# Patient Record
Sex: Female | Born: 1945 | Race: Black or African American | Hispanic: No | Smoking: Never smoker
Health system: Southern US, Community
[De-identification: ages and names within clinical notes are randomized; demographics above are authoritative.]

## PROBLEM LIST (undated history)

## (undated) DIAGNOSIS — N390 Urinary tract infection, site not specified: Secondary | ICD-10-CM

## (undated) DIAGNOSIS — R0989 Other specified symptoms and signs involving the circulatory and respiratory systems: Secondary | ICD-10-CM

## (undated) DIAGNOSIS — Z85528 Personal history of other malignant neoplasm of kidney: Secondary | ICD-10-CM

## (undated) DIAGNOSIS — M5126 Other intervertebral disc displacement, lumbar region: Secondary | ICD-10-CM

## (undated) DIAGNOSIS — M797 Fibromyalgia: Secondary | ICD-10-CM

## (undated) DIAGNOSIS — D649 Anemia, unspecified: Secondary | ICD-10-CM

## (undated) DIAGNOSIS — E669 Obesity, unspecified: Secondary | ICD-10-CM

## (undated) DIAGNOSIS — F319 Bipolar disorder, unspecified: Secondary | ICD-10-CM

## (undated) DIAGNOSIS — F209 Schizophrenia, unspecified: Secondary | ICD-10-CM

## (undated) DIAGNOSIS — C801 Malignant (primary) neoplasm, unspecified: Secondary | ICD-10-CM

## (undated) DIAGNOSIS — I5181 Takotsubo syndrome: Secondary | ICD-10-CM

## (undated) DIAGNOSIS — R011 Cardiac murmur, unspecified: Secondary | ICD-10-CM

## (undated) DIAGNOSIS — N200 Calculus of kidney: Secondary | ICD-10-CM

## (undated) DIAGNOSIS — F29 Unspecified psychosis not due to a substance or known physiological condition: Secondary | ICD-10-CM

## (undated) DIAGNOSIS — I10 Essential (primary) hypertension: Secondary | ICD-10-CM

## (undated) DIAGNOSIS — Z905 Acquired absence of kidney: Secondary | ICD-10-CM

## (undated) DIAGNOSIS — M1990 Unspecified osteoarthritis, unspecified site: Secondary | ICD-10-CM

## (undated) DIAGNOSIS — E785 Hyperlipidemia, unspecified: Secondary | ICD-10-CM

## (undated) DIAGNOSIS — K219 Gastro-esophageal reflux disease without esophagitis: Secondary | ICD-10-CM

## (undated) HISTORY — DX: Anemia, unspecified: D64.9

## (undated) HISTORY — DX: Essential (primary) hypertension: I10

## (undated) HISTORY — DX: Unspecified osteoarthritis, unspecified site: M19.90

## (undated) HISTORY — DX: Personal history of other malignant neoplasm of kidney: Z85.528

## (undated) HISTORY — PX: BREAST LUMPECTOMY: SHX2

## (undated) HISTORY — DX: Fibromyalgia: M79.7

## (undated) HISTORY — DX: Hyperlipidemia, unspecified: E78.5

## (undated) HISTORY — DX: Takotsubo syndrome: I51.81

## (undated) HISTORY — PX: CYSTECTOMY: SUR359

## (undated) HISTORY — DX: Obesity, unspecified: E66.9

## (undated) HISTORY — DX: Other specified symptoms and signs involving the circulatory and respiratory systems: R09.89

---

## 1989-02-16 HISTORY — PX: NEPHRECTOMY: SHX65

## 2003-06-19 HISTORY — PX: TOTAL KNEE ARTHROPLASTY: SHX125

## 2005-03-05 ENCOUNTER — Ambulatory Visit: Payer: Self-pay | Admitting: Internal Medicine

## 2005-03-07 ENCOUNTER — Ambulatory Visit: Payer: Self-pay | Admitting: Internal Medicine

## 2005-03-20 ENCOUNTER — Ambulatory Visit: Payer: Self-pay | Admitting: Family Medicine

## 2005-03-27 ENCOUNTER — Emergency Department (HOSPITAL_COMMUNITY): Admission: EM | Admit: 2005-03-27 | Discharge: 2005-03-27 | Payer: Self-pay | Admitting: Emergency Medicine

## 2005-03-27 ENCOUNTER — Ambulatory Visit: Payer: Self-pay | Admitting: *Deleted

## 2005-03-30 ENCOUNTER — Ambulatory Visit: Payer: Self-pay | Admitting: Internal Medicine

## 2005-04-02 ENCOUNTER — Ambulatory Visit (HOSPITAL_COMMUNITY): Admission: RE | Admit: 2005-04-02 | Discharge: 2005-04-02 | Payer: Self-pay | Admitting: Internal Medicine

## 2005-04-26 ENCOUNTER — Ambulatory Visit: Payer: Self-pay | Admitting: Internal Medicine

## 2005-09-05 ENCOUNTER — Ambulatory Visit: Payer: Self-pay | Admitting: Internal Medicine

## 2005-09-14 ENCOUNTER — Ambulatory Visit: Payer: Self-pay | Admitting: Internal Medicine

## 2005-09-25 ENCOUNTER — Encounter: Admission: RE | Admit: 2005-09-25 | Discharge: 2005-09-25 | Payer: Self-pay | Admitting: Internal Medicine

## 2005-10-02 ENCOUNTER — Ambulatory Visit: Payer: Self-pay | Admitting: Family Medicine

## 2005-10-08 ENCOUNTER — Ambulatory Visit: Payer: Self-pay | Admitting: Internal Medicine

## 2005-10-10 ENCOUNTER — Ambulatory Visit: Payer: Self-pay | Admitting: Internal Medicine

## 2005-10-11 ENCOUNTER — Ambulatory Visit: Payer: Self-pay | Admitting: Internal Medicine

## 2005-10-18 ENCOUNTER — Ambulatory Visit: Payer: Self-pay | Admitting: Internal Medicine

## 2005-11-01 ENCOUNTER — Inpatient Hospital Stay (HOSPITAL_COMMUNITY): Admission: EM | Admit: 2005-11-01 | Discharge: 2005-11-03 | Payer: Self-pay | Admitting: Emergency Medicine

## 2005-11-08 ENCOUNTER — Ambulatory Visit: Payer: Self-pay | Admitting: Internal Medicine

## 2005-11-19 ENCOUNTER — Ambulatory Visit: Payer: Self-pay | Admitting: Internal Medicine

## 2005-11-23 ENCOUNTER — Ambulatory Visit: Payer: Self-pay | Admitting: Internal Medicine

## 2005-11-27 ENCOUNTER — Ambulatory Visit (HOSPITAL_COMMUNITY): Admission: RE | Admit: 2005-11-27 | Discharge: 2005-11-27 | Payer: Self-pay | Admitting: Internal Medicine

## 2005-12-07 ENCOUNTER — Ambulatory Visit: Payer: Self-pay | Admitting: Internal Medicine

## 2005-12-17 ENCOUNTER — Ambulatory Visit: Payer: Self-pay | Admitting: Internal Medicine

## 2005-12-18 ENCOUNTER — Encounter: Admission: RE | Admit: 2005-12-18 | Discharge: 2006-01-02 | Payer: Self-pay | Admitting: Internal Medicine

## 2005-12-21 ENCOUNTER — Ambulatory Visit (HOSPITAL_COMMUNITY): Admission: RE | Admit: 2005-12-21 | Discharge: 2005-12-21 | Payer: Self-pay | Admitting: Internal Medicine

## 2006-01-01 ENCOUNTER — Ambulatory Visit: Payer: Self-pay | Admitting: Internal Medicine

## 2006-01-04 ENCOUNTER — Other Ambulatory Visit: Admission: RE | Admit: 2006-01-04 | Discharge: 2006-01-04 | Payer: Self-pay | Admitting: Internal Medicine

## 2006-01-04 ENCOUNTER — Ambulatory Visit: Payer: Self-pay | Admitting: Internal Medicine

## 2006-01-04 ENCOUNTER — Ambulatory Visit (HOSPITAL_COMMUNITY): Admission: RE | Admit: 2006-01-04 | Discharge: 2006-01-04 | Payer: Self-pay | Admitting: Internal Medicine

## 2006-01-07 ENCOUNTER — Ambulatory Visit (HOSPITAL_COMMUNITY): Admission: RE | Admit: 2006-01-07 | Discharge: 2006-01-07 | Payer: Self-pay | Admitting: Internal Medicine

## 2006-01-15 ENCOUNTER — Ambulatory Visit: Payer: Self-pay | Admitting: Internal Medicine

## 2006-01-23 ENCOUNTER — Emergency Department (HOSPITAL_COMMUNITY): Admission: EM | Admit: 2006-01-23 | Discharge: 2006-01-23 | Payer: Self-pay | Admitting: Emergency Medicine

## 2006-06-04 ENCOUNTER — Encounter: Admission: RE | Admit: 2006-06-04 | Discharge: 2006-06-28 | Payer: Self-pay | Admitting: Orthopedic Surgery

## 2006-06-13 ENCOUNTER — Emergency Department (HOSPITAL_COMMUNITY): Admission: EM | Admit: 2006-06-13 | Discharge: 2006-06-13 | Payer: Self-pay | Admitting: Emergency Medicine

## 2006-07-12 ENCOUNTER — Emergency Department (HOSPITAL_COMMUNITY): Admission: EM | Admit: 2006-07-12 | Discharge: 2006-07-12 | Payer: Self-pay | Admitting: Emergency Medicine

## 2006-09-11 ENCOUNTER — Encounter
Admission: RE | Admit: 2006-09-11 | Discharge: 2006-09-27 | Payer: Self-pay | Admitting: Physical Medicine and Rehabilitation

## 2007-04-30 ENCOUNTER — Emergency Department (HOSPITAL_COMMUNITY): Admission: EM | Admit: 2007-04-30 | Discharge: 2007-04-30 | Payer: Self-pay | Admitting: *Deleted

## 2007-05-15 ENCOUNTER — Emergency Department (HOSPITAL_COMMUNITY): Admission: EM | Admit: 2007-05-15 | Discharge: 2007-05-15 | Payer: Self-pay | Admitting: Emergency Medicine

## 2008-06-18 HISTORY — PX: CARDIAC CATHETERIZATION: SHX172

## 2009-03-03 ENCOUNTER — Inpatient Hospital Stay (HOSPITAL_COMMUNITY): Admission: EM | Admit: 2009-03-03 | Discharge: 2009-03-08 | Payer: Self-pay | Admitting: Emergency Medicine

## 2009-03-03 ENCOUNTER — Ambulatory Visit: Payer: Self-pay | Admitting: Cardiology

## 2009-03-04 ENCOUNTER — Encounter: Payer: Self-pay | Admitting: Cardiology

## 2009-03-07 ENCOUNTER — Encounter: Payer: Self-pay | Admitting: Cardiology

## 2009-03-07 ENCOUNTER — Ambulatory Visit: Payer: Self-pay | Admitting: Surgery

## 2009-03-10 ENCOUNTER — Encounter (INDEPENDENT_AMBULATORY_CARE_PROVIDER_SITE_OTHER): Payer: Self-pay | Admitting: *Deleted

## 2009-03-21 ENCOUNTER — Telehealth: Payer: Self-pay | Admitting: Cardiology

## 2009-03-22 ENCOUNTER — Ambulatory Visit: Payer: Self-pay | Admitting: Cardiology

## 2009-03-22 ENCOUNTER — Ambulatory Visit: Payer: Self-pay

## 2009-03-24 LAB — CONVERTED CEMR LAB
Basophils Absolute: 0.1 10*3/uL (ref 0.0–0.1)
Basophils Relative: 0.9 % (ref 0.0–3.0)
Eosinophils Absolute: 0.4 10*3/uL (ref 0.0–0.7)
Eosinophils Relative: 6.5 % — ABNORMAL HIGH (ref 0.0–5.0)
HCT: 25.7 % — ABNORMAL LOW (ref 36.0–46.0)
Hemoglobin: 8.3 g/dL — ABNORMAL LOW (ref 12.0–15.0)
Lymphocytes Relative: 35.2 % (ref 12.0–46.0)
Lymphs Abs: 2 10*3/uL (ref 0.7–4.0)
MCHC: 32.2 g/dL (ref 30.0–36.0)
MCV: 86.6 fL (ref 78.0–100.0)
Monocytes Absolute: 0.4 10*3/uL (ref 0.1–1.0)
Monocytes Relative: 6.2 % (ref 3.0–12.0)
Neutro Abs: 2.9 10*3/uL (ref 1.4–7.7)
Neutrophils Relative %: 51.2 % (ref 43.0–77.0)
Platelets: 315 10*3/uL (ref 150.0–400.0)
RBC: 2.96 M/uL — ABNORMAL LOW (ref 3.87–5.11)
RDW: 13 % (ref 11.5–14.6)
WBC: 5.8 10*3/uL (ref 4.5–10.5)

## 2009-04-12 DIAGNOSIS — R079 Chest pain, unspecified: Secondary | ICD-10-CM | POA: Insufficient documentation

## 2009-04-12 DIAGNOSIS — M1990 Unspecified osteoarthritis, unspecified site: Secondary | ICD-10-CM | POA: Insufficient documentation

## 2009-04-12 DIAGNOSIS — IMO0001 Reserved for inherently not codable concepts without codable children: Secondary | ICD-10-CM | POA: Insufficient documentation

## 2009-04-12 DIAGNOSIS — I10 Essential (primary) hypertension: Secondary | ICD-10-CM | POA: Insufficient documentation

## 2009-04-12 DIAGNOSIS — E785 Hyperlipidemia, unspecified: Secondary | ICD-10-CM | POA: Insufficient documentation

## 2009-04-12 DIAGNOSIS — J45909 Unspecified asthma, uncomplicated: Secondary | ICD-10-CM | POA: Insufficient documentation

## 2009-04-13 ENCOUNTER — Ambulatory Visit: Payer: Self-pay | Admitting: Cardiology

## 2009-04-13 DIAGNOSIS — I428 Other cardiomyopathies: Secondary | ICD-10-CM

## 2009-04-13 LAB — CONVERTED CEMR LAB
Basophils Absolute: 0 10*3/uL (ref 0.0–0.1)
Basophils Relative: 0.8 % (ref 0.0–3.0)
Eosinophils Absolute: 0.3 10*3/uL (ref 0.0–0.7)
Eosinophils Relative: 4.5 % (ref 0.0–5.0)
HCT: 30.6 % — ABNORMAL LOW (ref 36.0–46.0)
Hemoglobin: 10.1 g/dL — ABNORMAL LOW (ref 12.0–15.0)
Lymphocytes Relative: 38.1 % (ref 12.0–46.0)
Lymphs Abs: 2.1 10*3/uL (ref 0.7–4.0)
MCHC: 33.1 g/dL (ref 30.0–36.0)
MCV: 87.6 fL (ref 78.0–100.0)
Monocytes Absolute: 0.6 10*3/uL (ref 0.1–1.0)
Monocytes Relative: 10.2 % (ref 3.0–12.0)
Neutro Abs: 2.6 10*3/uL (ref 1.4–7.7)
Neutrophils Relative %: 46.4 % (ref 43.0–77.0)
Platelets: 315 10*3/uL (ref 150.0–400.0)
RBC: 3.5 M/uL — ABNORMAL LOW (ref 3.87–5.11)
RDW: 13.3 % (ref 11.5–14.6)
WBC: 5.6 10*3/uL (ref 4.5–10.5)

## 2009-05-27 ENCOUNTER — Telehealth (INDEPENDENT_AMBULATORY_CARE_PROVIDER_SITE_OTHER): Payer: Self-pay | Admitting: *Deleted

## 2009-06-04 ENCOUNTER — Emergency Department (HOSPITAL_COMMUNITY): Admission: EM | Admit: 2009-06-04 | Discharge: 2009-06-04 | Payer: Self-pay | Admitting: Emergency Medicine

## 2009-06-04 ENCOUNTER — Emergency Department (HOSPITAL_COMMUNITY): Admission: EM | Admit: 2009-06-04 | Discharge: 2009-06-04 | Payer: Self-pay | Admitting: Family Medicine

## 2009-06-06 ENCOUNTER — Emergency Department (HOSPITAL_COMMUNITY): Admission: EM | Admit: 2009-06-06 | Discharge: 2009-06-06 | Payer: Self-pay | Admitting: Emergency Medicine

## 2009-09-06 ENCOUNTER — Telehealth: Payer: Self-pay | Admitting: Cardiology

## 2009-09-14 ENCOUNTER — Ambulatory Visit: Payer: Self-pay | Admitting: Cardiology

## 2009-09-20 LAB — CONVERTED CEMR LAB
ALT: 23 units/L (ref 0–35)
AST: 24 units/L (ref 0–37)
Albumin: 4.2 g/dL (ref 3.5–5.2)
Alkaline Phosphatase: 75 units/L (ref 39–117)
Bilirubin, Direct: 0.1 mg/dL (ref 0.0–0.3)
Cholesterol: 211 mg/dL — ABNORMAL HIGH (ref 0–200)
Direct LDL: 140.9 mg/dL
HDL: 64.8 mg/dL (ref >39.00)
Pro B Natriuretic peptide (BNP): 15 pg/mL (ref 0.0–100.0)
Total Bilirubin: 0.7 mg/dL (ref 0.3–1.2)
Total CHOL/HDL Ratio: 3
Total Protein: 7.6 g/dL (ref 6.0–8.3)
Triglycerides: 70 mg/dL (ref 0.0–149.0)
VLDL: 14 mg/dL (ref 0.0–40.0)

## 2009-10-05 ENCOUNTER — Ambulatory Visit: Payer: Self-pay

## 2009-10-05 ENCOUNTER — Encounter: Payer: Self-pay | Admitting: Cardiology

## 2009-10-05 ENCOUNTER — Ambulatory Visit: Payer: Self-pay | Admitting: Cardiology

## 2009-10-05 ENCOUNTER — Ambulatory Visit (HOSPITAL_COMMUNITY): Admission: RE | Admit: 2009-10-05 | Discharge: 2009-10-05 | Payer: Self-pay | Admitting: Cardiology

## 2009-11-22 ENCOUNTER — Emergency Department (HOSPITAL_COMMUNITY): Admission: EM | Admit: 2009-11-22 | Discharge: 2009-11-22 | Payer: Self-pay | Admitting: Family Medicine

## 2009-11-24 ENCOUNTER — Emergency Department (HOSPITAL_COMMUNITY): Admission: EM | Admit: 2009-11-24 | Discharge: 2009-11-25 | Payer: Self-pay | Admitting: Emergency Medicine

## 2009-11-26 ENCOUNTER — Emergency Department (HOSPITAL_COMMUNITY): Admission: EM | Admit: 2009-11-26 | Discharge: 2009-11-26 | Payer: Self-pay | Admitting: Emergency Medicine

## 2009-12-05 ENCOUNTER — Emergency Department (HOSPITAL_COMMUNITY): Admission: EM | Admit: 2009-12-05 | Discharge: 2009-12-05 | Payer: Self-pay | Admitting: Emergency Medicine

## 2010-04-25 ENCOUNTER — Observation Stay (HOSPITAL_COMMUNITY): Admission: EM | Admit: 2010-04-25 | Discharge: 2010-04-26 | Payer: Self-pay | Admitting: Emergency Medicine

## 2010-04-26 ENCOUNTER — Ambulatory Visit: Payer: Self-pay | Admitting: Vascular Surgery

## 2010-04-26 ENCOUNTER — Encounter (INDEPENDENT_AMBULATORY_CARE_PROVIDER_SITE_OTHER): Payer: Self-pay | Admitting: Emergency Medicine

## 2010-07-08 ENCOUNTER — Encounter: Payer: Self-pay | Admitting: Family Medicine

## 2010-07-09 ENCOUNTER — Encounter: Payer: Self-pay | Admitting: Internal Medicine

## 2010-07-18 NOTE — Letter (Signed)
Summary: Generic Letter  Architectural technologist, Main Office  [address]   [place], [place] [address]   Phone: [phone]  Fax: [phone]        September 14, 2009 MRN: [id number]    Heidi Young [address] [place], [place]  [address]    To Whom It May Concern:  [name] can participate in exercise at the [facility].     Sincerely,    Treg Diemer,MD  This letter has been electronically signed by your physician.

## 2010-07-18 NOTE — Assessment & Plan Note (Signed)
Summary: 4 MMONTH/DMP   Allergies: No Known Drug Allergies

## 2010-07-18 NOTE — Assessment & Plan Note (Signed)
Summary: 4 MMONTH/DMP   Primary Provider:  Dr. Concepcion Elk  CC:  4 month follow up.  Pt feels she may be retaining fluid. Pt states she does not smoke cigarettes but does smoke marijuana.  .  History of Present Illness: 65 yo with history of HTN and hyperlipidemia presented to [facility] in 9/10 with an episode of chest pain.  She had an episode of exertional chest pressure lasting 20-30 minutes while walking with her grandson.  She came to the ER and had one elevated set of cardiac enzymes with troponin 0.09.  Echo showed an apical wall motion abnormality with EF 45-50%.  Patient had a left heart cath, but this showed no obstructive CAD but the apical wall motion abnormality was noted.  It was thought that this might be a  Takotsubo-type stress cardiomyopathy.   Since then, patient has done fairly well.  She walks for exercise, up to 1.5 hours a day.  She does not get short of breath walking on flat ground.  She is able to climb steps without trouble.  No chest pain.  She will take Lasix occasionally if she feels her ankles are swelling or if she feels a little short of breath.  She has not taken it in 2 weeks.  She has been changed from olmesartan to amlodipine.  Weight is down 1 lb since prior appointment.   Labs (9/10): creatinine 1.29, LDL 145, HDL 58, HCT 25.7  Current Medications (verified): 1)  Lasix 20 Mg Tabs (Furosemide) .... On Hold 2)  Potassium Chloride Cr 10 Meq Cr-Tabs (Potassium Chloride) .... On Hold 3)  Flexeril 10 Mg Tabs (Cyclobenzaprine Hcl) .... As Needed 4)  Tramadol Hcl 50 Mg Tabs (Tramadol Hcl) .Marland Kitchen.. 1 Tab Two Times A Day As Needed 5)  Ventolin Hfa 108 (90 Base) Mcg/act Aers (Albuterol Sulfate) .... As Directed 6)  Advair Diskus 100-50 Mcg/dose Aepb (Fluticasone-Salmeterol) .... As Directed-On Hold 7)  Coreg 6.25 Mg Tabs (Carvedilol) .Marland Kitchen.. 1 Tablet Twice A Day 8)  Pravastatin Sodium 40 Mg Tabs (Pravastatin Sodium) .Marland Kitchen.. 1 Tab Once Daily 9)  Aspirin 81 Mg Tbec (Aspirin) ....  Take One Tablet By Mouth Daily 10)  Amlodipine Besylate 5 Mg Tabs (Amlodipine Besylate) .... Take One Tablet Once Daily  Allergies (verified): No Known Drug Allergies  Past History:  Past Medical History: 1. Chest pain: LHC (9/10) with no significant obstructive CAD, EF 55%, mild apical hypokinesis.  2. Mild cardiomyopathy: echo (9/10) showed EF 45-50% with distal septal and apical hypokinesis.  Mild LAE, PASP 34 mmHg.  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  3. HTN 4. Hyperlipidemia 5. Asthma (takes Advair) 6. Fibromyalgia 7. DJD 8. Chronic anemia 9. 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  Obesity 11.  Left carotid bruit: carotid dopplers 9/10 at [facility] showed minimal disease.     Family History: Reviewed history from 04/13/2009 and no changes required. Mother deceased at age 69, positive history of CAD that started in her 19s.  She also had congestive heart failure and history of CVA.  Father deceased at age 20 from brain tumor.  No known history of CAD.  She has several siblings, all of which have hypertension and 1 sibling with aortic stenosis, but no known history of CAD.      Social History: Reviewed history from 04/13/2009 and no changes required. The patient lives in [place] with her 75 year old grandson.  She is on disability.  She has no significant smoking, EtOH, or illicit drug use history except  for smoking marijuana approximately 1  time per week due to her chronic pain.  She has no other significant  herbal medication use.  She has a regular diet and exercises as described in HPI.   Review of Systems       All systems reviewed and negative except as per HPI.   Vital Signs:  Patient profile:   65 year old female Height:      67 inches Weight:      190 pounds BMI:     29.87 Pulse rate:   92 / minute Pulse rhythm:   regular BP sitting:   102 / 72  (left arm) Cuff size:   large  Vitals Entered By: Judithe Modest CMA (September 14, 2009 8:41  AM)  Physical Exam  General:  Well developed, well nourished, in no acute distress. Neck:  Neck supple, no JVD. No masses, thyromegaly or abnormal cervical nodes. Lungs:  Clear bilaterally to auscultation and percussion. Heart:  Non-displaced PMI, chest non-tender; regular rate and rhythm, S1, S2 without murmurs, rubs or gallops. Carotid upstroke normal, left carotid bruit.  Pedals normal pulses. No edema, no varicosities. Abdomen:  Bowel sounds positive; abdomen soft and non-tender without masses, organomegaly, or hernias noted. No hepatosplenomegaly. Extremities:  No clubbing or cyanosis. Neurologic:  Alert and oriented x 3. Psych:  Normal affect.   Impression & Recommendations:  Problem # 1:  CARDIOMYOPATHY (ICD-425.4) No obstructive CAD on cath with apical wall motion abnormality.  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  She has been taken off ARB and is on amlodipine.  She continues on Coreg.  I will get an echocardiogram.  If EF is back to normal, continue current meds.  If EF is still low, will put her back on an ARB.  She appears euvolemic on exam.  No need for standing Lasix (can use as needed).  Will check BNP today.   Problem # 2:  HYPERLIPIDEMIA (ICD-272.4) Lipids/LFTs on pravastatin today.   Problem # 3:  CAROTID BRUIT No significant disease on carotid dopplers in 9/10.   Other Orders: TLB-BNP (B-Natriuretic Peptide) (83880-BNPR) TLB-Hepatic/Liver Function Pnl (80076-HEPATIC) TLB-Lipid Panel (80061-LIPID) Echocardiogram (Echo)  Patient Instructions: 1)  Your physician recommends that you have  FASTING lipid profile/liver profile/BNP today  428.0 272.0 2)  Your physician has requested that you have an echocardiogram.  Echocardiography is a painless test that uses sound waves to create images of your heart. It provides your doctor with information about the size and shape of your heart and how well your heart's chambers and valves are working.  This procedure takes  approximately one hour. There are no restrictions for this procedure. 3)  Your physician wants you to follow-up in: 6 months with Dr Shirlee Latch.  You will receive a reminder letter in the mail two months in advance. If you don't receive a letter, please call our office to schedule the follow-up appointment.

## 2010-07-18 NOTE — Progress Notes (Signed)
Summary: pt got call regarding getting lab work done  Phone Note Call from Patient Call back at [employer] [phone]   Caller: Patient Reason for Call: Talk to Nurse, Talk to Doctor Summary of Call: per pt call she recieved a call yesterday to obtain some lab work to check her H&H but she had some done at her pcp 79month ago and it has been fine. Initial call taken by: Omer Jack,  September 06, 2009 1:19 PM  Follow-up for Phone Call        talked with patient--pt states she is returning a call about lab--I did not call patient

## 2010-08-14 ENCOUNTER — Other Ambulatory Visit: Payer: Self-pay | Admitting: Internal Medicine

## 2010-08-14 DIAGNOSIS — Z1231 Encounter for screening mammogram for malignant neoplasm of breast: Secondary | ICD-10-CM

## 2010-08-28 ENCOUNTER — Ambulatory Visit: Payer: Self-pay

## 2010-08-29 LAB — CBC
Hemoglobin: 10.5 g/dL — ABNORMAL LOW (ref 12.0–15.0)
MCH: 26.9 pg (ref 26.0–34.0)
RBC: 3.91 MIL/uL (ref 3.87–5.11)

## 2010-08-29 LAB — DIFFERENTIAL
Eosinophils Absolute: 0.3 10*3/uL (ref 0.0–0.7)
Lymphs Abs: 2 10*3/uL (ref 0.7–4.0)
Monocytes Relative: 10 % (ref 3–12)
Neutrophils Relative %: 51 % (ref 43–77)

## 2010-08-29 LAB — BASIC METABOLIC PANEL
CO2: 25 mEq/L (ref 19–32)
Calcium: 9.3 mg/dL (ref 8.4–10.5)
Chloride: 108 mEq/L (ref 96–112)
GFR calc Af Amer: 59 mL/min — ABNORMAL LOW (ref >60)
Potassium: 3.5 mEq/L (ref 3.5–5.1)
Sodium: 140 mEq/L (ref 135–145)

## 2010-09-04 LAB — URINALYSIS, ROUTINE W REFLEX MICROSCOPIC
Bilirubin Urine: NEGATIVE
Glucose, UA: NEGATIVE mg/dL
Nitrite: NEGATIVE
Protein, ur: NEGATIVE mg/dL

## 2010-09-04 LAB — CBC
HCT: 36.5 % (ref 36.0–46.0)
Hemoglobin: 11.7 g/dL — ABNORMAL LOW (ref 12.0–15.0)
MCHC: 32.1 g/dL (ref 30.0–36.0)
MCV: 85.1 fL (ref 78.0–100.0)
RBC: 4.29 MIL/uL (ref 3.87–5.11)

## 2010-09-04 LAB — BASIC METABOLIC PANEL
CO2: 23 mEq/L (ref 19–32)
Chloride: 107 mEq/L (ref 96–112)
Creatinine, Ser: 1.15 mg/dL (ref 0.4–1.2)
GFR calc Af Amer: 57 mL/min — ABNORMAL LOW (ref >60)
Sodium: 140 mEq/L (ref 135–145)

## 2010-09-04 LAB — DIFFERENTIAL
Basophils Relative: 1 % (ref 0–1)
Eosinophils Absolute: 0.2 10*3/uL (ref 0.0–0.7)
Monocytes Absolute: 0.6 10*3/uL (ref 0.1–1.0)
Monocytes Relative: 9 % (ref 3–12)

## 2010-09-04 LAB — POCT CARDIAC MARKERS
Myoglobin, poc: 160 ng/mL (ref 12–200)
Troponin i, poc: <0.05 ng/mL (ref 0.00–0.09)

## 2010-09-04 LAB — RAPID URINE DRUG SCREEN, HOSP PERFORMED
Benzodiazepines: NOT DETECTED
Tetrahydrocannabinol: POSITIVE — AB

## 2010-09-18 LAB — COMPREHENSIVE METABOLIC PANEL
AST: 40 U/L — ABNORMAL HIGH (ref 0–37)
Albumin: 3.8 g/dL (ref 3.5–5.2)
Alkaline Phosphatase: 61 U/L (ref 39–117)
BUN: 12 mg/dL (ref 6–23)
CO2: 27 mEq/L (ref 19–32)
Chloride: 109 mEq/L (ref 96–112)
Creatinine, Ser: 1.16 mg/dL (ref 0.4–1.2)
GFR calc Af Amer: 57 mL/min — ABNORMAL LOW (ref >60)
GFR calc non Af Amer: 47 mL/min — ABNORMAL LOW (ref >60)
Potassium: 3.3 mEq/L — ABNORMAL LOW (ref 3.5–5.1)
Total Bilirubin: 1 mg/dL (ref 0.3–1.2)

## 2010-09-18 LAB — CBC
HCT: 35.3 % — ABNORMAL LOW (ref 36.0–46.0)
Hemoglobin: 11.7 g/dL — ABNORMAL LOW (ref 12.0–15.0)
MCHC: 32.6 g/dL (ref 30.0–36.0)
MCV: 84.2 fL (ref 78.0–100.0)
Platelets: 321 10*3/uL (ref 150–400)
RBC: 4.19 MIL/uL (ref 3.87–5.11)
RBC: 4.28 MIL/uL (ref 3.87–5.11)
WBC: 5.2 10*3/uL (ref 4.0–10.5)
WBC: 6.1 10*3/uL (ref 4.0–10.5)

## 2010-09-18 LAB — DIFFERENTIAL
Basophils Absolute: 0.1 10*3/uL (ref 0.0–0.1)
Basophils Relative: 1 % (ref 0–1)
Basophils Relative: 2 % — ABNORMAL HIGH (ref 0–1)
Eosinophils Relative: 3 % (ref 0–5)
Lymphocytes Relative: 28 % (ref 12–46)
Lymphocytes Relative: 38 % (ref 12–46)
Lymphs Abs: 2 10*3/uL (ref 0.7–4.0)
Monocytes Absolute: 0.4 10*3/uL (ref 0.1–1.0)
Monocytes Absolute: 0.4 10*3/uL (ref 0.1–1.0)
Monocytes Relative: 8 % (ref 3–12)
Neutro Abs: 2.5 10*3/uL (ref 1.7–7.7)
Neutrophils Relative %: 49 % (ref 43–77)

## 2010-09-18 LAB — HEMOCCULT GUIAC POC 1CARD (OFFICE): Fecal Occult Bld: NEGATIVE

## 2010-09-18 LAB — LACTIC ACID, PLASMA: Lactic Acid, Venous: 1 mmol/L (ref 0.5–2.2)

## 2010-09-18 LAB — POCT I-STAT, CHEM 8
BUN: 20 mg/dL (ref 6–23)
Chloride: 110 mEq/L (ref 96–112)
Creatinine, Ser: 1.2 mg/dL (ref 0.4–1.2)
Creatinine, Ser: 1.2 mg/dL (ref 0.4–1.2)
Glucose, Bld: 86 mg/dL (ref 70–99)
Hemoglobin: 12.6 g/dL (ref 12.0–15.0)
Hemoglobin: 13.3 g/dL (ref 12.0–15.0)
Potassium: 3.5 mEq/L (ref 3.5–5.1)
Potassium: 3.8 mEq/L (ref 3.5–5.1)
Sodium: 142 mEq/L (ref 135–145)
Sodium: 143 mEq/L (ref 135–145)

## 2010-09-18 LAB — URINALYSIS, ROUTINE W REFLEX MICROSCOPIC
Bilirubin Urine: NEGATIVE
Glucose, UA: NEGATIVE mg/dL
Hgb urine dipstick: NEGATIVE
Specific Gravity, Urine: 1.018 (ref 1.005–1.030)
pH: 6.5 (ref 5.0–8.0)

## 2010-09-18 LAB — LIPASE, BLOOD: Lipase: 22 U/L (ref 11–59)

## 2010-09-22 LAB — CBC
HCT: 28.7 % — ABNORMAL LOW (ref 36.0–46.0)
HCT: 30.2 % — ABNORMAL LOW (ref 36.0–46.0)
Hemoglobin: 9.1 g/dL — ABNORMAL LOW (ref 12.0–15.0)
Hemoglobin: 9.3 g/dL — ABNORMAL LOW (ref 12.0–15.0)
Hemoglobin: 9.6 g/dL — ABNORMAL LOW (ref 12.0–15.0)
MCHC: 32.3 g/dL (ref 30.0–36.0)
MCHC: 32.6 g/dL (ref 30.0–36.0)
MCV: 85.7 fL (ref 78.0–100.0)
MCV: 86.7 fL (ref 78.0–100.0)
MCV: 87.1 fL (ref 78.0–100.0)
Platelets: 280 10*3/uL (ref 150–400)
Platelets: 306 10*3/uL (ref 150–400)
Platelets: 309 10*3/uL (ref 150–400)
RBC: 3.24 MIL/uL — ABNORMAL LOW (ref 3.87–5.11)
RDW: 13.5 % (ref 11.5–15.5)
RDW: 13.7 % (ref 11.5–15.5)
RDW: 14 % (ref 11.5–15.5)
WBC: 5.1 10*3/uL (ref 4.0–10.5)
WBC: 6 10*3/uL (ref 4.0–10.5)
WBC: 6.3 10*3/uL (ref 4.0–10.5)
WBC: 6.4 10*3/uL (ref 4.0–10.5)

## 2010-09-22 LAB — LIPID PANEL
HDL: 58 mg/dL (ref >39)
Total CHOL/HDL Ratio: 3.7 RATIO
VLDL: 11 mg/dL (ref 0–40)

## 2010-09-22 LAB — HEPATIC FUNCTION PANEL
AST: 22 U/L (ref 0–37)
Albumin: 4 g/dL (ref 3.5–5.2)
Alkaline Phosphatase: 58 U/L (ref 39–117)
Total Bilirubin: 0.7 mg/dL (ref 0.3–1.2)

## 2010-09-22 LAB — POCT CARDIAC MARKERS
Myoglobin, poc: 153 ng/mL (ref 12–200)
Troponin i, poc: <0.05 ng/mL (ref 0.00–0.09)

## 2010-09-22 LAB — BASIC METABOLIC PANEL
BUN: 23 mg/dL (ref 6–23)
BUN: 23 mg/dL (ref 6–23)
CO2: 24 mEq/L (ref 19–32)
Calcium: 9.4 mg/dL (ref 8.4–10.5)
Calcium: 9.7 mg/dL (ref 8.4–10.5)
Chloride: 108 mEq/L (ref 96–112)
Chloride: 109 mEq/L (ref 96–112)
Creatinine, Ser: 1.25 mg/dL — ABNORMAL HIGH (ref 0.4–1.2)
Creatinine, Ser: 1.29 mg/dL — ABNORMAL HIGH (ref 0.4–1.2)
GFR calc Af Amer: 49 mL/min — ABNORMAL LOW (ref >60)
GFR calc Af Amer: 59 mL/min — ABNORMAL LOW (ref >60)
GFR calc non Af Amer: 40 mL/min — ABNORMAL LOW (ref >60)
GFR calc non Af Amer: 49 mL/min — ABNORMAL LOW (ref >60)
Glucose, Bld: 132 mg/dL — ABNORMAL HIGH (ref 70–99)
Glucose, Bld: 99 mg/dL (ref 70–99)
Potassium: 4.5 mEq/L (ref 3.5–5.1)
Sodium: 140 mEq/L (ref 135–145)
Sodium: 140 mEq/L (ref 135–145)
Sodium: 142 mEq/L (ref 135–145)

## 2010-09-22 LAB — URINALYSIS, ROUTINE W REFLEX MICROSCOPIC
Nitrite: NEGATIVE
Specific Gravity, Urine: 1.018 (ref 1.005–1.030)
pH: 5.5 (ref 5.0–8.0)

## 2010-09-22 LAB — RETICULOCYTES
RBC.: 3.75 MIL/uL — ABNORMAL LOW (ref 3.87–5.11)
Retic Count, Absolute: 78.8 10*3/uL (ref 19.0–186.0)
Retic Ct Pct: 2.1 % (ref 0.4–3.1)

## 2010-09-22 LAB — IRON AND TIBC: Iron: 63 ug/dL (ref 42–135)

## 2010-09-22 LAB — CARDIAC PANEL(CRET KIN+CKTOT+MB+TROPI)
CK, MB: 2 ng/mL (ref 0.3–4.0)
Relative Index: 7.6 — ABNORMAL HIGH (ref 0.0–2.5)
Total CK: 271 U/L — ABNORMAL HIGH (ref 7–177)
Total CK: 304 U/L — ABNORMAL HIGH (ref 7–177)
Troponin I: 0.01 ng/mL (ref 0.00–0.06)
Troponin I: 0.09 ng/mL — ABNORMAL HIGH (ref 0.00–0.06)

## 2010-09-22 LAB — DIFFERENTIAL
Eosinophils Relative: 4 % (ref 0–5)
Lymphocytes Relative: 27 % (ref 12–46)
Lymphs Abs: 1.6 10*3/uL (ref 0.7–4.0)

## 2010-09-22 LAB — FOLATE: Folate: 16 ng/mL

## 2010-09-22 LAB — FERRITIN: Ferritin: 186 ng/mL (ref 10–291)

## 2010-09-22 LAB — APTT: aPTT: 31 seconds (ref 24–37)

## 2010-10-10 ENCOUNTER — Emergency Department (HOSPITAL_COMMUNITY)
Admission: EM | Admit: 2010-10-10 | Discharge: 2010-10-10 | Discharge status: Against Medical Advice | Payer: Medicare Other | Attending: Emergency Medicine | Admitting: Emergency Medicine

## 2010-10-10 ENCOUNTER — Emergency Department (HOSPITAL_COMMUNITY)
Admission: EM | Admit: 2010-10-10 | Discharge: 2010-10-10 | Disposition: A | Discharge status: Home / Self Care | Payer: Medicare Other | Attending: Emergency Medicine | Admitting: Emergency Medicine

## 2010-10-10 ENCOUNTER — Emergency Department (HOSPITAL_COMMUNITY): Payer: Medicare Other

## 2010-10-10 DIAGNOSIS — I10 Essential (primary) hypertension: Secondary | ICD-10-CM | POA: Insufficient documentation

## 2010-10-10 DIAGNOSIS — R0609 Other forms of dyspnea: Secondary | ICD-10-CM | POA: Insufficient documentation

## 2010-10-10 DIAGNOSIS — J45909 Unspecified asthma, uncomplicated: Secondary | ICD-10-CM | POA: Insufficient documentation

## 2010-10-10 DIAGNOSIS — E7800 Pure hypercholesterolemia, unspecified: Secondary | ICD-10-CM | POA: Insufficient documentation

## 2010-10-10 DIAGNOSIS — M542 Cervicalgia: Secondary | ICD-10-CM | POA: Insufficient documentation

## 2010-10-10 DIAGNOSIS — R0602 Shortness of breath: Secondary | ICD-10-CM | POA: Insufficient documentation

## 2010-10-10 DIAGNOSIS — R609 Edema, unspecified: Secondary | ICD-10-CM | POA: Insufficient documentation

## 2010-10-10 DIAGNOSIS — Z85528 Personal history of other malignant neoplasm of kidney: Secondary | ICD-10-CM | POA: Insufficient documentation

## 2010-10-10 DIAGNOSIS — R0989 Other specified symptoms and signs involving the circulatory and respiratory systems: Secondary | ICD-10-CM | POA: Insufficient documentation

## 2010-10-10 DIAGNOSIS — R000 Tachycardia, unspecified: Secondary | ICD-10-CM | POA: Insufficient documentation

## 2010-10-10 DIAGNOSIS — M79609 Pain in unspecified limb: Secondary | ICD-10-CM | POA: Insufficient documentation

## 2010-10-10 DIAGNOSIS — M549 Dorsalgia, unspecified: Secondary | ICD-10-CM | POA: Insufficient documentation

## 2010-10-10 DIAGNOSIS — L02419 Cutaneous abscess of limb, unspecified: Secondary | ICD-10-CM | POA: Insufficient documentation

## 2010-10-10 DIAGNOSIS — L03119 Cellulitis of unspecified part of limb: Secondary | ICD-10-CM | POA: Insufficient documentation

## 2010-10-10 DIAGNOSIS — R109 Unspecified abdominal pain: Secondary | ICD-10-CM | POA: Insufficient documentation

## 2010-10-10 DIAGNOSIS — R21 Rash and other nonspecific skin eruption: Secondary | ICD-10-CM | POA: Insufficient documentation

## 2010-10-10 DIAGNOSIS — Z79899 Other long term (current) drug therapy: Secondary | ICD-10-CM | POA: Insufficient documentation

## 2010-10-10 DIAGNOSIS — R079 Chest pain, unspecified: Secondary | ICD-10-CM | POA: Insufficient documentation

## 2010-10-10 DIAGNOSIS — IMO0002 Reserved for concepts with insufficient information to code with codable children: Secondary | ICD-10-CM | POA: Insufficient documentation

## 2010-10-10 LAB — CBC
HCT: 32.6 % — ABNORMAL LOW (ref 36.0–46.0)
MCH: 27.4 pg (ref 26.0–34.0)
MCHC: 32.8 g/dL (ref 30.0–36.0)
MCV: 83.6 fL (ref 78.0–100.0)
RBC: 3.9 MIL/uL (ref 3.87–5.11)
WBC: 6.1 10*3/uL (ref 4.0–10.5)

## 2010-10-10 LAB — COMPREHENSIVE METABOLIC PANEL
ALT: 25 U/L (ref 0–35)
Alkaline Phosphatase: 59 U/L (ref 39–117)
Glucose, Bld: 85 mg/dL (ref 70–99)
Potassium: 3.8 mEq/L (ref 3.5–5.1)
Sodium: 138 mEq/L (ref 135–145)
Total Protein: 7.2 g/dL (ref 6.0–8.3)

## 2010-10-10 LAB — URINALYSIS, ROUTINE W REFLEX MICROSCOPIC
Glucose, UA: NEGATIVE mg/dL
Hgb urine dipstick: NEGATIVE
Protein, ur: NEGATIVE mg/dL
Specific Gravity, Urine: 1.016 (ref 1.005–1.030)

## 2010-10-10 LAB — DIFFERENTIAL
Basophils Absolute: 0 10*3/uL (ref 0.0–0.1)
Basophils Relative: 1 % (ref 0–1)
Eosinophils Absolute: 0.1 10*3/uL (ref 0.0–0.7)
Eosinophils Relative: 2 % (ref 0–5)
Neutrophils Relative %: 65 % (ref 43–77)

## 2010-10-10 LAB — POCT I-STAT 3, ART BLOOD GAS (G3+)
Acid-base deficit: 5 mmol/L — ABNORMAL HIGH (ref 0.0–2.0)
O2 Saturation: 98 %
pO2, Arterial: 101 mmHg — ABNORMAL HIGH (ref 80.0–100.0)

## 2010-10-10 LAB — RAPID URINE DRUG SCREEN, HOSP PERFORMED
Amphetamines: NOT DETECTED
Barbiturates: NOT DETECTED
Benzodiazepines: NOT DETECTED
Cocaine: NOT DETECTED
Opiates: NOT DETECTED

## 2010-10-10 LAB — GLUCOSE, CAPILLARY: Glucose-Capillary: 87 mg/dL (ref 70–99)

## 2010-10-10 LAB — POCT I-STAT, CHEM 8
Creatinine, Ser: 1.3 mg/dL — ABNORMAL HIGH (ref 0.4–1.2)
Glucose, Bld: 84 mg/dL (ref 70–99)
Hemoglobin: 11.9 g/dL — ABNORMAL LOW (ref 12.0–15.0)
Potassium: 3.5 mEq/L (ref 3.5–5.1)
TCO2: 20 mmol/L (ref 0–100)

## 2010-10-10 LAB — ETHANOL: Alcohol, Ethyl (B): <5 mg/dL (ref 0–10)

## 2010-10-10 LAB — POCT CARDIAC MARKERS: CKMB, poc: 3.2 ng/mL (ref 1.0–8.0)

## 2010-10-11 ENCOUNTER — Emergency Department (HOSPITAL_COMMUNITY): Payer: Medicare Other

## 2010-10-11 ENCOUNTER — Emergency Department (HOSPITAL_COMMUNITY)
Admission: EM | Admit: 2010-10-11 | Discharge: 2010-10-12 | Disposition: A | Discharge status: Home / Self Care | Payer: Medicare Other | Attending: Emergency Medicine | Admitting: Emergency Medicine

## 2010-10-11 DIAGNOSIS — I10 Essential (primary) hypertension: Secondary | ICD-10-CM | POA: Insufficient documentation

## 2010-10-11 DIAGNOSIS — E7800 Pure hypercholesterolemia, unspecified: Secondary | ICD-10-CM | POA: Insufficient documentation

## 2010-10-11 DIAGNOSIS — J45909 Unspecified asthma, uncomplicated: Secondary | ICD-10-CM | POA: Insufficient documentation

## 2010-10-11 DIAGNOSIS — IMO0001 Reserved for inherently not codable concepts without codable children: Secondary | ICD-10-CM | POA: Insufficient documentation

## 2010-10-11 DIAGNOSIS — F29 Unspecified psychosis not due to a substance or known physiological condition: Secondary | ICD-10-CM | POA: Insufficient documentation

## 2010-10-11 LAB — AMMONIA: Ammonia: 37 umol/L — ABNORMAL HIGH (ref 11–35)

## 2010-10-11 LAB — DIFFERENTIAL
Basophils Absolute: 0 10*3/uL (ref 0.0–0.1)
Eosinophils Absolute: 0.1 10*3/uL (ref 0.0–0.7)
Lymphocytes Relative: 33 % (ref 12–46)
Lymphs Abs: 1.7 10*3/uL (ref 0.7–4.0)
Neutrophils Relative %: 51 % (ref 43–77)

## 2010-10-11 LAB — COMPREHENSIVE METABOLIC PANEL
Albumin: 4.3 g/dL (ref 3.5–5.2)
BUN: 7 mg/dL (ref 6–23)
Creatinine, Ser: 1.07 mg/dL (ref 0.4–1.2)
Total Bilirubin: 1 mg/dL (ref 0.3–1.2)
Total Protein: 7.5 g/dL (ref 6.0–8.3)

## 2010-10-11 LAB — CBC
MCV: 83.4 fL (ref 78.0–100.0)
Platelets: 265 10*3/uL (ref 150–400)
RBC: 4.04 MIL/uL (ref 3.87–5.11)
WBC: 5.3 10*3/uL (ref 4.0–10.5)

## 2010-10-11 LAB — URINALYSIS, ROUTINE W REFLEX MICROSCOPIC
Hgb urine dipstick: NEGATIVE
Specific Gravity, Urine: 1.01 (ref 1.005–1.030)
Urobilinogen, UA: 0.2 mg/dL (ref 0.0–1.0)

## 2010-10-11 LAB — ETHANOL: Alcohol, Ethyl (B): <5 mg/dL (ref 0–10)

## 2010-10-11 LAB — RAPID URINE DRUG SCREEN, HOSP PERFORMED
Amphetamines: NOT DETECTED
Barbiturates: NOT DETECTED

## 2010-10-11 LAB — POCT PREGNANCY, URINE: Preg Test, Ur: NEGATIVE

## 2010-10-16 LAB — CULTURE, BLOOD (ROUTINE X 2): Culture: NO GROWTH

## 2010-10-20 ENCOUNTER — Encounter: Payer: Self-pay | Admitting: Cardiology

## 2010-10-24 ENCOUNTER — Ambulatory Visit: Payer: Self-pay | Admitting: Cardiology

## 2010-11-03 NOTE — H&P (Signed)
NAME:  Heidi Young, Heidi Young NO.:  [id number]   MEDICAL RECORD NO.:  [id number]          PATIENT TYPE:  EMS   LOCATION:  ED                           FACILITY:  [facility]   PHYSICIAN:  Hettie Holstein, D.O.    DATE OF BIRTH:  [birth year]   DATE OF ADMISSION:  10/31/2005  DATE OF DISCHARGE:                                HISTORY & PHYSICAL   PRIMARY CARE PHYSICIAN:  Heidi Young, M.D. at [facility].   HISTORY OF PRESENT ILLNESS:  Ms. Heidi Young presented to [facility] emergency department by the [facility] Department after  there were concerns for her behavior and psychiatric issues.  She was  involuntarily committed at 1730 on Oct 31, 2005, as she was quite confused,  combative, and poorly compliant initially in the emergency department.  It  is apparent that she has had quite a great deal of stressors over the recent  weeks.  According to the records, there is no known prior suicidal or  homicidal ideations at this time.  However, the patient was quite confused.  Around this time last year, she had lost one of her sons.  In addition, this  past Mother's Day, she was not contacted by her other son and apparently in  the context of her living situation which is having been recently evicted  from her home, she has been under a great deal of stress.  The police  department upon investigation of her home, found it in a mess with her  utilities having been turned off about a week ago.  Apparently, she cares  for an 33-year-old grandson that was currently with DSS.  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  She  required restraints in the emergency department and referral to [facility]  is anticipated to be under way, however, initially she was noted to have  some lab abnormalities including an elevated total CPK and creatinine of 2.4  and a potassium of 2.6.   Upon interview of Ms. Heidi Young, she remained  quite withdrawn.  She required  a dose of Geodon earlier at 20 mg in the department and her history  currently is unreliable.  She denies crack cocaine, tobacco, IV drugs, and  even marijuana which was not apparently consistent due to this being  positive on her urine screen.  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  She is unable to  provide further details.   PAST MEDICAL HISTORY:  Significant for hypertension, asthma,  hypertriglyceridemia, history of right knee surgery in the past.   MEDICATIONS:  Obtained by contacting [facility] on [address] [employer] and are as  follows:  1.  Celexa 40 mg daily.  2.  Advair.  3.  Spiriva inhaler.  4.  Albuterol p.r.n.  5.  Protonix 40 mg daily.  6.  Folic acid 1 mg daily.  7.  Hydrochlorothiazide 25 mg daily.  8.  Prevacid 20 mg daily.  9.  Vicodin 500 mg p.o. q.6-8 hours  p.r.n.  10. Ultram 50 mg q.4-6 hours p.r.n.   ALLERGIES:  No known drug allergies.   SOCIAL HISTORY:  She is quite unreliable.  For full details, please wait for  the behavioral health/ACT Team Assessment.  However, as noted, she denies  all drugs upon questioning.  However, in the nursing assessment, it is noted  checks for alcohol and tobacco were noted and her urine drug screen was  significant for marijuana.  Apparently, she has been recently evicted.   FAMILY HISTORY:  Noncontributory.   REVIEW OF SYSTEMS:  This is unobtainable at this time.   PHYSICAL EXAMINATION:  VITAL SIGNS:  Ms. Heidi Young's blood pressure was  104/60, temperature 98.4, heart rate 100.  She is currently getting IV  fluids.  GENERAL:  The patient appears to be quite withdrawn, covering her head with  sheets.  She does not appear to be in apparent acute distress.  HEENT:  Head is normocephalic.  CARDIOVASCULAR:  Normal S1 and S2.  LUNGS:  Clear to auscultation bilaterally.  ABDOMEN:  Soft and nontender.  EXTREMITIES:  No edema.  NEUROLOGY:  No  focal neurologic deficit.  She does appear to be euthymic and  her affect is flat.   LABORATORY DATA:  Total CPK was 1501, troponin less than 0.05 at 1703.  THC  was positive on the screen. Sodium 141, potassium 2.6, BUN 30, creatinine  2.4, glucose 121, AST/ALT 53/35, albumin 4.7, calcium 10.0. WBC 6.8,  hemoglobin 11.6, platelet count 338, MCV 83.   EKG revealed normal sinus rhythm with left ventricular hypertrophy,  nonspecific T wave abnormalities, and mild QTC of 497.   ASSESSMENT:  1.  Altered mental status.  2.  Mild rhabdomyolysis suspected, but not absolutely clear on the etiology      of this finding.  3.  Renal insufficiency, renal failure, uncertain of creatinine baseline.      The patient reports a history of status post right nephrectomy in 1990's      in [place].  I do not have these records for comparison.  4.  Hypokalemia.  5.  History of asthma and hypertension.   PLAN:  We are going to continue hydrating Ms. Heidi Young with IV fluids and  replete her electrolyte deficits.  We will follow her clinical course along  with psychiatry.  She will need to pursue placement once her CK totals begin  trending downward and her renal function is assured to be stable.  We will  follow her clinical course.  She will likely need 1:1 observation if she  does have any other evidence of combative behavior.  She does deny suicidal  or homicidal ideation at this time.      Hettie Holstein, D.O.  Electronically Signed     ESS/MEDQ  D:  11/01/2005  T:  11/01/2005  Job:  [id number]   cc:   Heidi Young, M.D.  Fax: [phone]

## 2010-11-03 NOTE — Discharge Summary (Signed)
NAME:  Heidi Young, Heidi Young NO.:  [id number]   MEDICAL RECORD NO.:  [id number]          PATIENT TYPE:  INP   LOCATION:  1401                         FACILITY:  [facility]   PHYSICIAN:  Michaelyn Barter, M.D. DATE OF BIRTH:  1945-09-08   DATE OF ADMISSION:  10/31/2005  DATE OF DISCHARGE:  11/03/2005                                 DISCHARGE SUMMARY   PRIMARY CARE PHYSICIAN:  [facility].   FINAL DIAGNOSES:  1.  Acute altered mental status changes most likely secondary to delirium.  2.  Severe hypokalemia.  3.  Rhabdomyolysis.  4.  Hypertension.   CONSULTATIONS:  1.  Psychiatry with Dr. Jeanie Sewer.  2.  The ACT Team.   HISTORY OF PRESENT ILLNESS:  Heidi Young is a 65 year old female who was  brought to the hospital ER by the [facility] Department.  She was  involuntarily committed at 1730 on Oct 31, 2005.  At that time, she appeared  to be confused, combative and poorly compliant.  It was noted that the  patient had recently been evicted from her home and was under a great deal  of stress.  The police found the patient's home to be in a state of disarray  with her utilities having been turned off for at least a week.   HOSPITAL COURSE:  Problem 1:  ALTERED MENTAL STATUS/DELIRIUM:  While in the  emergency department, the patient required restraints and the [facility] Department initially saw the patient.  A urine drug screen  was  positive for tetrahydrocannabinol.   By the following day, the patient appeared to be more cooperative, although  she insisted that she was ready to be discharged from the hospital.  She  appeared to be clearer in her thinking processes.  Psychiatry was consulted  and Dr. Jeanie Sewer was the physician who saw the patient.  His assessment was  that the patient had an axis delirium, NOS.  The etiology was not as clear  but it could have been secondary to substance versus dehydration or other  organic process.  He recommended  that further monitoring of the patient's  stability be undertaken and that she be provided with psychosocial support  given the fact that she is currently homeless.  He also recommended that the  patient pursue psychiatric inpatient admission; however, the patient refused  and at that particular time the patient was no longer committable.   Problem 2:  HYPOKALEMIA:  The patient's potassium at the time of admission  was 2.6.  She received aggressive IV potassium supplementation and over the  course of her hospitalization her potassium corrected itself to a level of  3.9.   Problem 3:  RHABDOMYOLYSIS:  The patient's creatinine kinase was 1501.  She  received aggressive IV fluid hydration for this.  Her complaints of muscle  pain were minimum.   Problem 4:  HYPERTENSION:  The patient's blood pressure initially was  slightly low, when she arrived to the hospital.  Over the following day, it  had significantly increased and antihypertensive medications had to be  initiated.   CONDITION ON DISCHARGE:  Improved.  Today,  the patient denies having any  complaints and she appears to be much more cooperative.  Again, she refuses  to seek inpatient psychiatric assistance.  She indicates that she does not  need any medication refills and that she would like to resume her previously  prescribed medication.   DISCHARGE MEDICATIONS:  1.  Norvasc 5 mg daily.  2.  Celexa 40 mg p.o. daily.  3.  Protonix 40 mg p.o. daily.  4.  K-Dur 10 mEq p.o. daily.  5.  Senokot S 1 tablet p.o. q.h.s.  6.  Spiriva 18 mcg via inhaler daily.  7.  Tylenol 650 mg p.o. q.4h. p.r.n.   The patient will be instructed to follow up with [facility] as soon as possible.      Michaelyn Barter, M.D.  Electronically Signed     OR/MEDQ  D:  11/03/2005  T:  11/03/2005  Job:  [id number]

## 2011-05-03 ENCOUNTER — Ambulatory Visit: Payer: Medicare Other

## 2011-05-29 ENCOUNTER — Ambulatory Visit
Admission: RE | Admit: 2011-05-29 | Discharge: 2011-05-29 | Disposition: A | Discharge status: Home / Self Care | Payer: Medicare Other | Source: Ambulatory Visit | Attending: Internal Medicine | Admitting: Internal Medicine

## 2011-05-29 DIAGNOSIS — Z1231 Encounter for screening mammogram for malignant neoplasm of breast: Secondary | ICD-10-CM

## 2011-08-26 ENCOUNTER — Encounter (HOSPITAL_COMMUNITY): Payer: Self-pay | Admitting: *Deleted

## 2011-08-26 ENCOUNTER — Emergency Department (INDEPENDENT_AMBULATORY_CARE_PROVIDER_SITE_OTHER)
Admission: EM | Admit: 2011-08-26 | Discharge: 2011-08-26 | Disposition: A | Discharge status: Home / Self Care | Payer: Medicare Other | Source: Home / Self Care | Attending: Emergency Medicine | Admitting: Emergency Medicine

## 2011-08-26 DIAGNOSIS — N390 Urinary tract infection, site not specified: Secondary | ICD-10-CM

## 2011-08-26 HISTORY — DX: Acquired absence of kidney: Z90.5

## 2011-08-26 HISTORY — DX: Unspecified psychosis not due to a substance or known physiological condition: F29

## 2011-08-26 HISTORY — DX: Urinary tract infection, site not specified: N39.0

## 2011-08-26 HISTORY — DX: Other intervertebral disc displacement, lumbar region: M51.26

## 2011-08-26 HISTORY — DX: Malignant (primary) neoplasm, unspecified: C80.1

## 2011-08-26 HISTORY — DX: Schizophrenia, unspecified: F20.9

## 2011-08-26 HISTORY — DX: Calculus of kidney: N20.0

## 2011-08-26 LAB — POCT URINALYSIS DIP (DEVICE)
Bilirubin Urine: NEGATIVE
Glucose, UA: NEGATIVE mg/dL
Hgb urine dipstick: NEGATIVE
Leukocytes, UA: NEGATIVE
Nitrite: NEGATIVE

## 2011-08-26 MED ORDER — PHENAZOPYRIDINE HCL 200 MG PO TABS: 200.0000 mg | ORAL_TABLET | Freq: Three times a day (TID) | ORAL | Status: AC | PRN

## 2011-08-26 MED ORDER — IBUPROFEN 600 MG PO TABS: 600.0000 mg | ORAL_TABLET | Freq: Four times a day (QID) | ORAL | Status: DC | PRN

## 2011-08-26 MED ORDER — NITROFURANTOIN MONOHYD MACRO 100 MG PO CAPS: 100.0000 mg | ORAL_CAPSULE | Freq: Two times a day (BID) | ORAL | Status: AC

## 2011-08-26 NOTE — ED Notes (Signed)
CO LEFT SIDE FLANK PAIN X 2-3 DAYS WITH FREQUENT URINATION ALSO STATES ODOR TO URINE

## 2011-08-26 NOTE — Discharge Instructions (Signed)
Make sure you strain all urine. Follow up with Dr. Patsi Sears if you are not getting better. Otherwise, see your doctor in several days. Give [place] a working phone number so that we can reach you if needed. Finish your antibiotics even if you feel better. Return to the ED for fever >100.4, if you are unable to keep fluids down, if your pain is uncontrolled with your usual medications, or any other concerns.

## 2011-08-26 NOTE — ED Provider Notes (Signed)
History     CSN: [id number]  Arrival date & time 08/26/11  1019   First MD Initiated Contact with Patient 08/26/11 1034      Chief Complaint  Patient presents with  . Flank Pain    (Consider location/radiation/quality/duration/timing/severity/associated sxs/prior treatment) HPI Comments: Patient reports urinary urgency, frequency, cloudy, oderous urine, and dysuria for 2 or 3 days. Has lower abdominal pressure after urinating. Patient reports left flank, and left back pain as well. States this feels identical to previous UTIs. Also has a history of kidney stones, remote. No nausea, vomiting, fevers, other abdominal pain, vaginal bleeding, vaginal discharge, rash. No aggravating, alleviating factors. Has not tried anything for this. Patient is status post right nephrectomy for an unknown type of cancer years ago.   ROS as noted in HPI. All other ROS negative.   Patient is a 66 y.o. female presenting with dysuria. The history is provided by the patient. No language interpreter was used.  Dysuria  This is a new problem. The current episode started 2 days ago. The problem occurs every urination. The problem has not changed since onset.There has been no fever. She is not sexually active. There is no history of pyelonephritis. Associated symptoms include frequency, urgency and flank pain. Pertinent negatives include no chills, no sweats, no nausea, no vomiting, no discharge, no hematuria and no hesitancy. She has tried nothing for the symptoms. Her past medical history is significant for kidney stones and single kidney.    Past Medical History  Diagnosis Date  . Chest pain   . Cardiomyopathy   . HTN (hypertension)   . Takotsubo cardiomyopathy   . HLD (hyperlipidemia)   . Asthma   . Fibromyalgia   . DJD (degenerative joint disease)   . Chronic anemia   . Obesity   . Carotid bruit     left  . Psychosis   . Single kidney   . Lumbar herniated disc   . Cancer     unknown type. had  kidney removed v/c of CA.   Marland Kitchen Kidney stones   . UTI (lower urinary tract infection)   . Schizophrenia     Past Surgical History  Procedure Date  . Nephrectomy 1990's  . Breast lumpectomy 90's    left  . Cystectomy 90's    back (left)  . Total knee arthroplasty   . Cesarean section     Family History  Problem Relation Age of Onset  . Coronary artery disease    . Heart failure    . Stroke    . Brain cancer    . Hypertension      History  Substance Use Topics  . Smoking status: Never Smoker   . Smokeless tobacco: Not on file  . Alcohol Use: No    OB History    Grav Para Term Preterm Abortions TAB SAB Ect Mult Living                  Review of Systems  Constitutional: Negative for chills.  Gastrointestinal: Negative for nausea and vomiting.  Genitourinary: Positive for dysuria, urgency, frequency and flank pain. Negative for hesitancy and hematuria.    Allergies  Avelox  Home Medications   Current Outpatient Rx  Name Route Sig Dispense Refill  . BUPROPION HCL 100 MG PO TABS Oral Take 100 mg by mouth 2 (two) times daily.    Marland Kitchen LURASIDONE HCL 40 MG PO TABS Oral Take by mouth daily with breakfast.    .  ALBUTEROL SULFATE HFA 108 (90 BASE) MCG/ACT IN AERS Inhalation Inhale 2 puffs into the lungs every 6 (six) hours as needed.      Marland Kitchen AMLODIPINE BESYLATE 5 MG PO TABS Oral Take 5 mg by mouth daily.      . ASPIRIN 81 MG PO TABS Oral Take 81 mg by mouth daily.      Marland Kitchen CARVEDILOL 6.25 MG PO TABS Oral Take 6.25 mg by mouth 2 (two) times daily with a meal.      . CYCLOBENZAPRINE HCL 10 MG PO TABS Oral Take 10 mg by mouth 3 (three) times daily as needed.      Marland Kitchen FLUTICASONE-SALMETEROL 100-50 MCG/DOSE IN AEPB Inhalation Inhale 1 puff into the lungs every 12 (twelve) hours.      . FUROSEMIDE 20 MG PO TABS Oral Take 20 mg by mouth 2 (two) times daily.      Marland Kitchen NITROFURANTOIN MONOHYD MACRO 100 MG PO CAPS Oral Take 1 capsule (100 mg total) by mouth 2 (two) times daily. 10 capsule 0    . PHENAZOPYRIDINE HCL 200 MG PO TABS Oral Take 1 tablet (200 mg total) by mouth 3 (three) times daily as needed for pain. 6 tablet 0  . POTASSIUM CHLORIDE 10 MEQ PO TBCR Oral Take 10 mEq by mouth daily.      Marland Kitchen PRAVASTATIN SODIUM 40 MG PO TABS Oral Take 40 mg by mouth daily.      . TRAMADOL HCL 50 MG PO TABS Oral Take 50 mg by mouth every 8 (eight) hours as needed.        BP 146/78  Pulse 71  Temp(Src) 97.7 F (36.5 C) (Oral)  Resp 17  SpO2 100%  Physical Exam  Nursing note and vitals reviewed. Constitutional: She is oriented to person, place, and time. She appears well-developed and well-nourished.  HENT:  Head: Normocephalic and atraumatic.  Eyes: Conjunctivae and EOM are normal.  Neck: Normal range of motion.  Cardiovascular: Normal rate, regular rhythm, normal heart sounds and intact distal pulses.   Pulmonary/Chest: Effort normal and breath sounds normal.  Abdominal: Soft. Bowel sounds are normal. She exhibits no distension. There is tenderness. There is no rebound, no guarding and no CVA tenderness.       Left flank tenderness  Musculoskeletal: Normal range of motion.  Neurological: She is alert and oriented to person, place, and time.  Skin: Skin is warm and dry.  Psychiatric: She has a normal mood and affect. Her behavior is normal. Judgment and thought content normal.    ED Course  Procedures (including critical care time)   Labs Reviewed  POCT URINALYSIS DIP (DEVICE)  URINE CULTURE   No results found.   1. UTI (lower urinary tract infection)     Results for orders placed during the hospital encounter of 08/26/11  POCT URINALYSIS DIP (DEVICE)      Component Value Range   Glucose, UA NEGATIVE  NEGATIVE (mg/dL)   Bilirubin Urine NEGATIVE  NEGATIVE    Ketones, ur NEGATIVE  NEGATIVE (mg/dL)   Specific Gravity, Urine 1.020  1.005 - 1.030    Hgb urine dipstick NEGATIVE  NEGATIVE    pH 5.5  5.0 - 8.0    Protein, ur NEGATIVE  NEGATIVE (mg/dL)   Urobilinogen, UA  0.2  0.0 - 1.0 (mg/dL)   Nitrite NEGATIVE  NEGATIVE    Leukocytes, UA NEGATIVE  NEGATIVE      MDM  Previous labs, records reviewed. Additional medical history obtained. Udip noted. Will  send off urine culture. Patient to provide working phone number so that can contact her if we need to change her antibiotics. Will treat as UTI, with Macrobid as this is most effective against urine pathogens, per antibiotiogram. She may also have a kidney stone, but no symptoms/signs of obstruction. Will have her strain her urine. Have her followup with her regular doctor she is getting better with the antibiotics, and with urology if she is not, to rule out kidney stone. Patient agrees with plan.  Domenick Gong, MD 08/26/11 1114

## 2011-08-28 LAB — URINE CULTURE: Culture  Setup Time: 201303102210

## 2011-08-29 ENCOUNTER — Telehealth (HOSPITAL_COMMUNITY): Payer: Self-pay | Admitting: *Deleted

## 2011-08-29 MED ORDER — SULFAMETHOXAZOLE-TRIMETHOPRIM 800-160 MG PO TABS: 1.0000 | ORAL_TABLET | Freq: Two times a day (BID) | ORAL | Status: AC

## 2011-08-29 NOTE — ED Notes (Signed)
Urine culture:>100,000 colonies Proteus Mirabilis. Pt. treated with Macrobid- resistant. Order obtained from Dr. Chaney Malling for Cipro 500 mg. PO BID x 7 days. I called and left message to call. Vassie Moselle 08/29/2011

## 2011-08-30 ENCOUNTER — Telehealth (HOSPITAL_COMMUNITY): Payer: Self-pay | Admitting: *Deleted

## 2011-08-30 NOTE — ED Notes (Signed)
Mobile # incorrect. I called son Bea Graff. He said his Mom was with him. Pt. verified x 2 and given results. Pt. told that bacteria was resistant to Macrobid and we need to change her Rx. to Bactrim DS. Pt. wants Rx. called to [facility] on [address] Rx. Called to pharmacist. Pharmacist asked why I would not put it on the VM. I told her I have had to many Rx.'s get lost on VM. She said I was going to have to hold longer as they get busier. Vassie Moselle 08/30/2011

## 2012-01-31 ENCOUNTER — Encounter: Payer: Self-pay | Admitting: *Deleted

## 2012-02-12 ENCOUNTER — Ambulatory Visit (INDEPENDENT_AMBULATORY_CARE_PROVIDER_SITE_OTHER): Payer: Medicare Other | Admitting: Cardiology

## 2012-02-12 ENCOUNTER — Encounter: Payer: Self-pay | Admitting: Cardiology

## 2012-02-12 ENCOUNTER — Ambulatory Visit: Payer: Medicare Other | Admitting: Cardiology

## 2012-02-12 VITALS — BP 140/82 | HR 66 | Ht 67.0 in | Wt 226.0 lb

## 2012-02-12 DIAGNOSIS — R079 Chest pain, unspecified: Secondary | ICD-10-CM

## 2012-02-12 DIAGNOSIS — I10 Essential (primary) hypertension: Secondary | ICD-10-CM

## 2012-02-12 DIAGNOSIS — I428 Other cardiomyopathies: Secondary | ICD-10-CM

## 2012-02-12 MED ORDER — ASPIRIN EC 81 MG PO TBEC: 81.0000 mg | DELAYED_RELEASE_TABLET | Freq: Every day | ORAL | Status: AC

## 2012-02-12 MED ORDER — OMEPRAZOLE 20 MG PO CPDR: 20.0000 mg | DELAYED_RELEASE_CAPSULE | Freq: Every day | ORAL | Status: DC

## 2012-02-12 NOTE — Patient Instructions (Addendum)
Your physician has requested that you have a stress echocardiogram. For further information please visit [email or web address] Please follow instruction sheet as given.  Your physician recommends that you schedule a follow-up appointment in: 1 month with Norma Fredrickson, NP-C.  Start:  Aspirin 81mg  daily.  Start: Omeprazole 20mg  daily.

## 2012-02-12 NOTE — Assessment & Plan Note (Signed)
Atypical chest pain.  Pain occurs when lying down in bed or after eating certain foods.  It certainly may be GERD.  She is concerned about her heart.  I will obtain a stress echo.  I will also have her start omeprazole 20 mg daily.  She additionally should take ASA 81 mg daily.  If stress echo is normal, she can followup with the PA in a month to see if symptoms improved with PPI.

## 2012-02-12 NOTE — Progress Notes (Signed)
Patient ID: Heidi Young, female   DOB: [birth year], 66 y.o.   MRN: [id number] PCP: Dr. Concepcion Elk  66 yo with history of HTN and hyperlipidemia presented to [facility] in 9/10 with an episode of chest pain. She had an episode of exertional chest pressure lasting 20-30 minutes while walking with her grandson. She came to the ER and had one elevated set of cardiac enzymes with troponin 0.09. Echo showed an apical wall motion abnormality with EF 45-50%. Patient had a left heart cath, but this showed no obstructive CAD but the apical wall motion abnormality was noted. 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  She had a repeat echo in 4/11 that showed that her LV function had normalized with EF 55-60%.    She has not been seen in this office since that time.  In general, she has done well.  However, for the last 6 weeks, she has been feeling a "pulling" pain in her left chest.  It happens when she lies in bed at night and sometimes also after she has eaten "greasy" food.  She will feel it about once a week and it can last up to 30 minutes at a time.  She has no h/o GERD.  She has no exertional chest pain.  She chronically gets short of breath after walking for about 10 minutes and has to rest.  No syncope or lightheadedness.  No orthopnea.     Labs (9/10): creatinine 1.29, LDL 145, HDL 58, HCT 25.7   ECG: NSR, normal  Allergies (verified):  No Known Drug Allergies    Past Medical History:  1. Chest pain: LHC (9/10) with no significant obstructive CAD, EF 55%, mild apical hypokinesis.  2. Mild cardiomyopathy: echo (9/10) showed EF 45-50% with distal septal and apical hypokinesis. Mild LAE, PASP 34 mmHg. 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  Repeat echo in 4/11 showed EF 55-60% with mild MR.  3. HTN  4. Hyperlipidemia  5. Asthma (takes Advair)  6. Fibromyalgia  7. DJD  8. Chronic anemia  9. 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 Obesity  11. Left carotid  bruit: carotid dopplers 9/10 at [facility] showed minimal disease.   Family History:  Mother deceased at age 46, positive history of CAD that started in her 66s. She also had congestive heart failure and history of CVA. Father deceased at age 4 from brain tumor. No known history of CAD. She has several siblings, all of which have hypertension and 1 sibling with aortic stenosis, but no known history of CAD.   Social History:  The patient lives in [place] with her grandson. She is on disability. She has no significant smoking, EtOH, or illicit drug use history except for smoking marijuana approximately 1  time per week due to her chronic pain.    Review of Systems  All systems reviewed and negative except as per HPI.   Current Outpatient Prescriptions  Medication Sig Dispense Refill  . amLODipine (NORVASC) 5 MG tablet Take 5 mg by mouth daily.        Marland Kitchen buPROPion (WELLBUTRIN) 100 MG tablet Take 300 mg by mouth daily.       . carvedilol (COREG) 6.25 MG tablet Take 25 mg by mouth 2 (two) times daily with a meal.       . furosemide (LASIX) 20 MG tablet Take 40 mg by mouth as needed.       . potassium chloride (KLOR-CON) 10 MEQ CR tablet Take  10 mEq by mouth as needed.       . pravastatin (PRAVACHOL) 40 MG tablet Take 40 mg by mouth daily.        Marland Kitchen aspirin EC 81 MG tablet Take 1 tablet (81 mg total) by mouth daily.  90 tablet  3  . omeprazole (PRILOSEC) 20 MG capsule Take 1 capsule (20 mg total) by mouth daily.  30 capsule  11    BP 140/82  Pulse 66  Ht 5\' 7"  (1.702 m)  Wt 226 lb (102.513 kg)  BMI 35.40 kg/m2 General: NAD, obese Neck: No JVD, no thyromegaly or thyroid nodule.  Lungs: Clear to auscultation bilaterally with normal respiratory effort. CV: Nondisplaced PMI.  Heart regular S1/S2, no S3/S4, no murmur.  No peripheral edema.  No carotid bruit.  Normal pedal pulses.  Abdomen: Soft, nontender, no hepatosplenomegaly, no distention.  Neurologic: Alert and oriented x 3.  Psych:  Normal affect. Extremities: No clubbing or cyanosis.

## 2012-02-12 NOTE — Assessment & Plan Note (Signed)
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  Will reassess LV systolic function on stress echo, as described above.

## 2012-02-12 NOTE — Assessment & Plan Note (Signed)
BP seems to be reasonably controlled though it is borderline elevated today.

## 2012-02-22 ENCOUNTER — Encounter: Payer: Self-pay | Admitting: Cardiology

## 2012-02-22 ENCOUNTER — Other Ambulatory Visit: Payer: Self-pay

## 2012-02-22 ENCOUNTER — Ambulatory Visit (HOSPITAL_COMMUNITY): Payer: Medicare Other | Attending: Cardiology

## 2012-02-22 ENCOUNTER — Ambulatory Visit (HOSPITAL_BASED_OUTPATIENT_CLINIC_OR_DEPARTMENT_OTHER): Payer: Medicare Other

## 2012-02-22 DIAGNOSIS — R079 Chest pain, unspecified: Secondary | ICD-10-CM

## 2012-02-22 DIAGNOSIS — I5181 Takotsubo syndrome: Secondary | ICD-10-CM | POA: Insufficient documentation

## 2012-02-22 DIAGNOSIS — I10 Essential (primary) hypertension: Secondary | ICD-10-CM | POA: Insufficient documentation

## 2012-02-22 DIAGNOSIS — R072 Precordial pain: Secondary | ICD-10-CM | POA: Insufficient documentation

## 2012-02-22 DIAGNOSIS — R0989 Other specified symptoms and signs involving the circulatory and respiratory systems: Secondary | ICD-10-CM

## 2012-02-22 NOTE — Progress Notes (Signed)
Echocardiogram performed.  

## 2012-03-17 ENCOUNTER — Ambulatory Visit: Payer: Medicare Other | Admitting: Nurse Practitioner

## 2012-03-24 ENCOUNTER — Ambulatory Visit: Payer: Medicare Other | Admitting: Nurse Practitioner

## 2012-11-25 ENCOUNTER — Other Ambulatory Visit: Payer: Self-pay

## 2012-11-25 DIAGNOSIS — Z1231 Encounter for screening mammogram for malignant neoplasm of breast: Secondary | ICD-10-CM

## 2012-12-25 ENCOUNTER — Ambulatory Visit
Admission: RE | Admit: 2012-12-25 | Discharge: 2012-12-25 | Disposition: A | Discharge status: Home / Self Care | Payer: Medicare Other | Source: Ambulatory Visit

## 2012-12-25 ENCOUNTER — Ambulatory Visit: Payer: Medicare Other

## 2012-12-25 DIAGNOSIS — Z1231 Encounter for screening mammogram for malignant neoplasm of breast: Secondary | ICD-10-CM

## 2013-01-06 ENCOUNTER — Encounter: Payer: Self-pay | Admitting: Obstetrics

## 2013-01-08 ENCOUNTER — Ambulatory Visit: Payer: Medicare Other | Admitting: Obstetrics

## 2013-01-29 ENCOUNTER — Ambulatory Visit: Payer: Medicare Other | Admitting: Obstetrics

## 2013-02-01 ENCOUNTER — Encounter (HOSPITAL_COMMUNITY): Payer: Self-pay | Admitting: *Deleted

## 2013-02-01 ENCOUNTER — Emergency Department (INDEPENDENT_AMBULATORY_CARE_PROVIDER_SITE_OTHER): Payer: Medicare Other

## 2013-02-01 ENCOUNTER — Emergency Department (INDEPENDENT_AMBULATORY_CARE_PROVIDER_SITE_OTHER)
Admission: EM | Admit: 2013-02-01 | Discharge: 2013-02-01 | Disposition: A | Discharge status: Home / Self Care | Payer: Medicare Other | Source: Home / Self Care

## 2013-02-01 DIAGNOSIS — M940 Chondrocostal junction syndrome [Tietze]: Secondary | ICD-10-CM

## 2013-02-01 NOTE — ED Provider Notes (Signed)
Heidi Young is a 67 y.o. female who presents to Urgent Care today for left-sided chest pain present for the last 2 weeks occurring off and on. Patient describes the pain as a sharp pulling or stabbing sensation. It does not seem to be exacerbated by exertion or deep breathing. He denies any trouble breathing or palpitations. Pain is located underneath her left breast. She denies any fevers or chills. She's tried tramadol for this issue which has not helped much.  She feels well otherwise. No nausea vomiting diarrhea.    PMH reviewed. Chronic pain, schizophrenia, history of renal cell carcinoma greater than 10 years ago currently in remission, history of cardiomyopathy with a normal stress echo recently.  History  Substance Use Topics  . Smoking status: Never Smoker   . Smokeless tobacco: Not on file  . Alcohol Use: No   ROS as above Medications reviewed. No current facility-administered medications for this encounter.   Current Outpatient Prescriptions  Medication Sig Dispense Refill  . ALBUTEROL IN Inhale into the lungs.      Marland Kitchen amLODipine (NORVASC) 5 MG tablet Take 5 mg by mouth daily.        Marland Kitchen aspirin EC 81 MG tablet Take 1 tablet (81 mg total) by mouth daily.  90 tablet  3  . carvedilol (COREG) 6.25 MG tablet Take 25 mg by mouth 2 (two) times daily with a meal.       . Cholecalciferol (VITAMIN D PO) Take by mouth.      . Cyclobenzaprine HCl (FLEXERIL PO) Take by mouth.      . Fluticasone-Salmeterol (ADVAIR HFA IN) Inhale into the lungs.      . furosemide (LASIX) 20 MG tablet Take 40 mg by mouth as needed.       . Hydrocodone-Acetaminophen (VICODIN PO) Take by mouth.      . potassium chloride (KLOR-CON) 10 MEQ CR tablet Take 10 mEq by mouth as needed.       . pravastatin (PRAVACHOL) 40 MG tablet Take 40 mg by mouth daily.        . TRAMADOL HCL PO Take by mouth.      Marland Kitchen buPROPion (WELLBUTRIN) 100 MG tablet Take 300 mg by mouth daily.       Marland Kitchen omeprazole (PRILOSEC) 20 MG capsule  Take 1 capsule (20 mg total) by mouth daily.  30 capsule  11    Exam:  BP 132/70  Pulse 92  Temp(Src) 97.7 F (36.5 C) (Oral)  Resp 20  SpO2 97% Gen: Well NAD HEENT: EOMI,  MMM Lungs: CTABL Nl WOB Chest: Normal appearing chest wall. No rash. Palpation left anterior inferior ribs. No abdominal wall tenderness. Heart: RRR no MRG Abd: NABS, NT, ND, no rebound or guarding Exts: Non edematous BL  LE, warm and well perfused.   No results found for this or any previous visit (from the past 24 hour(s)). Dg Ribs Unilateral W/chest Left  02/01/2013   *RADIOLOGY REPORT*  Clinical Data: Left-sided chest pain for 2 weeks  LEFT RIBS AND CHEST - 3+ VIEW  Comparison: Chest radiograph 10/11/2010  Findings: The heart size and vascular pattern are normal.  Right diaphragm is again mildly elevated, stable appearance.  There is mild bilateral lower lobe atelectasis.  No consolidation, effusion, or pneumothorax.  No rib lesions or fractures.  IMPRESSION: No rib lesions.   Original Report Authenticated By: Esperanza Heir, M.D.   Twelve-lead EKG shows normal sinus rhythm at 79 beats per minute otherwise normal  Assessment and  Plan: 67 y.o. female with likely costochondritis. EKG and chest x-ray were normal appearing. Plan to use home tramadol or hydrocodone as needed. She will also use a heating pad and followup with primary care. Discussed warning signs or symptoms. Please see discharge instructions. Patient expresses understanding.      Rodolph Bong, MD 02/01/13 [phone]

## 2013-02-01 NOTE — ED Notes (Signed)
Denies injury.  C/O pain under lateral left breast x 2 wks.  Pain is constant but has intermittent "Squeezing"; unchanged by deep breathing.  Tender to touch per pt; seems to improve if she holds her ribcage.  Denies SOB.  Has been taking her normal Vicodin, tramadol, and flexeril; states pain just isn't getting better.  Has appt with PCP 8/21.

## 2013-02-01 NOTE — ED Notes (Signed)
Waiting for EKG to be performed unable to transport to x-ray

## 2013-02-18 ENCOUNTER — Encounter: Payer: Self-pay | Admitting: Obstetrics

## 2013-02-18 ENCOUNTER — Ambulatory Visit (INDEPENDENT_AMBULATORY_CARE_PROVIDER_SITE_OTHER): Payer: Medicare Other | Admitting: Obstetrics

## 2013-02-18 VITALS — BP 148/84 | HR 90 | Temp 98.3°F | Ht 67.0 in | Wt 253.2 lb

## 2013-02-18 DIAGNOSIS — Z0000 Encounter for general adult medical examination without abnormal findings: Secondary | ICD-10-CM

## 2013-02-18 DIAGNOSIS — Z01419 Encounter for gynecological examination (general) (routine) without abnormal findings: Secondary | ICD-10-CM

## 2013-02-18 NOTE — Progress Notes (Signed)
.   Subjective:     Heidi Young is a 67 y.o. female here for a routine exam.  No current complaints.  Personal health questionnaire reviewed: yes.   Gynecologic History No LMP recorded. Patient is postmenopausal. Contraception: none Last Pap: 2011. Results were: normal Last mammogram: 2014. Results were: normal  Obstetric History OB History  No data available     The following portions of the patient's history were reviewed and updated as appropriate: allergies, current medications, past family history, past medical history, past social history, past surgical history and problem list.  Review of Systems Pertinent items are noted in HPI.    Objective:    General appearance: alert and no distress Breasts: normal appearance, no masses or tenderness Abdomen: normal findings: soft, non-tender Pelvic: cervix normal in appearance, external genitalia normal, no adnexal masses or tenderness, no cervical motion tenderness, uterus normal size, shape, and consistency and vagina normal without discharge Extremities: extremities normal, atraumatic, no cyanosis or edema    Assessment:    Healthy female exam.    Plan:    Follow up in: 2 years.

## 2013-02-19 ENCOUNTER — Encounter: Payer: Self-pay | Admitting: Obstetrics

## 2013-02-20 LAB — PAP IG W/ RFLX HPV ASCU

## 2013-02-20 LAB — WET PREP BY MOLECULAR PROBE
Candida species: NEGATIVE
Gardnerella vaginalis: POSITIVE — AB
Trichomonas vaginosis: NEGATIVE

## 2013-03-08 ENCOUNTER — Telehealth: Payer: Self-pay | Admitting: *Deleted

## 2013-03-08 MED ORDER — METRONIDAZOLE 500 MG PO TABS: 500.0000 mg | ORAL_TABLET | Freq: Two times a day (BID) | ORAL | Status: AC

## 2013-03-08 NOTE — Telephone Encounter (Signed)
Call placed to patient at [phone], no answer. A voice message was left informing patient of Rx sent to pharmacy. She was advised to call office if any questions. rx sent to pharmacy.

## 2013-03-08 NOTE — Telephone Encounter (Signed)
Message copied by Glendell Docker on Sun Mar 08, 2013 11:38 AM ------      Message from: Coral Ceo A      Created: Fri Feb 20, 2013  5:52 AM       Flagyl 500mg  po bid x 7 days. ------

## 2013-03-17 ENCOUNTER — Other Ambulatory Visit: Payer: Self-pay | Admitting: Orthopedic Surgery

## 2013-03-19 ENCOUNTER — Encounter (HOSPITAL_COMMUNITY): Payer: Self-pay | Admitting: Respiratory Therapy

## 2013-03-23 ENCOUNTER — Encounter (HOSPITAL_COMMUNITY)
Admission: RE | Admit: 2013-03-23 | Discharge: 2013-03-23 | Disposition: A | Discharge status: Home / Self Care | Payer: Medicare Other | Source: Ambulatory Visit | Attending: Orthopedic Surgery | Admitting: Orthopedic Surgery

## 2013-03-23 ENCOUNTER — Ambulatory Visit (HOSPITAL_COMMUNITY)
Admission: RE | Admit: 2013-03-23 | Discharge: 2013-03-23 | Disposition: A | Discharge status: Home / Self Care | Payer: Medicare Other | Source: Ambulatory Visit | Attending: Orthopedic Surgery | Admitting: Orthopedic Surgery

## 2013-03-23 ENCOUNTER — Encounter (HOSPITAL_COMMUNITY): Payer: Self-pay

## 2013-03-23 DIAGNOSIS — I10 Essential (primary) hypertension: Secondary | ICD-10-CM | POA: Insufficient documentation

## 2013-03-23 DIAGNOSIS — Z01818 Encounter for other preprocedural examination: Secondary | ICD-10-CM | POA: Insufficient documentation

## 2013-03-23 HISTORY — DX: Gastro-esophageal reflux disease without esophagitis: K21.9

## 2013-03-23 HISTORY — DX: Cardiac murmur, unspecified: R01.1

## 2013-03-23 LAB — COMPREHENSIVE METABOLIC PANEL
ALT: 36 U/L — ABNORMAL HIGH (ref 0–35)
Albumin: 4.3 g/dL (ref 3.5–5.2)
Alkaline Phosphatase: 94 U/L (ref 39–117)
BUN: 24 mg/dL — ABNORMAL HIGH (ref 6–23)
CO2: 22 mEq/L (ref 19–32)
Chloride: 106 mEq/L (ref 96–112)
Creatinine, Ser: 1.29 mg/dL — ABNORMAL HIGH (ref 0.50–1.10)
GFR calc Af Amer: 49 mL/min — ABNORMAL LOW (ref >90)
GFR calc non Af Amer: 42 mL/min — ABNORMAL LOW (ref >90)
Glucose, Bld: 99 mg/dL (ref 70–99)
Potassium: 3.6 mEq/L (ref 3.5–5.1)
Total Bilirubin: 0.4 mg/dL (ref 0.3–1.2)
Total Protein: 7.6 g/dL (ref 6.0–8.3)

## 2013-03-23 LAB — CBC WITH DIFFERENTIAL/PLATELET
Basophils Relative: 1 % (ref 0–1)
Eosinophils Absolute: 0.3 10*3/uL (ref 0.0–0.7)
HCT: 33.6 % — ABNORMAL LOW (ref 36.0–46.0)
Hemoglobin: 11 g/dL — ABNORMAL LOW (ref 12.0–15.0)
Lymphs Abs: 1.9 10*3/uL (ref 0.7–4.0)
MCH: 27.2 pg (ref 26.0–34.0)
MCHC: 32.7 g/dL (ref 30.0–36.0)
Monocytes Absolute: 0.4 10*3/uL (ref 0.1–1.0)
Monocytes Relative: 9 % (ref 3–12)
Neutro Abs: 2.2 10*3/uL (ref 1.7–7.7)
RBC: 4.05 MIL/uL (ref 3.87–5.11)

## 2013-03-23 LAB — URINALYSIS, ROUTINE W REFLEX MICROSCOPIC
Bilirubin Urine: NEGATIVE
Hgb urine dipstick: NEGATIVE
Nitrite: NEGATIVE
Protein, ur: NEGATIVE mg/dL
Specific Gravity, Urine: 1.01 (ref 1.005–1.030)
Urobilinogen, UA: 0.2 mg/dL (ref 0.0–1.0)

## 2013-03-23 LAB — TYPE AND SCREEN
ABO/RH(D): O POS
Antibody Screen: NEGATIVE

## 2013-03-23 LAB — PROTIME-INR: INR: 1.06 (ref 0.00–1.49)

## 2013-03-23 LAB — APTT: aPTT: 33 seconds (ref 24–37)

## 2013-03-23 NOTE — Progress Notes (Signed)
Pt. Reports that she went to ER for chest pain in 01/2013- told that its intracostal rubbing, given NSAID, still feels the discomfort but told that it would probably take some time to resolve. ED- EKG- WNL

## 2013-03-23 NOTE — Pre-Procedure Instructions (Signed)
Heidi Young  03/23/2013   Your procedure is scheduled on:  03/25/2013  Report to [facility]  2 * 3  ENTRANCE A at 10:45 AM.  Call this number if you have problems the morning of surgery: [phone]   Remember:   Do not eat food or drink liquids after midnight. On TUESDAY   Take these medicines the morning of surgery with A SIP OF WATER: use inhaler, amlodipine, carvedilol, pain medicine if needed   Do not wear jewelry, make-up or nail polish.  Do not wear lotions, powders, or perfumes. You may wear deodorant.  Do not shave 48 hours prior to surgery.   Do not bring valuables to the hospital.  [facility] is not responsible                  for any belongings or valuables.               Contacts, dentures or bridgework may not be worn into surgery.  Leave suitcase in the car. After surgery it may be brought to your room.  For patients admitted to the hospital, discharge time is determined by your                treatment team.               Patients discharged the day of surgery will not be allowed to drive  home.  Name and phone number of your driver: with son  Special Instructions: Shower using CHG 2 nights before surgery and the night before surgery.  If you shower the day of surgery use CHG.  Use special wash - you have one bottle of CHG for all showers.  You should use approximately 1/3 of the bottle for each shower.   Please read over the following fact sheets that you were given: Pain Booklet, Coughing and Deep Breathing, Blood Transfusion Information, Total Joint Packet, MRSA Information and Surgical Site Infection Prevention

## 2013-03-24 MED ORDER — POVIDONE-IODINE 7.5 % EX SOLN
Freq: Once | CUTANEOUS | Status: DC
  Filled 2013-03-24: qty 118

## 2013-03-24 MED ORDER — CEFAZOLIN SODIUM-DEXTROSE 2-3 GM-% IV SOLR
2.0000 g | INTRAVENOUS | Status: AC
  Administered 2013-03-25: 2 g via INTRAVENOUS
  Filled 2013-03-24: qty 50

## 2013-03-24 NOTE — Progress Notes (Signed)
Anesthesia Chart Review: Patient is a 67 year old female scheduled for left TKR on 03/25/13 by Dr. Luiz Blare.  PAT visit was on 03/23/13.  History includes non-smoker, cardiomyopathy (question of Takotsubo but no triggering event versus myocarditis) 02/2009 with recovery of EF by echo 09/2009, murmur (mild MR by 09/2009 echo), HTN, HLD fibromyalgia, renal cancer s/p right nephrectomy '90's, nephrolithiasis, GERD, anemia, Schizophrenia, psychosis, asthma, obesity, left carotid bruit (no significant disease on carotid dopplers 02/2009 per Dr. Alford Highland notes), DJD, left breast lumpectomy for cyst, right TKA '05.  PCP is Dr. Concepcion Elk. Cardiologist is Dr. Shirlee Latch, last visit 02/12/12. He ordered a stress echo (which was normal) due to intermittent "pulling" pain in her left chest.    Stress echo on 02/22/12 showed: Normal stress echo with no evidence of ischemia Resting ECG with nonspecific lateral T wave changes. Normal LV global systolic function at rest (vigorous at peak stress).  No LV regional wall motion abnormalities.   Cardiac cath on 03/08/09 showed normal left main that bifurcates into the LAD and LCx.  Normal LAD before D2 then 20-30% stenosis following D2.  LCx without stenosis.  Dominant RCA with a segment of diffuse non-obstructive plaque in the proximal RCA.  Normal mid and distal RCA.  LV functions appears normal, possible subtle hypokinesis in teh apical portion, EF 55%. (EF was 45-50% by 03/04/09 echo.)  EKG on 02/01/13 showed NSR.  CXR on 03/23/13 showed no active cardiopulmonary disease.  Preoperative labs noted.  It has been a little over a year since her last cardiology visit, but she had a normal stress echo at that time. She only had minor CAD by cath in 2010, and her EF has recovered since then.  She remains on Coreg. (ARB was discontinued in 2011 when EF normalized.)  Her recent EKG was normal.  Further evaluation by her anesthesiologist on the day of surgery, but if no acute CV/CHF symptoms  then I would anticipate that she could proceed as planned.  Shonna Chock, PA-C [facility]/Anesthesiology Phone [phone] 03/24/2013 10:00 AM

## 2013-03-25 ENCOUNTER — Inpatient Hospital Stay (HOSPITAL_COMMUNITY): Payer: Medicare Other | Admitting: Anesthesiology

## 2013-03-25 ENCOUNTER — Encounter (HOSPITAL_COMMUNITY): Payer: Self-pay | Admitting: Anesthesiology

## 2013-03-25 ENCOUNTER — Encounter (HOSPITAL_COMMUNITY)
Admission: RE | Disposition: A | Discharge status: Skilled Nursing Facility | Payer: Self-pay | Source: Ambulatory Visit | Attending: Orthopedic Surgery

## 2013-03-25 ENCOUNTER — Encounter (HOSPITAL_COMMUNITY): Payer: Medicare Other | Admitting: Vascular Surgery

## 2013-03-25 ENCOUNTER — Inpatient Hospital Stay (HOSPITAL_COMMUNITY)
Admission: RE | Admit: 2013-03-25 | Discharge: 2013-03-27 | DRG: 470 | Disposition: A | Discharge status: Skilled Nursing Facility | Payer: Medicare Other | Source: Ambulatory Visit | Attending: Orthopedic Surgery | Admitting: Orthopedic Surgery

## 2013-03-25 DIAGNOSIS — I10 Essential (primary) hypertension: Secondary | ICD-10-CM | POA: Diagnosis present

## 2013-03-25 DIAGNOSIS — Z85528 Personal history of other malignant neoplasm of kidney: Secondary | ICD-10-CM

## 2013-03-25 DIAGNOSIS — K219 Gastro-esophageal reflux disease without esophagitis: Secondary | ICD-10-CM | POA: Diagnosis present

## 2013-03-25 DIAGNOSIS — Z808 Family history of malignant neoplasm of other organs or systems: Secondary | ICD-10-CM

## 2013-03-25 DIAGNOSIS — Z8249 Family history of ischemic heart disease and other diseases of the circulatory system: Secondary | ICD-10-CM

## 2013-03-25 DIAGNOSIS — Z7982 Long term (current) use of aspirin: Secondary | ICD-10-CM

## 2013-03-25 DIAGNOSIS — Z96659 Presence of unspecified artificial knee joint: Secondary | ICD-10-CM

## 2013-03-25 DIAGNOSIS — I428 Other cardiomyopathies: Secondary | ICD-10-CM | POA: Diagnosis present

## 2013-03-25 DIAGNOSIS — M1712 Unilateral primary osteoarthritis, left knee: Secondary | ICD-10-CM | POA: Diagnosis present

## 2013-03-25 DIAGNOSIS — J45909 Unspecified asthma, uncomplicated: Secondary | ICD-10-CM | POA: Diagnosis present

## 2013-03-25 DIAGNOSIS — Z6839 Body mass index (BMI) 39.0-39.9, adult: Secondary | ICD-10-CM

## 2013-03-25 DIAGNOSIS — IMO0001 Reserved for inherently not codable concepts without codable children: Secondary | ICD-10-CM | POA: Diagnosis present

## 2013-03-25 DIAGNOSIS — Z602 Problems related to living alone: Secondary | ICD-10-CM

## 2013-03-25 DIAGNOSIS — E785 Hyperlipidemia, unspecified: Secondary | ICD-10-CM | POA: Diagnosis present

## 2013-03-25 DIAGNOSIS — E669 Obesity, unspecified: Secondary | ICD-10-CM | POA: Diagnosis present

## 2013-03-25 DIAGNOSIS — Z79899 Other long term (current) drug therapy: Secondary | ICD-10-CM

## 2013-03-25 DIAGNOSIS — Z905 Acquired absence of kidney: Secondary | ICD-10-CM

## 2013-03-25 DIAGNOSIS — Z823 Family history of stroke: Secondary | ICD-10-CM

## 2013-03-25 DIAGNOSIS — F209 Schizophrenia, unspecified: Secondary | ICD-10-CM | POA: Diagnosis present

## 2013-03-25 DIAGNOSIS — Z87442 Personal history of urinary calculi: Secondary | ICD-10-CM

## 2013-03-25 DIAGNOSIS — M1710 Unilateral primary osteoarthritis, unspecified knee: Principal | ICD-10-CM | POA: Diagnosis present

## 2013-03-25 HISTORY — PX: TOTAL KNEE ARTHROPLASTY: SHX125

## 2013-03-25 SURGERY — ARTHROPLASTY, KNEE, TOTAL
Anesthesia: General | Site: Knee | Laterality: Left | Wound class: Clean

## 2013-03-25 MED ORDER — ONDANSETRON HCL 4 MG/2ML IJ SOLN: 4.0000 mg | Freq: Four times a day (QID) | INTRAMUSCULAR | Status: DC | PRN

## 2013-03-25 MED ORDER — LIDOCAINE HCL (CARDIAC) 20 MG/ML IV SOLN
INTRAVENOUS | Status: DC | PRN
  Administered 2013-03-25: 80 mg via INTRAVENOUS

## 2013-03-25 MED ORDER — METHOCARBAMOL 100 MG/ML IJ SOLN
500.0000 mg | Freq: Four times a day (QID) | INTRAVENOUS | Status: DC | PRN
  Filled 2013-03-25: qty 5

## 2013-03-25 MED ORDER — POLYETHYLENE GLYCOL 3350 17 G PO PACK
17.0000 g | PACK | Freq: Every day | ORAL | Status: DC | PRN
  Administered 2013-03-26: 17 g via ORAL
  Filled 2013-03-25: qty 1

## 2013-03-25 MED ORDER — DEXAMETHASONE SODIUM PHOSPHATE 10 MG/ML IJ SOLN
INTRAMUSCULAR | Status: DC | PRN
  Administered 2013-03-25: 10 mg via INTRAVENOUS

## 2013-03-25 MED ORDER — SODIUM CHLORIDE 0.9 % IR SOLN
Status: DC | PRN
  Administered 2013-03-25: 3000 mL
  Administered 2013-03-25: 1000 mL

## 2013-03-25 MED ORDER — PROMETHAZINE HCL 25 MG/ML IJ SOLN: 12.5000 mg | Freq: Four times a day (QID) | INTRAMUSCULAR | Status: DC | PRN

## 2013-03-25 MED ORDER — TRANEXAMIC ACID 100 MG/ML IV SOLN
1000.0000 mg | INTRAVENOUS | Status: AC
  Administered 2013-03-25: 1000 mg via INTRAVENOUS
  Filled 2013-03-25: qty 10

## 2013-03-25 MED ORDER — HYDROMORPHONE HCL PF 1 MG/ML IJ SOLN
0.2500 mg | INTRAMUSCULAR | Status: DC | PRN
  Administered 2013-03-25 (×3): 0.5 mg via INTRAVENOUS

## 2013-03-25 MED ORDER — AMLODIPINE BESYLATE 5 MG PO TABS
5.0000 mg | ORAL_TABLET | Freq: Every day | ORAL | Status: DC
  Administered 2013-03-26 – 2013-03-27 (×2): 5 mg via ORAL
  Filled 2013-03-25 (×3): qty 1

## 2013-03-25 MED ORDER — SODIUM CHLORIDE 0.9 % IV SOLN
INTRAVENOUS | Status: DC
  Administered 2013-03-26: 05:00:00 via INTRAVENOUS

## 2013-03-25 MED ORDER — OXYCODONE-ACETAMINOPHEN 5-325 MG PO TABS
ORAL_TABLET | ORAL | Status: AC
  Filled 2013-03-25: qty 2

## 2013-03-25 MED ORDER — HYDROMORPHONE HCL PF 1 MG/ML IJ SOLN
INTRAMUSCULAR | Status: AC
  Administered 2013-03-25: 0.5 mg via INTRAVENOUS
  Filled 2013-03-25: qty 1

## 2013-03-25 MED ORDER — ZOLPIDEM TARTRATE 5 MG PO TABS: 5.0000 mg | ORAL_TABLET | Freq: Every evening | ORAL | Status: DC | PRN

## 2013-03-25 MED ORDER — ALBUTEROL SULFATE HFA 108 (90 BASE) MCG/ACT IN AERS
2.0000 | INHALATION_SPRAY | Freq: Four times a day (QID) | RESPIRATORY_TRACT | Status: DC | PRN
  Filled 2013-03-25: qty 6.7

## 2013-03-25 MED ORDER — NEOSTIGMINE METHYLSULFATE 1 MG/ML IJ SOLN
INTRAMUSCULAR | Status: DC | PRN
  Administered 2013-03-25: 4 mg via INTRAVENOUS

## 2013-03-25 MED ORDER — SIMVASTATIN 20 MG PO TABS
20.0000 mg | ORAL_TABLET | Freq: Every day | ORAL | Status: DC
  Administered 2013-03-26: 20 mg via ORAL
  Filled 2013-03-25 (×2): qty 1

## 2013-03-25 MED ORDER — LACTATED RINGERS IV SOLN: INTRAVENOUS | Status: DC

## 2013-03-25 MED ORDER — METHOCARBAMOL 500 MG PO TABS
ORAL_TABLET | ORAL | Status: AC
  Filled 2013-03-25: qty 1

## 2013-03-25 MED ORDER — CARVEDILOL 25 MG PO TABS
25.0000 mg | ORAL_TABLET | Freq: Two times a day (BID) | ORAL | Status: DC
  Administered 2013-03-26 – 2013-03-27 (×3): 25 mg via ORAL
  Filled 2013-03-25 (×5): qty 1

## 2013-03-25 MED ORDER — DEXAMETHASONE 6 MG PO TABS
10.0000 mg | ORAL_TABLET | Freq: Three times a day (TID) | ORAL | Status: AC
  Administered 2013-03-26 (×2): 10 mg via ORAL
  Filled 2013-03-25 (×3): qty 1

## 2013-03-25 MED ORDER — HYDROMORPHONE HCL PF 1 MG/ML IJ SOLN
INTRAMUSCULAR | Status: AC
  Filled 2013-03-25: qty 1

## 2013-03-25 MED ORDER — OXYCODONE-ACETAMINOPHEN 5-325 MG PO TABS
1.0000 | ORAL_TABLET | ORAL | Status: DC | PRN
  Administered 2013-03-25 – 2013-03-27 (×8): 2 via ORAL
  Filled 2013-03-25 (×7): qty 2

## 2013-03-25 MED ORDER — MOMETASONE FURO-FORMOTEROL FUM 100-5 MCG/ACT IN AERO
2.0000 | INHALATION_SPRAY | Freq: Two times a day (BID) | RESPIRATORY_TRACT | Status: DC
  Administered 2013-03-26 – 2013-03-27 (×3): 2 via RESPIRATORY_TRACT
  Filled 2013-03-25: qty 8.8

## 2013-03-25 MED ORDER — LIDOCAINE HCL 4 % MT SOLN
OROMUCOSAL | Status: DC | PRN
  Administered 2013-03-25: 4 mL via TOPICAL

## 2013-03-25 MED ORDER — KETOROLAC TROMETHAMINE 30 MG/ML IJ SOLN
INTRAMUSCULAR | Status: DC | PRN
  Administered 2013-03-25: 30 mg via INTRAVENOUS

## 2013-03-25 MED ORDER — FENTANYL CITRATE 0.05 MG/ML IJ SOLN
INTRAMUSCULAR | Status: AC
  Administered 2013-03-25: 100 ug
  Filled 2013-03-25: qty 2

## 2013-03-25 MED ORDER — ROCURONIUM BROMIDE 100 MG/10ML IV SOLN
INTRAVENOUS | Status: DC | PRN
  Administered 2013-03-25: 50 mg via INTRAVENOUS

## 2013-03-25 MED ORDER — PROPOFOL 10 MG/ML IV BOLUS
INTRAVENOUS | Status: DC | PRN
  Administered 2013-03-25: 200 mg via INTRAVENOUS

## 2013-03-25 MED ORDER — LACTATED RINGERS IV SOLN
INTRAVENOUS | Status: DC | PRN
  Administered 2013-03-25 (×2): via INTRAVENOUS

## 2013-03-25 MED ORDER — ONDANSETRON HCL 4 MG/2ML IJ SOLN
INTRAMUSCULAR | Status: DC | PRN
  Administered 2013-03-25: 4 mg via INTRAMUSCULAR

## 2013-03-25 MED ORDER — ALUM & MAG HYDROXIDE-SIMETH 200-200-20 MG/5ML PO SUSP: 30.0000 mL | ORAL | Status: DC | PRN

## 2013-03-25 MED ORDER — ONDANSETRON HCL 4 MG/2ML IJ SOLN: 4.0000 mg | Freq: Once | INTRAMUSCULAR | Status: DC | PRN

## 2013-03-25 MED ORDER — METHOCARBAMOL 500 MG PO TABS
500.0000 mg | ORAL_TABLET | Freq: Four times a day (QID) | ORAL | Status: DC | PRN
  Administered 2013-03-25 – 2013-03-27 (×3): 500 mg via ORAL
  Filled 2013-03-25 (×2): qty 1

## 2013-03-25 MED ORDER — POTASSIUM CHLORIDE CRYS ER 20 MEQ PO TBCR
20.0000 meq | EXTENDED_RELEASE_TABLET | Freq: Every day | ORAL | Status: DC
  Administered 2013-03-26 – 2013-03-27 (×2): 20 meq via ORAL
  Filled 2013-03-25 (×3): qty 1

## 2013-03-25 MED ORDER — CEFAZOLIN SODIUM-DEXTROSE 2-3 GM-% IV SOLR
2.0000 g | Freq: Four times a day (QID) | INTRAVENOUS | Status: AC
  Administered 2013-03-26 (×2): 2 g via INTRAVENOUS
  Filled 2013-03-25 (×2): qty 50

## 2013-03-25 MED ORDER — DIPHENHYDRAMINE HCL 12.5 MG/5ML PO ELIX: 12.5000 mg | ORAL_SOLUTION | ORAL | Status: DC | PRN

## 2013-03-25 MED ORDER — HYDROMORPHONE HCL PF 1 MG/ML IJ SOLN: 1.0000 mg | INTRAMUSCULAR | Status: DC | PRN

## 2013-03-25 MED ORDER — DEXAMETHASONE SODIUM PHOSPHATE 10 MG/ML IJ SOLN
10.0000 mg | Freq: Three times a day (TID) | INTRAMUSCULAR | Status: AC
  Administered 2013-03-26: 10 mg via INTRAVENOUS
  Filled 2013-03-25 (×3): qty 1

## 2013-03-25 MED ORDER — DOCUSATE SODIUM 100 MG PO CAPS
100.0000 mg | ORAL_CAPSULE | Freq: Two times a day (BID) | ORAL | Status: DC
  Administered 2013-03-25 – 2013-03-27 (×4): 100 mg via ORAL
  Filled 2013-03-25 (×4): qty 1

## 2013-03-25 MED ORDER — FUROSEMIDE 40 MG PO TABS
40.0000 mg | ORAL_TABLET | Freq: Every day | ORAL | Status: DC
Start: 2013-03-26 — End: 2013-03-27
  Administered 2013-03-26 – 2013-03-27 (×2): 40 mg via ORAL
  Filled 2013-03-25 (×2): qty 1

## 2013-03-25 MED ORDER — FENTANYL CITRATE 0.05 MG/ML IJ SOLN
INTRAMUSCULAR | Status: DC | PRN
  Administered 2013-03-25: 100 ug via INTRAVENOUS
  Administered 2013-03-25: 50 ug via INTRAVENOUS

## 2013-03-25 MED ORDER — ASPIRIN EC 325 MG PO TBEC
325.0000 mg | DELAYED_RELEASE_TABLET | Freq: Two times a day (BID) | ORAL | Status: DC
  Administered 2013-03-26 – 2013-03-27 (×3): 325 mg via ORAL
  Filled 2013-03-25 (×5): qty 1

## 2013-03-25 MED ORDER — ONDANSETRON HCL 4 MG PO TABS: 4.0000 mg | ORAL_TABLET | Freq: Four times a day (QID) | ORAL | Status: DC | PRN

## 2013-03-25 MED ORDER — GLYCOPYRROLATE 0.2 MG/ML IJ SOLN
INTRAMUSCULAR | Status: DC | PRN
  Administered 2013-03-25: 0.6 mg via INTRAVENOUS

## 2013-03-25 MED ORDER — FESOTERODINE FUMARATE ER 8 MG PO TB24
8.0000 mg | ORAL_TABLET | Freq: Every day | ORAL | Status: DC
  Administered 2013-03-26 – 2013-03-27 (×2): 8 mg via ORAL
  Filled 2013-03-25 (×2): qty 1

## 2013-03-25 SURGICAL SUPPLY — 73 items
APL SKNCLS STERI-STRIP NONHPOA (GAUZE/BANDAGES/DRESSINGS) ×1
BANDAGE ESMARK 6X9 LF (GAUZE/BANDAGES/DRESSINGS) ×1 IMPLANT
BENZOIN TINCTURE PRP APPL 2/3 (GAUZE/BANDAGES/DRESSINGS) ×2 IMPLANT
BLADE SAGITTAL 25.0X1.19X90 (BLADE) ×2 IMPLANT
BLADE SAW SAG 90X13X1.27 (BLADE) ×2 IMPLANT
BNDG CMPR 9X6 STRL LF SNTH (GAUZE/BANDAGES/DRESSINGS) ×1
BNDG CMPR MED 10X6 ELC LF (GAUZE/BANDAGES/DRESSINGS) ×1
BNDG ELASTIC 6X10 VLCR STRL LF (GAUZE/BANDAGES/DRESSINGS) ×1 IMPLANT
BNDG ESMARK 6X9 LF (GAUZE/BANDAGES/DRESSINGS) ×2
BOWL SMART MIX CTS (DISPOSABLE) ×2 IMPLANT
CAPT RP KNEE ×1 IMPLANT
CEMENT HV SMART SET (Cement) ×4 IMPLANT
CLOTH BEACON ORANGE TIMEOUT ST (SAFETY) ×1 IMPLANT
COVER SURGICAL LIGHT HANDLE (MISCELLANEOUS) ×2 IMPLANT
CUFF TOURNIQUET SINGLE 34IN LL (TOURNIQUET CUFF) ×2 IMPLANT
CUFF TOURNIQUET SINGLE 44IN (TOURNIQUET CUFF) IMPLANT
DRAPE EXTREMITY T 121X128X90 (DRAPE) ×2 IMPLANT
DRAPE U-SHAPE 47X51 STRL (DRAPES) ×2 IMPLANT
DRSG PAD ABDOMINAL 8X10 ST (GAUZE/BANDAGES/DRESSINGS) ×2 IMPLANT
DURAPREP 26ML APPLICATOR (WOUND CARE) ×2 IMPLANT
ELECT REM PT RETURN 9FT ADLT (ELECTROSURGICAL) ×2
ELECTRODE REM PT RTRN 9FT ADLT (ELECTROSURGICAL) ×1 IMPLANT
EVACUATOR 1/8 PVC DRAIN (DRAIN) ×2 IMPLANT
FACESHIELD LNG OPTICON STERILE (SAFETY) ×1 IMPLANT
GAUZE XEROFORM 5X9 LF (GAUZE/BANDAGES/DRESSINGS) ×2 IMPLANT
GLOVE BIO SURGEON STRL SZ 6.5 (GLOVE) ×1 IMPLANT
GLOVE BIOGEL PI IND STRL 6 (GLOVE) IMPLANT
GLOVE BIOGEL PI IND STRL 8 (GLOVE) ×2 IMPLANT
GLOVE BIOGEL PI IND STRL 8.5 (GLOVE) IMPLANT
GLOVE BIOGEL PI INDICATOR 6 (GLOVE) ×1
GLOVE BIOGEL PI INDICATOR 8 (GLOVE) ×2
GLOVE BIOGEL PI INDICATOR 8.5 (GLOVE) ×1
GLOVE BIOGEL PI ORTHO PRO SZ7 (GLOVE) ×1
GLOVE ECLIPSE 7.5 STRL STRAW (GLOVE) ×4 IMPLANT
GLOVE PI ORTHO PRO STRL SZ7 (GLOVE) IMPLANT
GLOVE SURG SS PI 7.0 STRL IVOR (GLOVE) ×1 IMPLANT
GOWN PREVENTION PLUS LG XLONG (DISPOSABLE) IMPLANT
GOWN STRL NON-REIN LRG LVL3 (GOWN DISPOSABLE) ×2 IMPLANT
GOWN STRL REIN XL XLG (GOWN DISPOSABLE) ×5 IMPLANT
HANDPIECE INTERPULSE COAX TIP (DISPOSABLE) ×2
HOOD PEEL AWAY FACE SHEILD DIS (HOOD) ×5 IMPLANT
IMMOBILIZER KNEE 20 (SOFTGOODS)
IMMOBILIZER KNEE 20 THIGH 36 (SOFTGOODS) IMPLANT
IMMOBILIZER KNEE 22 UNIV (SOFTGOODS) ×2 IMPLANT
IMMOBILIZER KNEE 24 THIGH 36 (MISCELLANEOUS) IMPLANT
IMMOBILIZER KNEE 24 UNIV (MISCELLANEOUS)
KIT BASIN OR (CUSTOM PROCEDURE TRAY) ×2 IMPLANT
KIT ROOM TURNOVER OR (KITS) ×2 IMPLANT
MANIFOLD NEPTUNE II (INSTRUMENTS) ×2 IMPLANT
NDL HYPO 25GX1X1/2 BEV (NEEDLE) IMPLANT
NEEDLE HYPO 25GX1X1/2 BEV (NEEDLE) IMPLANT
NS IRRIG 1000ML POUR BTL (IV SOLUTION) ×2 IMPLANT
PACK TOTAL JOINT (CUSTOM PROCEDURE TRAY) ×2 IMPLANT
PAD ARMBOARD 7.5X6 YLW CONV (MISCELLANEOUS) ×3 IMPLANT
PAD CAST 4YDX4 CTTN HI CHSV (CAST SUPPLIES) ×1 IMPLANT
PADDING CAST COTTON 4X4 STRL (CAST SUPPLIES) ×2
PADDING CAST COTTON 6X4 STRL (CAST SUPPLIES) ×1 IMPLANT
SET HNDPC FAN SPRY TIP SCT (DISPOSABLE) ×1 IMPLANT
SPONGE GAUZE 4X4 12PLY (GAUZE/BANDAGES/DRESSINGS) ×2 IMPLANT
STAPLER VISISTAT 35W (STAPLE) ×1 IMPLANT
STRIP CLOSURE SKIN 1/2X4 (GAUZE/BANDAGES/DRESSINGS) ×3 IMPLANT
SUCTION FRAZIER TIP 10 FR DISP (SUCTIONS) ×2 IMPLANT
SUT MON AB 3-0 SH 27 (SUTURE)
SUT MON AB 3-0 SH27 (SUTURE) IMPLANT
SUT VIC AB 0 CTB1 27 (SUTURE) ×4 IMPLANT
SUT VIC AB 1 CT1 27 (SUTURE) ×6
SUT VIC AB 1 CT1 27XBRD ANBCTR (SUTURE) ×2 IMPLANT
SUT VIC AB 2-0 CTB1 (SUTURE) ×4 IMPLANT
SYR CONTROL 10ML LL (SYRINGE) IMPLANT
TOWEL OR 17X24 6PK STRL BLUE (TOWEL DISPOSABLE) ×2 IMPLANT
TOWEL OR 17X26 10 PK STRL BLUE (TOWEL DISPOSABLE) ×2 IMPLANT
TRAY FOLEY CATH 16FRSI W/METER (SET/KITS/TRAYS/PACK) ×2 IMPLANT
WATER STERILE IRR 1000ML POUR (IV SOLUTION) ×3 IMPLANT

## 2013-03-25 NOTE — Preoperative (Signed)
Beta Blockers   Reason not to administer Beta Blockers:Not Applicable 

## 2013-03-25 NOTE — H&P (Signed)
TOTAL KNEE ADMISSION H&P  Patient is being admitted for left total knee arthroplasty.  Subjective:  Chief Complaint:left knee pain.  HPI: Heidi Young, 67 y.o. female, has a history of pain and functional disability in the left knee due to arthritis and has failed non-surgical conservative treatments for greater than 12 weeks to includeNSAID's and/or analgesics, corticosteriod injections, viscosupplementation injections, flexibility and strengthening excercises, weight reduction as appropriate and activity modification.  Onset of symptoms was gradual, starting 5 years ago with gradually worsening course since that time. The patient noted prior procedures on the knee to include  arthroscopy and menisectomy on the left knee(s).  Patient currently rates pain in the left knee(s) at 8 out of 10 with activity. Patient has night pain, worsening of pain with activity and weight bearing, pain that interferes with activities of daily living, pain with passive range of motion and joint swelling.  Patient has evidence of subchondral cysts, periarticular osteophytes and joint space narrowing by imaging studies. This patient has had failure of conservative care. There is no active infection.  Patient Active Problem List   Diagnosis Date Noted  . CARDIOMYOPATHY 04/13/2009  . HYPERLIPIDEMIA 04/12/2009  . HYPERTENSION 04/12/2009  . ASTHMA 04/12/2009  . DEGENERATIVE JOINT DISEASE 04/12/2009  . FIBROMYALGIA 04/12/2009  . CHEST PAIN-UNSPECIFIED 04/12/2009   Past Medical History  Diagnosis Date  . Chest pain   . Cardiomyopathy   . HTN (hypertension)   . Takotsubo cardiomyopathy   . HLD (hyperlipidemia)   . Asthma   . Fibromyalgia   . DJD (degenerative joint disease)   . Chronic anemia   . Obesity   . Carotid bruit     left  . Psychosis   . Lumbar herniated disc   . UTI (lower urinary tract infection)   . Schizophrenia   . Cancer     renal cell carcinoma  . Heart murmur   . Single kidney    . History of kidney cancer     unknown type. had R kidney removed b/c of CA.   Marland Kitchen Kidney stones   . GERD (gastroesophageal reflux disease)     on rare occas. - uses peptobismol    Past Surgical History  Procedure Laterality Date  . Nephrectomy  1990's    right, secondary to cyst  . Breast lumpectomy  90's    left, cyst  . Cystectomy  90's    back (left)  . Total knee arthroplasty  2005    right  . Cesarean section  1980    x1  . Cardiac catheterization  2010    no intervention    Prescriptions prior to admission  Medication Sig Dispense Refill  . albuterol (PROVENTIL HFA;VENTOLIN HFA) 108 (90 BASE) MCG/ACT inhaler Inhale 2 puffs into the lungs every 6 (six) hours as needed for wheezing.      Marland Kitchen amLODipine (NORVASC) 5 MG tablet Take 5 mg by mouth daily with breakfast.       . carvedilol (COREG) 25 MG tablet Take 25 mg by mouth 2 (two) times daily with a meal.      . cyclobenzaprine (FLEXERIL) 10 MG tablet Take 10 mg by mouth 3 (three) times daily as needed for muscle spasms.      . diclofenac sodium (VOLTAREN) 1 % GEL Apply 2 g topically 4 (four) times daily.      Marland Kitchen docusate sodium (COLACE) 100 MG capsule Take 500 mg by mouth daily.      . fesoterodine (TOVIAZ) 8 MG  TB24 tablet Take 8 mg by mouth daily with lunch.       . fish oil-omega-3 fatty acids 1000 MG capsule Take 1 g by mouth 2 (two) times daily.      . Fluticasone-Salmeterol (ADVAIR) 250-50 MCG/DOSE AEPB Inhale 1 puff into the lungs every 12 (twelve) hours.      . furosemide (LASIX) 40 MG tablet Take 40 mg by mouth daily.      . Multiple Vitamin (MULTIVITAMIN WITH MINERALS) TABS tablet Take 1 tablet by mouth daily.      . potassium chloride SA (K-DUR,KLOR-CON) 20 MEQ tablet Take 20 mEq by mouth daily.      . pravastatin (PRAVACHOL) 40 MG tablet Take 40 mg by mouth daily after supper.       . St Johns Wort 300 MG CAPS Take 300 mg by mouth 3 (three) times daily.       . traMADol (ULTRAM) 50 MG tablet Take 50 mg by mouth  every 6 (six) hours as needed for pain.      Marland Kitchen aspirin 81 MG tablet Take 81 mg by mouth daily.       Allergies  Allergen Reactions  . Avelox [Moxifloxacin Hydrochloride]     RASH  . Bee Venom     History  Substance Use Topics  . Smoking status: Never Smoker   . Smokeless tobacco: Not on file  . Alcohol Use: No     Comment: rare    Family History  Problem Relation Age of Onset  . Coronary artery disease Mother 34  . Heart failure Mother     congestive  . Stroke Mother   . Brain cancer Father 12  . Hypertension      siblings  . Aortic stenosis      sibling     ROS ROS: I have reviewed the patient's review of systems thoroughly and there are no positive responses as relates to the HPI. Objective:  Physical Exam  Vital signs in last 24 hours: Temp:  [97.5 F (36.4 C)] 97.5 F (36.4 C) (10/08 1127) Pulse Rate:  [74] 74 (10/08 1127) Resp:  [10-18] 10 (10/08 1216) BP: (152)/(71) 152/71 mmHg (10/08 1127) SpO2:  [100 %] 100 % (10/08 1127) Well-developed well-nourished patient in no acute distress. Alert and oriented x3 HEENT:within normal limits Cardiac: Regular rate and rhythm Pulmonary: Lungs clear to auscultation Abdomen: Soft and nontender.  Normal active bowel sounds  Musculoskeletal: l knee rom o-115 no instability mod med jt line tender Labs: Recent Results (from the past 2160 hour(s))  PAP IG W/ RFLX HPV ASCU     Status: None   Collection Time    02/19/13  9:59 AM      Result Value Range   Specimen adequacy:       Comment: SATISFACTORY.  Endocervical/transformation zone component present.   FINAL DIAGNOSIS:       Comment: -     NEGATIVE FOR INTRAEPITHELIAL LESIONS OR MALIGNANCY.   Cytotechnologist:       Comment: Sandre Kitty; CT (HEW)     *     The Pap smear is a screening test used to detect cervical cancer and     its precursors.  It should not be used as the sole means to detect     cervical cancer.  Test results should be correlated with  clinical     findings.  The Pap test is unreliable for detecting endometrial     lesions and should not  be used to evaluate endometrial abnormalities.     Data indicate the Pap test is subject to false negative and false     positive results.  Therefore, periodic repeat testing and follow-up of     any unexplained clinical signs and symptoms are recommended.           Dr. Elisha Ponder, Cytology Medical Director  WET PREP BY MOLECULAR PROBE     Status: Abnormal   Collection Time    02/19/13  9:59 AM      Result Value Range   Candida species NEG  Negative   Trichomonas vaginosis NEG  Negative   Gardnerella vaginalis POS (*) Negative  URINALYSIS, ROUTINE W REFLEX MICROSCOPIC     Status: None   Collection Time    03/23/13  3:23 PM      Result Value Range   Color, Urine YELLOW  YELLOW   APPearance CLEAR  CLEAR   Specific Gravity, Urine 1.010  1.005 - 1.030   pH 5.0  5.0 - 8.0   Glucose, UA NEGATIVE  NEGATIVE mg/dL   Hgb urine dipstick NEGATIVE  NEGATIVE   Bilirubin Urine NEGATIVE  NEGATIVE   Ketones, ur NEGATIVE  NEGATIVE mg/dL   Protein, ur NEGATIVE  NEGATIVE mg/dL   Urobilinogen, UA 0.2  0.0 - 1.0 mg/dL   Nitrite NEGATIVE  NEGATIVE   Leukocytes, UA NEGATIVE  NEGATIVE   Comment: MICROSCOPIC NOT DONE ON URINES WITH NEGATIVE PROTEIN, BLOOD, LEUKOCYTES, NITRITE, OR GLUCOSE <1000 mg/dL.  SURGICAL PCR SCREEN     Status: None   Collection Time    03/23/13  3:23 PM      Result Value Range   MRSA, PCR NEGATIVE  NEGATIVE   Staphylococcus aureus NEGATIVE  NEGATIVE   Comment:            The Xpert SA Assay (FDA     approved for NASAL specimens     in patients over 62 years of age),     is one component of     a comprehensive surveillance     program.  Test performance has     been validated by [employer] for patients greater     than or equal to 2 year old.     It is not intended     to diagnose infection nor to     guide or monitor treatment.  APTT     Status: None    Collection Time    03/23/13  4:02 PM      Result Value Range   aPTT 33  24 - 37 seconds  CBC WITH DIFFERENTIAL     Status: Abnormal   Collection Time    03/23/13  4:02 PM      Result Value Range   WBC 4.9  4.0 - 10.5 K/uL   RBC 4.05  3.87 - 5.11 MIL/uL   Hemoglobin 11.0 (*) 12.0 - 15.0 g/dL   HCT 95.6 (*) 21.3 - 08.6 %   MCV 83.0  78.0 - 100.0 fL   MCH 27.2  26.0 - 34.0 pg   MCHC 32.7  30.0 - 36.0 g/dL   RDW 57.8  46.9 - 62.9 %   Platelets 285  150 - 400 K/uL   Neutrophils Relative % 45  43 - 77 %   Neutro Abs 2.2  1.7 - 7.7 K/uL   Lymphocytes Relative 40  12 - 46 %   Lymphs Abs 1.9  0.7 -  4.0 K/uL   Monocytes Relative 9  3 - 12 %   Monocytes Absolute 0.4  0.1 - 1.0 K/uL   Eosinophils Relative 6 (*) 0 - 5 %   Eosinophils Absolute 0.3  0.0 - 0.7 K/uL   Basophils Relative 1  0 - 1 %   Basophils Absolute 0.0  0.0 - 0.1 K/uL  COMPREHENSIVE METABOLIC PANEL     Status: Abnormal   Collection Time    03/23/13  4:02 PM      Result Value Range   Sodium 141  135 - 145 mEq/L   Potassium 3.6  3.5 - 5.1 mEq/L   Chloride 106  96 - 112 mEq/L   CO2 22  19 - 32 mEq/L   Glucose, Bld 99  70 - 99 mg/dL   BUN 24 (*) 6 - 23 mg/dL   Creatinine, Ser 4.69 (*) 0.50 - 1.10 mg/dL   Calcium 62.9  8.4 - 52.8 mg/dL   Total Protein 7.6  6.0 - 8.3 g/dL   Albumin 4.3  3.5 - 5.2 g/dL   AST 42 (*) 0 - 37 U/L   ALT 36 (*) 0 - 35 U/L   Alkaline Phosphatase 94  39 - 117 U/L   Total Bilirubin 0.4  0.3 - 1.2 mg/dL   GFR calc non Af Amer 42 (*) >90 mL/min   GFR calc Af Amer 49 (*) >90 mL/min   Comment: (NOTE)     The eGFR has been calculated using the CKD EPI equation.     This calculation has not been validated in all clinical situations.     eGFR's persistently <90 mL/min signify possible Chronic Kidney     Disease.  PROTIME-INR     Status: None   Collection Time    03/23/13  4:02 PM      Result Value Range   Prothrombin Time 13.6  11.6 - 15.2 seconds   INR 1.06  0.00 - 1.49  TYPE AND SCREEN      Status: None   Collection Time    03/23/13  4:05 PM      Result Value Range   ABO/RH(D) O POS     Antibody Screen NEG     Sample Expiration 04/06/2013      Estimated body mass index is 39.65 kg/(m^2) as calculated from the following:   Height as of 03/23/13: 5\' 7"  (1.702 m).   Weight as of 02/18/13: 114.851 kg (253 lb 3.2 oz).   Imaging Review Plain radiographs demonstrate severe degenerative joint disease of the left knee(s). The overall alignment ismild varus. The bone quality appears to be good for age and reported activity level.  Assessment/Plan:  End stage arthritis, left knee   The patient history, physical examination, clinical judgment of the provider and imaging studies are consistent with end stage degenerative joint disease of the left knee(s) and total knee arthroplasty is deemed medically necessary. The treatment options including medical management, injection therapy arthroscopy and arthroplasty were discussed at length. The risks and benefits of total knee arthroplasty were presented and reviewed. The risks due to aseptic loosening, infection, stiffness, patella tracking problems, thromboembolic complications and other imponderables were discussed. The patient acknowledged the explanation, agreed to proceed with the plan and consent was signed. Patient is being admitted for inpatient treatment for surgery, pain control, PT, OT, prophylactic antibiotics, VTE prophylaxis, progressive ambulation and ADL's and discharge planning. The patient is planning to be discharged to skilled nursing facility

## 2013-03-25 NOTE — Progress Notes (Signed)
Orthopedic Tech Progress Note Patient Details:  Heidi Young 07/16/1945 [id number]  CPM Left Knee CPM Left Knee: On Left Knee Flexion (Degrees): 6 Left Knee Extension (Degrees): 0 Additional Comments: will provide ohf when one becomes available   [name] 03/25/2013, 4:04 PM

## 2013-03-25 NOTE — Anesthesia Postprocedure Evaluation (Signed)
Anesthesia Post Note  Patient: Heidi Young  Procedure(s) Performed: Procedure(s) (LRB): TOTAL KNEE ARTHROPLASTY (Left)  Anesthesia type: General  Patient location: PACU  Post pain: Pain level controlled  Post assessment: Patient's Cardiovascular Status Stable  Last Vitals:  Filed Vitals:   03/25/13 1515  BP: 135/81  Pulse: 85  Temp:   Resp: 15    Post vital signs: Reviewed and stable  Level of consciousness: alert  Complications: No apparent anesthesia complications

## 2013-03-25 NOTE — Anesthesia Preprocedure Evaluation (Signed)
Anesthesia Evaluation  Patient identified by MRN, date of birth, ID band Patient awake    Reviewed: Allergy & Precautions, H&P , NPO status , Patient's Chart, lab work & pertinent test results  Airway Mallampati: I TM Distance: >3 FB Neck ROM: full    Dental   Pulmonary asthma ,          Cardiovascular hypertension, Rhythm:regular Rate:Normal     Neuro/Psych PSYCHIATRIC DISORDERS  Neuromuscular disease    GI/Hepatic GERD-  ,  Endo/Other    Renal/GU Renal disease     Musculoskeletal  (+) Fibromyalgia -  Abdominal   Peds  Hematology  (+) anemia ,   Anesthesia Other Findings   Reproductive/Obstetrics                           Anesthesia Physical Anesthesia Plan  ASA: III  Anesthesia Plan: General   Post-op Pain Management:    Induction: Intravenous  Airway Management Planned: Oral ETT  Additional Equipment:   Intra-op Plan:   Post-operative Plan: Extubation in OR  Informed Consent: I have reviewed the patients History and Physical, chart, labs and discussed the procedure including the risks, benefits and alternatives for the proposed anesthesia with the patient or authorized representative who has indicated his/her understanding and acceptance.     Plan Discussed with: CRNA, Anesthesiologist and Surgeon  Anesthesia Plan Comments:         Anesthesia Quick Evaluation

## 2013-03-25 NOTE — Brief Op Note (Signed)
03/25/2013  2:35 PM  PATIENT:  Redgie Grayer  67 y.o. female  PRE-OPERATIVE DIAGNOSIS:  degenerative joint disease, left knee  POST-OPERATIVE DIAGNOSIS:  degenerative joint disease, left knee  PROCEDURE:  Procedure(s): TOTAL KNEE ARTHROPLASTY (Left)  SURGEON:  Surgeon(s) and Role:    * Harvie Junior, MD - Primary  PHYSICIAN ASSISTANT:   ASSISTANTS: bethune   ANESTHESIA:   general  EBL:  Total I/O In: -  Out: 225 [Urine:75; Blood:150]  BLOOD ADMINISTERED:none  DRAINS: (1) Hemovact drain(s) in the l knee with  Suction Open   LOCAL MEDICATIONS USED:  MARCAINE     SPECIMEN:  No Specimen  DISPOSITION OF SPECIMEN:  N/A  COUNTS:  YES  TOURNIQUET:   Total Tourniquet Time Documented: Thigh (Left) - 67 minutes Total: Thigh (Left) - 67 minutes   DICTATION: .Other Dictation: Dictation Number [id number]  PLAN OF CARE: Admit to inpatient   PATIENT DISPOSITION:  PACU - hemodynamically stable.   Delay start of Pharmacological VTE agent (>24hrs) due to surgical blood loss or risk of bleeding: no

## 2013-03-25 NOTE — Transfer of Care (Signed)
Immediate Anesthesia Transfer of Care Note  Patient: Heidi Young  Procedure(s) Performed: Procedure(s): TOTAL KNEE ARTHROPLASTY (Left)  Patient Location: PACU  Anesthesia Type:General and Regional  Level of Consciousness: awake, alert  and sedated  Airway & Oxygen Therapy: Patient connected to face mask oxygen  Post-op Assessment: Report given to PACU RN  Post vital signs: stable  Complications: No apparent anesthesia complications

## 2013-03-26 ENCOUNTER — Encounter (HOSPITAL_COMMUNITY): Payer: Self-pay | Admitting: *Deleted

## 2013-03-26 LAB — CBC
HCT: 28 % — ABNORMAL LOW (ref 36.0–46.0)
Hemoglobin: 9.2 g/dL — ABNORMAL LOW (ref 12.0–15.0)
MCH: 27.1 pg (ref 26.0–34.0)
Platelets: 258 10*3/uL (ref 150–400)
RBC: 3.39 MIL/uL — ABNORMAL LOW (ref 3.87–5.11)

## 2013-03-26 LAB — BASIC METABOLIC PANEL
BUN: 23 mg/dL (ref 6–23)
CO2: 21 mEq/L (ref 19–32)
Calcium: 8.7 mg/dL (ref 8.4–10.5)
GFR calc non Af Amer: 43 mL/min — ABNORMAL LOW (ref >90)
Glucose, Bld: 123 mg/dL — ABNORMAL HIGH (ref 70–99)
Potassium: 4.2 mEq/L (ref 3.5–5.1)
Sodium: 138 mEq/L (ref 135–145)

## 2013-03-26 MED ORDER — PNEUMOCOCCAL VAC POLYVALENT 25 MCG/0.5ML IJ INJ
0.5000 mL | INJECTION | INTRAMUSCULAR | Status: AC
  Administered 2013-03-27: 0.5 mL via INTRAMUSCULAR
  Filled 2013-03-26: qty 0.5

## 2013-03-26 NOTE — Progress Notes (Signed)
Physical Therapy Treatment Patient Details Name: Heidi Young MRN: [id number] DOB: 1946/05/30 Today's Date: 03/26/2013 Time: 4098-1191 PT Time Calculation (min): 25 min  PT Assessment / Plan / Recommendation  History of Present Illness Pt is a 67 y/o female admitted s/p L TKA on 03/25/2013.   PT Comments   Pt able to negotiate the bathroom well to void and perform hygiene. She required decreased assist during functional mobility, and demonstrated improve technique during gait training. Overall progressing well towards physical therapy goals.  Follow Up Recommendations  Home health PT     Does the patient have the potential to tolerate intense rehabilitation     Barriers to Discharge        Equipment Recommendations  3in1 (PT)    Recommendations for Other Services    Frequency 7X/week   Progress towards PT Goals Progress towards PT goals: Progressing toward goals  Plan Current plan remains appropriate    Precautions / Restrictions Precautions Precautions: Knee;Fall Precaution Comments: Knee immobilizer d/c as pt demonstrated x10 SLR Required Braces or Orthoses: Knee Immobilizer - Left Knee Immobilizer - Left: On when out of bed or walking Restrictions Weight Bearing Restrictions: Yes LLE Weight Bearing: Weight bearing as tolerated   Pertinent Vitals/Pain Pt reports 2/10 pain    Mobility  Bed Mobility Bed Mobility: Supine to Sit;Sitting - Scoot to [place] of Bed;Sit to Supine;Scooting to [facility] Supine to Sit: 5: Supervision;With rails;HOB elevated Sitting - Scoot to Edge of Bed: 5: Supervision;With rail Sit to Supine: 5: Supervision;HOB flat;With rail Scooting to [facility]: 5: Supervision (S/L) Details for Bed Mobility Assistance: VC's for safety awareness and use of bed rails for support. Transfers Transfers: Sit to Stand;Stand to Sit Sit to Stand: 4: Min guard;From bed;With upper extremity assist;From toilet;With armrests Stand to Sit: 4: Min guard;To toilet;With  armrests Details for Transfer Assistance: VC's for sequencing and safety awareness with the walker. Cues also for hand placement on seated surface prior to initiating transfers. Ambulation/Gait Ambulation/Gait Assistance: 4: Min assist Ambulation Distance (Feet): 45 Feet Assistive device: Rolling walker Ambulation/Gait Assistance Details: VC's for sequencing with the RW, WBAT status on L, and increased heel strike. Gait Pattern: Step-to pattern;Decreased stride length Gait velocity: decreased Stairs: No    Exercises Total Joint Exercises Ankle Circles/Pumps: 10 reps Quad Sets: 10 reps Heel Slides: 10 reps Straight Leg Raises: 10 reps Long Arc Quad: 10 reps   PT Diagnosis:    PT Problem List:   PT Treatment Interventions:     PT Goals (current goals can now be found in the care plan section) Acute Rehab PT Goals Patient Stated Goal: To return home with son PT Goal Formulation: With patient Time For Goal Achievement: 04/02/13 Potential to Achieve Goals: Good  Visit Information  Last PT Received On: 03/26/13 Assistance Needed: +1 History of Present Illness: Pt is a 67 y/o female admitted s/p L TKA on 03/25/2013.    Subjective Data  Subjective: "I feel good." Patient Stated Goal: To return home with son   Cognition  Cognition Arousal/Alertness: Awake/alert Behavior During Therapy: WFL for tasks assessed/performed Overall Cognitive Status: Within Functional Limits for tasks assessed    Balance  Balance Balance Assessed: Yes Static Sitting Balance Static Sitting - Balance Support: Bilateral upper extremity supported;Feet supported Static Sitting - Level of Assistance: 5: Stand by assistance Static Sitting - Comment/# of Minutes: 3  End of Session PT - End of Session Equipment Utilized During Treatment: Gait belt Activity Tolerance: Patient tolerated treatment well Patient left: in  bed;with call bell/phone within reach Nurse Communication: Mobility status   GP      Ruthann Cancer 03/26/2013, 4:34 PM  Ruthann Cancer, PT, DPT Acute Rehabilitation Services

## 2013-03-26 NOTE — Progress Notes (Signed)
UR COMPLETED  

## 2013-03-26 NOTE — Progress Notes (Signed)
Subjective: 1 Day Post-Op Procedure(s) (LRB): TOTAL KNEE ARTHROPLASTY (Left) Patient reports pain as mild.    Objective: Vital signs in last 24 hours: Temp:  [97.5 F (36.4 C)-98.5 F (36.9 C)] 98.2 F (36.8 C) (10/09 0607) Pulse Rate:  [67-110] 90 (10/09 0607) Resp:  [10-25] 16 (10/09 0607) BP: (115-152)/(57-87) 128/61 mmHg (10/09 0607) SpO2:  [89 %-100 %] 99 % (10/09 0607)  Intake/Output from previous day: 10/08 0701 - 10/09 0700 In: 1000 [I.V.:1000] Out: 765 [Urine:475; Drains:140; Blood:150] Intake/Output this shift:     Recent Labs  03/23/13 1602  HGB 11.0*    Recent Labs  03/23/13 1602  WBC 4.9  RBC 4.05  HCT 33.6*  PLT 285    Recent Labs  03/23/13 1602  NA 141  K 3.6  CL 106  CO2 22  BUN 24*  CREATININE 1.29*  GLUCOSE 99  CALCIUM 10.0    Recent Labs  03/23/13 1602  INR 1.06    Neurologically intact ABD soft Neurovascular intact Sensation intact distally Intact pulses distally No cellulitis present Compartment soft  Assessment/Plan: 1 Day Post-Op Procedure(s) (LRB): TOTAL KNEE ARTHROPLASTY (Left) Advance diet Up with therapy Plan oob X2 today and if continues doing well may reconsider plan for SNF but will decide later today or tomorrow Will check labs later today Drain pulled today  Heidi Young L 03/26/2013, 8:04 AM

## 2013-03-26 NOTE — Op Note (Signed)
NAME:  Heidi Young, Heidi Young NO.:  [id number]  MEDICAL RECORD NO.:  [id number]  LOCATION:  [id number]                        FACILITY:  [facility]  PHYSICIAN:  Harvie Junior, M.D.   DATE OF BIRTH:  1946-03-23  DATE OF PROCEDURE:  03/25/2013 DATE OF DISCHARGE:                              OPERATIVE REPORT   PREOPERATIVE DIAGNOSIS:  End-stage degenerative joint disease, left knee.  POSTOPERATIVE DIAGNOSIS:  End-stage degenerative joint disease, left knee.  PRINCIPAL PROCEDURE:  Left total knee replacement with a Sigma system, size 3 femur, size 4 tibia, 12.5-mm bridging bearing, and a 35-mm all- polyethylene patella.  SURGEON:  Harvie Junior, M.D.  ASSISTANT:  Marshia Ly, P.A.  ANESTHESIA:  General.  BRIEF HISTORY:  Heidi Young is a 67 year old female with long history of having significant complaints of left knee pain.  The x-rays showed bone-on-bone changes.  She was having night pain, light activity pain and failed all conservative care including activity modification, injection therapy, and anti-inflammatory medication.  After failure of all conservative care, she was taken to the operating room for left total knee replacement.  DESCRIPTION OF PROCEDURE:  The patient was taken to the operating room. After the adequate anesthesia was obtained with general anesthetic, the patient was placed supine on the operating table.  Left leg was then prepped and draped in the usual sterile fashion.  Following this, the leg was exsanguinated.  Blood pressure tourniquet was inflated to 350 mmHg.  Following this, a midline incision was made, subcutaneous tissue down to the level of extensor mechanism and medial parapatellar arthrotomy was undertaken.  Following this, medial and lateral meniscus were removed.  Retropatellar fat pad, synovium in the anterior aspect of the femur, and anterior and posterior cruciates.  The tibia was then exposed and then it was cut perpendicular  to its long axis with an extramedullary guide.  Attention was then turned to the femur where an intramedullary pilot hole was drilled and 10 mm of distal femur was resected and at this point, spacer blocks were put in place, little tight at this point, and so we put the tibial cutting block back in and cut two more of the tibia.  At this point, we had a nice flexion gap. Attention was then turned to the femur, which was sized to a 3.  We did anterior and posterior cuts, chamfers and box, turned to the tibia.  It sized to a 4.  We drilled and keeled it and turned to the patella, cut it down to a level of 14 and did a 35 was the appropriate size and this lugs were drilled for this, and lugs were drilled for the femur.  Once the trials were put in place, size 4 tibia, size 3 femur, and 35 all- poly patella trials were placed, excellent stability, range of motion at this point.  Attention was turned towards removal of the trial components and knee was then copiously and thoroughly lavaged and suctioned dry.  Following this, the final components were cemented in place, size 4 tibia, size 3 femur, 10-mm bridging bearing trial was placed and a 35 all-poly patella held with a clamp.  All excess bone cement was removed, and at  this point, we checked the range of motion and stability after the cement was completely hardened.  I felt this was loose in the mid-flexion range, we tried a 12.5 poly and this actually gave a very nice feel to the overall range of motion.  So, final 12.5 was opened and placed.  Medium Hemovac drain was placed.  The medial parapatellar arthrotomy was closed with a #1 Vicryl running, tourniquet was let down after the cement hardened and all bleedings were controlled with electrocautery.  Once the final poly was placed, the medial parapatellar arthrotomy was closed with a #1 Vicryl running, skin with 0 and 2-0 Vicryl, and 3-0 Monocryl subcuticular.  Benzoin and  Steri-Strips were applied.  Sterile compressive dressing was applied, and the patient was taken to the recovery room, she was noted to be in satisfactory condition.  The medium Hemovac drain had been placed prior to deep closure.  The estimated blood loss for the procedure was none and complication none.     Harvie Junior, M.D.     Ranae Plumber  D:  03/25/2013  T:  03/26/2013  Job:  [id number]

## 2013-03-26 NOTE — Evaluation (Signed)
Physical Therapy Evaluation Patient Details Name: Heidi Young MRN: [id number] DOB: Oct 31, 1945 Today's Date: 03/26/2013 Time: 4098-1191 PT Time Calculation (min): 26 min  PT Assessment / Plan / Recommendation History of Present Illness  Pt is a 67 y/o female admitted s/p L TKA on 03/25/2013.  Clinical Impression  Pt presents with a decline in function after L TKA. Pt wishes to d/c to son's home, however may have a flight of stairs to negotiate to get to/from the bedroom and full bathroom. After previous knee replacement, pt went to short term rehab because she lives alone. At the time of PT eval, pt required min-mod assist for functional mobility with RW. This patient is appropriate for skilled PT interventions to address functional limitations, improve safety and independence with functional mobility, and return to PLOF.    PT Assessment  Patient needs continued PT services    Follow Up Recommendations  Home health PT    Does the patient have the potential to tolerate intense rehabilitation      Barriers to Discharge Inaccessible home environment      Equipment Recommendations  3in1 (PT)    Recommendations for Other Services     Frequency 7X/week    Precautions / Restrictions Precautions Precautions: Knee;Fall Required Braces or Orthoses: Knee Immobilizer - Left Knee Immobilizer - Left: On when out of bed or walking Restrictions Weight Bearing Restrictions: Yes LLE Weight Bearing: Weight bearing as tolerated   Pertinent Vitals/Pain Pt reports 2/10 pain at rest.O2 saturation on RA after ambulation to chair was 96%      Mobility  Bed Mobility Bed Mobility: Supine to Sit;Sitting - Scoot to Edge of Bed Supine to Sit: 3: Mod assist;HOB flat;With rails Sitting - Scoot to Edge of Bed: 4: Min assist;With rail Details for Bed Mobility Assistance: VC's for sequencing and safety awareness. Assist for trunk support to sit upright and for LE support to  EOB. Transfers Transfers: Sit to Stand;Stand to Sit Sit to Stand: 4: Min assist;From bed;With upper extremity assist Stand to Sit: 4: Min assist;To chair/3-in-1;With armrests Details for Transfer Assistance: VC's for sequencing and safety awareness with the walker. Cues also for hand placement on seated surface prior to initiating transfers. Ambulation/Gait Ambulation/Gait Assistance: 4: Min assist Ambulation Distance (Feet): 10 Feet Assistive device: Rolling walker Ambulation/Gait Assistance Details: VC's for sequencing with the RW and WB status on L LE. Gait Pattern: Step-to pattern;Decreased stride length;Shuffle;Narrow base of support Stairs: No    Exercises Total Joint Exercises Ankle Circles/Pumps: 10 reps Quad Sets: 10 reps Heel Slides: 10 reps;Seated Goniometric ROM: 80   PT Diagnosis: Difficulty walking;Acute pain  PT Problem List: Decreased strength;Decreased activity tolerance;Decreased range of motion;Decreased balance;Decreased mobility;Decreased knowledge of use of DME;Decreased safety awareness;Decreased knowledge of precautions;Pain PT Treatment Interventions: DME instruction;Gait training;Stair training;Functional mobility training;Therapeutic activities;Therapeutic exercise;Neuromuscular re-education;Patient/family education     PT Goals(Current goals can be found in the care plan section) Acute Rehab PT Goals Patient Stated Goal: To return home with son PT Goal Formulation: With patient Time For Goal Achievement: 04/02/13 Potential to Achieve Goals: Good  Visit Information  Last PT Received On: 03/26/13 Assistance Needed: +1 History of Present Illness: Pt is a 67 y/o female admitted s/p L TKA on 03/25/2013.       Prior Functioning  Home Living Family/patient expects to be discharged to:: Private residence Living Arrangements: Children (Planning to d/c with son) Available Help at Discharge: Family;Available 24 hours/day Type of Home: House Home Access:  Stairs to enter [employer]  of Steps: 5 Entrance Stairs-Rails: None Home Layout: Two level;1/2 bath on main level Alternate Level Stairs-Number of Steps: 13 Alternate Level Stairs-Rails: Right Home Equipment: Walker - 2 wheels;Cane - single point Prior Function Level of Independence: Independent with assistive device(s) Communication Communication: No difficulties Dominant Hand: Right    Cognition  Cognition Arousal/Alertness: Awake/alert Behavior During Therapy: WFL for tasks assessed/performed Overall Cognitive Status: Within Functional Limits for tasks assessed    Extremity/Trunk Assessment Upper Extremity Assessment Upper Extremity Assessment: Defer to OT evaluation Lower Extremity Assessment Lower Extremity Assessment: LLE deficits/detail LLE Deficits / Details: Decreased strength and AROM consistent with L TKA. Cervical / Trunk Assessment Cervical / Trunk Assessment: Normal   Balance Balance Balance Assessed: Yes Static Sitting Balance Static Sitting - Balance Support: Bilateral upper extremity supported;Feet supported Static Sitting - Level of Assistance: 5: Stand by assistance Static Sitting - Comment/# of Minutes: 2  End of Session PT - End of Session Equipment Utilized During Treatment: Gait belt Activity Tolerance: Patient tolerated treatment well Patient left: in chair;with call bell/phone within reach Nurse Communication: Mobility status CPM Left Knee CPM Left Knee: Off  GP     Ruthann Cancer 03/26/2013, 12:16 PM  Ruthann Cancer, PT, DPT Acute Rehabilitation Services

## 2013-03-26 NOTE — Progress Notes (Signed)
03/26/13 Set up with HHPT with [facility] by MD office. Spoke with patient, d/c plan has changed to SNF. Referral made to CSW. Jacquelynn Cree RN, BSN, CCM

## 2013-03-26 NOTE — Progress Notes (Signed)
Patient is requesting to go to [facility]. CSW has sent all clinicals to [facility].  Sabino Niemann, MSW, [employer] [phone]

## 2013-03-27 ENCOUNTER — Encounter (HOSPITAL_COMMUNITY): Payer: Self-pay | Admitting: Orthopedic Surgery

## 2013-03-27 LAB — CBC
Hemoglobin: 9 g/dL — ABNORMAL LOW (ref 12.0–15.0)
MCH: 27.1 pg (ref 26.0–34.0)
MCV: 82.5 fL (ref 78.0–100.0)
RBC: 3.32 MIL/uL — ABNORMAL LOW (ref 3.87–5.11)
WBC: 17.5 10*3/uL — ABNORMAL HIGH (ref 4.0–10.5)

## 2013-03-27 LAB — BASIC METABOLIC PANEL
CO2: 22 mEq/L (ref 19–32)
Calcium: 8.5 mg/dL (ref 8.4–10.5)
Creatinine, Ser: 1.05 mg/dL (ref 0.50–1.10)
Glucose, Bld: 143 mg/dL — ABNORMAL HIGH (ref 70–99)
Sodium: 140 mEq/L (ref 135–145)

## 2013-03-27 MED ORDER — METHOCARBAMOL 500 MG PO TABS: 500.0000 mg | ORAL_TABLET | Freq: Three times a day (TID) | ORAL | Status: DC

## 2013-03-27 MED ORDER — OXYCODONE-ACETAMINOPHEN 5-325 MG PO TABS: 1.0000 | ORAL_TABLET | Freq: Four times a day (QID) | ORAL | Status: DC | PRN

## 2013-03-27 MED ORDER — ASPIRIN 325 MG PO TBEC: 325.0000 mg | DELAYED_RELEASE_TABLET | Freq: Two times a day (BID) | ORAL | Status: DC

## 2013-03-27 NOTE — Progress Notes (Signed)
Subjective: 2 Days Post-Op Procedure(s) (LRB): TOTAL KNEE ARTHROPLASTY (Left) Patient reports pain as 4 on 0-10 scale.  Progressing with PT. Pt lives alone.   Objective: Vital signs in last 24 hours: Temp:  [97.6 F (36.4 C)-98.5 F (36.9 C)] 98.1 F (36.7 C) (10/10 0513) Pulse Rate:  [83-99] 83 (10/10 0513) Resp:  [16-20] 20 (10/10 0513) BP: (135-151)/(67-82) 151/82 mmHg (10/10 0513) SpO2:  [99 %-100 %] 100 % (10/10 0513)  Intake/Output from previous day: 10/09 0701 - 10/10 0700 In: 2060 [P.O.:1220; I.V.:840] Out: 2200 [Urine:2200] Intake/Output this shift:     Recent Labs  03/26/13 0500 03/27/13 0530  HGB 9.2* 9.0*    Recent Labs  03/26/13 0500 03/27/13 0530  WBC 14.7* 17.5*  RBC 3.39* 3.32*  HCT 28.0* 27.4*  PLT 258 269    Recent Labs  03/26/13 0500 03/27/13 0530  NA 138 140  K 4.2 4.1  CL 103 105  CO2 21 22  BUN 23 21  CREATININE 1.27* 1.05  GLUCOSE 123* 143*  CALCIUM 8.7 8.5   Left knee exam: Neurovascular intact Sensation intact distally Intact pulses distally Dorsiflexion/Plantar flexion intact Incision: no drainage Compartment soft  Assessment/Plan: 2 Days Post-Op Procedure(s) (LRB): TOTAL KNEE ARTHROPLASTY (Left) Plan: Pt lives alone   She has stairs at her home. Discharge to SNF  Shakema Surita G 03/27/2013, 8:07 AM

## 2013-03-27 NOTE — Discharge Summary (Signed)
Patient ID: Heidi Young MRN: [id number] DOB/AGE: [birth year] 67 y.o.  Admit date: 03/25/2013 Discharge date: 03/27/2013  Admission Diagnoses:  Principal Problem:   Osteoarthritis of left knee   Discharge Diagnoses:  Same  Past Medical History  Diagnosis Date  . Chest pain   . Cardiomyopathy   . HTN (hypertension)   . Takotsubo cardiomyopathy   . HLD (hyperlipidemia)   . Asthma   . Fibromyalgia   . DJD (degenerative joint disease)   . Chronic anemia   . Obesity   . Carotid bruit     left  . Psychosis   . Lumbar herniated disc   . UTI (lower urinary tract infection)   . Schizophrenia   . Cancer     renal cell carcinoma  . Heart murmur   . Single kidney   . History of kidney cancer     unknown type. had R kidney removed b/c of CA.   Marland Kitchen Kidney stones   . GERD (gastroesophageal reflux disease)     on rare occas. - uses peptobismol    Surgeries: Procedure(s):Left TOTAL KNEE ARTHROPLASTY on 03/25/2013    Discharged Condition: Improved  Hospital Course: Jaianna Nicoll is an 67 y.o. female who was admitted 03/25/2013 for operative treatment ofOsteoarthritis of left knee. Patient has severe unremitting pain that affects sleep, daily activities, and work/hobbies. After pre-op clearance the patient was taken to the operating room on 03/25/2013 and underwent  Procedure(s):Left TOTAL KNEE ARTHROPLASTY.    Patient was given perioperative antibiotics: Anti-infectives   Start     Dose/Rate Route Frequency Ordered Stop   03/25/13 2300  ceFAZolin (ANCEF) IVPB 2 g/50 mL premix     2 g 100 mL/hr over 30 Minutes Intravenous Every 6 hours 03/25/13 2202 03/26/13 0604   03/25/13 0600  ceFAZolin (ANCEF) IVPB 2 g/50 mL premix     2 g 100 mL/hr over 30 Minutes Intravenous On call to O.R. 03/24/13 1504 03/25/13 1255       Patient was given sequential compression devices, early ambulation, and chemoprophylaxis to prevent DVT.  Patient benefited maximally from hospital stay and  there were no complications.    Recent vital signs: Patient Vitals for the past 24 hrs:  BP Temp Temp src Pulse Resp SpO2  03/27/13 0513 151/82 mmHg 98.1 F (36.7 C) - 83 20 100 %  03/26/13 1600 138/81 mmHg 98.5 F (36.9 C) Oral 93 16 99 %  03/26/13 1400 135/67 mmHg 97.6 F (36.4 C) - 99 20 100 %  03/26/13 1250 - - - - - 99 %     Recent laboratory studies:  Recent Labs  03/26/13 0500 03/27/13 0530  WBC 14.7* 17.5*  HGB 9.2* 9.0*  HCT 28.0* 27.4*  PLT 258 269  NA 138 140  K 4.2 4.1  CL 103 105  CO2 21 22  BUN 23 21  CREATININE 1.27* 1.05  GLUCOSE 123* 143*  CALCIUM 8.7 8.5     Discharge Medications:     Medication List    STOP taking these medications       aspirin 81 MG tablet  Replaced by:  aspirin 325 MG EC tablet     traMADol 50 MG tablet  Commonly known as:  ULTRAM      TAKE these medications       albuterol 108 (90 BASE) MCG/ACT inhaler  Commonly known as:  PROVENTIL HFA;VENTOLIN HFA  Inhale 2 puffs into the lungs every 6 (six) hours as needed for wheezing.  amLODipine 5 MG tablet  Commonly known as:  NORVASC  Take 5 mg by mouth daily with breakfast.     aspirin 325 MG EC tablet  Take 1 tablet (325 mg total) by mouth 2 (two) times daily after a meal.     carvedilol 25 MG tablet  Commonly known as:  COREG  Take 25 mg by mouth 2 (two) times daily with a meal.     cyclobenzaprine 10 MG tablet  Commonly known as:  FLEXERIL  Take 10 mg by mouth 3 (three) times daily as needed for muscle spasms.     diclofenac sodium 1 % Gel  Commonly known as:  VOLTAREN  Apply 2 g topically 4 (four) times daily.     docusate sodium 100 MG capsule  Commonly known as:  COLACE  Take 500 mg by mouth daily.     fish oil-omega-3 fatty acids 1000 MG capsule  Take 1 g by mouth 2 (two) times daily.     Fluticasone-Salmeterol 250-50 MCG/DOSE Aepb  Commonly known as:  ADVAIR  Inhale 1 puff into the lungs every 12 (twelve) hours.     furosemide 40 MG tablet   Commonly known as:  LASIX  Take 40 mg by mouth daily.     methocarbamol 500 MG tablet  Commonly known as:  ROBAXIN  Take 1 tablet (500 mg total) by mouth 3 (three) times daily. Prn spasm.     multivitamin with minerals Tabs tablet  Take 1 tablet by mouth daily.     oxyCODONE-acetaminophen 5-325 MG per tablet  Commonly known as:  PERCOCET/ROXICET  Take 1-2 tablets by mouth every 6 (six) hours as needed.     potassium chloride SA 20 MEQ tablet  Commonly known as:  K-DUR,KLOR-CON  Take 20 mEq by mouth daily.     pravastatin 40 MG tablet  Commonly known as:  PRAVACHOL  Take 40 mg by mouth daily after supper.     St Johns Wort 300 MG Caps  Take 300 mg by mouth 3 (three) times daily.     TOVIAZ 8 MG Tb24 tablet  Generic drug:  fesoterodine  Take 8 mg by mouth daily with lunch.        Diagnostic Studies: Dg Chest 2 View  03/23/2013   CLINICAL DATA:  Preop for knee replacement. Hypertension.  EXAM: CHEST  2 VIEW  COMPARISON:  02/01/2013  FINDINGS: The heart size and mediastinal contours are within normal limits.  Eventration of the right anterior hemidiaphragm is stable. The lungs are clear. No pleural effusion or pneumothorax.  The bony thorax is demineralized but intact.  IMPRESSION: No active cardiopulmonary disease.   Electronically Signed   By: Amie Portland M.D.   On: 03/23/2013 16:38    Disposition: Skilled nursing facility      Discharge Orders   Future Orders Complete By Expires   Call MD / Call 911  As directed    Comments:     If you experience chest pain or shortness of breath, CALL 911 and be transported to the hospital emergency room.  If you develope a fever above 101 F, pus (white drainage) or increased drainage or redness at the wound, or calf pain, call your surgeon's office.   CPM  As directed    Comments:     Continuous passive motion machine (CPM):      Use the CPM from 0 degrees to 70 degrees for 6-8 hours per day.      You may  increase by 10 degrees  per day.  You may break it up into 2 or 3 sessions per day.      Use CPM for 1-2 weeks or until you are told to stop.   Diet general  As directed    Do not put a pillow under the knee. Place it under the heel.  As directed    Increase activity slowly as tolerated  As directed    Weight bearing as tolerated  As directed    Questions:     Laterality:     Extremity:        Follow-up Information   Follow up with GRAVES,JOHN L, MD. Schedule an appointment as soon as possible for a visit in 2 weeks.   Specialty:  Orthopedic Surgery   Contact information:   [address] [place] [address] [phone]        Signed: Matthew Folks 03/27/2013, 8:21 AM

## 2013-03-27 NOTE — Progress Notes (Signed)
Physical Therapy Treatment Patient Details Name: Heidi Young MRN: [id number] DOB: 1946-05-03 Today's Date: 03/27/2013 Time: 4098-1191 PT Time Calculation (min): 25 min  PT Assessment / Plan / Recommendation  History of Present Illness Pt is a 67 y/o female admitted s/p L TKA on 03/25/2013.   PT Comments   Pt progressing with PT goals & mobility at this time however she now feels she would best benefit from ST-SNF at d/c to maximize functional recovery & increase independence to ensure safe d/c home.  D/c plans updated.     Follow Up Recommendations  SNF     Does the patient have the potential to tolerate intense rehabilitation     Barriers to Discharge        Equipment Recommendations       Recommendations for Other Services    Frequency 7X/week   Progress towards PT Goals Progress towards PT goals: Progressing toward goals  Plan Discharge plan needs to be updated    Precautions / Restrictions Precautions Precautions: Knee;Fall Precaution Comments: Knee immobilizer d/c as pt demonstrated x10 SLR Required Braces or Orthoses: Knee Immobilizer - Left Knee Immobilizer - Left: On when out of bed or walking Restrictions LLE Weight Bearing: Weight bearing as tolerated   Pertinent Vitals/Pain 5/10  Lt knee.      Mobility  Bed Mobility Bed Mobility: Not assessed Transfers Transfers: Sit to Stand;Stand to Sit Sit to Stand: 4: Min guard;With upper extremity assist;With armrests;From chair/3-in-1 Stand to Sit: 5: Supervision;With upper extremity assist;With armrests;To chair/3-in-1 Ambulation/Gait Ambulation/Gait Assistance: 4: Min guard Ambulation Distance (Feet): 150 Feet Assistive device: Rolling walker Ambulation/Gait Assistance Details: Cues for increased heel strike, terminal knee extension during stance, & encouragement to decrease reliance of UE's on RW.     Gait Pattern: Step-to pattern;Step-through pattern;Decreased weight shift to left General Gait Details:  Trialed ambulation without KI.  No knee buckling noted.  Stairs: No Wheelchair Mobility Wheelchair Mobility: No    Exercises Total Joint Exercises Ankle Circles/Pumps: AROM;Both;10 reps Quad Sets: AROM;Strengthening;Both;5 reps Heel Slides: AROM;Left;Strengthening;10 reps Straight Leg Raises: AROM;Strengthening;Left;10 reps Long Arc Quad: AROM;Strengthening;Left;10 reps Knee Flexion: AAROM;Left;5 reps Goniometric ROM: Lt knee flexion ~90 degrees in sitting     PT Goals (current goals can now be found in the care plan section) Acute Rehab PT Goals PT Goal Formulation: With patient Time For Goal Achievement: 04/02/13 Potential to Achieve Goals: Good  Visit Information  Last PT Received On: 03/27/13 Assistance Needed: +1 History of Present Illness: Pt is a 67 y/o female admitted s/p L TKA on 03/25/2013.    Subjective Data      Cognition  Cognition Arousal/Alertness: Awake/alert Behavior During Therapy: WFL for tasks assessed/performed Overall Cognitive Status: Within Functional Limits for tasks assessed    Balance     End of Session PT - End of Session Equipment Utilized During Treatment: Gait belt Activity Tolerance: Patient tolerated treatment well Patient left: in chair;with call bell/phone within reach Nurse Communication: Mobility status   GP     Lara Mulch 03/27/2013, 1:32 PM  Verdell Face, PTA [phone] 03/27/2013

## 2013-03-30 ENCOUNTER — Other Ambulatory Visit: Payer: Self-pay | Admitting: *Deleted

## 2013-03-30 MED ORDER — OXYCODONE HCL 5 MG PO CAPS: 5.0000 mg | ORAL_CAPSULE | ORAL | Status: DC | PRN

## 2013-03-30 NOTE — Progress Notes (Signed)
Agree with change of d/c disposition to SNF on 03/27/13, documented 03/30/13.  Ruthann Cancer, PT, DPT Acute Rehabilitation Services [phone]

## 2013-03-30 NOTE — Telephone Encounter (Signed)
rx printed, signed, and faxed.

## 2013-04-03 ENCOUNTER — Non-Acute Institutional Stay (SKILLED_NURSING_FACILITY): Payer: Medicare Other | Admitting: Internal Medicine

## 2013-04-03 DIAGNOSIS — M1710 Unilateral primary osteoarthritis, unspecified knee: Secondary | ICD-10-CM

## 2013-04-03 DIAGNOSIS — I428 Other cardiomyopathies: Secondary | ICD-10-CM

## 2013-04-03 DIAGNOSIS — I10 Essential (primary) hypertension: Secondary | ICD-10-CM

## 2013-04-03 DIAGNOSIS — E785 Hyperlipidemia, unspecified: Secondary | ICD-10-CM

## 2013-04-03 DIAGNOSIS — M1712 Unilateral primary osteoarthritis, left knee: Secondary | ICD-10-CM

## 2013-04-03 NOTE — Progress Notes (Signed)
Patient ID: Heidi Young, female   DOB: 10/30/1945, 67 y.o.   MRN: [id number]  [facility]  Chief Complaint  Patient presents with  . Hospitalization Follow-up    new admit    Allergies  Allergen Reactions  . Avelox [Moxifloxacin Hydrochloride]     RASH  . Bee Venom    Code status- full code  HPI 67 y/o female patient with end stage OA underwent elective left knee arthroplasty and is here for STR. Goal is for her to return home. She has been constipated. She is passing gas but has not had a bowel movement in past 4 days. Denies any nausea or vomiting. No abdominal pain.she is passing flatus. Pain is under control  Review of Systems  Constitutional: Negative for fever, chills and diaphoresis.  HENT: Negative for sore throat.   Eyes: Negative for blurred vision and double vision.  Respiratory: Negative for cough, hemoptysis, shortness of breath and wheezing.   Cardiovascular: Negative for chest pain, palpitations, claudication, leg swelling and PND.  Gastrointestinal: Positive for constipation. Negative for heartburn, nausea, vomiting and abdominal pain.  Genitourinary: Negative for dysuria and frequency.  Musculoskeletal: Negative for falls and myalgias.  Skin: Negative for itching and rash.  Neurological: Negative for dizziness, seizures, loss of consciousness, weakness and headaches.  Psychiatric/Behavioral: Negative for depression and memory loss. The patient does not have insomnia.    Past Medical History  Diagnosis Date  . Chest pain   . Cardiomyopathy   . HTN (hypertension)   . Takotsubo cardiomyopathy   . HLD (hyperlipidemia)   . Asthma   . Fibromyalgia   . DJD (degenerative joint disease)   . Chronic anemia   . Obesity   . Carotid bruit     left  . Psychosis   . Lumbar herniated disc   . UTI (lower urinary tract infection)   . Schizophrenia   . Cancer     renal cell carcinoma  . Heart murmur   . Single kidney   . History of kidney cancer     unknown  type. had R kidney removed b/c of CA.   Marland Kitchen Kidney stones   . GERD (gastroesophageal reflux disease)     on rare occas. - uses peptobismol   Past Surgical History  Procedure Laterality Date  . Nephrectomy  1990's    right, secondary to cyst  . Breast lumpectomy  90's    left, cyst  . Cystectomy  90's    back (left)  . Total knee arthroplasty  2005    right  . Cesarean section  1980    x1  . Cardiac catheterization  2010    no intervention  . Total knee arthroplasty Left 03/25/2013    Procedure: TOTAL KNEE ARTHROPLASTY;  Surgeon: Harvie Junior, MD;  Location: [facility] OR;  Service: Orthopedics;  Laterality: Left;   Family History  Problem Relation Age of Onset  . Coronary artery disease Mother 54  . Heart failure Mother     congestive  . Stroke Mother   . Brain cancer Father 43  . Hypertension      siblings  . Aortic stenosis      sibling   History   Social History  . Marital Status: Divorced    Spouse Name: N/A    Number of Children: N/A  . Years of Education: N/A   Occupational History  . disability    Social History Main Topics  . Smoking status: Never Smoker   .  Smokeless tobacco: Not on file  . Alcohol Use: No     Comment: rare  . Drug Use: 1.00 per week    Special: Marijuana     Comment: due to her chronic pain- last use 03/22/2013  . Sexual Activity: Not on file   Other Topics Concern  . Not on file   Social History Narrative  . No narrative on file   Reviewed medication. See [facility]  Physical Exam  Nursing note and vitals reviewed. Constitutional: She is oriented to person, place, and time. She appears well-developed and well-nourished. No distress.  HENT:  Head: Normocephalic and atraumatic.  Eyes: EOM are normal. Pupils are equal, round, and reactive to light.  Neck: Normal range of motion. Neck supple. No thyromegaly present.  Cardiovascular: Normal rate, regular rhythm and normal heart sounds.   Pulmonary/Chest: Effort normal and breath sounds  normal. No respiratory distress. She has no wheezes. She exhibits no tenderness.  Abdominal: Soft. Bowel sounds are normal. She exhibits no distension and no mass. There is no tenderness. There is no guarding.  Musculoskeletal: She exhibits edema.  Left knee ROM limited with pain, dressing in place at site of incision, clean and dry. Trace edema in left leg. Normal warmth  Lymphadenopathy:    She has no cervical adenopathy.  Neurological: She is alert and oriented to person, place, and time. No cranial nerve deficit.  Skin: Skin is warm and dry. No rash noted. She is not diaphoretic. No erythema.  Psychiatric: She has a normal mood and affect. Her behavior is normal. Thought content normal.    Labs- CBC    Component Value Date/Time   WBC 17.5* 03/27/2013 0530   RBC 3.32* 03/27/2013 0530   RBC 3.75* 03/05/2009 1641   HGB 9.0* 03/27/2013 0530   HCT 27.4* 03/27/2013 0530   PLT 269 03/27/2013 0530   MCV 82.5 03/27/2013 0530   MCH 27.1 03/27/2013 0530   MCHC 32.8 03/27/2013 0530   RDW 14.9 03/27/2013 0530   LYMPHSABS 1.9 03/23/2013 1602   MONOABS 0.4 03/23/2013 1602   EOSABS 0.3 03/23/2013 1602   BASOSABS 0.0 03/23/2013 1602    CMP     Component Value Date/Time   NA 140 03/27/2013 0530   K 4.1 03/27/2013 0530   CL 105 03/27/2013 0530   CO2 22 03/27/2013 0530   GLUCOSE 143* 03/27/2013 0530   BUN 21 03/27/2013 0530   CREATININE 1.05 03/27/2013 0530   CALCIUM 8.5 03/27/2013 0530   PROT 7.6 03/23/2013 1602   ALBUMIN 4.3 03/23/2013 1602   AST 42* 03/23/2013 1602   ALT 36* 03/23/2013 1602   ALKPHOS 94 03/23/2013 1602   BILITOT 0.4 03/23/2013 1602   GFRNONAA 54* 03/27/2013 0530   GFRAA 62* 03/27/2013 0530    Assessment/plan  Left knee OA- s/p left TKA. Pain under control. Continue current prn flexeril for muscle spasm and percocet for pain. To follow with PT and OT for gait trianing and exercises. Fall precautions and skin care. Has follow up with orthopedic. Continue asa 325 mg daily  for dvt prophylaxis for now  Hypertension- bp is stable. Continue amlodipine 5 mg daily, toviaz 8 mg daily, full strength aspirin, coreg 25 mg bid. Monitor bp  Hyperlipidemia- continue pravastatin for now. Monitor clinically  Cardiomyopathy- continue b blocker, furosemide , statin and aspirin for now  consitpation- will have her on miralax daily and continue her colace bid. Will give her a dose of lactulose x 1 of 30 ml now and  miralax. If no bowel movement by evening, will provide dulcolax suppository. Periodic bowel exam. Benign exam at present

## 2013-04-06 ENCOUNTER — Other Ambulatory Visit: Payer: Self-pay | Admitting: *Deleted

## 2013-04-06 MED ORDER — OXYCODONE HCL 5 MG PO CAPS: 5.0000 mg | ORAL_CAPSULE | ORAL | Status: DC | PRN

## 2013-04-07 ENCOUNTER — Encounter (HOSPITAL_COMMUNITY): Payer: Self-pay | Admitting: Emergency Medicine

## 2013-04-07 ENCOUNTER — Emergency Department (INDEPENDENT_AMBULATORY_CARE_PROVIDER_SITE_OTHER)
Admission: EM | Admit: 2013-04-07 | Discharge: 2013-04-07 | Disposition: A | Discharge status: Home / Self Care | Payer: Medicare Other | Source: Home / Self Care | Attending: Family Medicine | Admitting: Family Medicine

## 2013-04-07 DIAGNOSIS — M25569 Pain in unspecified knee: Secondary | ICD-10-CM

## 2013-04-07 DIAGNOSIS — M25562 Pain in left knee: Secondary | ICD-10-CM

## 2013-04-07 DIAGNOSIS — G8929 Other chronic pain: Secondary | ICD-10-CM

## 2013-04-07 MED ORDER — HYDROMORPHONE HCL PF 1 MG/ML IJ SOLN: 2.0000 mg | Freq: Once | INTRAMUSCULAR | Status: DC

## 2013-04-07 MED ORDER — HYDROMORPHONE HCL PF 1 MG/ML IJ SOLN
INTRAMUSCULAR | Status: AC
  Filled 2013-04-07: qty 2

## 2013-04-07 MED ORDER — ONDANSETRON 4 MG PO TBDP
4.0000 mg | ORAL_TABLET | Freq: Once | ORAL | Status: AC
  Administered 2013-04-07: 4 mg via ORAL

## 2013-04-07 MED ORDER — ONDANSETRON 4 MG PO TBDP
ORAL_TABLET | ORAL | Status: AC
  Filled 2013-04-07: qty 1

## 2013-04-07 MED ORDER — HYDROMORPHONE HCL PF 1 MG/ML IJ SOLN
2.0000 mg | Freq: Once | INTRAMUSCULAR | Status: AC
  Administered 2013-04-07: 2 mg via INTRAMUSCULAR

## 2013-04-07 MED ORDER — HYDROCODONE-ACETAMINOPHEN 5-325 MG PO TABS: 2.0000 | ORAL_TABLET | Freq: Four times a day (QID) | ORAL | Status: DC | PRN

## 2013-04-07 NOTE — ED Notes (Signed)
C/o right knee pain   was seen here earlier today

## 2013-04-07 NOTE — ED Notes (Signed)
Pt  Reports   r  Knee   Pain      For  2  Days  Pt  Has  Had  surgey    sev  Weeks  Ago  Has  Not  Seen the orthopedist  For  followup       Suture  Line  Intact  Steri  Strips  In place          Pt  Has not seen orthopedist   Yet  For  followup

## 2013-04-07 NOTE — ED Provider Notes (Signed)
CSN: [id number]     Arrival date & time 04/07/13  1149 History   First MD Initiated Contact with Patient 04/07/13 1206     Chief Complaint  Patient presents with  . Knee Pain   (Consider location/radiation/quality/duration/timing/severity/associated sxs/prior Treatment) Patient is a 67 y.o. female presenting with knee pain. The history is provided by the patient. No language interpreter was used.  Knee Pain Location:  Knee Injury: no   Knee location:  L knee Pain details:    Quality:  Aching and sharp   Severity:  Severe   Progression:  Worsening Chronicity:  New Pt had knee surgery (total knee) on 10/10.   Pt is scheduled to see Dr. Luiz Blare at 3p today.   Pt reports she is having severe pain.    Past Medical History  Diagnosis Date  . Chest pain   . Cardiomyopathy   . HTN (hypertension)   . Takotsubo cardiomyopathy   . HLD (hyperlipidemia)   . Asthma   . Fibromyalgia   . DJD (degenerative joint disease)   . Chronic anemia   . Obesity   . Carotid bruit     left  . Psychosis   . Lumbar herniated disc   . UTI (lower urinary tract infection)   . Schizophrenia   . Cancer     renal cell carcinoma  . Heart murmur   . Single kidney   . History of kidney cancer     unknown type. had R kidney removed b/c of CA.   Marland Kitchen Kidney stones   . GERD (gastroesophageal reflux disease)     on rare occas. - uses peptobismol   Past Surgical History  Procedure Laterality Date  . Nephrectomy  1990's    right, secondary to cyst  . Breast lumpectomy  90's    left, cyst  . Cystectomy  90's    back (left)  . Total knee arthroplasty  2005    right  . Cesarean section  1980    x1  . Cardiac catheterization  2010    no intervention  . Total knee arthroplasty Left 03/25/2013    Procedure: TOTAL KNEE ARTHROPLASTY;  Surgeon: Harvie Junior, MD;  Location: [facility] OR;  Service: Orthopedics;  Laterality: Left;   Family History  Problem Relation Age of Onset  . Coronary artery disease Mother 91   . Heart failure Mother     congestive  . Stroke Mother   . Brain cancer Father 79  . Hypertension      siblings  . Aortic stenosis      sibling   History  Substance Use Topics  . Smoking status: Never Smoker   . Smokeless tobacco: Not on file  . Alcohol Use: No     Comment: rare   OB History   Grav Para Term Preterm Abortions TAB SAB Ect Mult Living                 Review of Systems  Musculoskeletal: Positive for joint swelling and myalgias.  Skin: Positive for wound.  All other systems reviewed and are negative.    Allergies  Avelox and Bee venom  Home Medications   Current Outpatient Rx  Name  Route  Sig  Dispense  Refill  . albuterol (PROVENTIL HFA;VENTOLIN HFA) 108 (90 BASE) MCG/ACT inhaler   Inhalation   Inhale 2 puffs into the lungs every 6 (six) hours as needed for wheezing.         Marland Kitchen amLODipine (NORVASC)  5 MG tablet   Oral   Take 5 mg by mouth daily with breakfast.          . aspirin EC 325 MG EC tablet   Oral   Take 1 tablet (325 mg total) by mouth 2 (two) times daily after a meal.   60 tablet   0   . carvedilol (COREG) 25 MG tablet   Oral   Take 25 mg by mouth 2 (two) times daily with a meal.         . cyclobenzaprine (FLEXERIL) 10 MG tablet   Oral   Take 10 mg by mouth 3 (three) times daily as needed for muscle spasms.         . diclofenac sodium (VOLTAREN) 1 % GEL   Topical   Apply 2 g topically 4 (four) times daily.         Marland Kitchen docusate sodium (COLACE) 100 MG capsule   Oral   Take 500 mg by mouth daily.         . fesoterodine (TOVIAZ) 8 MG TB24 tablet   Oral   Take 8 mg by mouth daily with lunch.          . fish oil-omega-3 fatty acids 1000 MG capsule   Oral   Take 1 g by mouth 2 (two) times daily.         . Fluticasone-Salmeterol (ADVAIR) 250-50 MCG/DOSE AEPB   Inhalation   Inhale 1 puff into the lungs every 12 (twelve) hours.         . furosemide (LASIX) 40 MG tablet   Oral   Take 40 mg by mouth daily.          . methocarbamol (ROBAXIN) 500 MG tablet   Oral   Take 1 tablet (500 mg total) by mouth 3 (three) times daily. Prn spasm.   40 tablet   0   . Multiple Vitamin (MULTIVITAMIN WITH MINERALS) TABS tablet   Oral   Take 1 tablet by mouth daily.         Marland Kitchen oxycodone (OXY-IR) 5 MG capsule   Oral   Take 1 capsule (5 mg total) by mouth every 4 (four) hours as needed. Take 1 tablet = 5 mg by mouth every 4 hours for moderate pain: take 2 tablets = 10 mg by mouth every 4 hours as need for severe pain.   180 capsule   0   . oxyCODONE-acetaminophen (PERCOCET/ROXICET) 5-325 MG per tablet   Oral   Take 1-2 tablets by mouth every 6 (six) hours as needed.   40 tablet   0   . potassium chloride SA (K-DUR,KLOR-CON) 20 MEQ tablet   Oral   Take 20 mEq by mouth daily.         . pravastatin (PRAVACHOL) 40 MG tablet   Oral   Take 40 mg by mouth daily after supper.          . St Johns Wort 300 MG CAPS   Oral   Take 300 mg by mouth 3 (three) times daily.           BP 196/102  Pulse 118  Temp(Src) 98.3 F (36.8 C) (Oral)  Resp 20  SpO2 98% Physical Exam  Nursing note and vitals reviewed. Constitutional: She appears well-developed and well-nourished.  HENT:  Head: Normocephalic.  Right Ear: External ear normal.  Left Ear: External ear normal.  Cardiovascular: Normal rate.   Pulmonary/Chest: Effort normal.  Musculoskeletal: She exhibits tenderness.  No sign of infection,  Knee slight swollen  Incision healing  Neurological: She is alert.  Skin: Skin is warm.  Psychiatric: She has a normal mood and affect.    ED Course  Procedures (including critical care time) Labs Review Labs Reviewed - No data to display Imaging Review No results found.  EKG Interpretation     Ventricular Rate:    PR Interval:    QRS Duration:   QT Interval:    QTC Calculation:   R Axis:     Text Interpretation:              MDM   1. Knee pain, left        Elson Areas,  [place] 04/07/13 1257

## 2013-04-07 NOTE — ED Provider Notes (Signed)
CSN: [id number]     Arrival date & time 04/07/13  1929 History   First MD Initiated Contact with Patient 04/07/13 2044     Chief Complaint  Patient presents with  . Joint Swelling   (Consider location/radiation/quality/duration/timing/severity/associated sxs/prior Treatment) Patient is a 67 y.o. female presenting with knee pain. The history is provided by the patient.  Knee Pain Location:  Knee Injury: no (s/p left tkr on 10/10, healing well, having pain issues and conflict with dr Luiz Blare.)   Knee location:  L knee Chronicity:  New Dislocation: no   Prior injury to area:  No   Past Medical History  Diagnosis Date  . Chest pain   . Cardiomyopathy   . HTN (hypertension)   . Takotsubo cardiomyopathy   . HLD (hyperlipidemia)   . Asthma   . Fibromyalgia   . DJD (degenerative joint disease)   . Chronic anemia   . Obesity   . Carotid bruit     left  . Psychosis   . Lumbar herniated disc   . UTI (lower urinary tract infection)   . Schizophrenia   . Cancer     renal cell carcinoma  . Heart murmur   . Single kidney   . History of kidney cancer     unknown type. had R kidney removed b/c of CA.   Marland Kitchen Kidney stones   . GERD (gastroesophageal reflux disease)     on rare occas. - uses peptobismol   Past Surgical History  Procedure Laterality Date  . Nephrectomy  1990's    right, secondary to cyst  . Breast lumpectomy  90's    left, cyst  . Cystectomy  90's    back (left)  . Total knee arthroplasty  2005    right  . Cesarean section  1980    x1  . Cardiac catheterization  2010    no intervention  . Total knee arthroplasty Left 03/25/2013    Procedure: TOTAL KNEE ARTHROPLASTY;  Surgeon: Harvie Junior, MD;  Location: [facility] OR;  Service: Orthopedics;  Laterality: Left;   Family History  Problem Relation Age of Onset  . Coronary artery disease Mother 25  . Heart failure Mother     congestive  . Stroke Mother   . Brain cancer Father 52  . Hypertension      siblings  .  Aortic stenosis      sibling   History  Substance Use Topics  . Smoking status: Never Smoker   . Smokeless tobacco: Not on file  . Alcohol Use: No     Comment: rare   OB History   Grav Para Term Preterm Abortions TAB SAB Ect Mult Living                 Review of Systems  Constitutional: Negative.   Gastrointestinal: Negative.   Musculoskeletal: Positive for joint swelling.  Skin: Negative.     Allergies  Avelox and Bee venom  Home Medications   Current Outpatient Rx  Name  Route  Sig  Dispense  Refill  . albuterol (PROVENTIL HFA;VENTOLIN HFA) 108 (90 BASE) MCG/ACT inhaler   Inhalation   Inhale 2 puffs into the lungs every 6 (six) hours as needed for wheezing.         Marland Kitchen amLODipine (NORVASC) 5 MG tablet   Oral   Take 5 mg by mouth daily with breakfast.          . aspirin EC 325 MG EC tablet  Oral   Take 1 tablet (325 mg total) by mouth 2 (two) times daily after a meal.   60 tablet   0   . carvedilol (COREG) 25 MG tablet   Oral   Take 25 mg by mouth 2 (two) times daily with a meal.         . cyclobenzaprine (FLEXERIL) 10 MG tablet   Oral   Take 10 mg by mouth 3 (three) times daily as needed for muscle spasms.         . diclofenac sodium (VOLTAREN) 1 % GEL   Topical   Apply 2 g topically 4 (four) times daily.         Marland Kitchen docusate sodium (COLACE) 100 MG capsule   Oral   Take 500 mg by mouth daily.         . fesoterodine (TOVIAZ) 8 MG TB24 tablet   Oral   Take 8 mg by mouth daily with lunch.          . fish oil-omega-3 fatty acids 1000 MG capsule   Oral   Take 1 g by mouth 2 (two) times daily.         . Fluticasone-Salmeterol (ADVAIR) 250-50 MCG/DOSE AEPB   Inhalation   Inhale 1 puff into the lungs every 12 (twelve) hours.         . furosemide (LASIX) 40 MG tablet   Oral   Take 40 mg by mouth daily.         Marland Kitchen HYDROcodone-acetaminophen (NORCO/VICODIN) 5-325 MG per tablet   Oral   Take 2 tablets by mouth every 6 (six) hours as  needed for pain.   10 tablet   0   . methocarbamol (ROBAXIN) 500 MG tablet   Oral   Take 1 tablet (500 mg total) by mouth 3 (three) times daily. Prn spasm.   40 tablet   0   . Multiple Vitamin (MULTIVITAMIN WITH MINERALS) TABS tablet   Oral   Take 1 tablet by mouth daily.         Marland Kitchen oxycodone (OXY-IR) 5 MG capsule   Oral   Take 1 capsule (5 mg total) by mouth every 4 (four) hours as needed. Take 1 tablet = 5 mg by mouth every 4 hours for moderate pain: take 2 tablets = 10 mg by mouth every 4 hours as need for severe pain.   180 capsule   0   . oxyCODONE-acetaminophen (PERCOCET/ROXICET) 5-325 MG per tablet   Oral   Take 1-2 tablets by mouth every 6 (six) hours as needed.   40 tablet   0   . potassium chloride SA (K-DUR,KLOR-CON) 20 MEQ tablet   Oral   Take 20 mEq by mouth daily.         . pravastatin (PRAVACHOL) 40 MG tablet   Oral   Take 40 mg by mouth daily after supper.          . St Johns Wort 300 MG CAPS   Oral   Take 300 mg by mouth 3 (three) times daily.           BP 146/73  Pulse 120  Temp(Src) 98 F (36.7 C) (Oral)  Resp 18  SpO2 100% Physical Exam  Nursing note and vitals reviewed. Constitutional: She is oriented to person, place, and time. She appears well-developed and well-nourished.  Musculoskeletal: She exhibits tenderness.       Left knee: She exhibits swelling. She exhibits normal range of motion, no effusion  and no deformity.       Legs: Neurological: She is alert and oriented to person, place, and time.  Skin: Skin is warm and dry.    ED Course  Procedures (including critical care time) Labs Review Labs Reviewed - No data to display Imaging Review No results found.  EKG Interpretation     Ventricular Rate:    PR Interval:    QRS Duration:   QT Interval:    QTC Calculation:   R Axis:     Text Interpretation:              MDM      Linna Hoff, MD 04/07/13 2100

## 2013-04-07 NOTE — ED Notes (Signed)
Pt  Feels  Much  Better  She  Will  Be  Discharged  With  Her  Son  Who  Will  Drive  Her

## 2013-04-09 NOTE — ED Provider Notes (Signed)
Medical screening examination/treatment/procedure(s) were performed by a resident physician or non-physician practitioner and as the supervising physician I was immediately available for consultation/collaboration.  Lovina Zuver, MD    Ayat Drenning S Tansy Lorek, MD 04/09/13 0810 

## 2013-04-11 ENCOUNTER — Encounter (HOSPITAL_COMMUNITY): Payer: Self-pay | Admitting: Emergency Medicine

## 2013-04-11 ENCOUNTER — Emergency Department (HOSPITAL_COMMUNITY): Payer: Medicare Other

## 2013-04-11 ENCOUNTER — Emergency Department (HOSPITAL_COMMUNITY)
Admission: EM | Admit: 2013-04-11 | Discharge: 2013-04-11 | Disposition: A | Discharge status: Home / Self Care | Payer: Medicare Other | Attending: Emergency Medicine | Admitting: Emergency Medicine

## 2013-04-11 DIAGNOSIS — IMO0002 Reserved for concepts with insufficient information to code with codable children: Secondary | ICD-10-CM | POA: Insufficient documentation

## 2013-04-11 DIAGNOSIS — R011 Cardiac murmur, unspecified: Secondary | ICD-10-CM | POA: Insufficient documentation

## 2013-04-11 DIAGNOSIS — Z7982 Long term (current) use of aspirin: Secondary | ICD-10-CM | POA: Insufficient documentation

## 2013-04-11 DIAGNOSIS — Z862 Personal history of diseases of the blood and blood-forming organs and certain disorders involving the immune mechanism: Secondary | ICD-10-CM | POA: Insufficient documentation

## 2013-04-11 DIAGNOSIS — I428 Other cardiomyopathies: Secondary | ICD-10-CM | POA: Insufficient documentation

## 2013-04-11 DIAGNOSIS — E669 Obesity, unspecified: Secondary | ICD-10-CM | POA: Insufficient documentation

## 2013-04-11 DIAGNOSIS — E785 Hyperlipidemia, unspecified: Secondary | ICD-10-CM | POA: Insufficient documentation

## 2013-04-11 DIAGNOSIS — I10 Essential (primary) hypertension: Secondary | ICD-10-CM | POA: Insufficient documentation

## 2013-04-11 DIAGNOSIS — Z85528 Personal history of other malignant neoplasm of kidney: Secondary | ICD-10-CM | POA: Insufficient documentation

## 2013-04-11 DIAGNOSIS — Z79899 Other long term (current) drug therapy: Secondary | ICD-10-CM | POA: Insufficient documentation

## 2013-04-11 DIAGNOSIS — Z5189 Encounter for other specified aftercare: Secondary | ICD-10-CM | POA: Insufficient documentation

## 2013-04-11 DIAGNOSIS — Z96659 Presence of unspecified artificial knee joint: Secondary | ICD-10-CM | POA: Insufficient documentation

## 2013-04-11 DIAGNOSIS — M25569 Pain in unspecified knee: Secondary | ICD-10-CM | POA: Insufficient documentation

## 2013-04-11 DIAGNOSIS — J45909 Unspecified asthma, uncomplicated: Secondary | ICD-10-CM | POA: Insufficient documentation

## 2013-04-11 DIAGNOSIS — Z8659 Personal history of other mental and behavioral disorders: Secondary | ICD-10-CM | POA: Insufficient documentation

## 2013-04-11 DIAGNOSIS — Z87440 Personal history of urinary (tract) infections: Secondary | ICD-10-CM | POA: Insufficient documentation

## 2013-04-11 DIAGNOSIS — Z8719 Personal history of other diseases of the digestive system: Secondary | ICD-10-CM | POA: Insufficient documentation

## 2013-04-11 DIAGNOSIS — F411 Generalized anxiety disorder: Secondary | ICD-10-CM | POA: Insufficient documentation

## 2013-04-11 DIAGNOSIS — Z87442 Personal history of urinary calculi: Secondary | ICD-10-CM | POA: Insufficient documentation

## 2013-04-11 DIAGNOSIS — R059 Cough, unspecified: Secondary | ICD-10-CM | POA: Insufficient documentation

## 2013-04-11 DIAGNOSIS — R05 Cough: Secondary | ICD-10-CM | POA: Insufficient documentation

## 2013-04-11 DIAGNOSIS — M25562 Pain in left knee: Secondary | ICD-10-CM

## 2013-04-11 DIAGNOSIS — M1990 Unspecified osteoarthritis, unspecified site: Secondary | ICD-10-CM | POA: Insufficient documentation

## 2013-04-11 LAB — CBC WITH DIFFERENTIAL/PLATELET
Basophils Absolute: 0.1 10*3/uL (ref 0.0–0.1)
Eosinophils Absolute: 0.5 10*3/uL (ref 0.0–0.7)
Eosinophils Relative: 8 % — ABNORMAL HIGH (ref 0–5)
HCT: 26.7 % — ABNORMAL LOW (ref 36.0–46.0)
Lymphocytes Relative: 32 % (ref 12–46)
Lymphs Abs: 2.3 10*3/uL (ref 0.7–4.0)
Monocytes Absolute: 0.6 10*3/uL (ref 0.1–1.0)
Neutrophils Relative %: 52 % (ref 43–77)
Platelets: 386 10*3/uL (ref 150–400)
RBC: 3.14 MIL/uL — ABNORMAL LOW (ref 3.87–5.11)
RDW: 15 % (ref 11.5–15.5)
WBC: 7.2 10*3/uL (ref 4.0–10.5)

## 2013-04-11 LAB — COMPREHENSIVE METABOLIC PANEL
ALT: 21 U/L (ref 0–35)
AST: 24 U/L (ref 0–37)
Alkaline Phosphatase: 84 U/L (ref 39–117)
CO2: 24 mEq/L (ref 19–32)
GFR calc Af Amer: 32 mL/min — ABNORMAL LOW (ref >90)
GFR calc non Af Amer: 27 mL/min — ABNORMAL LOW (ref >90)
Glucose, Bld: 121 mg/dL — ABNORMAL HIGH (ref 70–99)
Potassium: 3.9 mEq/L (ref 3.5–5.1)
Sodium: 137 mEq/L (ref 135–145)
Total Bilirubin: 0.6 mg/dL (ref 0.3–1.2)
Total Protein: 7.7 g/dL (ref 6.0–8.3)

## 2013-04-11 NOTE — ED Notes (Signed)
MD at bedside. 

## 2013-04-11 NOTE — ED Provider Notes (Signed)
CSN: [id number]     Arrival date & time 04/11/13  4166 History   First MD Initiated Contact with Patient 04/11/13 0757     Chief Complaint  Patient presents with  . Extremity Pain   HPI  Patient presents with concern of left knee pain, new cough.  Left knee pain has been improving since surgical replacement 17 days ago.  She notes that initially she had more swelling, or erythema, or warmth, and less range of motion of the knee.  Each of these characteristics has improved, though she continues to have mild diffuse pain in the knee, worse with motion. She is now no distal dysesthesia or weakness.  She has no objective fever, though she has subjective fever. Patient complains of no cough, onset yesterday.  Following episodes of significant coughing, she has posttussive emesis. No chest pain, no lightheadedness, no syncope, no diarrhea, no abdominal pain. Patient notes that following her surgery she was in a rehabilitation facility.  She left AGAINST MEDICAL ADVICE due to perceived lack of appropriate care.  Following that episode, the patient saw her orthopedist.  She subsequently got in a disagreement with her orthopedist, and now with this concern over the lack of available caregiver for her knee pain.  Past Medical History  Diagnosis Date  . Chest pain   . Cardiomyopathy   . HTN (hypertension)   . Takotsubo cardiomyopathy   . HLD (hyperlipidemia)   . Asthma   . Fibromyalgia   . DJD (degenerative joint disease)   . Chronic anemia   . Obesity   . Carotid bruit     left  . Psychosis   . Lumbar herniated disc   . UTI (lower urinary tract infection)   . Schizophrenia   . Cancer     renal cell carcinoma  . Heart murmur   . Single kidney   . History of kidney cancer     unknown type. had R kidney removed b/c of CA.   Marland Kitchen Kidney stones   . GERD (gastroesophageal reflux disease)     on rare occas. - uses peptobismol   Past Surgical History  Procedure Laterality Date  .  Nephrectomy  1990's    right, secondary to cyst  . Breast lumpectomy  90's    left, cyst  . Cystectomy  90's    back (left)  . Total knee arthroplasty  2005    right  . Cesarean section  1980    x1  . Cardiac catheterization  2010    no intervention  . Total knee arthroplasty Left 03/25/2013    Procedure: TOTAL KNEE ARTHROPLASTY;  Surgeon: Harvie Junior, MD;  Location: [facility] OR;  Service: Orthopedics;  Laterality: Left;   Family History  Problem Relation Age of Onset  . Coronary artery disease Mother 75  . Heart failure Mother     congestive  . Stroke Mother   . Brain cancer Father 49  . Hypertension      siblings  . Aortic stenosis      sibling   History  Substance Use Topics  . Smoking status: Never Smoker   . Smokeless tobacco: Not on file  . Alcohol Use: No     Comment: rare   OB History   Grav Para Term Preterm Abortions TAB SAB Ect Mult Living                 Review of Systems  Constitutional:       Per HPI,  otherwise negative  HENT:       Per HPI, otherwise negative  Respiratory:       Per HPI, otherwise negative  Cardiovascular:       Per HPI, otherwise negative  Gastrointestinal: Negative for vomiting.  Endocrine:       Negative aside from HPI  Genitourinary:       Neg aside from HPI   Musculoskeletal:       Per HPI, otherwise negative  Skin: Negative.   Neurological: Negative for syncope.    Allergies  Avelox and Bee venom  Home Medications   Current Outpatient Rx  Name  Route  Sig  Dispense  Refill  . albuterol (PROVENTIL HFA;VENTOLIN HFA) 108 (90 BASE) MCG/ACT inhaler   Inhalation   Inhale 2 puffs into the lungs every 6 (six) hours as needed for wheezing.         Marland Kitchen amLODipine (NORVASC) 5 MG tablet   Oral   Take 5 mg by mouth daily with breakfast.          . aspirin EC 325 MG EC tablet   Oral   Take 1 tablet (325 mg total) by mouth 2 (two) times daily after a meal.   60 tablet   0   . carvedilol (COREG) 25 MG tablet   Oral    Take 25 mg by mouth 2 (two) times daily with a meal.         . cyclobenzaprine (FLEXERIL) 10 MG tablet   Oral   Take 10 mg by mouth 3 (three) times daily as needed for muscle spasms.         . diclofenac sodium (VOLTAREN) 1 % GEL   Topical   Apply 2 g topically 4 (four) times daily.         Marland Kitchen docusate sodium (COLACE) 100 MG capsule   Oral   Take 500 mg by mouth daily.         . fesoterodine (TOVIAZ) 8 MG TB24 tablet   Oral   Take 8 mg by mouth daily with lunch.          . fish oil-omega-3 fatty acids 1000 MG capsule   Oral   Take 1 g by mouth 2 (two) times daily.         . Fluticasone-Salmeterol (ADVAIR) 250-50 MCG/DOSE AEPB   Inhalation   Inhale 1 puff into the lungs every 12 (twelve) hours.         . furosemide (LASIX) 40 MG tablet   Oral   Take 40 mg by mouth daily.         Marland Kitchen HYDROcodone-acetaminophen (NORCO/VICODIN) 5-325 MG per tablet   Oral   Take 2 tablets by mouth every 6 (six) hours as needed for pain.   10 tablet   0   . methocarbamol (ROBAXIN) 500 MG tablet   Oral   Take 1 tablet (500 mg total) by mouth 3 (three) times daily. Prn spasm.   40 tablet   0   . Multiple Vitamin (MULTIVITAMIN WITH MINERALS) TABS tablet   Oral   Take 1 tablet by mouth daily.         Marland Kitchen oxycodone (OXY-IR) 5 MG capsule   Oral   Take 1 capsule (5 mg total) by mouth every 4 (four) hours as needed. Take 1 tablet = 5 mg by mouth every 4 hours for moderate pain: take 2 tablets = 10 mg by mouth every 4 hours as need for severe pain.  180 capsule   0   . oxyCODONE-acetaminophen (PERCOCET/ROXICET) 5-325 MG per tablet   Oral   Take 1-2 tablets by mouth every 6 (six) hours as needed.   40 tablet   0   . potassium chloride SA (K-DUR,KLOR-CON) 20 MEQ tablet   Oral   Take 20 mEq by mouth daily.         . pravastatin (PRAVACHOL) 40 MG tablet   Oral   Take 40 mg by mouth daily after supper.          . St Johns Wort 300 MG CAPS   Oral   Take 300 mg by mouth  3 (three) times daily.           BP 176/82  Pulse 112  Temp(Src) 98.9 F (37.2 C) (Oral)  SpO2 100% Physical Exam  Nursing note and vitals reviewed. Constitutional: She is oriented to person, place, and time. She appears well-developed and well-nourished. No distress.  HENT:  Head: Normocephalic and atraumatic.  Eyes: Conjunctivae and EOM are normal.  Cardiovascular: Normal rate, regular rhythm and intact distal pulses.   Pulmonary/Chest: Effort normal and breath sounds normal. No stridor. No respiratory distress. She has no wheezes. She has no rales.  Abdominal: She exhibits no distension.  Musculoskeletal: She exhibits no edema.       Left hip: Normal.       Left ankle: Normal.       Legs: Neurological: She is alert and oriented to person, place, and time. No cranial nerve deficit.  Skin: Skin is warm and dry.  Psychiatric: Her mood appears anxious.  Patient has poor insight into her current condition    ED Course  Procedures (including critical care time) Labs Review Labs Reviewed  CBC WITH DIFFERENTIAL  COMPREHENSIVE METABOLIC PANEL   Imaging Review No results found.  EKG Interpretation   None      Prior to, and following the initial evaluation I reviewed the patient's chart, including surgical notes, urgent care followup visits.  10:41 AM I discussed patient's case with our nursing services representative.  We have arranged for the patient will receive home health assessment in 2 days. On repeat exam the patient has no complaints, is sitting upright, reading a newspaper, currently in no distress. MDM  No diagnosis found. Patient presents with knee pain, cough.  Notably, the patient does have a recent surgical procedure, but her endorsement, the pain is less, the erythema less, the edema was diminished as well.  On exam patient has some range of motion capacity, no particular tenderness to palpation, and no overt evidence of infection.  Labs are reassuring.  We  established home health assessment for the patient, and she was appropriate for discharge with outpatient followup.    Gerhard Munch, MD 04/11/13 1043

## 2013-04-11 NOTE — ED Notes (Signed)
Case Manager at bedside to speak with pt about plan of care.

## 2013-04-11 NOTE — ED Notes (Signed)
"  My left leg is swollen and I need help with my wound care." Knee Replacement October 8th.  "I signed out of [facility] October 19th because I wasn't getting the proper care."

## 2013-04-11 NOTE — Progress Notes (Signed)
Case Manager consult for discharge planning.Role of ED Case Manager explained to patient.Patient verbalizes her understanding.Patient reports her ED visit today is secondary to increased Left leg swelling.Patient had a left total knee arthoplasty on 03/25/13. Patient states she wants to return home with home health services.Patient states during her inpatient today she received  Information on  Home health services and elects to use [facility]. [facility] resource booklet provided to patient.This Clinical research associate will liaise with  MD re Home health orders.

## 2013-04-11 NOTE — ED Notes (Signed)
Pt offered a wheelchair upon discharge but refuses stating "I can walk." Pt tolerated ambulation to the waiting room well.  Pt waiting for son to pick her up.

## 2013-04-11 NOTE — Progress Notes (Signed)
Home health orders faxed at 11.25 am .Contacted [facility] and spoke with Darlina Rumpf and provided report that new patient orders were being sent.Verification  Of received fax made by Shann Medal at [facility]. Patient aware it can take 24-48 hours for Home health services to start.Patient reports she did not have any DME needs as  Her   Ortho Drs provided her with prescriptions for a Rolling walker and a Bedside commode.Patient reports she has been managing her bathroom needs well with the use of a   Raised toilet seat.Patients son is following up with her DME needs.No further Case Manager needs.Patient reports she understands her plan of care and had no new questions.

## 2013-04-29 ENCOUNTER — Other Ambulatory Visit (HOSPITAL_COMMUNITY): Payer: Self-pay | Admitting: Internal Medicine

## 2013-04-29 ENCOUNTER — Ambulatory Visit (HOSPITAL_COMMUNITY)
Admission: RE | Admit: 2013-04-29 | Discharge: 2013-04-29 | Disposition: A | Discharge status: Home / Self Care | Payer: Medicare Other | Source: Ambulatory Visit | Attending: Internal Medicine | Admitting: Internal Medicine

## 2013-04-29 DIAGNOSIS — M7989 Other specified soft tissue disorders: Secondary | ICD-10-CM

## 2013-04-29 NOTE — Progress Notes (Signed)
*  PRELIMINARY RESULTS* Vascular Ultrasound Lower extremity venous duplex has been completed.  Preliminary findings: no evidence of DVT    Farrel Demark, RDMS, RVT  04/29/2013, 4:01 PM

## 2013-05-01 ENCOUNTER — Emergency Department (HOSPITAL_COMMUNITY): Payer: Medicare Other

## 2013-05-01 ENCOUNTER — Encounter (HOSPITAL_COMMUNITY): Payer: Self-pay | Admitting: Emergency Medicine

## 2013-05-01 ENCOUNTER — Emergency Department (HOSPITAL_COMMUNITY)
Admission: EM | Admit: 2013-05-01 | Discharge: 2013-05-01 | Disposition: A | Discharge status: Home / Self Care | Payer: Medicare Other | Attending: Emergency Medicine | Admitting: Emergency Medicine

## 2013-05-01 DIAGNOSIS — I10 Essential (primary) hypertension: Secondary | ICD-10-CM | POA: Insufficient documentation

## 2013-05-01 DIAGNOSIS — J45901 Unspecified asthma with (acute) exacerbation: Secondary | ICD-10-CM | POA: Insufficient documentation

## 2013-05-01 DIAGNOSIS — E669 Obesity, unspecified: Secondary | ICD-10-CM | POA: Insufficient documentation

## 2013-05-01 DIAGNOSIS — R112 Nausea with vomiting, unspecified: Secondary | ICD-10-CM | POA: Insufficient documentation

## 2013-05-01 DIAGNOSIS — Z7982 Long term (current) use of aspirin: Secondary | ICD-10-CM | POA: Insufficient documentation

## 2013-05-01 DIAGNOSIS — Z905 Acquired absence of kidney: Secondary | ICD-10-CM | POA: Insufficient documentation

## 2013-05-01 DIAGNOSIS — Z791 Long term (current) use of non-steroidal anti-inflammatories (NSAID): Secondary | ICD-10-CM | POA: Insufficient documentation

## 2013-05-01 DIAGNOSIS — Z9889 Other specified postprocedural states: Secondary | ICD-10-CM | POA: Insufficient documentation

## 2013-05-01 DIAGNOSIS — K219 Gastro-esophageal reflux disease without esophagitis: Secondary | ICD-10-CM | POA: Insufficient documentation

## 2013-05-01 DIAGNOSIS — M1990 Unspecified osteoarthritis, unspecified site: Secondary | ICD-10-CM | POA: Insufficient documentation

## 2013-05-01 DIAGNOSIS — R5381 Other malaise: Secondary | ICD-10-CM | POA: Insufficient documentation

## 2013-05-01 DIAGNOSIS — R011 Cardiac murmur, unspecified: Secondary | ICD-10-CM | POA: Insufficient documentation

## 2013-05-01 DIAGNOSIS — Z79899 Other long term (current) drug therapy: Secondary | ICD-10-CM | POA: Insufficient documentation

## 2013-05-01 DIAGNOSIS — Z85528 Personal history of other malignant neoplasm of kidney: Secondary | ICD-10-CM | POA: Insufficient documentation

## 2013-05-01 DIAGNOSIS — R197 Diarrhea, unspecified: Secondary | ICD-10-CM | POA: Insufficient documentation

## 2013-05-01 DIAGNOSIS — Z87440 Personal history of urinary (tract) infections: Secondary | ICD-10-CM | POA: Insufficient documentation

## 2013-05-01 DIAGNOSIS — R109 Unspecified abdominal pain: Secondary | ICD-10-CM | POA: Insufficient documentation

## 2013-05-01 DIAGNOSIS — R531 Weakness: Secondary | ICD-10-CM

## 2013-05-01 DIAGNOSIS — E785 Hyperlipidemia, unspecified: Secondary | ICD-10-CM | POA: Insufficient documentation

## 2013-05-01 DIAGNOSIS — Z862 Personal history of diseases of the blood and blood-forming organs and certain disorders involving the immune mechanism: Secondary | ICD-10-CM | POA: Insufficient documentation

## 2013-05-01 DIAGNOSIS — Z8659 Personal history of other mental and behavioral disorders: Secondary | ICD-10-CM | POA: Insufficient documentation

## 2013-05-01 DIAGNOSIS — M7989 Other specified soft tissue disorders: Secondary | ICD-10-CM | POA: Insufficient documentation

## 2013-05-01 DIAGNOSIS — IMO0002 Reserved for concepts with insufficient information to code with codable children: Secondary | ICD-10-CM | POA: Insufficient documentation

## 2013-05-01 DIAGNOSIS — Z87442 Personal history of urinary calculi: Secondary | ICD-10-CM | POA: Insufficient documentation

## 2013-05-01 LAB — CBC WITH DIFFERENTIAL/PLATELET
Basophils Relative: 1 % (ref 0–1)
Eosinophils Absolute: 0.2 10*3/uL (ref 0.0–0.7)
Eosinophils Relative: 4 % (ref 0–5)
Hemoglobin: 8.9 g/dL — ABNORMAL LOW (ref 12.0–15.0)
Lymphs Abs: 2 10*3/uL (ref 0.7–4.0)
MCH: 26.4 pg (ref 26.0–34.0)
MCHC: 31.3 g/dL (ref 30.0–36.0)
MCV: 84.3 fL (ref 78.0–100.0)
Monocytes Absolute: 0.5 10*3/uL (ref 0.1–1.0)
Monocytes Relative: 8 % (ref 3–12)
Neutrophils Relative %: 52 % (ref 43–77)
RBC: 3.37 MIL/uL — ABNORMAL LOW (ref 3.87–5.11)
RDW: 15.3 % (ref 11.5–15.5)

## 2013-05-01 LAB — URINALYSIS, ROUTINE W REFLEX MICROSCOPIC
Bilirubin Urine: NEGATIVE
Ketones, ur: 15 mg/dL — AB
Leukocytes, UA: NEGATIVE
Nitrite: NEGATIVE
Protein, ur: NEGATIVE mg/dL
Urobilinogen, UA: 0.2 mg/dL (ref 0.0–1.0)
pH: 5.5 (ref 5.0–8.0)

## 2013-05-01 LAB — COMPREHENSIVE METABOLIC PANEL
AST: 24 U/L (ref 0–37)
Albumin: 4.1 g/dL (ref 3.5–5.2)
Alkaline Phosphatase: 81 U/L (ref 39–117)
BUN: 12 mg/dL (ref 6–23)
Calcium: 10 mg/dL (ref 8.4–10.5)
Creatinine, Ser: 1.11 mg/dL — ABNORMAL HIGH (ref 0.50–1.10)
GFR calc Af Amer: 58 mL/min — ABNORMAL LOW (ref >90)
GFR calc non Af Amer: 50 mL/min — ABNORMAL LOW (ref >90)
Glucose, Bld: 93 mg/dL (ref 70–99)
Potassium: 3.2 mEq/L — ABNORMAL LOW (ref 3.5–5.1)
Sodium: 144 mEq/L (ref 135–145)
Total Bilirubin: 0.6 mg/dL (ref 0.3–1.2)
Total Protein: 7.5 g/dL (ref 6.0–8.3)

## 2013-05-01 LAB — PRO B NATRIURETIC PEPTIDE: Pro B Natriuretic peptide (BNP): 231.1 pg/mL — ABNORMAL HIGH (ref 0–125)

## 2013-05-01 LAB — LIPASE, BLOOD: Lipase: 28 U/L (ref 11–59)

## 2013-05-01 LAB — POCT I-STAT TROPONIN I

## 2013-05-01 MED ORDER — ONDANSETRON HCL 4 MG/2ML IJ SOLN
4.0000 mg | Freq: Once | INTRAMUSCULAR | Status: AC
  Administered 2013-05-01: 4 mg via INTRAVENOUS
  Filled 2013-05-01: qty 2

## 2013-05-01 MED ORDER — OXYCODONE-ACETAMINOPHEN 5-325 MG PO TABS: 1.0000 | ORAL_TABLET | Freq: Four times a day (QID) | ORAL | Status: DC | PRN

## 2013-05-01 MED ORDER — POTASSIUM CHLORIDE CRYS ER 20 MEQ PO TBCR
40.0000 meq | EXTENDED_RELEASE_TABLET | Freq: Once | ORAL | Status: AC
  Administered 2013-05-01: 40 meq via ORAL
  Filled 2013-05-01: qty 2

## 2013-05-01 MED ORDER — MORPHINE SULFATE 4 MG/ML IJ SOLN
4.0000 mg | Freq: Once | INTRAMUSCULAR | Status: AC
  Administered 2013-05-01: 4 mg via INTRAVENOUS
  Filled 2013-05-01: qty 1

## 2013-05-01 MED ORDER — IOHEXOL 350 MG/ML SOLN
100.0000 mL | Freq: Once | INTRAVENOUS | Status: AC | PRN
  Administered 2013-05-01: 100 mL via INTRAVENOUS

## 2013-05-01 MED ORDER — ONDANSETRON HCL 4 MG PO TABS: 4.0000 mg | ORAL_TABLET | Freq: Three times a day (TID) | ORAL | Status: DC | PRN

## 2013-05-01 MED ORDER — SODIUM CHLORIDE 0.9 % IV BOLUS (SEPSIS)
500.0000 mL | Freq: Once | INTRAVENOUS | Status: AC
  Administered 2013-05-01: 500 mL via INTRAVENOUS

## 2013-05-01 MED ORDER — ALBUTEROL SULFATE (5 MG/ML) 0.5% IN NEBU
5.0000 mg | INHALATION_SOLUTION | Freq: Once | RESPIRATORY_TRACT | Status: AC
  Administered 2013-05-01: 5 mg via RESPIRATORY_TRACT
  Filled 2013-05-01: qty 1

## 2013-05-01 NOTE — Progress Notes (Signed)
Came to see patient per her request. Patient was pleasant and welcoming during visit. She requested devotional reading material including a Bible. Brought these to her. Presence; support.

## 2013-05-01 NOTE — ED Notes (Addendum)
Pt has redness and edema to left knee. Incision appears clean with no drainage. She states she has been running fevers since the surgery as high as 101.

## 2013-05-01 NOTE — ED Provider Notes (Signed)
CSN: [id number]     Arrival date & time 05/01/13  1037 History   First MD Initiated Contact with Patient 05/01/13 1103     Chief Complaint  Patient presents with  . Knee Pain   (Consider location/radiation/quality/duration/timing/severity/associated sxs/prior Treatment) HPI Comments: 68 year old female brought in by EMS for weakness. Patient had knee surgery on her left knee about one month ago. She states that she's recently been treated for bronchitis by her PCP with an unknown antibiotic. She's been feeling a little better but is currently short of breath. She's having trouble completing full sentences due to her dyspnea. She ran out of her inhaler feels like she needs a nebulizer currently. After getting her shorts breath better she was able to talk anymore and states that she's been vomiting and having nonbloody diarrhea for the past couple days. She's not think is any poor by mouth intake. She has some cramping abdominal pain that she thinks is from vomiting. Since last night she found herself too weak to get to the bathroom in time and is up urinating on herself. She states that the longer she feeling weaker. She did mention some chest pain last night but states that was from anxiety and she does this all the time. Does not have any current chest pain. Her left knee is swollen and red and when asked about this she states is actually improving and she's had this checked multiple times. She has a diffusely swollen left leg and stay she had a negative DVT study by her PCP within the last 2 days. She states this swelling has been constant since her surgery. It is difficult to obtain the history based on patient being a poor historian and frequently changing the subject.   Past Medical History  Diagnosis Date  . Chest pain   . Cardiomyopathy   . HTN (hypertension)   . Takotsubo cardiomyopathy   . HLD (hyperlipidemia)   . Asthma   . Fibromyalgia   . DJD (degenerative joint disease)   . Chronic  anemia   . Obesity   . Carotid bruit     left  . Psychosis   . Lumbar herniated disc   . UTI (lower urinary tract infection)   . Schizophrenia   . Cancer     renal cell carcinoma  . Heart murmur   . Single kidney   . History of kidney cancer     unknown type. had R kidney removed b/c of CA.   Marland Kitchen Kidney stones   . GERD (gastroesophageal reflux disease)     on rare occas. - uses peptobismol   Past Surgical History  Procedure Laterality Date  . Nephrectomy  1990's    right, secondary to cyst  . Breast lumpectomy  90's    left, cyst  . Cystectomy  90's    back (left)  . Total knee arthroplasty  2005    right  . Cesarean section  1980    x1  . Cardiac catheterization  2010    no intervention  . Total knee arthroplasty Left 03/25/2013    Procedure: TOTAL KNEE ARTHROPLASTY;  Surgeon: Harvie Junior, MD;  Location: [facility] OR;  Service: Orthopedics;  Laterality: Left;   Family History  Problem Relation Age of Onset  . Coronary artery disease Mother 68  . Heart failure Mother     congestive  . Stroke Mother   . Brain cancer Father 42  . Hypertension      siblings  .  Aortic stenosis      sibling   History  Substance Use Topics  . Smoking status: Never Smoker   . Smokeless tobacco: Not on file  . Alcohol Use: No     Comment: rare   OB History   Grav Para Term Preterm Abortions TAB SAB Ect Mult Living                 Review of Systems  Respiratory: Positive for cough and shortness of breath.   Cardiovascular: Positive for leg swelling.  Gastrointestinal: Positive for nausea, vomiting and diarrhea.  Neurological: Positive for weakness.  All other systems reviewed and are negative.    Allergies  Avelox and Bee venom  Home Medications   Current Outpatient Rx  Name  Route  Sig  Dispense  Refill  . albuterol (PROVENTIL HFA;VENTOLIN HFA) 108 (90 BASE) MCG/ACT inhaler   Inhalation   Inhale 2 puffs into the lungs every 6 (six) hours as needed for wheezing.           Marland Kitchen amLODipine (NORVASC) 5 MG tablet   Oral   Take 5 mg by mouth daily with breakfast.          . aspirin EC 325 MG EC tablet   Oral   Take 1 tablet (325 mg total) by mouth 2 (two) times daily after a meal.   60 tablet   0   . budesonide-formoterol (SYMBICORT) 160-4.5 MCG/ACT inhaler   Inhalation   Inhale 1 puff into the lungs 2 (two) times daily.         . carvedilol (COREG) 25 MG tablet   Oral   Take 25 mg by mouth 2 (two) times daily with a meal.         . cyclobenzaprine (FLEXERIL) 10 MG tablet   Oral   Take 10 mg by mouth 3 (three) times daily as needed for muscle spasms.         . diclofenac sodium (VOLTAREN) 1 % GEL   Topical   Apply 2 g topically 4 (four) times daily.         Marland Kitchen docusate sodium (COLACE) 100 MG capsule   Oral   Take 100 mg by mouth daily.          Marland Kitchen EPINEPHrine (EPI-PEN) 0.3 mg/0.3 mL SOAJ injection   Intramuscular   Inject 0.3 mg into the muscle once.         . fesoterodine (TOVIAZ) 8 MG TB24 tablet   Oral   Take 8 mg by mouth daily with lunch.          . fish oil-omega-3 fatty acids 1000 MG capsule   Oral   Take 1 g by mouth 2 (two) times daily.         . furosemide (LASIX) 40 MG tablet   Oral   Take 40 mg by mouth daily.         Marland Kitchen HYDROcodone-acetaminophen (NORCO/VICODIN) 5-325 MG per tablet   Oral   Take 2 tablets by mouth every 6 (six) hours as needed for pain.   10 tablet   0   . ibuprofen (ADVIL,MOTRIN) 200 MG tablet   Oral   Take 400 mg by mouth every 6 (six) hours as needed for pain.         . Multiple Vitamin (MULTIVITAMIN WITH MINERALS) TABS tablet   Oral   Take 1 tablet by mouth daily.         . potassium chloride SA (  K-DUR,KLOR-CON) 20 MEQ tablet   Oral   Take 20 mEq by mouth daily.         . pravastatin (PRAVACHOL) 40 MG tablet   Oral   Take 40 mg by mouth daily after supper.          . St Johns Wort 300 MG CAPS   Oral   Take 300 mg by mouth 3 (three) times daily.          .  traMADol (ULTRAM) 50 MG tablet   Oral   Take 50 mg by mouth every 8 (eight) hours as needed for pain.         Marland Kitchen oxyCODONE-acetaminophen (PERCOCET/ROXICET) 5-325 MG per tablet   Oral   Take 1-2 tablets by mouth every 6 (six) hours as needed.   40 tablet   0    BP 152/116  Pulse 89  Temp(Src) 98.1 F (36.7 C) (Oral)  Resp 16  SpO2 100% Physical Exam  Nursing note and vitals reviewed. Constitutional: She is oriented to person, place, and time. She appears well-developed and well-nourished.  HENT:  Head: Normocephalic and atraumatic.  Right Ear: External ear normal.  Left Ear: External ear normal.  Nose: Nose normal.  Eyes: Right eye exhibits no discharge. Left eye exhibits no discharge.  Cardiovascular: Normal rate, regular rhythm and normal heart sounds.   Pulmonary/Chest: Effort normal and breath sounds normal. She has no decreased breath sounds. She has no wheezes.  Appears tachypneic and out of breath with talking  Abdominal: Soft. There is no tenderness.  Musculoskeletal:       Left knee: She exhibits swelling and erythema. No tenderness found.       Legs: Diffusely swollen left lower extremity  Neurological: She is alert and oriented to person, place, and time.  Skin: Skin is warm and dry.    ED Course  Procedures (including critical care time) Labs Review Labs Reviewed  CBC WITH DIFFERENTIAL - Abnormal; Notable for the following:    RBC 3.37 (*)    Hemoglobin 8.9 (*)    HCT 28.4 (*)    All other components within normal limits  COMPREHENSIVE METABOLIC PANEL - Abnormal; Notable for the following:    Potassium 3.2 (*)    Creatinine, Ser 1.11 (*)    GFR calc non Af Amer 50 (*)    GFR calc Af Amer 58 (*)    All other components within normal limits  PRO B NATRIURETIC PEPTIDE - Abnormal; Notable for the following:    Pro B Natriuretic peptide (BNP) 231.1 (*)    All other components within normal limits  URINALYSIS, ROUTINE W REFLEX MICROSCOPIC - Abnormal;  Notable for the following:    APPearance CLOUDY (*)    Ketones, ur 15 (*)    All other components within normal limits  LIPASE, BLOOD  CG4 I-STAT (LACTIC ACID)  POCT I-STAT TROPONIN I   Imaging Review Dg Chest 2 View  05/01/2013   CLINICAL DATA:  Dyspnea  EXAM: CHEST  2 VIEW  COMPARISON:  04/11/2013  FINDINGS: The right hemidiaphragm is again elevated. The cardiac shadow is stable. The lungs are well aerated. Mild right basilar atelectasis is seen. No bony abnormality is noted.  IMPRESSION: Mild right basilar atelectasis.   Electronically Signed   By: Alcide Clever M.D.   On: 05/01/2013 13:11   Ct Angio Chest Pe W/cm &/or Wo Cm  05/01/2013   CLINICAL DATA:  Chest pain and shortness of breath.  EXAM: CT  ANGIOGRAPHY CHEST WITH CONTRAST  TECHNIQUE: Multidetector CT imaging of the chest was performed using the standard protocol during bolus administration of intravenous contrast. Multiplanar CT image reconstructions including MIPs were obtained to evaluate the vascular anatomy.  CONTRAST:  100 mL OMNIPAQUE IOHEXOL 350 MG/ML SOLN  COMPARISON:  PA and lateral chest earlier this same day and 04/11/2013.  FINDINGS: No pulmonary embolus is identified. Heart size is normal. No axillary, hilar or mediastinal lymphadenopathy. No pleural or pericardial effusion. The lungs demonstrate only mild dependent atelectasis. Visualized upper abdomen shows fatty infiltration of the liver. No focal bony abnormality is identified.  Review of the MIP images confirms the above findings.  IMPRESSION: Negative for pulmonary embolus or acute disease.  Fatty infiltration of the liver.   Electronically Signed   By: Drusilla Kanner M.D.   On: 05/01/2013 15:23    EKG Interpretation     Ventricular Rate:  97 PR Interval:  157 QRS Duration: 77 QT Interval:  355 QTC Calculation: 451 R Axis:   41 Text Interpretation:  Sinus rhythm Ventricular premature complex Borderline T abnormalities, diffuse leads Baseline wander in  lead(s) V2 V6 No acute ischemia No significant change since last tracing           Angiocath insertion Performed by: Pricilla Loveless T  Consent: Verbal consent obtained. Risks and benefits: risks, benefits and alternatives were discussed Time out: Immediately prior to procedure a "time out" was called to verify the correct patient, procedure, equipment, support staff and site/side marked as required.  Preparation: Patient was prepped and draped in the usual sterile fashion.  Vein Location: right brachial  Ultrasound Guided  Gauge: 20  Normal blood return and flush without difficulty Patient tolerance: Patient tolerated the procedure well with no immediate complications.    MDM   1. Nausea vomiting and diarrhea   2. Weakness    Patient with multiple vague complaints. Initially stated she was short of breath with clear lungs and no hyopxia. Resolved with 1 albuterol neb. Vomiting and weakness at home, no vomiting here. Left knee is red and swollen, but with this going on for a month and improving, septic arthritis is unlikely. Felt improved in ED. Due to left leg swelling and dyspnea there was suspicion for PE but has a normal CT. Atyipcal CP at home but patient states this only occurs with anxiety. Occurred over 12 hours ago with normal EKG and neg troponin. Low suspicion for ACS. Will treat with pain meds and anti-emetics at home. I offered more fluids and observation management of her unspecified weakness but patient wants to go home and SW will help arrange home health and PT.    Audree Camel, MD 05/01/13 [phone]

## 2013-05-01 NOTE — ED Notes (Signed)
Pt transported to XRay 

## 2013-05-01 NOTE — ED Notes (Signed)
Pt has agreed to allowed son to visit.

## 2013-05-01 NOTE — ED Notes (Signed)
After going into room to do an initial assessment, pt became aggressive and combative.  Pt begin shouting "please don't ask me any questions, everything is in my chart, no one will help me, all I need is a bed pan." Pt then stated "just get my son".  I asked the pt if her son was in the waiting room she then began to get out the bed pushed me out of the room into the hall.  I then told the pt it was unacceptable to yell and assault staff members that are trying to provide care.   Pt kicked myself and resident out of room. Pt is acting belligerent and refusing to allow [place], RN  to place her on a bedpan that she requested.

## 2013-05-01 NOTE — Progress Notes (Signed)
CSW received consult for snf placement/full time care upon discharge. CSW met with pt at bedside to assess pt needs. Pt states she had knee replacement surgery, and was discharged to [facility] when medically stable. Pt states she was there for 10 days but didn't receive the care she believed she needed so she signed herself out AMA. Pt reports she was upset because she hadn't had a bm in 10 days and wasn't able to eat. Pt states I went home ont he weekend, and on Monday I called to get the help I deserved, and I didn't get a thing like a dog, because I signed out AMA." Pt states she spoke with her PCP who ordered the hospital bed Pt states she has lost 30 lbs not trying to since her surgery. Pt states she went home and stayed with pt son. Pt shared, "This was no convenient, so I went home and was left all alone like a dog".   CSW asked patient, "What can we do to help you out after you're medically stable." Patient states, "I just want to go home and get some help, have a rn do my wound care, and get some therapy." Pt states she does not want to go to a skilled nursing facility for short term rehab. Pt states she is interested in [facility]. Pt states she does not want to privately for someone to help clean, cook, or help pt with shopping.   CSW informed EDP of plan and RN CM. Marland KitchenNo further Clinical Social Work needs, signing off.   Catha Gosselin, LCSW [phone]  ED CSW .05/01/2013 1340pm

## 2013-05-01 NOTE — ED Notes (Signed)
Bed: QI34 Expected date:  Expected time:  Means of arrival:  Comments: EMS-out of pain meds

## 2013-05-01 NOTE — ED Notes (Signed)
Patient transported to CT 

## 2013-05-01 NOTE — ED Notes (Signed)
Pt transported to the main lobby for ATM per pt request. [employer] called to pick pt up at main lobby.

## 2013-05-01 NOTE — ED Notes (Addendum)
Via [facility] pt from home c/o of left knee pain from a total Knee replacement on 03/25/13. She also c/o nausea and vomiting and shortness of breath with exertion and talking.She is out of pain meds. Pt has hx of HTN. She seen her PCP for the edema he sent her for a doppler of leg which came back negative.

## 2013-05-01 NOTE — ED Notes (Signed)
Case management at bedside.

## 2013-05-01 NOTE — Progress Notes (Signed)
   CARE MANAGEMENT ED NOTE 05/01/2013  Patient:  Heidi Young, Heidi Young   Account Number:  [id number]  Date Initiated:  05/01/2013  Documentation initiated by:  Edd Arbour  Subjective/Objective Assessment:   67 yr old [facility] aarp medicare complete pt with post op left knee surgery pain Surgery done by ortho-Dr Graves10/8/14 [place] agencies like [facility], [facility], [facility] [place]     Subjective/Objective Assessment Detail:   pcp Fleet Contras Reports her son is her support system but "he is busy with his business" Pt states she wants [facility] for services as her pcp encouraged because he has used [place] services before. Pt repeatedly stated " I have been treated like a dog" when discussing previously home health services  Pt states she does not want [facility] or Frances Furbish if they can not come to see her until 05/05/13 Pt states she will call a person she knows to come stay with her & will call [facility], [facility] and [facility] at home to see who could come visit her "quicker"     Action/Plan:   CM spoke with ED SW, ED RN CM reviewed EPIC notes Cm spoke with pt to discuss home health services & discuss differences in home health & private duty CM agreed to send referrals to [facility], [facility], [facility] Allow pt to ventilate   Action/Plan Detail:   CM able to speak with Eunice Blase of [facility] [place] for verbal referrals Faxed referrals to [facility] and [facility] per pt requests prior to her leaving ED CM gave pt a list of Private duty nurse services  Pt refused [facility] referral   Anticipated DC Date:  05/01/2013     Status Recommendation to Physician:   Result of Recommendation:    Other ED Services  Consult Working Plan   In-house referral  Clinical Social Worker   DC Planning Services  Other   [facility] Choice  HOME HEALTH   Choice offered to / List presented to:  C-1 Patient     HH arranged  HH-1 RN  HH-2 PT  HH-6  SOCIAL WORKER  HH-4 NURSE'S AIDE       Status of service:  Completed, signed off  ED Comments:  [facility] and Frances Furbish confirmed unable to see pt until Tuesday 05/05/13 Pt denies need for DME Pt initially refused Private duty nursing services list Pt observed putting on her clothes with no assist except to get her support sock on  ED Comments Detail:  Pt cut the edges of sock and toe to get it on left foot. Fax confirmation received for clinicals sent to [facility], [facility] and [facility] respectively at [phone], [phone] [phone] & [phone]

## 2013-05-01 NOTE — ED Notes (Signed)
Pt son called and will be here in about 20 min.

## 2013-05-01 NOTE — ED Notes (Signed)
IV malpositioned, MD at bedside to place another.

## 2013-05-03 ENCOUNTER — Emergency Department (HOSPITAL_COMMUNITY)
Admission: EM | Admit: 2013-05-03 | Discharge: 2013-05-03 | Disposition: A | Discharge status: Home / Self Care | Payer: Medicare Other | Attending: Emergency Medicine | Admitting: Emergency Medicine

## 2013-05-03 ENCOUNTER — Encounter (HOSPITAL_COMMUNITY): Payer: Self-pay | Admitting: Emergency Medicine

## 2013-05-03 DIAGNOSIS — Z8719 Personal history of other diseases of the digestive system: Secondary | ICD-10-CM | POA: Insufficient documentation

## 2013-05-03 DIAGNOSIS — Z7982 Long term (current) use of aspirin: Secondary | ICD-10-CM | POA: Insufficient documentation

## 2013-05-03 DIAGNOSIS — Z87442 Personal history of urinary calculi: Secondary | ICD-10-CM | POA: Insufficient documentation

## 2013-05-03 DIAGNOSIS — R011 Cardiac murmur, unspecified: Secondary | ICD-10-CM | POA: Insufficient documentation

## 2013-05-03 DIAGNOSIS — J45901 Unspecified asthma with (acute) exacerbation: Secondary | ICD-10-CM | POA: Insufficient documentation

## 2013-05-03 DIAGNOSIS — M25569 Pain in unspecified knee: Secondary | ICD-10-CM | POA: Insufficient documentation

## 2013-05-03 DIAGNOSIS — E669 Obesity, unspecified: Secondary | ICD-10-CM | POA: Insufficient documentation

## 2013-05-03 DIAGNOSIS — R0600 Dyspnea, unspecified: Secondary | ICD-10-CM

## 2013-05-03 DIAGNOSIS — Z87440 Personal history of urinary (tract) infections: Secondary | ICD-10-CM | POA: Insufficient documentation

## 2013-05-03 DIAGNOSIS — I10 Essential (primary) hypertension: Secondary | ICD-10-CM | POA: Insufficient documentation

## 2013-05-03 DIAGNOSIS — R5381 Other malaise: Secondary | ICD-10-CM | POA: Insufficient documentation

## 2013-05-03 DIAGNOSIS — Z791 Long term (current) use of non-steroidal anti-inflammatories (NSAID): Secondary | ICD-10-CM | POA: Insufficient documentation

## 2013-05-03 DIAGNOSIS — Z85528 Personal history of other malignant neoplasm of kidney: Secondary | ICD-10-CM | POA: Insufficient documentation

## 2013-05-03 DIAGNOSIS — Z8659 Personal history of other mental and behavioral disorders: Secondary | ICD-10-CM | POA: Insufficient documentation

## 2013-05-03 DIAGNOSIS — Z862 Personal history of diseases of the blood and blood-forming organs and certain disorders involving the immune mechanism: Secondary | ICD-10-CM | POA: Insufficient documentation

## 2013-05-03 DIAGNOSIS — Z79899 Other long term (current) drug therapy: Secondary | ICD-10-CM | POA: Insufficient documentation

## 2013-05-03 DIAGNOSIS — G8928 Other chronic postprocedural pain: Secondary | ICD-10-CM | POA: Insufficient documentation

## 2013-05-03 DIAGNOSIS — E785 Hyperlipidemia, unspecified: Secondary | ICD-10-CM | POA: Insufficient documentation

## 2013-05-03 NOTE — ED Notes (Signed)
Attempted to have patient ambulate to obtain pulse ox, patient refusing to walk or have ambulatory pulse ox checked per MD order--patient ambulated during ED visit without difficulty or unsteady gait Patient stated that she wants to leave "since the doctor doesn't want me here." Patient leaving [facility] Charge nurse aware

## 2013-05-03 NOTE — ED Notes (Signed)
Informed by Irving Burton, RN that pt refused vitals.  Pt stated she was ready to go after Dr. Norlene Campbell attempted to explain why she was being discharged.

## 2013-05-03 NOTE — ED Notes (Signed)
Pt c/o shortness of breath.  Seen yesterday for same.  Pt states not getting any better.  Pt states short of breath and hoarse.  Also c/o knee pain which is chronic post surgery.  Pt given nausea meds yesterday and no antibiotics.

## 2013-05-03 NOTE — ED Notes (Signed)
Dr. Norlene Campbell made aware of previously noted issues with patient MD to speak with patient Charge nurse made aware

## 2013-05-03 NOTE — ED Notes (Signed)
Patient ambulated to bathroom and back to room without difficulty.

## 2013-05-03 NOTE — ED Provider Notes (Signed)
CSN: [id number]     Arrival date & time 05/03/13  0050 History   First MD Initiated Contact with Patient 05/03/13 0055     Chief Complaint  Patient presents with  . Shortness of Breath   (Consider location/radiation/quality/duration/timing/severity/associated sxs/prior Treatment) HPI 67 year old female presents to the emergency department from home with complaint of generalized weakness, shortness of breath, chronic knee pain, and inability to care for herself at home.  Patient was seen in the emergency department yesterday with similar complaints.  She had a full workup that included a CT angiogram of her chest.  No specific cause for her dyspnea was found.  She was seen by social work, who tried to arrange home health care.  Patient reports she's been unable to find home health until Tuesday.  She reports she: Her primary care Dr. this evening trying to get home oxygen supplied to her.  Her doctor recommended she go to the emergency department for evaluation for possible admission.  Patient status post a left total knee arthroplasty 03/25/2013.  She signed herself out AMA from the rehabilitation facility.  She has also had a disagreement with her orthopedist.  She reports her pain is controlled.  The swelling in her left leg along with redness is unchanged since the surgery.  She denies any fevers or chills.  She reports that her nausea and vomiting and diarrhea, which she had yesterday is much improved.  She's been able to eat and drink today.  Her shortness of breath is is associated with laying flat, which she's had for some time, and with ambulation or exertion, which is new.  Patient reports she does not feel that she can care for herself at home. Past Medical History  Diagnosis Date  . Chest pain   . Cardiomyopathy   . HTN (hypertension)   . Takotsubo cardiomyopathy   . HLD (hyperlipidemia)   . Asthma   . Fibromyalgia   . DJD (degenerative joint disease)   . Chronic anemia   . Obesity    . Carotid bruit     left  . Psychosis   . Lumbar herniated disc   . UTI (lower urinary tract infection)   . Schizophrenia   . Cancer     renal cell carcinoma  . Heart murmur   . Single kidney   . History of kidney cancer     unknown type. had R kidney removed b/c of CA.   Marland Kitchen Kidney stones   . GERD (gastroesophageal reflux disease)     on rare occas. - uses peptobismol   Past Surgical History  Procedure Laterality Date  . Nephrectomy  1990's    right, secondary to cyst  . Breast lumpectomy  90's    left, cyst  . Cystectomy  90's    back (left)  . Total knee arthroplasty  2005    right  . Cesarean section  1980    x1  . Cardiac catheterization  2010    no intervention  . Total knee arthroplasty Left 03/25/2013    Procedure: TOTAL KNEE ARTHROPLASTY;  Surgeon: Harvie Junior, MD;  Location: [facility] OR;  Service: Orthopedics;  Laterality: Left;   Family History  Problem Relation Age of Onset  . Coronary artery disease Mother 62  . Heart failure Mother     congestive  . Stroke Mother   . Brain cancer Father 55  . Hypertension      siblings  . Aortic stenosis  sibling   History  Substance Use Topics  . Smoking status: Never Smoker   . Smokeless tobacco: Not on file  . Alcohol Use: No     Comment: rare   OB History   Grav Para Term Preterm Abortions TAB SAB Ect Mult Living                 Review of Systems  Allergies  Avelox and Bee venom  Home Medications   Current Outpatient Rx  Name  Route  Sig  Dispense  Refill  . albuterol (PROVENTIL HFA;VENTOLIN HFA) 108 (90 BASE) MCG/ACT inhaler   Inhalation   Inhale 2 puffs into the lungs every 6 (six) hours as needed for wheezing.         Marland Kitchen amLODipine (NORVASC) 5 MG tablet   Oral   Take 5 mg by mouth daily with breakfast.          . aspirin EC 325 MG EC tablet   Oral   Take 1 tablet (325 mg total) by mouth 2 (two) times daily after a meal.   60 tablet   0   . budesonide-formoterol (SYMBICORT) 160-4.5  MCG/ACT inhaler   Inhalation   Inhale 1 puff into the lungs 2 (two) times daily.         . carvedilol (COREG) 25 MG tablet   Oral   Take 25 mg by mouth 2 (two) times daily with a meal.         . cyclobenzaprine (FLEXERIL) 10 MG tablet   Oral   Take 10 mg by mouth 3 (three) times daily as needed for muscle spasms.         . diclofenac sodium (VOLTAREN) 1 % GEL   Topical   Apply 2 g topically 4 (four) times daily.         Marland Kitchen docusate sodium (COLACE) 100 MG capsule   Oral   Take 100 mg by mouth daily.          Marland Kitchen EPINEPHrine (EPI-PEN) 0.3 mg/0.3 mL SOAJ injection   Intramuscular   Inject 0.3 mg into the muscle once.         . fesoterodine (TOVIAZ) 8 MG TB24 tablet   Oral   Take 8 mg by mouth daily with lunch.          . fish oil-omega-3 fatty acids 1000 MG capsule   Oral   Take 1 g by mouth 2 (two) times daily.         . furosemide (LASIX) 40 MG tablet   Oral   Take 40 mg by mouth daily.         Marland Kitchen HYDROcodone-acetaminophen (NORCO/VICODIN) 5-325 MG per tablet   Oral   Take 2 tablets by mouth every 6 (six) hours as needed for pain.   10 tablet   0   . ibuprofen (ADVIL,MOTRIN) 200 MG tablet   Oral   Take 400 mg by mouth every 6 (six) hours as needed for pain.         . Multiple Vitamin (MULTIVITAMIN WITH MINERALS) TABS tablet   Oral   Take 1 tablet by mouth daily.         . ondansetron (ZOFRAN) 4 MG tablet   Oral   Take 1 tablet (4 mg total) by mouth every 8 (eight) hours as needed for nausea or vomiting.   12 tablet   0   . oxyCODONE-acetaminophen (PERCOCET/ROXICET) 5-325 MG per tablet   Oral   Take  1-2 tablets by mouth every 6 (six) hours as needed.   40 tablet   0   . oxyCODONE-acetaminophen (PERCOCET/ROXICET) 5-325 MG per tablet   Oral   Take 1-2 tablets by mouth every 6 (six) hours as needed for severe pain.   20 tablet   0   . potassium chloride SA (K-DUR,KLOR-CON) 20 MEQ tablet   Oral   Take 20 mEq by mouth daily.          . pravastatin (PRAVACHOL) 40 MG tablet   Oral   Take 40 mg by mouth daily after supper.          . St Johns Wort 300 MG CAPS   Oral   Take 300 mg by mouth 3 (three) times daily.          . traMADol (ULTRAM) 50 MG tablet   Oral   Take 50 mg by mouth every 8 (eight) hours as needed for pain.          BP 150/71  Pulse 92  Temp(Src) 98.4 F (36.9 C) (Oral)  Resp 18  Ht 5\' 7"  (1.702 m)  Wt 230 lb (104.327 kg)  BMI 36.01 kg/m2  SpO2 100% Physical Exam  Constitutional: She is oriented to person, place, and time. She appears well-developed and well-nourished. No distress.  HENT:  Head: Normocephalic and atraumatic.  Nose: Nose normal.  Mouth/Throat: Oropharynx is clear and moist.  Neck: Normal range of motion. Neck supple. No JVD present. No tracheal deviation present. No thyromegaly present.  Cardiovascular: Normal rate, regular rhythm, normal heart sounds and intact distal pulses.  Exam reveals no gallop and no friction rub.   No murmur heard. Pulmonary/Chest: Effort normal and breath sounds normal. No stridor. No respiratory distress. She has no wheezes. She has no rales. She exhibits no tenderness.  Abdominal: Soft. Bowel sounds are normal. She exhibits no distension and no mass. There is no tenderness. There is no rebound and no guarding.  Musculoskeletal: Normal range of motion. She exhibits edema (bilateral lower sternum, and the edema, left slightly worse than the right.  There is some mild erythremia to left knee and upper tibia area t).  Lymphadenopathy:    She has no cervical adenopathy.  Neurological: She is alert and oriented to person, place, and time. She exhibits normal muscle tone. Coordination normal.  Patient observed to walk into the department.  Using a cane with minimal difficulty  Skin: Skin is warm and dry. No rash noted. No erythema. No pallor.    ED Course  Procedures (including critical care time) Labs Review Labs Reviewed - No data to  display Imaging Review Dg Chest 2 View  05/01/2013   CLINICAL DATA:  Dyspnea  EXAM: CHEST  2 VIEW  COMPARISON:  04/11/2013  FINDINGS: The right hemidiaphragm is again elevated. The cardiac shadow is stable. The lungs are well aerated. Mild right basilar atelectasis is seen. No bony abnormality is noted.  IMPRESSION: Mild right basilar atelectasis.   Electronically Signed   By: Alcide Clever M.D.   On: 05/01/2013 13:11   Ct Angio Chest Pe W/cm &/or Wo Cm  05/01/2013   CLINICAL DATA:  Chest pain and shortness of breath.  EXAM: CT ANGIOGRAPHY CHEST WITH CONTRAST  TECHNIQUE: Multidetector CT imaging of the chest was performed using the standard protocol during bolus administration of intravenous contrast. Multiplanar CT image reconstructions including MIPs were obtained to evaluate the vascular anatomy.  CONTRAST:  100 mL OMNIPAQUE IOHEXOL 350 MG/ML SOLN  COMPARISON:  PA and lateral chest earlier this same day and 04/11/2013.  FINDINGS: No pulmonary embolus is identified. Heart size is normal. No axillary, hilar or mediastinal lymphadenopathy. No pleural or pericardial effusion. The lungs demonstrate only mild dependent atelectasis. Visualized upper abdomen shows fatty infiltration of the liver. No focal bony abnormality is identified.  Review of the MIP images confirms the above findings.  IMPRESSION: Negative for pulmonary embolus or acute disease.  Fatty infiltration of the liver.   Electronically Signed   By: Drusilla Kanner M.D.   On: 05/01/2013 15:23    EKG Interpretation   None       MDM   1. Dyspnea    67 year old female with generalized weakness, and dyspnea.  I was called away during evaluation of this patient, discuss with nursing staff to have her up and about and check oxygen saturations with ambulation.  Patient reported to tech that she was unable to walk.  A short time later, patient was able to walk to the bathroom.  Patient then requested to leave AMA as she did not feel that we are  caring for her appropriately.  When asked why she refused to walk to check her O2 saturation, she reports that could'nt walk then.  Patient then asked how she did get to the bathroom.  Patient reports "I didn't want to pee the bed".  I explained to the patient that based on her CT scan and lab work done yesterday.  She did not at this time meet criteria for admission.  Patient became very frustrated with me, and reported that her doctor felt she needed to be admitted.  She left prior to me contacting her primary care Dr. to find out more information.    Olivia Mackie, MD 05/03/13 [phone]

## 2013-05-03 NOTE — ED Notes (Signed)
While attempting to DC patient, patient stated that she doesn't understand why she isn't going to be admitted Patient stated that she spoke to her PCP last night who told her that she needed to be admitted Patient offered several times to have Dr. Norlene Campbell come in again and speak with her Patient initially refused, but now states that she wants to speak with Dr. Norlene Campbell because she does not understand why she is being discharged. Will make MD aware

## 2013-06-04 ENCOUNTER — Other Ambulatory Visit: Payer: Self-pay | Admitting: Internal Medicine

## 2013-06-04 DIAGNOSIS — M5126 Other intervertebral disc displacement, lumbar region: Secondary | ICD-10-CM

## 2013-06-07 ENCOUNTER — Emergency Department (HOSPITAL_COMMUNITY): Payer: Medicare Other

## 2013-06-07 ENCOUNTER — Encounter (HOSPITAL_COMMUNITY): Payer: Self-pay | Admitting: Emergency Medicine

## 2013-06-07 ENCOUNTER — Emergency Department (HOSPITAL_COMMUNITY)
Admission: EM | Admit: 2013-06-07 | Discharge: 2013-06-07 | Disposition: A | Discharge status: Home / Self Care | Payer: Medicare Other | Attending: Emergency Medicine | Admitting: Emergency Medicine

## 2013-06-07 DIAGNOSIS — Z96659 Presence of unspecified artificial knee joint: Secondary | ICD-10-CM | POA: Insufficient documentation

## 2013-06-07 DIAGNOSIS — E785 Hyperlipidemia, unspecified: Secondary | ICD-10-CM | POA: Insufficient documentation

## 2013-06-07 DIAGNOSIS — E669 Obesity, unspecified: Secondary | ICD-10-CM | POA: Insufficient documentation

## 2013-06-07 DIAGNOSIS — Z905 Acquired absence of kidney: Secondary | ICD-10-CM | POA: Insufficient documentation

## 2013-06-07 DIAGNOSIS — R609 Edema, unspecified: Secondary | ICD-10-CM

## 2013-06-07 DIAGNOSIS — IMO0001 Reserved for inherently not codable concepts without codable children: Secondary | ICD-10-CM | POA: Insufficient documentation

## 2013-06-07 DIAGNOSIS — Z7982 Long term (current) use of aspirin: Secondary | ICD-10-CM | POA: Insufficient documentation

## 2013-06-07 DIAGNOSIS — M79609 Pain in unspecified limb: Secondary | ICD-10-CM

## 2013-06-07 DIAGNOSIS — R600 Localized edema: Secondary | ICD-10-CM

## 2013-06-07 DIAGNOSIS — Z85528 Personal history of other malignant neoplasm of kidney: Secondary | ICD-10-CM | POA: Insufficient documentation

## 2013-06-07 DIAGNOSIS — Z87442 Personal history of urinary calculi: Secondary | ICD-10-CM | POA: Insufficient documentation

## 2013-06-07 DIAGNOSIS — R011 Cardiac murmur, unspecified: Secondary | ICD-10-CM | POA: Insufficient documentation

## 2013-06-07 DIAGNOSIS — Z862 Personal history of diseases of the blood and blood-forming organs and certain disorders involving the immune mechanism: Secondary | ICD-10-CM | POA: Insufficient documentation

## 2013-06-07 DIAGNOSIS — M1990 Unspecified osteoarthritis, unspecified site: Secondary | ICD-10-CM | POA: Insufficient documentation

## 2013-06-07 DIAGNOSIS — M7989 Other specified soft tissue disorders: Secondary | ICD-10-CM | POA: Insufficient documentation

## 2013-06-07 DIAGNOSIS — Z87440 Personal history of urinary (tract) infections: Secondary | ICD-10-CM | POA: Insufficient documentation

## 2013-06-07 DIAGNOSIS — J45901 Unspecified asthma with (acute) exacerbation: Secondary | ICD-10-CM | POA: Insufficient documentation

## 2013-06-07 DIAGNOSIS — Z79899 Other long term (current) drug therapy: Secondary | ICD-10-CM | POA: Insufficient documentation

## 2013-06-07 DIAGNOSIS — I10 Essential (primary) hypertension: Secondary | ICD-10-CM | POA: Insufficient documentation

## 2013-06-07 DIAGNOSIS — Z791 Long term (current) use of non-steroidal anti-inflammatories (NSAID): Secondary | ICD-10-CM | POA: Insufficient documentation

## 2013-06-07 DIAGNOSIS — F209 Schizophrenia, unspecified: Secondary | ICD-10-CM | POA: Insufficient documentation

## 2013-06-07 DIAGNOSIS — Z8719 Personal history of other diseases of the digestive system: Secondary | ICD-10-CM | POA: Insufficient documentation

## 2013-06-07 DIAGNOSIS — E876 Hypokalemia: Secondary | ICD-10-CM

## 2013-06-07 LAB — CBC WITH DIFFERENTIAL/PLATELET
Basophils Relative: 1 % (ref 0–1)
Eosinophils Relative: 5 % (ref 0–5)
HCT: 31.2 % — ABNORMAL LOW (ref 36.0–46.0)
Hemoglobin: 9.9 g/dL — ABNORMAL LOW (ref 12.0–15.0)
MCH: 26.3 pg (ref 26.0–34.0)
MCHC: 31.7 g/dL (ref 30.0–36.0)
MCV: 83 fL (ref 78.0–100.0)
Monocytes Absolute: 0.7 10*3/uL (ref 0.1–1.0)
Monocytes Relative: 11 % (ref 3–12)
Neutro Abs: 3.4 10*3/uL (ref 1.7–7.7)
RBC: 3.76 MIL/uL — ABNORMAL LOW (ref 3.87–5.11)

## 2013-06-07 LAB — POCT I-STAT, CHEM 8
Calcium, Ion: 1.22 mmol/L (ref 1.13–1.30)
Chloride: 112 mEq/L (ref 96–112)
Creatinine, Ser: 1.4 mg/dL — ABNORMAL HIGH (ref 0.50–1.10)
Glucose, Bld: 105 mg/dL — ABNORMAL HIGH (ref 70–99)
HCT: 33 % — ABNORMAL LOW (ref 36.0–46.0)
Hemoglobin: 11.2 g/dL — ABNORMAL LOW (ref 12.0–15.0)
Potassium: 3 mEq/L — ABNORMAL LOW (ref 3.5–5.1)

## 2013-06-07 NOTE — Progress Notes (Signed)
VASCULAR LAB PRELIMINARY  PRELIMINARY  PRELIMINARY  PRELIMINARY  Bilateral lower extremity venous Dopplers completed.    Preliminary report:  There is no DVT or SVT noted in the bilateral lower extremities.  Emmalene Kattner, RVT 06/07/2013, 8:45 AM

## 2013-06-07 NOTE — ED Notes (Signed)
Pt states a couple of days ago she notice pain and slight swelling in left leg. Pt has hx left knee replacement on Oct. 8 2014. Pt states she has some complication with knee replacement after about 2 weeks. Pt states she is SOB due to hx asthma, pt use O2 at home.

## 2013-06-07 NOTE — ED Notes (Signed)
Md at bedside

## 2013-06-07 NOTE — ED Provider Notes (Addendum)
CSN: [id number]     Arrival date & time 06/07/13  4098 History   First MD Initiated Contact with Patient 06/07/13 0631     Chief Complaint  Patient presents with  . Leg Pain   (Consider location/radiation/quality/duration/timing/severity/associated sxs/prior Treatment) Patient is a 67 y.o. female presenting with leg pain. The history is provided by the patient.  Leg Pain Associated symptoms: no back pain    patient has chronic pain and swelling in her left lower extremity since a knee replacement in October. She is multiple visits for the same. She recently had her Lasix switched from scheduled to when necessary. She went one day without it and then states her lower legs swell up quickly. She also has some mild worsening shortness of breath. She is on chronic oxygen. No chest pain. No fevers. She has some redness to her lower leg. She states this is unusual for the swelling. She's had ultrasounds in the past with no previous DVT.  Past Medical History  Diagnosis Date  . Chest pain   . Cardiomyopathy   . HTN (hypertension)   . Takotsubo cardiomyopathy   . HLD (hyperlipidemia)   . Asthma   . Fibromyalgia   . DJD (degenerative joint disease)   . Chronic anemia   . Obesity   . Carotid bruit     left  . Psychosis   . Lumbar herniated disc   . UTI (lower urinary tract infection)   . Schizophrenia   . Cancer     renal cell carcinoma  . Heart murmur   . Single kidney   . History of kidney cancer     unknown type. had R kidney removed b/c of CA.   Marland Kitchen Kidney stones   . GERD (gastroesophageal reflux disease)     on rare occas. - uses peptobismol   Past Surgical History  Procedure Laterality Date  . Nephrectomy  1990's    right, secondary to cyst  . Breast lumpectomy  90's    left, cyst  . Cystectomy  90's    back (left)  . Total knee arthroplasty  2005    right  . Cesarean section  1980    x1  . Cardiac catheterization  2010    no intervention  . Total knee arthroplasty  Left 03/25/2013    Procedure: TOTAL KNEE ARTHROPLASTY;  Surgeon: Harvie Junior, MD;  Location: [facility];  Service: Orthopedics;  Laterality: Left;   Family History  Problem Relation Age of Onset  . Coronary artery disease Mother 49  . Heart failure Mother     congestive  . Stroke Mother   . Brain cancer Father 72  . Hypertension      siblings  . Aortic stenosis      sibling   History  Substance Use Topics  . Smoking status: Never Smoker   . Smokeless tobacco: Not on file  . Alcohol Use: No     Comment: rare   OB History   Grav Para Term Preterm Abortions TAB SAB Ect Mult Living                 Review of Systems  Constitutional: Negative for activity change and appetite change.  Eyes: Negative for pain.  Respiratory: Positive for cough and shortness of breath. Negative for chest tightness.   Cardiovascular: Positive for leg swelling. Negative for chest pain.  Gastrointestinal: Negative for nausea, vomiting, abdominal pain and diarrhea.  Genitourinary: Negative for flank pain.  Musculoskeletal: Negative  for back pain and neck stiffness.  Skin: Positive for color change. Negative for rash.  Neurological: Negative for weakness, numbness and headaches.  Psychiatric/Behavioral: Negative for behavioral problems.    Allergies  Avelox and Bee venom  Home Medications   Current Outpatient Rx  Name  Route  Sig  Dispense  Refill  . albuterol (PROVENTIL HFA;VENTOLIN HFA) 108 (90 BASE) MCG/ACT inhaler   Inhalation   Inhale 2 puffs into the lungs every 6 (six) hours as needed for wheezing.         Marland Kitchen amLODipine (NORVASC) 5 MG tablet   Oral   Take 5 mg by mouth daily with breakfast.          . aspirin EC 325 MG EC tablet   Oral   Take 1 tablet (325 mg total) by mouth 2 (two) times daily after a meal.   60 tablet   0   . carvedilol (COREG) 25 MG tablet   Oral   Take 25 mg by mouth 2 (two) times daily with a meal.         . cyclobenzaprine (FLEXERIL) 10 MG tablet    Oral   Take 10 mg by mouth 3 (three) times daily as needed for muscle spasms.         . diclofenac sodium (VOLTAREN) 1 % GEL   Topical   Apply 2 g topically 4 (four) times daily.         Marland Kitchen EPINEPHrine (EPI-PEN) 0.3 mg/0.3 mL SOAJ injection   Intramuscular   Inject 0.3 mg into the muscle once.         . fesoterodine (TOVIAZ) 8 MG TB24 tablet   Oral   Take 8 mg by mouth daily with lunch.          . fish oil-omega-3 fatty acids 1000 MG capsule   Oral   Take 1 g by mouth 2 (two) times daily.         . furosemide (LASIX) 40 MG tablet   Oral   Take 40 mg by mouth daily as needed for fluid.          Marland Kitchen ibuprofen (ADVIL,MOTRIN) 200 MG tablet   Oral   Take 400 mg by mouth every 6 (six) hours as needed for pain.         . Misc Natural Products (COLON CLEANSE PO)   Oral   Take 1 capsule by mouth daily.         . mometasone-formoterol (DULERA) 100-5 MCG/ACT AERO   Inhalation   Inhale 2 puffs into the lungs 2 (two) times daily.         . Multiple Vitamin (MULTIVITAMIN WITH MINERALS) TABS tablet   Oral   Take 1 tablet by mouth daily.         . ondansetron (ZOFRAN) 4 MG tablet   Oral   Take 1 tablet (4 mg total) by mouth every 8 (eight) hours as needed for nausea or vomiting.   12 tablet   0   . oxyCODONE-acetaminophen (PERCOCET/ROXICET) 5-325 MG per tablet   Oral   Take 1-2 tablets by mouth every 6 (six) hours as needed for severe pain.   20 tablet   0   . potassium chloride SA (K-DUR,KLOR-CON) 20 MEQ tablet   Oral   Take 20 mEq by mouth daily.         . pravastatin (PRAVACHOL) 40 MG tablet   Oral   Take 40 mg by mouth daily after  supper.          Marland Kitchen QUEtiapine (SEROQUEL) 50 MG tablet   Oral   Take 150 mg by mouth at bedtime.         . traMADol (ULTRAM) 50 MG tablet   Oral   Take 50 mg by mouth every 8 (eight) hours as needed for pain.          BP 147/92  Pulse 112  Temp(Src) 97.9 F (36.6 C) (Oral)  Resp 18  SpO2 100% Physical  Exam  Nursing note and vitals reviewed. Constitutional: She is oriented to person, place, and time. She appears well-developed and well-nourished.  HENT:  Head: Normocephalic and atraumatic.  Patient is wearing nasal cannula oxygen  Eyes: EOM are normal. Pupils are equal, round, and reactive to light.  Neck: Normal range of motion. Neck supple.  Cardiovascular: Regular rhythm and normal heart sounds.   No murmur heard. Mild tachycardia  Pulmonary/Chest: No respiratory distress. She has no wheezes. She has no rales.  Mild diffuse wheezes. Rhonchi in right mid to upper lung field. No respiratory distress  Abdominal: Soft. Bowel sounds are normal. She exhibits no distension. There is no tenderness. There is no rebound and no guarding.  Musculoskeletal: Normal range of motion. She exhibits edema and tenderness.  Postsurgical scar to left knee. Moderate pitting edema to left lower leg. Less pitting edema to right lower leg. There is mild erythema without induration on the left leg.  Neurological: She is alert and oriented to person, place, and time. No cranial nerve deficit.  Skin: Skin is warm and dry.  Psychiatric: She has a normal mood and affect. Her speech is normal.    ED Course  Procedures (including critical care time) Labs Review Labs Reviewed  CBC WITH DIFFERENTIAL - Abnormal; Notable for the following:    RBC 3.76 (*)    Hemoglobin 9.9 (*)    HCT 31.2 (*)    All other components within normal limits  PRO B NATRIURETIC PEPTIDE - Abnormal; Notable for the following:    Pro B Natriuretic peptide (BNP) 138.5 (*)    All other components within normal limits  POCT I-STAT, CHEM 8 - Abnormal; Notable for the following:    Potassium 3.0 (*)    Creatinine, Ser 1.40 (*)    Glucose, Bld 105 (*)    Hemoglobin 11.2 (*)    HCT 33.0 (*)    All other components within normal limits  POCT I-STAT TROPONIN I   Imaging Review Dg Chest 2 View  06/07/2013   CLINICAL DATA:  Lower extremity  swelling, pain, shortness of Breath.  EXAM: CHEST  2 VIEW  COMPARISON:  05/01/2013  FINDINGS: Elevation of the right hemidiaphragm, stable. No confluent airspace opacities. Heart is normal size. No effusions or acute bony abnormality.  IMPRESSION: No active disease.   Electronically Signed   By: Charlett Nose M.D.   On: 06/07/2013 07:46    EKG Interpretation    Date/Time:  Sunday June 07 2013 07:56:14 EST Ventricular Rate:  106 PR Interval:  179 QRS Duration: 82 QT Interval:  349 QTC Calculation: 463 R Axis:   24 Text Interpretation:  Sinus tachycardia Borderline T abnormalities, diffuse leads Confirmed by Mckinzy Fuller  MD, Olliver Boyadjian ([id number]) on 06/07/2013 10:17:24 AM            MDM   1. Peripheral edema   2. Hypokalemia    Acute on chronic swelling and pain in her left lower sternal. Has had similar  symptoms recent knee surgery. Has been worked up for DVT and PE in the past. Since who swelling Doppler was ordered and was negative. Creatinine is mildly elevated. Potassium is mildly low. X-ray is reassuring. Patient recent change in her Lasix. Will discharge to follow with her PCP    Juliet Rude. Rubin Payor, MD 06/07/13 1018  Juliet Rude. Rubin Payor, MD 06/07/13 1018

## 2013-06-13 ENCOUNTER — Other Ambulatory Visit: Payer: Medicare Other

## 2013-08-27 ENCOUNTER — Encounter (HOSPITAL_COMMUNITY): Payer: Self-pay | Admitting: Emergency Medicine

## 2013-08-27 ENCOUNTER — Emergency Department (HOSPITAL_COMMUNITY)
Admission: EM | Admit: 2013-08-27 | Discharge: 2013-08-28 | Disposition: A | Discharge status: Other Acute Inpatient Hospital | Payer: Medicare Other | Attending: Emergency Medicine | Admitting: Emergency Medicine

## 2013-08-27 ENCOUNTER — Emergency Department (HOSPITAL_COMMUNITY): Payer: Medicare Other

## 2013-08-27 DIAGNOSIS — R0602 Shortness of breath: Secondary | ICD-10-CM

## 2013-08-27 DIAGNOSIS — L03119 Cellulitis of unspecified part of limb: Principal | ICD-10-CM

## 2013-08-27 DIAGNOSIS — E785 Hyperlipidemia, unspecified: Secondary | ICD-10-CM | POA: Insufficient documentation

## 2013-08-27 DIAGNOSIS — I10 Essential (primary) hypertension: Secondary | ICD-10-CM | POA: Insufficient documentation

## 2013-08-27 DIAGNOSIS — K219 Gastro-esophageal reflux disease without esophagitis: Secondary | ICD-10-CM | POA: Insufficient documentation

## 2013-08-27 DIAGNOSIS — Z85528 Personal history of other malignant neoplasm of kidney: Secondary | ICD-10-CM | POA: Insufficient documentation

## 2013-08-27 DIAGNOSIS — E669 Obesity, unspecified: Secondary | ICD-10-CM | POA: Insufficient documentation

## 2013-08-27 DIAGNOSIS — R4189 Other symptoms and signs involving cognitive functions and awareness: Secondary | ICD-10-CM

## 2013-08-27 DIAGNOSIS — R000 Tachycardia, unspecified: Secondary | ICD-10-CM | POA: Insufficient documentation

## 2013-08-27 DIAGNOSIS — Z8719 Personal history of other diseases of the digestive system: Secondary | ICD-10-CM | POA: Insufficient documentation

## 2013-08-27 DIAGNOSIS — L0390 Cellulitis, unspecified: Secondary | ICD-10-CM

## 2013-08-27 DIAGNOSIS — L02419 Cutaneous abscess of limb, unspecified: Secondary | ICD-10-CM | POA: Insufficient documentation

## 2013-08-27 DIAGNOSIS — Z862 Personal history of diseases of the blood and blood-forming organs and certain disorders involving the immune mechanism: Secondary | ICD-10-CM | POA: Insufficient documentation

## 2013-08-27 DIAGNOSIS — F209 Schizophrenia, unspecified: Secondary | ICD-10-CM | POA: Insufficient documentation

## 2013-08-27 DIAGNOSIS — M79609 Pain in unspecified limb: Secondary | ICD-10-CM

## 2013-08-27 DIAGNOSIS — J45901 Unspecified asthma with (acute) exacerbation: Secondary | ICD-10-CM | POA: Insufficient documentation

## 2013-08-27 DIAGNOSIS — R011 Cardiac murmur, unspecified: Secondary | ICD-10-CM | POA: Insufficient documentation

## 2013-08-27 DIAGNOSIS — Z87440 Personal history of urinary (tract) infections: Secondary | ICD-10-CM | POA: Insufficient documentation

## 2013-08-27 DIAGNOSIS — M1990 Unspecified osteoarthritis, unspecified site: Secondary | ICD-10-CM | POA: Insufficient documentation

## 2013-08-27 DIAGNOSIS — F309 Manic episode, unspecified: Secondary | ICD-10-CM | POA: Insufficient documentation

## 2013-08-27 DIAGNOSIS — IMO0001 Reserved for inherently not codable concepts without codable children: Secondary | ICD-10-CM | POA: Insufficient documentation

## 2013-08-27 DIAGNOSIS — M7989 Other specified soft tissue disorders: Secondary | ICD-10-CM | POA: Insufficient documentation

## 2013-08-27 DIAGNOSIS — I5181 Takotsubo syndrome: Secondary | ICD-10-CM | POA: Insufficient documentation

## 2013-08-27 DIAGNOSIS — Z87442 Personal history of urinary calculi: Secondary | ICD-10-CM | POA: Insufficient documentation

## 2013-08-27 DIAGNOSIS — Z79899 Other long term (current) drug therapy: Secondary | ICD-10-CM | POA: Insufficient documentation

## 2013-08-27 LAB — COMPREHENSIVE METABOLIC PANEL
ALK PHOS: 83 U/L (ref 39–117)
ALT: 20 U/L (ref 0–35)
AST: 20 U/L (ref 0–37)
Albumin: 4.2 g/dL (ref 3.5–5.2)
BUN: 20 mg/dL (ref 6–23)
CHLORIDE: 98 meq/L (ref 96–112)
CO2: 20 mEq/L (ref 19–32)
Calcium: 9.7 mg/dL (ref 8.4–10.5)
Creatinine, Ser: 1.24 mg/dL — ABNORMAL HIGH (ref 0.50–1.10)
GFR calc non Af Amer: 44 mL/min — ABNORMAL LOW (ref >90)
GFR, EST AFRICAN AMERICAN: 51 mL/min — AB (ref >90)
GLUCOSE: 95 mg/dL (ref 70–99)
POTASSIUM: 4 meq/L (ref 3.7–5.3)
SODIUM: 138 meq/L (ref 137–147)
TOTAL PROTEIN: 7.5 g/dL (ref 6.0–8.3)
Total Bilirubin: 1.2 mg/dL (ref 0.3–1.2)

## 2013-08-27 LAB — CBC WITH DIFFERENTIAL/PLATELET
Basophils Absolute: 0 10*3/uL (ref 0.0–0.1)
Basophils Relative: 0 % (ref 0–1)
Eosinophils Absolute: 0.2 10*3/uL (ref 0.0–0.7)
Eosinophils Relative: 3 % (ref 0–5)
HCT: 32.7 % — ABNORMAL LOW (ref 36.0–46.0)
Hemoglobin: 10.6 g/dL — ABNORMAL LOW (ref 12.0–15.0)
Lymphocytes Relative: 19 % (ref 12–46)
Lymphs Abs: 1.7 10*3/uL (ref 0.7–4.0)
MCH: 26.6 pg (ref 26.0–34.0)
MCHC: 32.4 g/dL (ref 30.0–36.0)
MCV: 82 fL (ref 78.0–100.0)
Monocytes Absolute: 0.7 10*3/uL (ref 0.1–1.0)
Monocytes Relative: 8 % (ref 3–12)
NEUTROS ABS: 6.2 10*3/uL (ref 1.7–7.7)
Neutrophils Relative %: 70 % (ref 43–77)
PLATELETS: 279 10*3/uL (ref 150–400)
RBC: 3.99 MIL/uL (ref 3.87–5.11)
RDW: 14.4 % (ref 11.5–15.5)
WBC: 8.9 10*3/uL (ref 4.0–10.5)

## 2013-08-27 LAB — URINALYSIS, ROUTINE W REFLEX MICROSCOPIC
BILIRUBIN URINE: NEGATIVE
Glucose, UA: NEGATIVE mg/dL
Hgb urine dipstick: NEGATIVE
KETONES UR: NEGATIVE mg/dL
Leukocytes, UA: NEGATIVE
NITRITE: NEGATIVE
Protein, ur: NEGATIVE mg/dL
Specific Gravity, Urine: 1.018 (ref 1.005–1.030)
Urobilinogen, UA: 0.2 mg/dL (ref 0.0–1.0)
pH: 5 (ref 5.0–8.0)

## 2013-08-27 LAB — I-STAT CG4 LACTIC ACID, ED: Lactic Acid, Venous: 1.17 mmol/L (ref 0.5–2.2)

## 2013-08-27 LAB — RAPID URINE DRUG SCREEN, HOSP PERFORMED
Amphetamines: NOT DETECTED
Barbiturates: NOT DETECTED
Benzodiazepines: NOT DETECTED
Cocaine: NOT DETECTED
OPIATES: POSITIVE — AB
Tetrahydrocannabinol: POSITIVE — AB

## 2013-08-27 LAB — I-STAT TROPONIN, ED: Troponin i, poc: 0 ng/mL (ref 0.00–0.08)

## 2013-08-27 LAB — PRO B NATRIURETIC PEPTIDE: Pro B Natriuretic peptide (BNP): 93.5 pg/mL (ref 0–125)

## 2013-08-27 MED ORDER — TECHNETIUM TO 99M ALBUMIN AGGREGATED
3.0000 | Freq: Once | INTRAVENOUS | Status: AC | PRN
  Administered 2013-08-27: 3 via INTRAVENOUS

## 2013-08-27 MED ORDER — LORAZEPAM 1 MG PO TABS
1.0000 mg | ORAL_TABLET | Freq: Once | ORAL | Status: AC
  Administered 2013-08-27: 1 mg via ORAL
  Filled 2013-08-27: qty 1

## 2013-08-27 MED ORDER — ACETAMINOPHEN 325 MG PO TABS: 650.0000 mg | ORAL_TABLET | ORAL | Status: DC | PRN

## 2013-08-27 MED ORDER — ALUM & MAG HYDROXIDE-SIMETH 200-200-20 MG/5ML PO SUSP: 30.0000 mL | ORAL | Status: DC | PRN

## 2013-08-27 MED ORDER — SULFAMETHOXAZOLE-TMP DS 800-160 MG PO TABS
1.0000 | ORAL_TABLET | Freq: Two times a day (BID) | ORAL | Status: DC
  Administered 2013-08-28 (×2): 1 via ORAL
  Filled 2013-08-27 (×2): qty 1

## 2013-08-27 MED ORDER — VANCOMYCIN HCL 10 G IV SOLR
2000.0000 mg | INTRAVENOUS | Status: AC
  Administered 2013-08-27: 2000 mg via INTRAVENOUS
  Filled 2013-08-27: qty 2000

## 2013-08-27 MED ORDER — OXYCODONE-ACETAMINOPHEN 5-325 MG PO TABS
1.0000 | ORAL_TABLET | Freq: Four times a day (QID) | ORAL | Status: DC | PRN
  Administered 2013-08-28 (×2): 1 via ORAL
  Filled 2013-08-27 (×2): qty 1

## 2013-08-27 MED ORDER — ONDANSETRON HCL 4 MG PO TABS
4.0000 mg | ORAL_TABLET | Freq: Three times a day (TID) | ORAL | Status: DC | PRN
  Administered 2013-08-28: 4 mg via ORAL
  Filled 2013-08-27: qty 1

## 2013-08-27 MED ORDER — AMLODIPINE BESYLATE 5 MG PO TABS
5.0000 mg | ORAL_TABLET | Freq: Every day | ORAL | Status: DC
  Administered 2013-08-28: 5 mg via ORAL
  Filled 2013-08-27 (×2): qty 1

## 2013-08-27 MED ORDER — POTASSIUM CHLORIDE CRYS ER 20 MEQ PO TBCR
20.0000 meq | EXTENDED_RELEASE_TABLET | Freq: Every day | ORAL | Status: DC
  Administered 2013-08-28 (×2): 20 meq via ORAL
  Filled 2013-08-27 (×2): qty 1

## 2013-08-27 MED ORDER — FUROSEMIDE 20 MG PO TABS
40.0000 mg | ORAL_TABLET | Freq: Every day | ORAL | Status: DC | PRN
  Administered 2013-08-28: 40 mg via ORAL
  Filled 2013-08-27: qty 2

## 2013-08-27 MED ORDER — MORPHINE SULFATE 4 MG/ML IJ SOLN
4.0000 mg | Freq: Once | INTRAMUSCULAR | Status: AC
  Administered 2013-08-27: 4 mg via INTRAVENOUS
  Filled 2013-08-27: qty 1

## 2013-08-27 MED ORDER — QUETIAPINE FUMARATE 25 MG PO TABS: 150.0000 mg | ORAL_TABLET | Freq: Every day | ORAL | Status: DC

## 2013-08-27 MED ORDER — LORAZEPAM 2 MG/ML IJ SOLN
1.0000 mg | Freq: Once | INTRAMUSCULAR | Status: DC
Start: 2013-08-27 — End: 2013-08-28

## 2013-08-27 MED ORDER — ALBUTEROL SULFATE HFA 108 (90 BASE) MCG/ACT IN AERS
2.0000 | INHALATION_SPRAY | Freq: Four times a day (QID) | RESPIRATORY_TRACT | Status: DC | PRN
  Administered 2013-08-28: 2 via RESPIRATORY_TRACT
  Filled 2013-08-27: qty 6.7

## 2013-08-27 MED ORDER — VANCOMYCIN HCL IN DEXTROSE 1-5 GM/200ML-% IV SOLN: 1000.0000 mg | Freq: Two times a day (BID) | INTRAVENOUS | Status: DC

## 2013-08-27 MED ORDER — ZOLPIDEM TARTRATE 5 MG PO TABS
5.0000 mg | ORAL_TABLET | Freq: Every evening | ORAL | Status: DC | PRN
Start: 2013-08-27 — End: 2013-08-27

## 2013-08-27 MED ORDER — IBUPROFEN 400 MG PO TABS
400.0000 mg | ORAL_TABLET | Freq: Four times a day (QID) | ORAL | Status: DC | PRN
  Administered 2013-08-28: 400 mg via ORAL
  Filled 2013-08-27: qty 1

## 2013-08-27 MED ORDER — OMEGA-3-ACID ETHYL ESTERS 1 G PO CAPS
1.0000 g | ORAL_CAPSULE | Freq: Two times a day (BID) | ORAL | Status: DC
  Administered 2013-08-28: 1 g via ORAL
  Filled 2013-08-27 (×2): qty 1

## 2013-08-27 MED ORDER — PANTOPRAZOLE SODIUM 40 MG PO TBEC
40.0000 mg | DELAYED_RELEASE_TABLET | Freq: Every day | ORAL | Status: DC
  Administered 2013-08-28: 40 mg via ORAL
  Filled 2013-08-27: qty 1

## 2013-08-27 MED ORDER — CARVEDILOL 25 MG PO TABS
25.0000 mg | ORAL_TABLET | Freq: Two times a day (BID) | ORAL | Status: DC
  Administered 2013-08-28: 25 mg via ORAL
  Filled 2013-08-27 (×4): qty 1

## 2013-08-27 MED ORDER — SIMVASTATIN 20 MG PO TABS
20.0000 mg | ORAL_TABLET | Freq: Every day | ORAL | Status: DC
  Filled 2013-08-27 (×2): qty 1

## 2013-08-27 MED ORDER — FESOTERODINE FUMARATE ER 8 MG PO TB24
8.0000 mg | ORAL_TABLET | Freq: Every day | ORAL | Status: DC
  Administered 2013-08-28: 8 mg via ORAL
  Filled 2013-08-27: qty 1

## 2013-08-27 MED ORDER — ADULT MULTIVITAMIN W/MINERALS CH
1.0000 | ORAL_TABLET | Freq: Every day | ORAL | Status: DC
  Administered 2013-08-28: 1 via ORAL
  Filled 2013-08-27: qty 1

## 2013-08-27 MED ORDER — NICOTINE 21 MG/24HR TD PT24
21.0000 mg | MEDICATED_PATCH | Freq: Every day | TRANSDERMAL | Status: DC
  Filled 2013-08-27: qty 1

## 2013-08-27 MED ORDER — MOMETASONE FURO-FORMOTEROL FUM 100-5 MCG/ACT IN AERO
2.0000 | INHALATION_SPRAY | Freq: Two times a day (BID) | RESPIRATORY_TRACT | Status: DC
  Administered 2013-08-28: 2 via RESPIRATORY_TRACT
  Filled 2013-08-27: qty 8.8

## 2013-08-27 NOTE — ED Notes (Signed)
Pt aware that she is being evaluated for mental health, pt voluntarily staying for further eval at this time

## 2013-08-27 NOTE — ED Notes (Signed)
Geiple, PA informed of inability to collect second set of blood cultures, PA to inform Ward, MD, will start antibiotics as ordered

## 2013-08-27 NOTE — ED Notes (Signed)
Pt transported from x ray to [place], EKG delayed, EKG will be completed upon pt return to room, NT aware of need for prompt EKG

## 2013-08-27 NOTE — Progress Notes (Signed)
Visited with patient to provide emotional support per nurse request. Per nurse patient was being difficult. Nurse thought that a Chaplain visit would help.  After visit patient distress deceased and became more cooperative.  Patient being IVC.  08/27/13 1500  Clinical Encounter Type  Visited With Patient;Health care provider  Visit Type Spiritual support  Referral From Nurse  Spiritual Encounters  Spiritual Needs Emotional  Jaclynn Major, Chaplain,Pager [phone]

## 2013-08-27 NOTE — ED Notes (Signed)
Pt states she is hurting all over her body. States she has cellulitis in her leg and "that's hurting." States "my respiratorys bothering me." states "my fibromyalgias acting up." a&ox4, resp e/u

## 2013-08-27 NOTE — ED Notes (Signed)
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 You don't care about me. I am not the nurse here you need to do your job. That is the problem it's just a profession for you." explained the priority of care, charge RN updated, MD updated, social work at bedside

## 2013-08-27 NOTE — ED Notes (Signed)
Pt transported to Radiology 

## 2013-08-27 NOTE — Progress Notes (Signed)
VASCULAR LAB PRELIMINARY  PRELIMINARY  PRELIMINARY  PRELIMINARY  Bilateral lower extremity venous Dopplers completed.    Preliminary report:  There is no DVT or SVT noted in the bilateral lower extremities.  Meryem Haertel, RVT 08/27/2013, 11:43 AM

## 2013-08-27 NOTE — Clinical Social Work Psychosocial (Signed)
Clinical Social Work Department BRIEF PSYCHOSOCIAL ASSESSMENT 08/27/2013  Patient:  Heidi Young, Heidi Young     Account Number:  [id number]     Admit date:  08/27/2013  Clinical Social Worker:  Jennette Dubin  Date/Time:  08/27/2013 02:04 PM  Referred by:  CSW  Date Referred:  08/27/2013 Referred for  Behavioral Health Issues   Other Referral:  N/A Interview type:  Other - See comment Other interview type:   Family    PSYCHOSOCIAL DATA Living Status:  ALONE Admitted from facility:  Pt came from home. Level of care:  N/A Primary support name:  Lu Duffel [phone] Primary support relationship to patient:  CHILD, ADULT Degree of support available:   Adequate support    CURRENT CONCERNS  Other Concerns:  Pt has not followed up with mental health provider and reported that she was not here for mental health concerns.  SOCIAL WORK ASSESSMENT / PLAN CSW met with pt to explain role and determine if she needed CSW services. Pt reported to CSW that currently she requires no assistance from CSW. However, pt appeared manic in presentation. According to pt.'s son pt has not slept in 3 days and has not been taking psychotropic medicaitons.   Assessment/plan status:   Other assessment/ plan: N/A  Information/referral to community resources:  N/A  PATIENT'S/FAMILY'S RESPONSE TO PLAN OF CARE: Pt.'s family appreciatve of CSW role.     [address], [place]

## 2013-08-27 NOTE — ED Notes (Signed)
Pt very uncooperative and not answering to questions but constantly talking .

## 2013-08-27 NOTE — ED Provider Notes (Addendum)
Pt taken in t/o at approx 1645.  She is pending at VQ scan and then psychiatry admission.    VQ scan - normal.  VSS.  Pt aware of plan.  Pt in nad.  Pleasant.  Medically cleared.    Pt to be moved to C pod for disposition by psychiatry.  Home medications ordered.    Elmer Sow, MD 08/27/13 1735  Elmer Sow, MD 08/28/13 [phone]

## 2013-08-27 NOTE — ED Notes (Signed)
Lactic acid results called to nursing secretary Charlett Nose for primary nurse Abigail Butts

## 2013-08-27 NOTE — Progress Notes (Signed)
CM met patient at bedside. Role of CM explained.Patient reports she has leg swelling.Patient reports she follows up with Her PCP.No CM needs at this time.
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
## 2013-08-27 NOTE — ED Notes (Signed)
Patient with c/os of having chest pain, 12 lead ekg done, pt also stated having shortness of breath and having a fever, patient currently having stats of 100% and is afebrile Dr Christy Gentles aware of situation.

## 2013-08-27 NOTE — ED Notes (Signed)
Pt very uncooperative and not able to do psy assessment.

## 2013-08-27 NOTE — ED Notes (Signed)
Pt wanded by security, Agricultural consultant aware

## 2013-08-27 NOTE — ED Notes (Signed)
Pt speaking in complete sentences without difficulty on phone
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
## 2013-08-27 NOTE — ED Notes (Signed)
Black mobile phone with security in safe.

## 2013-08-27 NOTE — ED Provider Notes (Signed)
TIME SEEN: 10:23 AM  CHIEF COMPLAINT: Left lower extremity cellulitis, chest pain or shortness of breath, body aches  HPI: Patient is a 68 y.o. female with history of hypertension, hyperlipidemia, cardiomyopathy, asthma, fibromyalgia, schizophrenia who presents emergency department with multiple complaints. She states that since Monday, 4 days ago, she has had redness and swelling to her left lower extremity. She has had subjective fevers, body aches and feels that her body aches may be due to her fibromyalgia. She is also complaining of several days of left-sided chest pain that she is unable to characterize that has been constant associated with constant shortness of breath. No aggravating or relieving factors. She's had nausea but no vomiting. No diarrhea. She is on antibiotics currently. She denies a history of DVT or PE. No history of CAD, MI.  ROS: See HPI Constitutional: Subjective fever  Eyes: no drainage  ENT: no runny nose   Cardiovascular:   chest pain  Resp:  SOB  GI: no vomiting GU: no dysuria Integumentary: no rash  Allergy: no hives  Musculoskeletal: no leg swelling  Neurological: no slurred speech ROS otherwise negative  PAST MEDICAL HISTORY/PAST SURGICAL HISTORY:  Past Medical History  Diagnosis Date  . Chest pain   . Cardiomyopathy   . HTN (hypertension)   . Takotsubo cardiomyopathy   . HLD (hyperlipidemia)   . Asthma   . Fibromyalgia   . DJD (degenerative joint disease)   . Chronic anemia   . Obesity   . Carotid bruit     left  . Psychosis   . Lumbar herniated disc   . UTI (lower urinary tract infection)   . Schizophrenia   . Cancer     renal cell carcinoma  . Heart murmur   . Single kidney   . History of kidney cancer     unknown type. had R kidney removed b/c of CA.   Marland Kitchen Kidney stones   . GERD (gastroesophageal reflux disease)     on rare occas. - uses peptobismol    MEDICATIONS:  Prior to Admission medications   Medication Sig Start Date End  Date Taking? Authorizing Provider  albuterol (PROVENTIL HFA;VENTOLIN HFA) 108 (90 BASE) MCG/ACT inhaler Inhale 2 puffs into the lungs every 6 (six) hours as needed for wheezing.   Yes Historical Provider, MD  amLODipine (NORVASC) 5 MG tablet Take 5 mg by mouth daily with breakfast.    Yes Historical Provider, MD  ondansetron (ZOFRAN-ODT) 4 MG disintegrating tablet Take 4 mg by mouth 2 (two) times daily as needed. 06/24/13  Yes Historical Provider, MD  carvedilol (COREG) 25 MG tablet Take 25 mg by mouth 2 (two) times daily with a meal.    Historical Provider, MD  cyclobenzaprine (FLEXERIL) 10 MG tablet Take 10 mg by mouth 3 (three) times daily as needed for muscle spasms.    Historical Provider, MD  diclofenac sodium (VOLTAREN) 1 % GEL Apply 2 g topically 4 (four) times daily.    Historical Provider, MD  EPINEPHrine (EPI-PEN) 0.3 mg/0.3 mL SOAJ injection Inject 0.3 mg into the muscle once.    Historical Provider, MD  fesoterodine (TOVIAZ) 8 MG TB24 tablet Take 8 mg by mouth daily with lunch.     Historical Provider, MD  fish oil-omega-3 fatty acids 1000 MG capsule Take 1 g by mouth 2 (two) times daily.    Historical Provider, MD  furosemide (LASIX) 40 MG tablet Take 40 mg by mouth daily as needed for fluid.     Historical  Provider, MD  ibuprofen (ADVIL,MOTRIN) 200 MG tablet Take 400 mg by mouth every 6 (six) hours as needed for pain.    Historical Provider, MD  Misc Natural Products (COLON CLEANSE PO) Take 1 capsule by mouth daily.    Historical Provider, MD  mometasone-formoterol (DULERA) 100-5 MCG/ACT AERO Inhale 2 puffs into the lungs 2 (two) times daily.    Historical Provider, MD  Multiple Vitamin (MULTIVITAMIN WITH MINERALS) TABS tablet Take 1 tablet by mouth daily.    Historical Provider, MD  omeprazole (PRILOSEC) 20 MG capsule Take 20 mg by mouth 2 (two) times daily. 06/24/13   Historical Provider, MD  oxyCODONE-acetaminophen (PERCOCET/ROXICET) 5-325 MG per tablet Take 1-2 tablets by mouth every 6  (six) hours as needed for severe pain. 05/01/13   Ephraim Hamburger, MD  potassium chloride SA (K-DUR,KLOR-CON) 20 MEQ tablet Take 20 mEq by mouth daily.    Historical Provider, MD  pravastatin (PRAVACHOL) 40 MG tablet Take 40 mg by mouth daily after supper.     Historical Provider, MD  QUEtiapine (SEROQUEL) 50 MG tablet Take 150 mg by mouth at bedtime.    Historical Provider, MD  traMADol (ULTRAM) 50 MG tablet Take 50 mg by mouth every 8 (eight) hours as needed for pain.    Historical Provider, MD    ALLERGIES:  Allergies  Allergen Reactions  . Bee Venom Anaphylaxis  . Avelox [Moxifloxacin Hydrochloride]     RASH    SOCIAL HISTORY:  History  Substance Use Topics  . Smoking status: Never Smoker   . Smokeless tobacco: Not on file  . Alcohol Use: No     Comment: rare    FAMILY HISTORY: Family History  Problem Relation Age of Onset  . Coronary artery disease Mother 75  . Heart failure Mother     congestive  . Stroke Mother   . Brain cancer Father 13  . Hypertension      siblings  . Aortic stenosis      sibling    EXAM: BP 164/93  Pulse 106  Temp(Src) 98.2 F (36.8 C) (Oral)  Resp 22  SpO2 100% CONSTITUTIONAL: Alert and oriented and responds appropriately to questions. Well-appearing; well-nourished HEAD: Normocephalic EYES: Conjunctivae clear, PERRL ENT: normal nose; no rhinorrhea; moist mucous membranes; pharynx without lesions noted NECK: Supple, no meningismus, no LAD  CARD: Regular and tachycardic; S1 and S2 appreciated; no murmurs, no clicks, no rubs, no gallops RESP: Normal chest excursion without splinting or tachypnea; breath sounds clear and equal bilaterally; no wheezes, no rhonchi, no rales,  ABD/GI: Normal bowel sounds; non-distended; soft, non-tender, no rebound, no guarding BACK:  The back appears normal and is non-tender to palpation, there is no CVA tenderness EXT: Normal ROM in all joints; non-tender to palpation; bilateral pitting edema to her feet  and ankles; normal capillary refill; no cyanosis; 2+ DP pulses bilaterally and SKIN: Normal color for age and race; warm; 15 x 8 cm area of erythema without induration or fluctuance to the lateral left lower extremity NEURO: Moves all extremities equally PSYCH: The patient's mood and manner are appropriate. Grooming and personal hygiene are appropriate.  MEDICAL DECISION MAKING: Patient here with cellulitis and chest pain, shortness of breath. She is tachycardic  but otherwise hemodynamically stable.  She has no risk factors for pulmonary embolus other than age. Her tachycardia may be related to her cellulitis. We'll obtain cardiac labs, venous Doppler of her bilateral lower extremities.  Will give IV vancomycin.  I do not feel  the patient is having MI, dissection. She is very focused on her breathing but has no tachypnea, increased work of breathing or hypoxia. Have attempted to explain to patient multiple times I do not feel she needs oxygen although she is requesting it.  ED PROGRESS: Patient's cardiac labs are unremarkable. Troponin negative. I do not feel she needs her ulcerative enzymes given her pain or shortness of breath and constant for several days. No leukocytosis. Lactate normal. Chest x-ray clear. She still tachycardic and complaining of feeling short of breath although her lungs are clear. She has no risk factors for pulmonary embolus but states that she feels this is different from her asthma and is having bilateral lower extremity swelling. We'll obtain CT imaging of her chest to evaluate for possible pulmonary embolus.   1:33 PM  Pt has become increasingly upset. Patient is becoming very accusatory but is discussing some areas that have not happened. She is accusing of the conversation that we had never had. Social work has seen the patient and is concerned for psychiatric illness. She has a diagnosis of schizophrenia and is on seroquel. She denies any SI or HI or hallucinations. She is  having disorganized thinking, rapid speech. Patient's son is not bedside reports he is worried about his mother as well and states she hasn't slept in 4 days. He reports he was recently incarcerated. He states that this is not the same woman that he saw before he was incarcerated. He is very concerned about his mother. Concern for mania, will consult TTS.   2:34 PM  Spoke with [facility] with psych.  They will evaluate patient immediately. Patient agrees to stay voluntarily talked to psychiatry. Patient's venous Dopplers are negative for DVT or SVT. CTA chest and second troponin pending.  3:09 PM  Spoke with Arby Barrette with psychiatry who agrees the patient is not acting normally and appears to be manic, psychotic. She agrees the patient needs inpatient admission. Will IVC patient. Arby Barrette states patient is saying "everybody thinks I'm a Valium addict".  I discussed with psychiatry that we were not concerned the patient was drug-seeking and we have offered her Ativan and morphine which she has refused.   4:03 PM Have attempted peripheral IV access for CT angiogram of her chest multiple times. I have attempted using ultrasound guidance the patient is unable to sit still and we'll pull her arm away when attempting to obtain access. She states "My fibromyalgia is hurting me.  Don't touch me."  Since we're unable to obtain a CTA of her chest because we cannot obtain IV access, will obtain VQ scan. The VQ scan is negative, I feel patient is medically cleared and can be treated with oral antibiotics for her left lower extremity cellulitis. Arby Barrette with psychiatry feels patient needs inpatient psychiatric treatment and I agree.   EKG Interpretation  Date/Time:  Thursday August 27 2013 11:59:32 EDT Ventricular Rate:  105 PR Interval:  166 QRS Duration: 82 QT Interval:  322 QTC Calculation: 425 R Axis:   35 Text Interpretation:  Sinus tachycardia Nonspecific T abnormalities, anterior leads Baseline wander in lead(s) I II  aVR Confirmed by Laketta Soderberg,  DO, Traveon Louro ([id number]) on 08/27/2013 12:24:40 PM        [place], DO 08/27/13 1608

## 2013-08-27 NOTE — ED Notes (Signed)
Pt served by [facility] by IVC.

## 2013-08-27 NOTE — Progress Notes (Signed)
CSW met with pt and CSW explained role. Pt.'s son at bedside. Pt reported that she has need for CSW services.   [address] [place]

## 2013-08-27 NOTE — Progress Notes (Signed)
ANTIBIOTIC CONSULT NOTE - INITIAL  Pharmacy Consult for vancomycin Indication: Cellulitis  Allergies  Allergen Reactions  . Bee Venom Anaphylaxis  . Avelox [Moxifloxacin Hydrochloride]     RASH    Patient Measurements:   Adjusted Body Weight:    Vital Signs: Temp: 98.2 F (36.8 C) (03/12 0935) Temp src: Oral (03/12 0935) BP: 154/82 mmHg (03/12 1100) Pulse Rate: 104 (03/12 1100) Intake/Output from previous day:   Intake/Output from this shift:    Labs:  Recent Labs  08/27/13 1000  WBC 8.9  HGB 10.6*  PLT 279   The CrCl is unknown because both a height and weight (above a minimum accepted value) are required for this calculation. No results found for this basename: VANCOTROUGH, VANCOPEAK, VANCORANDOM, GENTTROUGH, GENTPEAK, GENTRANDOM, TOBRATROUGH, TOBRAPEAK, TOBRARND, AMIKACINPEAK, AMIKACINTROU, AMIKACIN,  in the last 72 hours   Microbiology: No results found for this or any previous visit (from the past 720 hour(s)).  Medical History: Past Medical History  Diagnosis Date  . Chest pain   . Cardiomyopathy   . HTN (hypertension)   . Takotsubo cardiomyopathy   . HLD (hyperlipidemia)   . Asthma   . Fibromyalgia   . DJD (degenerative joint disease)   . Chronic anemia   . Obesity   . Carotid bruit     left  . Psychosis   . Lumbar herniated disc   . UTI (lower urinary tract infection)   . Schizophrenia   . Cancer     renal cell carcinoma  . Heart murmur   . Single kidney   . History of kidney cancer     unknown type. had R kidney removed b/c of CA.   Marland Kitchen Kidney stones   . GERD (gastroesophageal reflux disease)     on rare occas. - uses peptobismol    Medications:   Assessment: Multiple complaints 68 y/o F presents with c/o hurting all over including cellulits on her leg, respiratory status bothering her and fibromyalgia acting up. Plan to start Vancomycin for cellulitis.  Labs: WBC 8.9. Scr 1.24 with estimated CrCl 68.  Goal of Therapy:  Vancomycin  trough level 10-15 mcg/ml  Plan:  Vancomycin 2g IV x 1 in ED, then 1g IV q12h Trough after 3-5 doses at steady state.  Alford Highland, [employer] 08/27/2013,11:26 AM

## 2013-08-27 NOTE — ED Notes (Signed)
Pts son requests to be contacted & updated on plan of care, provided last 4 digits of CSN for future encounters

## 2013-08-27 NOTE — ED Notes (Signed)
Chaplain at bedside

## 2013-08-28 DIAGNOSIS — F329 Major depressive disorder, single episode, unspecified: Secondary | ICD-10-CM | POA: Insufficient documentation

## 2013-08-28 LAB — CBG MONITORING, ED: GLUCOSE-CAPILLARY: 106 mg/dL — AB (ref 70–99)

## 2013-08-28 NOTE — ED Notes (Signed)
[facility] returned call.  Will be "a little bit" before they get here.  They will call 30 minutes prior to arrival.

## 2013-08-28 NOTE — ED Notes (Signed)
Pt walking around the nurses station with the sitter at this time.

## 2013-08-28 NOTE — ED Provider Notes (Signed)
3:35 PM Patient accepted to [facility] for inpatient psych care. Dr. Hoyle Barr is accepting physician.  Ephraim Hamburger, MD 08/28/13 1535

## 2013-08-28 NOTE — Progress Notes (Signed)
B.Amnah Breuer, MHT in with patients son Brynda Greathouse) [phone] along with Social W[facility] [place] explained process of disposition which will be to admit to [facility] for psychiatric care. Mr. Pearline Cables acknowledged understanding of recommendation. Writer will contact Mr. Pearline Cables prior to patient being transferred to [facility].

## 2013-08-28 NOTE — Progress Notes (Signed)
B.Thereasa Iannello, MHT completed placement search for patient by contacting the following facilities located below; [facility] faxed referral for review [facility] faxed referral for review Mikel Cella spoke with [facility] faxed referral for review [facility] at [facility] reports back that patient has been accepted by Dr.Lay and has been assigned to [id number]. Nurse to give report to [phone]. [facility] will call when bed is available. Writer informed attending nurse of disposition who will facilitate transfer via [place] patient is IVC

## 2013-08-28 NOTE — ED Notes (Signed)
One set of blood cx was done, patient refused to have the other set of blood cx done and ethanol level done dr. Christy Gentles md aware

## 2013-08-28 NOTE — ED Notes (Signed)
Patient personal items were inventory and sent to holding area, pt moble cell phone lock up in security

## 2013-08-28 NOTE — Progress Notes (Signed)
Writer informed patients son Brynda Greathouse) [phone] that patient is being prepared for transfer to [facility].

## 2013-08-28 NOTE — ED Notes (Signed)
Call respirtory to come and assess patient per patient request, she stated she is wheezing. Patient oxygen 99%, pt is not any distress. VSS. Standing at sink washing herself up at this time.

## 2013-08-28 NOTE — Progress Notes (Signed)
B.Taevion Sikora, MHT received report from [facility] at [facility] that patient has bed available and can be transferred. Writer informed Hildred Alamin, RN attending that patient can be transferred. Patient has been assigned to [id number].

## 2013-08-28 NOTE — Progress Notes (Signed)
Chaplain responded to referral from night chaplain to visit patient. Patient calm and cooperative this morning. She inquired about availability of chaplains: Chaplain explained that chaplains are here between 8:30 and 5pm and on-call overnight for emergencies only, but that this would likely be our only visit with her today. She expressed concern with receiving honest care. Chaplain listened empathically to her concerns, offering emotional and spiritual support and prayer. Patient was grateful for support.   Ethelene Browns [phone]

## 2013-08-30 DIAGNOSIS — R600 Localized edema: Secondary | ICD-10-CM | POA: Insufficient documentation

## 2013-08-30 DIAGNOSIS — Z85528 Personal history of other malignant neoplasm of kidney: Secondary | ICD-10-CM | POA: Insufficient documentation

## 2013-08-30 DIAGNOSIS — K219 Gastro-esophageal reflux disease without esophagitis: Secondary | ICD-10-CM | POA: Insufficient documentation

## 2013-08-30 DIAGNOSIS — F209 Schizophrenia, unspecified: Secondary | ICD-10-CM | POA: Insufficient documentation

## 2013-09-02 LAB — CULTURE, BLOOD (ROUTINE X 2): Culture: NO GROWTH

## 2013-09-05 ENCOUNTER — Emergency Department (HOSPITAL_COMMUNITY)
Admission: EM | Admit: 2013-09-05 | Discharge: 2013-09-05 | Disposition: A | Discharge status: Home / Self Care | Payer: Medicare Other | Attending: Emergency Medicine | Admitting: Emergency Medicine

## 2013-09-05 ENCOUNTER — Emergency Department (HOSPITAL_COMMUNITY): Payer: Medicare Other

## 2013-09-05 ENCOUNTER — Encounter (HOSPITAL_COMMUNITY): Payer: Self-pay | Admitting: Emergency Medicine

## 2013-09-05 DIAGNOSIS — Z8659 Personal history of other mental and behavioral disorders: Secondary | ICD-10-CM | POA: Insufficient documentation

## 2013-09-05 DIAGNOSIS — Z791 Long term (current) use of non-steroidal anti-inflammatories (NSAID): Secondary | ICD-10-CM | POA: Insufficient documentation

## 2013-09-05 DIAGNOSIS — M1990 Unspecified osteoarthritis, unspecified site: Secondary | ICD-10-CM | POA: Insufficient documentation

## 2013-09-05 DIAGNOSIS — Z9981 Dependence on supplemental oxygen: Secondary | ICD-10-CM | POA: Insufficient documentation

## 2013-09-05 DIAGNOSIS — R011 Cardiac murmur, unspecified: Secondary | ICD-10-CM | POA: Insufficient documentation

## 2013-09-05 DIAGNOSIS — Z872 Personal history of diseases of the skin and subcutaneous tissue: Secondary | ICD-10-CM | POA: Insufficient documentation

## 2013-09-05 DIAGNOSIS — Z85528 Personal history of other malignant neoplasm of kidney: Secondary | ICD-10-CM | POA: Insufficient documentation

## 2013-09-05 DIAGNOSIS — Z792 Long term (current) use of antibiotics: Secondary | ICD-10-CM | POA: Insufficient documentation

## 2013-09-05 DIAGNOSIS — K219 Gastro-esophageal reflux disease without esophagitis: Secondary | ICD-10-CM | POA: Insufficient documentation

## 2013-09-05 DIAGNOSIS — J45909 Unspecified asthma, uncomplicated: Secondary | ICD-10-CM | POA: Insufficient documentation

## 2013-09-05 DIAGNOSIS — IMO0001 Reserved for inherently not codable concepts without codable children: Secondary | ICD-10-CM | POA: Insufficient documentation

## 2013-09-05 DIAGNOSIS — N19 Unspecified kidney failure: Secondary | ICD-10-CM

## 2013-09-05 DIAGNOSIS — Z87442 Personal history of urinary calculi: Secondary | ICD-10-CM | POA: Insufficient documentation

## 2013-09-05 DIAGNOSIS — Z79899 Other long term (current) drug therapy: Secondary | ICD-10-CM | POA: Insufficient documentation

## 2013-09-05 DIAGNOSIS — Z862 Personal history of diseases of the blood and blood-forming organs and certain disorders involving the immune mechanism: Secondary | ICD-10-CM | POA: Insufficient documentation

## 2013-09-05 DIAGNOSIS — F411 Generalized anxiety disorder: Secondary | ICD-10-CM | POA: Insufficient documentation

## 2013-09-05 DIAGNOSIS — R0789 Other chest pain: Secondary | ICD-10-CM | POA: Insufficient documentation

## 2013-09-05 DIAGNOSIS — E669 Obesity, unspecified: Secondary | ICD-10-CM | POA: Insufficient documentation

## 2013-09-05 DIAGNOSIS — IMO0002 Reserved for concepts with insufficient information to code with codable children: Secondary | ICD-10-CM | POA: Insufficient documentation

## 2013-09-05 DIAGNOSIS — Z905 Acquired absence of kidney: Secondary | ICD-10-CM | POA: Insufficient documentation

## 2013-09-05 DIAGNOSIS — I10 Essential (primary) hypertension: Secondary | ICD-10-CM | POA: Insufficient documentation

## 2013-09-05 DIAGNOSIS — Z87440 Personal history of urinary (tract) infections: Secondary | ICD-10-CM | POA: Insufficient documentation

## 2013-09-05 DIAGNOSIS — R079 Chest pain, unspecified: Secondary | ICD-10-CM

## 2013-09-05 DIAGNOSIS — E860 Dehydration: Secondary | ICD-10-CM

## 2013-09-05 DIAGNOSIS — R000 Tachycardia, unspecified: Secondary | ICD-10-CM | POA: Insufficient documentation

## 2013-09-05 DIAGNOSIS — R609 Edema, unspecified: Secondary | ICD-10-CM | POA: Insufficient documentation

## 2013-09-05 LAB — I-STAT CHEM 8, ED
BUN: 40 mg/dL — AB (ref 6–23)
CREATININE: 2.2 mg/dL — AB (ref 0.50–1.10)
Calcium, Ion: 1.24 mmol/L (ref 1.13–1.30)
Chloride: 104 mEq/L (ref 96–112)
GLUCOSE: 113 mg/dL — AB (ref 70–99)
HCT: 37 % (ref 36.0–46.0)
Hemoglobin: 12.6 g/dL (ref 12.0–15.0)
POTASSIUM: 3.7 meq/L (ref 3.7–5.3)
Sodium: 142 mEq/L (ref 137–147)
TCO2: 25 mmol/L (ref 0–100)

## 2013-09-05 LAB — I-STAT TROPONIN, ED: Troponin i, poc: 0 ng/mL (ref 0.00–0.08)

## 2013-09-05 LAB — CBC WITH DIFFERENTIAL/PLATELET
Basophils Absolute: 0.1 10*3/uL (ref 0.0–0.1)
Basophils Relative: 1 % (ref 0–1)
Eosinophils Absolute: 0.3 10*3/uL (ref 0.0–0.7)
Eosinophils Relative: 5 % (ref 0–5)
HCT: 33.2 % — ABNORMAL LOW (ref 36.0–46.0)
HEMOGLOBIN: 11 g/dL — AB (ref 12.0–15.0)
LYMPHS ABS: 2.3 10*3/uL (ref 0.7–4.0)
LYMPHS PCT: 37 % (ref 12–46)
MCH: 27.2 pg (ref 26.0–34.0)
MCHC: 33.1 g/dL (ref 30.0–36.0)
MCV: 82 fL (ref 78.0–100.0)
MONOS PCT: 10 % (ref 3–12)
Monocytes Absolute: 0.6 10*3/uL (ref 0.1–1.0)
NEUTROS ABS: 2.9 10*3/uL (ref 1.7–7.7)
NEUTROS PCT: 47 % (ref 43–77)
Platelets: 425 10*3/uL — ABNORMAL HIGH (ref 150–400)
RBC: 4.05 MIL/uL (ref 3.87–5.11)
RDW: 14.1 % (ref 11.5–15.5)
WBC: 6.1 10*3/uL (ref 4.0–10.5)

## 2013-09-05 MED ORDER — SODIUM CHLORIDE 0.9 % IV SOLN
Freq: Once | INTRAVENOUS | Status: AC
  Administered 2013-09-05: 05:00:00 via INTRAVENOUS

## 2013-09-05 MED ORDER — OXYCODONE-ACETAMINOPHEN 5-325 MG PO TABS
2.0000 | ORAL_TABLET | Freq: Once | ORAL | Status: AC
  Administered 2013-09-05: 2 via ORAL
  Filled 2013-09-05: qty 2

## 2013-09-05 NOTE — ED Notes (Signed)
Patient requested and received a Happy Meal.

## 2013-09-05 NOTE — ED Provider Notes (Signed)
CSN: [id number]     Arrival date & time 09/05/13  0222 History   First MD Initiated Contact with Patient 09/05/13 0226     Chief Complaint  Patient presents with  . Chest Pain     (Consider location/radiation/quality/duration/timing/severity/associated sxs/prior Treatment) HPI Comments: 68 year old female, history of schizophrenia, history of cardiomyopathy but history of a normal cardiac catheterization showing nonobstructive disease and a normal ejection fraction in 2010. She presents to the hospital with a complaint of chest pain described as an "elephant sitting on my chest". She states that she was recently released from the psychiatric hospital, this was confirmed by the medical record showing that on March 12 she was transferred to [facility]. She spent approximately one week at that location and was discharged. When she went home she was unable to obtain the correct oxygen machine, she states that her concentrator was not working appropriately and she called EMS because of developing a chest discomfort at that time. This has waxed and waned but she states it is coming back at this time. She states "I know this is angina even though I have never had bad". She denies any significant swelling in her legs and in fact states that the bug bite that caused the cellulitis on the dorsum of her left foot has gradually improved. She denies nausea, shortness of breath is improved on oxygen by paramedics at 2 L. She is on baseline 2 L at home.  Patient is a 68 y.o. female presenting with chest pain. The history is provided by the patient, medical records and the EMS personnel.  Chest Pain   Past Medical History  Diagnosis Date  . Chest pain   . Cardiomyopathy   . HTN (hypertension)   . Takotsubo cardiomyopathy   . HLD (hyperlipidemia)   . Asthma   . Fibromyalgia   . DJD (degenerative joint disease)   . Chronic anemia   . Obesity   . Carotid bruit     left  . Psychosis   . Lumbar  herniated disc   . UTI (lower urinary tract infection)   . Schizophrenia   . Cancer     renal cell carcinoma  . Heart murmur   . Single kidney   . History of kidney cancer     unknown type. had R kidney removed b/c of CA.   Marland Kitchen Kidney stones   . GERD (gastroesophageal reflux disease)     on rare occas. - uses peptobismol   Past Surgical History  Procedure Laterality Date  . Nephrectomy  1990's    right, secondary to cyst  . Breast lumpectomy  90's    left, cyst  . Cystectomy  90's    back (left)  . Total knee arthroplasty  2005    right  . Cesarean section  1980    x1  . Cardiac catheterization  2010    no intervention  . Total knee arthroplasty Left 03/25/2013    Procedure: TOTAL KNEE ARTHROPLASTY;  Surgeon: Alta Corning, MD;  Location: [facility];  Service: Orthopedics;  Laterality: Left;   Family History  Problem Relation Age of Onset  . Coronary artery disease Mother 64  . Heart failure Mother     congestive  . Stroke Mother   . Brain cancer Father 43  . Hypertension      siblings  . Aortic stenosis      sibling   History  Substance Use Topics  . Smoking status: Never Smoker   .  Smokeless tobacco: Not on file  . Alcohol Use: No     Comment: rare   OB History   Grav Para Term Preterm Abortions TAB SAB Ect Mult Living                 Review of Systems  Cardiovascular: Positive for chest pain.  All other systems reviewed and are negative.      Allergies  Bee venom; Avelox; and Nsaids  Home Medications   Current Outpatient Rx  Name  Route  Sig  Dispense  Refill  . albuterol (PROVENTIL HFA;VENTOLIN HFA) 108 (90 BASE) MCG/ACT inhaler   Inhalation   Inhale 2 puffs into the lungs every 6 (six) hours as needed for wheezing.         Marland Kitchen amLODipine (NORVASC) 5 MG tablet   Oral   Take 5 mg by mouth daily with breakfast.          . carvedilol (COREG) 25 MG tablet   Oral   Take 25 mg by mouth 2 (two) times daily with a meal.         . cephALEXin  (KEFLEX) 500 MG capsule   Oral   Take 1 capsule by mouth 4 (four) times daily. For cellulitis, 10 day course beginning 09/04/13         . cyclobenzaprine (FLEXERIL) 10 MG tablet   Oral   Take 10 mg by mouth 3 (three) times daily as needed for muscle spasms.         . diclofenac sodium (VOLTAREN) 1 % GEL   Topical   Apply 2 g topically 4 (four) times daily.         . fesoterodine (TOVIAZ) 8 MG TB24 tablet   Oral   Take 8 mg by mouth daily with lunch.          . fish oil-omega-3 fatty acids 1000 MG capsule   Oral   Take 1 g by mouth 2 (two) times daily.         . furosemide (LASIX) 40 MG tablet   Oral   Take 40 mg by mouth 2 (two) times daily.          Marland Kitchen levocetirizine (XYZAL) 5 MG tablet   Oral   Take 1 tablet by mouth daily.         . Misc Natural Products (COLON CLEANSE PO)   Oral   Take 1 capsule by mouth daily.         . mometasone-formoterol (DULERA) 100-5 MCG/ACT AERO   Inhalation   Inhale 2 puffs into the lungs 2 (two) times daily.         . Multiple Vitamin (MULTIVITAMIN WITH MINERALS) TABS tablet   Oral   Take 1 tablet by mouth daily.         Marland Kitchen omeprazole (PRILOSEC) 20 MG capsule   Oral   Take 40 mg by mouth daily.          . ondansetron (ZOFRAN-ODT) 4 MG disintegrating tablet   Oral   Take 4 mg by mouth 2 (two) times daily as needed.         . pantoprazole (PROTONIX) 40 MG tablet   Oral   Take 1 tablet by mouth 2 (two) times daily.         . polyethylene glycol powder (GLYCOLAX/MIRALAX) powder   Oral   Take 17 g by mouth daily. Chronic constipation         . potassium chloride SA (  K-DUR,KLOR-CON) 20 MEQ tablet   Oral   Take 20 mEq by mouth daily.         . pravastatin (PRAVACHOL) 40 MG tablet   Oral   Take 40 mg by mouth daily after supper.          . traMADol (ULTRAM) 50 MG tablet   Oral   Take 50 mg by mouth every 8 (eight) hours as needed for pain.         . traZODone (DESYREL) 50 MG tablet   Oral    Take 1 tablet by mouth at bedtime.         Marland Kitchen EPINEPHrine (EPI-PEN) 0.3 mg/0.3 mL SOAJ injection   Intramuscular   Inject 0.3 mg into the muscle once.          BP 123/59  Pulse 104  Temp(Src) 98.1 F (36.7 C) (Oral)  Resp 17  SpO2 100% Physical Exam  Nursing note and vitals reviewed. Constitutional: She appears well-developed and well-nourished. No distress.  HENT:  Head: Normocephalic and atraumatic.  Mouth/Throat: Oropharynx is clear and moist. No oropharyngeal exudate.  Eyes: Conjunctivae and EOM are normal. Pupils are equal, round, and reactive to light. Right eye exhibits no discharge. Left eye exhibits no discharge. No scleral icterus.  Neck: Normal range of motion. Neck supple. No JVD present. No thyromegaly present.  Cardiovascular: Regular rhythm, normal heart sounds and intact distal pulses.  Exam reveals no gallop and no friction rub.   No murmur heard. Tachycardic to 115 beats per minute  Pulmonary/Chest: Effort normal and breath sounds normal. No respiratory distress. She has no wheezes. She has no rales.  Abdominal: Soft. Bowel sounds are normal. She exhibits no distension and no mass. There is no tenderness.  Musculoskeletal: Normal range of motion. She exhibits no edema and no tenderness.  Swelling without pitting edema of the bilateral lower extremities, symmetrical  Lymphadenopathy:    She has no cervical adenopathy.  Neurological: She is alert. Coordination normal.  Skin: Skin is warm and dry. No rash noted. No erythema.  Psychiatric: She has a normal mood and affect. Her behavior is normal.    ED Course  Procedures (including critical care time) Labs Review Labs Reviewed  CBC WITH DIFFERENTIAL - Abnormal; Notable for the following:    Hemoglobin 11.0 (*)    HCT 33.2 (*)    Platelets 425 (*)    All other components within normal limits  I-STAT CHEM 8, ED - Abnormal; Notable for the following:    BUN 40 (*)    Creatinine, Ser 2.20 (*)    Glucose,  Bld 113 (*)    All other components within normal limits  Randolm Idol, ED   Imaging Review Dg Chest Port 1 View  09/05/2013   CLINICAL DATA:  Shortness of breath.  EXAM: PORTABLE CHEST - 1 VIEW  COMPARISON:  NM PULMONARY PERFUSION dated 08/27/2013; DG CHEST 2 VIEW dated 08/27/2013; DG CHEST 2 VIEW dated 06/07/2013; CT ANGIO CHEST W/CM &/OR WO/CM dated 05/01/2013  FINDINGS: Cardiomediastinal silhouette unremarkable and unchanged. Lungs clear. Bronchovascular markings normal. Pulmonary vascularity normal. No visible pleural effusions. No pneumothorax. Stable chronic elevation of the right hemidiaphragm.  IMPRESSION: No acute cardiopulmonary disease.   Electronically Signed   By: Evangeline Dakin M.D.   On: 09/05/2013 03:47     EKG Interpretation   Date/Time:  Saturday September 05 2013 02:36:49 EDT Ventricular Rate:  110 PR Interval:  168 QRS Duration: 83 QT Interval:  347 QTC Calculation:  469 R Axis:   49 Text Interpretation:  Sinus tachycardia Borderline T abnormalities,  anterior leads Baseline wander in lead(s) II III aVF Abnormal ekg Since  last tracing rate faster Confirmed by Kalene Cutler  MD, Merrilee Ancona ([id number]) on  09/05/2013 2:45:25 AM      MDM   Final diagnoses:  Chest pain  Renal failure  Dehydration    The patient does have a slight pressured speech and does appear mildly anxious however she is giving very clear history when redirected. Her EKG shows a sinus tachycardia but no ischemia, history is reassuring that this is nonobstructive chest pain. She was anxious when she could not get her oxygen take looked up appropriately and according to paramedics that was the reason for her call to 911.  The patient was reevaluated and had significant improvement in her symptoms with no chest pain her laboratory workup showed acute renal failure. This is likely related to dehydration as she had an elevated BUN. I talked to the patient at length regarding the need for further treatment and  likely admission to the hospital. She refuses to be admitted to the hospital and wants to go home. I have given her IV fluids and recommended that she followup in the outpatient setting within 2 days for recheck of her kidney function. She is agreeable to this plan. I have also asked her to stop taking Lasix until followup with her doctor in 2 days  Meds given in ED:  Medications  0.9 %  sodium chloride infusion ( Intravenous Stopped 09/05/13 0644)  oxyCODONE-acetaminophen (PERCOCET/ROXICET) 5-325 MG per tablet 2 tablet (2 tablets Oral Given 09/05/13 0449)    New Prescriptions   No medications on file      Johnna Acosta, MD 09/05/13 [phone]

## 2013-09-05 NOTE — ED Notes (Signed)
Pt to ED via [facility] for evaluation of chest pain that woke patient up from sleep- pt is on 2L O2 all the time, when she woke up she realized her oxygen was disconnected and became anxious and CP got worse.  Pt describes pain as a heaviness to her chest, alert and oriented X 4, pt speaking in full sentences upon arrival to ED, sinus tachycardia noted on monitor.  IV in place, EMS administered 324 ASA and 1 nitro, pain currently 6/10.

## 2013-09-05 NOTE — ED Notes (Signed)
Pt reports being "bit by a bug" a week ago, swelling noted to left lower leg.

## 2013-09-05 NOTE — Discharge Instructions (Signed)
Your testing today shows that your kidney's are not working well.  You have told me that you do not want to be admitted to the hospital - That being the case, I have recommended that you follow up with your doctor in 2 days for a recheck of your kidney function.  This is VERY important.  Drink plenty of fluids and do not use the Lasix until you follow up with your doctor.  Please call your doctor for a followup appointment within 24-48 hours. When you talk to your doctor please let them know that you were seen in the emergency department and have them acquire all of your records so that they can discuss the findings with you and formulate a treatment plan to fully care for your new and ongoing problems.

## 2013-09-29 ENCOUNTER — Emergency Department (HOSPITAL_COMMUNITY): Payer: Medicare Other

## 2013-09-29 ENCOUNTER — Encounter (HOSPITAL_COMMUNITY): Payer: Self-pay | Admitting: Emergency Medicine

## 2013-09-29 ENCOUNTER — Emergency Department (HOSPITAL_COMMUNITY)
Admission: EM | Admit: 2013-09-29 | Discharge: 2013-09-29 | Disposition: A | Discharge status: Home / Self Care | Payer: Medicare Other | Attending: Emergency Medicine | Admitting: Emergency Medicine

## 2013-09-29 DIAGNOSIS — Z79899 Other long term (current) drug therapy: Secondary | ICD-10-CM | POA: Insufficient documentation

## 2013-09-29 DIAGNOSIS — Z87442 Personal history of urinary calculi: Secondary | ICD-10-CM | POA: Insufficient documentation

## 2013-09-29 DIAGNOSIS — I10 Essential (primary) hypertension: Secondary | ICD-10-CM | POA: Insufficient documentation

## 2013-09-29 DIAGNOSIS — Z85528 Personal history of other malignant neoplasm of kidney: Secondary | ICD-10-CM | POA: Insufficient documentation

## 2013-09-29 DIAGNOSIS — IMO0002 Reserved for concepts with insufficient information to code with codable children: Secondary | ICD-10-CM | POA: Insufficient documentation

## 2013-09-29 DIAGNOSIS — F411 Generalized anxiety disorder: Secondary | ICD-10-CM | POA: Insufficient documentation

## 2013-09-29 DIAGNOSIS — R000 Tachycardia, unspecified: Secondary | ICD-10-CM | POA: Insufficient documentation

## 2013-09-29 DIAGNOSIS — Z87440 Personal history of urinary (tract) infections: Secondary | ICD-10-CM | POA: Insufficient documentation

## 2013-09-29 DIAGNOSIS — Z8739 Personal history of other diseases of the musculoskeletal system and connective tissue: Secondary | ICD-10-CM | POA: Insufficient documentation

## 2013-09-29 DIAGNOSIS — Z8659 Personal history of other mental and behavioral disorders: Secondary | ICD-10-CM | POA: Insufficient documentation

## 2013-09-29 DIAGNOSIS — D649 Anemia, unspecified: Secondary | ICD-10-CM

## 2013-09-29 DIAGNOSIS — Z9889 Other specified postprocedural states: Secondary | ICD-10-CM | POA: Insufficient documentation

## 2013-09-29 DIAGNOSIS — Z905 Acquired absence of kidney: Secondary | ICD-10-CM | POA: Insufficient documentation

## 2013-09-29 DIAGNOSIS — IMO0001 Reserved for inherently not codable concepts without codable children: Secondary | ICD-10-CM | POA: Insufficient documentation

## 2013-09-29 DIAGNOSIS — R011 Cardiac murmur, unspecified: Secondary | ICD-10-CM | POA: Insufficient documentation

## 2013-09-29 DIAGNOSIS — E669 Obesity, unspecified: Secondary | ICD-10-CM | POA: Insufficient documentation

## 2013-09-29 DIAGNOSIS — K219 Gastro-esophageal reflux disease without esophagitis: Secondary | ICD-10-CM | POA: Insufficient documentation

## 2013-09-29 DIAGNOSIS — E785 Hyperlipidemia, unspecified: Secondary | ICD-10-CM | POA: Insufficient documentation

## 2013-09-29 DIAGNOSIS — J45901 Unspecified asthma with (acute) exacerbation: Secondary | ICD-10-CM | POA: Insufficient documentation

## 2013-09-29 DIAGNOSIS — M7989 Other specified soft tissue disorders: Secondary | ICD-10-CM | POA: Insufficient documentation

## 2013-09-29 LAB — BASIC METABOLIC PANEL
BUN: 18 mg/dL (ref 6–23)
CHLORIDE: 103 meq/L (ref 96–112)
CO2: 24 meq/L (ref 19–32)
Calcium: 9.9 mg/dL (ref 8.4–10.5)
Creatinine, Ser: 1.26 mg/dL — ABNORMAL HIGH (ref 0.50–1.10)
GFR calc Af Amer: 50 mL/min — ABNORMAL LOW (ref >90)
GFR calc non Af Amer: 43 mL/min — ABNORMAL LOW (ref >90)
Glucose, Bld: 87 mg/dL (ref 70–99)
Potassium: 3.4 mEq/L — ABNORMAL LOW (ref 3.7–5.3)
SODIUM: 144 meq/L (ref 137–147)

## 2013-09-29 LAB — I-STAT TROPONIN, ED: TROPONIN I, POC: 0.01 ng/mL (ref 0.00–0.08)

## 2013-09-29 LAB — CBC WITH DIFFERENTIAL/PLATELET
Basophils Absolute: 0 10*3/uL (ref 0.0–0.1)
Basophils Relative: 0 % (ref 0–1)
Eosinophils Absolute: 0.3 10*3/uL (ref 0.0–0.7)
Eosinophils Relative: 4 % (ref 0–5)
HCT: 29.4 % — ABNORMAL LOW (ref 36.0–46.0)
Hemoglobin: 9.2 g/dL — ABNORMAL LOW (ref 12.0–15.0)
LYMPHS PCT: 30 % (ref 12–46)
Lymphs Abs: 1.7 10*3/uL (ref 0.7–4.0)
MCH: 26.3 pg (ref 26.0–34.0)
MCHC: 31.3 g/dL (ref 30.0–36.0)
MCV: 84 fL (ref 78.0–100.0)
MONO ABS: 0.4 10*3/uL (ref 0.1–1.0)
Monocytes Relative: 7 % (ref 3–12)
NEUTROS ABS: 3.3 10*3/uL (ref 1.7–7.7)
Neutrophils Relative %: 59 % (ref 43–77)
PLATELETS: 347 10*3/uL (ref 150–400)
RBC: 3.5 MIL/uL — ABNORMAL LOW (ref 3.87–5.11)
RDW: 14.9 % (ref 11.5–15.5)
WBC: 5.6 10*3/uL (ref 4.0–10.5)

## 2013-09-29 MED ORDER — CYCLOBENZAPRINE HCL 10 MG PO TABS
10.0000 mg | ORAL_TABLET | Freq: Once | ORAL | Status: AC
  Administered 2013-09-29: 10 mg via ORAL
  Filled 2013-09-29: qty 1

## 2013-09-29 MED ORDER — TRAMADOL HCL 50 MG PO TABS
50.0000 mg | ORAL_TABLET | Freq: Once | ORAL | Status: AC
  Administered 2013-09-29: 50 mg via ORAL
  Filled 2013-09-29: qty 1

## 2013-09-29 NOTE — ED Notes (Signed)
Patient transported to X-ray 

## 2013-09-29 NOTE — ED Notes (Signed)
Pt refused to take her medication while this RN was in the room, pt began yelling at this RN again stating this RN had "deemed her as being uncooperative patient as we always do." This RN asked pt if she would please take the medication so she could go to XR and when she returned we would get her blood work then. Pt refused phlebotomy to draw her blood work because she was in the middle of washing her hands. Security standing outside of pt's room during this event. Marisella RN agreed to give pt her medication upon return from XR.

## 2013-09-29 NOTE — ED Notes (Signed)
Pt resting allowed [place] to do ekg. Pt wanted o2 which was placed on her at 2 l [place]

## 2013-09-29 NOTE — ED Notes (Signed)
Pt yelling at nurse states is on home o2 but did not bring it w/ her  Has a h/a states  Does not want to be touched and does not to talk till she gets some care states hates this F------ place and it is all BS

## 2013-09-29 NOTE — ED Notes (Signed)
Josh EDP at bedside.

## 2013-09-29 NOTE — Discharge Instructions (Signed)
Please read and follow all provided instructions.  Your diagnoses today include:  1. Leg swelling     Tests performed today include:  An EKG of your heart  A chest x-ray - does not show fluid in lungs or pneumonia  Cardiac enzymes - a blood test for heart muscle damage  Blood counts and electrolytes  Vital signs. See below for your results today.   Medications prescribed:   None  Take any prescribed medications only as directed.  Instructions for home: Double your Lasix (furosemide) dose for the next 5 days. Take 80mg  twice a day to help with the swelling in your legs.   Follow-up instructions: Please follow-up with your primary care provider in the next 3 days for further evaluation of your symptoms.   Return instructions:  SEEK IMMEDIATE MEDICAL ATTENTION IF:  You have severe chest pain, especially if the pain is crushing or pressure-like and spreads to the arms, back, neck, or jaw, or if you have sweating, nausea (feeling sick to your stomach), or shortness of breath. THIS IS AN EMERGENCY. Don't wait to see if the pain will go away. Get medical help at once. Call 911 or 0 (operator). DO NOT drive yourself to the hospital.   Your chest pain gets worse and does not go away with rest.   You have an attack of chest pain lasting longer than usual, despite rest and treatment with the medications your caregiver has prescribed.   You wake from sleep with chest pain or shortness of breath.  You feel dizzy or faint.  You have chest pain not typical of your usual pain for which you originally saw your caregiver.   You have any other emergent concerns regarding your health.  Additional Information:  Your vital signs today were: BP 119/56   Pulse 88   Temp(Src) 97.3 F (36.3 C) (Oral)   Resp 17   SpO2 100% If your blood pressure (BP) was elevated above 135/85 this visit, please have this repeated by your doctor within one month. --------------

## 2013-09-29 NOTE — ED Notes (Addendum)
Pt yelling at this RN, stating "I'm SOB, yall are treating me like a piece of crap again, I want you to look at the chart I cant talk, I need respiratory, yall do this to me all the time, it's all in the chart." This RN was trying to assess pt why pt came to the ED for evaluation. Pt began screaming and yelling at this RN, pt also called her family requesting them to come up here she stated to the family member over the phone "they are treating me like crap again, I need you to come up here again, they are being nasty to me again, saying I'm being uncooperative again like I always do, they can just look in my chart but they want." Pt refused to allow this RN to place her on the monitor, BP cuff, or continuous pulse ox. Pt yelled at this RN and stated "get the [facility] out of my room you Fucking bitch." At this point [facility] came in to also to assess pt and to take over pt's care per pt's request, pt then proceeded to curse and yell at [facility] also calling her inappropriate names while using profanity. Jerry Caras charge RN contacted for assistance with pt.

## 2013-09-29 NOTE — ED Notes (Signed)
Case manager and son into see pt

## 2013-09-29 NOTE — ED Provider Notes (Signed)
  Face-to-face evaluation   History: She's had leg swelling for several days.  Physical exam: Elderly, alert female, who is moderately agitated, but calms when being interviewed. She has bilateral pitting edema 3+. There is mild tenderness of the lower legs, but there is no difference in examination between the 2 lower legs  Medical screening examination/treatment/procedure(s) were conducted as a shared visit with non-physician practitioner(s) and myself.  I personally evaluated the patient during the encounter  Richarda Blade, MD 09/29/13 2002

## 2013-09-29 NOTE — ED Notes (Signed)
Call No answer  

## 2013-09-29 NOTE — ED Notes (Signed)
Edd Arbour AD RN at bedside to speak with pt's son.

## 2013-09-29 NOTE — ED Notes (Signed)
Kristi RN at bedside talking with patient

## 2013-09-29 NOTE — ED Provider Notes (Signed)
CSN: [id number]     Arrival date & time 09/29/13  1107 History   First MD Initiated Contact with Patient 09/29/13 1222     Chief Complaint  Patient presents with  . Cellulitis  . Shortness of Breath     (Consider location/radiation/quality/duration/timing/severity/associated sxs/prior Treatment) HPI Comments: Patient with history of bipolar/schizophrenia, history of cardiomyopathy but history of a normal cardiac catheterization showing nonobstructive disease and a normal ejection fraction in 2010 -- presents with complaint of whole body aching, lower extremity swelling, shortness of breath. Patient states that she had a bee on her yesterday and thinks she was stung on the arm, however did not feel a sting. Patient states that she used her EpiPen but did not come to the hospital. Patient states that she feels her lower extremities are more swollen. No fever, CP, abd pain. No h/o DVT or blood clots despite work-up with V/Q and CT in past. Recently refused admission for AKI (08/2013). The onset of this condition was acute. The course is constant. Aggravating factors: none. Alleviating factors: none.    Patient is a 68 y.o. female presenting with shortness of breath. The history is provided by the patient and medical records.  Shortness of Breath Associated symptoms: no abdominal pain, no chest pain, no cough, no fever, no headaches, no rash, no sore throat, no vomiting and no wheezing     Past Medical History  Diagnosis Date  . Chest pain   . Cardiomyopathy   . HTN (hypertension)   . Takotsubo cardiomyopathy   . HLD (hyperlipidemia)   . Asthma   . Fibromyalgia   . DJD (degenerative joint disease)   . Chronic anemia   . Obesity   . Carotid bruit     left  . Psychosis   . Lumbar herniated disc   . UTI (lower urinary tract infection)   . Schizophrenia   . Cancer     renal cell carcinoma  . Heart murmur   . Single kidney   . History of kidney cancer     unknown type. had R kidney  removed b/c of CA.   Marland Kitchen Kidney stones   . GERD (gastroesophageal reflux disease)     on rare occas. - uses peptobismol   Past Surgical History  Procedure Laterality Date  . Nephrectomy  1990's    right, secondary to cyst  . Breast lumpectomy  90's    left, cyst  . Cystectomy  90's    back (left)  . Total knee arthroplasty  2005    right  . Cesarean section  1980    x1  . Cardiac catheterization  2010    no intervention  . Total knee arthroplasty Left 03/25/2013    Procedure: TOTAL KNEE ARTHROPLASTY;  Surgeon: Alta Corning, MD;  Location: [facility];  Service: Orthopedics;  Laterality: Left;   Family History  Problem Relation Age of Onset  . Coronary artery disease Mother 22  . Heart failure Mother     congestive  . Stroke Mother   . Brain cancer Father 25  . Hypertension      siblings  . Aortic stenosis      sibling   History  Substance Use Topics  . Smoking status: Never Smoker   . Smokeless tobacco: Not on file  . Alcohol Use: No     Comment: rare   OB History   Grav Para Term Preterm Abortions TAB SAB Ect Mult Living  Review of Systems  Constitutional: Negative for fever.  HENT: Negative for rhinorrhea and sore throat.   Eyes: Negative for redness.  Respiratory: Positive for shortness of breath. Negative for cough and wheezing.   Cardiovascular: Positive for leg swelling. Negative for chest pain.  Gastrointestinal: Negative for nausea, vomiting, abdominal pain and diarrhea.  Genitourinary: Negative for dysuria.  Musculoskeletal: Positive for myalgias.  Skin: Negative for rash.  Neurological: Negative for headaches.  Psychiatric/Behavioral: The patient is nervous/anxious.     Allergies  Bee venom; Avelox; and Nsaids  Home Medications   Prior to Admission medications   Medication Sig Start Date End Date Taking? Authorizing Provider  albuterol (PROVENTIL HFA;VENTOLIN HFA) 108 (90 BASE) MCG/ACT inhaler Inhale 2 puffs into the lungs every 6  (six) hours as needed for wheezing.   Yes Historical Provider, MD  amLODipine (NORVASC) 5 MG tablet Take 5 mg by mouth daily with breakfast.    Yes Historical Provider, MD  carvedilol (COREG) 25 MG tablet Take 25 mg by mouth 2 (two) times daily with a meal.   Yes Historical Provider, MD  cyclobenzaprine (FLEXERIL) 10 MG tablet Take 10 mg by mouth 3 (three) times daily as needed for muscle spasms.   Yes Historical Provider, MD  EPINEPHrine (EPI-PEN) 0.3 mg/0.3 mL SOAJ injection Inject 0.3 mg into the muscle once.   Yes Historical Provider, MD  fesoterodine (TOVIAZ) 8 MG TB24 tablet Take 8 mg by mouth daily with lunch.    Yes Historical Provider, MD  fish oil-omega-3 fatty acids 1000 MG capsule Take 1 g by mouth 2 (two) times daily.   Yes Historical Provider, MD  furosemide (LASIX) 40 MG tablet Take 40 mg by mouth 2 (two) times daily.    Yes Historical Provider, MD  levocetirizine (XYZAL) 5 MG tablet Take 1 tablet by mouth daily. 09/04/13  Yes Historical Provider, MD  mometasone-formoterol (DULERA) 100-5 MCG/ACT AERO Inhale 2 puffs into the lungs 2 (two) times daily.   Yes Historical Provider, MD  omeprazole (PRILOSEC) 20 MG capsule Take 40 mg by mouth daily.  06/24/13  Yes Historical Provider, MD  ondansetron (ZOFRAN-ODT) 4 MG disintegrating tablet Take 4 mg by mouth 2 (two) times daily as needed. 06/24/13  Yes Historical Provider, MD  potassium chloride SA (K-DUR,KLOR-CON) 20 MEQ tablet Take 20 mEq by mouth daily.   Yes Historical Provider, MD  pravastatin (PRAVACHOL) 40 MG tablet Take 40 mg by mouth daily after supper.    Yes Historical Provider, MD  traMADol (ULTRAM) 50 MG tablet Take 50 mg by mouth every 8 (eight) hours as needed for pain.   Yes Historical Provider, MD   BP 154/86  Pulse 102  Temp(Src) 97.3 F (36.3 C) (Oral)  Resp 16  SpO2 100%  Physical Exam  Nursing note and vitals reviewed. Constitutional: She appears well-developed and well-nourished.  HENT:  Head: Normocephalic and  atraumatic.  Mouth/Throat: Oropharynx is clear and moist and mucous membranes are normal. Mucous membranes are not dry.  Eyes: Conjunctivae are normal. Right eye exhibits no discharge. Left eye exhibits no discharge.  Neck: Trachea normal and normal range of motion. Neck supple. Normal carotid pulses and no JVD present. No muscular tenderness present. Carotid bruit is not present. No tracheal deviation present.  Cardiovascular: Normal rate, regular rhythm, S1 normal, S2 normal, normal heart sounds and intact distal pulses.  Exam reveals no decreased pulses.   No murmur heard. Pulmonary/Chest: Effort normal and breath sounds normal. No respiratory distress. She has no wheezes. She  has no rales. She exhibits no tenderness.  Abdominal: Soft. Normal aorta and bowel sounds are normal. There is no tenderness. There is no rebound and no guarding.  Musculoskeletal: Normal range of motion. She exhibits edema and tenderness.  1+ edema, symmetric, bilateral LE. Patient c/o pain anywhere she is touched on extremities. No signs of cellulitis.   Neurological: She is alert.  Skin: Skin is warm and dry. No rash noted. She is not diaphoretic. No cyanosis or erythema. No pallor.  Psychiatric: Her affect is labile.    ED Course  Procedures (including critical care time) Labs Review Labs Reviewed  CBC WITH DIFFERENTIAL - Abnormal; Notable for the following:    RBC 3.50 (*)    Hemoglobin 9.2 (*)    HCT 29.4 (*)    All other components within normal limits  BASIC METABOLIC PANEL - Abnormal; Notable for the following:    Potassium 3.4 (*)    Creatinine, Ser 1.26 (*)    GFR calc non Af Amer 43 (*)    GFR calc Af Amer 50 (*)    All other components within normal limits  Randolm Idol, ED    Imaging Review Dg Chest 2 View  09/29/2013   CLINICAL DATA:  Short of breath.  History of asthma.  EXAM: CHEST  2 VIEW  COMPARISON:  Prior chest x-ray 09/05/2013  FINDINGS: Stable cardiac and mediastinal contours.  Stable thickening of the right paratracheal stripe likely secondary 2 prominent underlying vascular structures. Atherosclerotic calcification noted in the transverse aorta. Chronic elevation of the right hemidiaphragm is similar compared to prior. Slightly increased bibasilar linear opacities favored to reflect atelectasis. Linear atelectasis noted in the lingula. No focal airspace consolidation, pleural effusion or pneumothorax. No suspicious pulmonary nodule. No acute osseous abnormality.  IMPRESSION: 1. Interval development of mild lingular and right middle lobe subsegmental atelectasis. 2. Otherwise, no acute cardiopulmonary process. 3. Aortic atherosclerosis.   Electronically Signed   By: Jacqulynn Cadet M.D.   On: 09/29/2013 13:16     EKG Interpretation   Date/Time:  Tuesday September 29 2013 14:58:57 EDT Ventricular Rate:  84 PR Interval:  161 QRS Duration: 88 QT Interval:  442 QTC Calculation: 522 R Axis:   58 Text Interpretation:  Sinus rhythm Borderline T abnormalities, anterior  leads Prolonged QT interval Since last tracing rate slower and QT is  prolonged Confirmed by Eulis Foster  MD, ELLIOTT [phone]) on 09/29/2013 3:06:37 PM      Patient seen and examined. Work-up initiated. Medications ordered. Previous records reviewed. No signs of anaphylaxis on exam.   Vital signs reviewed and are as follows: Filed Vitals:   09/29/13 1205  BP: 154/86  Pulse: 102  Temp: 97.3 F (36.3 C)  Resp: 16   Labs and results discussed with Dr. Eulis Foster who has seen patient.   Pt informed of al results. She is to double Lasix for next 5 days. She is to also f/u with Dr. Jeanie Cooks for recheck of hemoglobin and edema.   Patient urged to return with worsening symptoms, including CP/SOB, fever or other concerns. Patient verbalized understanding and agrees with plan.    MDM   Final diagnoses:  Leg swelling  Anemia   Leg swelling: bilateral, symmetric, no JVD, no signs of CHF. Patient with multiple  previous evaluations for DVT/PE which were negative. Given no new findings that are different, suggestive of PE/DVT, do not feel repeat evaluation needs to be done today. Patient has been tachycardic every visit over past 6 months  and mild tachycardia present today. Will treat by increasing lasix dose temporarily. Patient has PCP f/u and agrees to do so. No signs of cellulitis on exam. No signs of anaphylaxis. Do not suspect ACS: EKG unchanged, neg trop, no CP.   SOB: Mild, patient on O2 at home. She walked from ED without assistance.   Anemia: Slightly low hgb than baseline, no gross GI bleed per history. Do not suspect symptoms related to anemia. VSS. PCP f/u is indicated.  Of note, creatinine improved today from previous ED visit. No AKI.   No dangerous or life-threatening conditions suspected or identified by history, physical exam, and by work-up. No indications for hospitalization identified.      Carlisle Cater, PA-C 09/29/13 [phone]

## 2013-10-04 ENCOUNTER — Emergency Department (HOSPITAL_COMMUNITY)
Admission: EM | Admit: 2013-10-04 | Discharge: 2013-10-05 | Disposition: A | Discharge status: Home / Self Care | Payer: Medicare Other | Attending: Emergency Medicine | Admitting: Emergency Medicine

## 2013-10-04 ENCOUNTER — Emergency Department (HOSPITAL_COMMUNITY): Payer: Medicare Other

## 2013-10-04 ENCOUNTER — Encounter (HOSPITAL_COMMUNITY): Payer: Self-pay | Admitting: Emergency Medicine

## 2013-10-04 DIAGNOSIS — I10 Essential (primary) hypertension: Secondary | ICD-10-CM | POA: Insufficient documentation

## 2013-10-04 DIAGNOSIS — F319 Bipolar disorder, unspecified: Secondary | ICD-10-CM | POA: Diagnosis present

## 2013-10-04 DIAGNOSIS — Z905 Acquired absence of kidney: Secondary | ICD-10-CM | POA: Insufficient documentation

## 2013-10-04 DIAGNOSIS — Z87440 Personal history of urinary (tract) infections: Secondary | ICD-10-CM | POA: Insufficient documentation

## 2013-10-04 DIAGNOSIS — Z862 Personal history of diseases of the blood and blood-forming organs and certain disorders involving the immune mechanism: Secondary | ICD-10-CM | POA: Insufficient documentation

## 2013-10-04 DIAGNOSIS — F3110 Bipolar disorder, current episode manic without psychotic features, unspecified: Secondary | ICD-10-CM

## 2013-10-04 DIAGNOSIS — IMO0001 Reserved for inherently not codable concepts without codable children: Secondary | ICD-10-CM | POA: Insufficient documentation

## 2013-10-04 DIAGNOSIS — J45901 Unspecified asthma with (acute) exacerbation: Secondary | ICD-10-CM | POA: Insufficient documentation

## 2013-10-04 DIAGNOSIS — Z87442 Personal history of urinary calculi: Secondary | ICD-10-CM | POA: Insufficient documentation

## 2013-10-04 DIAGNOSIS — IMO0002 Reserved for concepts with insufficient information to code with codable children: Secondary | ICD-10-CM | POA: Insufficient documentation

## 2013-10-04 DIAGNOSIS — Z85528 Personal history of other malignant neoplasm of kidney: Secondary | ICD-10-CM | POA: Insufficient documentation

## 2013-10-04 DIAGNOSIS — Z79899 Other long term (current) drug therapy: Secondary | ICD-10-CM | POA: Insufficient documentation

## 2013-10-04 DIAGNOSIS — F411 Generalized anxiety disorder: Secondary | ICD-10-CM | POA: Insufficient documentation

## 2013-10-04 DIAGNOSIS — Z8659 Personal history of other mental and behavioral disorders: Secondary | ICD-10-CM | POA: Insufficient documentation

## 2013-10-04 DIAGNOSIS — F29 Unspecified psychosis not due to a substance or known physiological condition: Secondary | ICD-10-CM | POA: Insufficient documentation

## 2013-10-04 DIAGNOSIS — E785 Hyperlipidemia, unspecified: Secondary | ICD-10-CM | POA: Insufficient documentation

## 2013-10-04 DIAGNOSIS — R1110 Vomiting, unspecified: Secondary | ICD-10-CM | POA: Insufficient documentation

## 2013-10-04 DIAGNOSIS — R011 Cardiac murmur, unspecified: Secondary | ICD-10-CM | POA: Insufficient documentation

## 2013-10-04 DIAGNOSIS — R51 Headache: Secondary | ICD-10-CM | POA: Insufficient documentation

## 2013-10-04 DIAGNOSIS — K219 Gastro-esophageal reflux disease without esophagitis: Secondary | ICD-10-CM | POA: Insufficient documentation

## 2013-10-04 DIAGNOSIS — E669 Obesity, unspecified: Secondary | ICD-10-CM | POA: Insufficient documentation

## 2013-10-04 DIAGNOSIS — F23 Brief psychotic disorder: Secondary | ICD-10-CM

## 2013-10-04 LAB — CBC WITH DIFFERENTIAL/PLATELET
BASOS PCT: 1 % (ref 0–1)
Basophils Absolute: 0 10*3/uL (ref 0.0–0.1)
EOS PCT: 3 % (ref 0–5)
Eosinophils Absolute: 0.2 10*3/uL (ref 0.0–0.7)
HCT: 30.5 % — ABNORMAL LOW (ref 36.0–46.0)
HEMOGLOBIN: 9.9 g/dL — AB (ref 12.0–15.0)
LYMPHS ABS: 1.7 10*3/uL (ref 0.7–4.0)
Lymphocytes Relative: 31 % (ref 12–46)
MCH: 26.8 pg (ref 26.0–34.0)
MCHC: 32.5 g/dL (ref 30.0–36.0)
MCV: 82.7 fL (ref 78.0–100.0)
MONO ABS: 0.5 10*3/uL (ref 0.1–1.0)
MONOS PCT: 9 % (ref 3–12)
NEUTROS PCT: 56 % (ref 43–77)
Neutro Abs: 3.1 10*3/uL (ref 1.7–7.7)
Platelets: 423 10*3/uL — ABNORMAL HIGH (ref 150–400)
RBC: 3.69 MIL/uL — AB (ref 3.87–5.11)
RDW: 14.7 % (ref 11.5–15.5)
WBC: 5.6 10*3/uL (ref 4.0–10.5)

## 2013-10-04 LAB — COMPREHENSIVE METABOLIC PANEL
ALBUMIN: 4.5 g/dL (ref 3.5–5.2)
ALT: 22 U/L (ref 0–35)
AST: 30 U/L (ref 0–37)
Alkaline Phosphatase: 85 U/L (ref 39–117)
BUN: 18 mg/dL (ref 6–23)
CALCIUM: 10.2 mg/dL (ref 8.4–10.5)
CHLORIDE: 104 meq/L (ref 96–112)
CO2: 21 mEq/L (ref 19–32)
Creatinine, Ser: 1.2 mg/dL — ABNORMAL HIGH (ref 0.50–1.10)
GFR calc Af Amer: 53 mL/min — ABNORMAL LOW (ref >90)
GFR calc non Af Amer: 45 mL/min — ABNORMAL LOW (ref >90)
Glucose, Bld: 94 mg/dL (ref 70–99)
Potassium: 3.8 mEq/L (ref 3.7–5.3)
Sodium: 144 mEq/L (ref 137–147)
Total Bilirubin: 0.8 mg/dL (ref 0.3–1.2)
Total Protein: 8.3 g/dL (ref 6.0–8.3)

## 2013-10-04 LAB — ETHANOL

## 2013-10-04 MED ORDER — ONDANSETRON 4 MG PO TBDP: 4.0000 mg | ORAL_TABLET | Freq: Two times a day (BID) | ORAL | Status: DC | PRN

## 2013-10-04 MED ORDER — PANTOPRAZOLE SODIUM 40 MG PO TBEC
40.0000 mg | DELAYED_RELEASE_TABLET | Freq: Every day | ORAL | Status: DC
  Administered 2013-10-04 – 2013-10-05 (×2): 40 mg via ORAL
  Filled 2013-10-04 (×2): qty 1

## 2013-10-04 MED ORDER — ONDANSETRON 4 MG PO TBDP
4.0000 mg | ORAL_TABLET | Freq: Once | ORAL | Status: AC
  Administered 2013-10-04: 4 mg via ORAL
  Filled 2013-10-04: qty 1

## 2013-10-04 MED ORDER — MOMETASONE FURO-FORMOTEROL FUM 200-5 MCG/ACT IN AERO
2.0000 | INHALATION_SPRAY | Freq: Two times a day (BID) | RESPIRATORY_TRACT | Status: DC
  Administered 2013-10-04 – 2013-10-05 (×2): 2 via RESPIRATORY_TRACT
  Filled 2013-10-04: qty 8.8

## 2013-10-04 MED ORDER — QUETIAPINE FUMARATE 50 MG PO TABS
50.0000 mg | ORAL_TABLET | Freq: Every day | ORAL | Status: DC
  Administered 2013-10-04: 50 mg via ORAL
  Filled 2013-10-04: qty 1

## 2013-10-04 MED ORDER — AMLODIPINE BESYLATE 5 MG PO TABS
5.0000 mg | ORAL_TABLET | Freq: Every day | ORAL | Status: DC
  Administered 2013-10-05: 5 mg via ORAL
  Filled 2013-10-04 (×2): qty 1

## 2013-10-04 MED ORDER — POTASSIUM CHLORIDE CRYS ER 20 MEQ PO TBCR
20.0000 meq | EXTENDED_RELEASE_TABLET | Freq: Every day | ORAL | Status: DC
  Administered 2013-10-04 – 2013-10-05 (×2): 20 meq via ORAL
  Filled 2013-10-04 (×2): qty 1

## 2013-10-04 MED ORDER — ALBUTEROL SULFATE HFA 108 (90 BASE) MCG/ACT IN AERS
2.0000 | INHALATION_SPRAY | Freq: Four times a day (QID) | RESPIRATORY_TRACT | Status: DC | PRN
  Filled 2013-10-04: qty 6.7

## 2013-10-04 MED ORDER — AMLODIPINE BESYLATE 5 MG PO TABS
5.0000 mg | ORAL_TABLET | Freq: Once | ORAL | Status: AC
  Administered 2013-10-04: 5 mg via ORAL
  Filled 2013-10-04: qty 1

## 2013-10-04 MED ORDER — SIMVASTATIN 5 MG PO TABS
5.0000 mg | ORAL_TABLET | Freq: Every day | ORAL | Status: DC
  Filled 2013-10-04: qty 1

## 2013-10-04 MED ORDER — LORATADINE 10 MG PO TABS
10.0000 mg | ORAL_TABLET | Freq: Every day | ORAL | Status: DC
  Administered 2013-10-05: 10 mg via ORAL
  Filled 2013-10-04 (×2): qty 1

## 2013-10-04 MED ORDER — CARVEDILOL 25 MG PO TABS
25.0000 mg | ORAL_TABLET | Freq: Two times a day (BID) | ORAL | Status: DC
  Administered 2013-10-05: 25 mg via ORAL
  Filled 2013-10-04 (×3): qty 1

## 2013-10-04 MED ORDER — CYCLOBENZAPRINE HCL 10 MG PO TABS
10.0000 mg | ORAL_TABLET | Freq: Three times a day (TID) | ORAL | Status: DC | PRN
  Administered 2013-10-04 – 2013-10-05 (×2): 10 mg via ORAL
  Filled 2013-10-04 (×2): qty 1

## 2013-10-04 MED ORDER — TRAMADOL HCL 50 MG PO TABS
50.0000 mg | ORAL_TABLET | Freq: Three times a day (TID) | ORAL | Status: DC | PRN
  Administered 2013-10-04 – 2013-10-05 (×2): 50 mg via ORAL
  Filled 2013-10-04 (×2): qty 1

## 2013-10-04 NOTE — ED Notes (Signed)
Pt noted to be manic, wearing oxygen tubing w/ 98%  o2 sats.  RN continuing to monitor.
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
## 2013-10-04 NOTE — ED Notes (Signed)
Pt uses 2 L of O2 and uses a walker.
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
## 2013-10-04 NOTE — ED Provider Notes (Signed)
Medical screening examination/treatment/procedure(s) were performed by non-physician practitioner and as supervising physician I was immediately available for consultation/collaboration.   EKG Interpretation   Date/Time:  Sunday October 04 2013 17:10:34 EDT Ventricular Rate:  103 PR Interval:  171 QRS Duration: 84 QT Interval:  345 QTC Calculation: 452 R Axis:   24 Text Interpretation:  Sinus tachycardia Borderline T abnormalities,  diffuse leads Baseline wander in lead(s) V2 new t wave changes Confirmed  by Rogene Houston  MD, SCOTT [phone]) on 10/04/2013 5:44:06 PM        Tanna Furry, MD 10/04/13 8325

## 2013-10-04 NOTE — ED Notes (Signed)
Pt has been manically obsessed with her medications, have reviewed med list with her multiple times.  Have obtained orders for meds that  MD wants her to receive and given them.  Pt threatening to call a lawyer, calling nurse names, very agitated.  Meds given, decreased stimuli in room, will continue to observe.

## 2013-10-04 NOTE — ED Notes (Signed)
Pt. Became agitated and pushed bedside table up against the wall.

## 2013-10-04 NOTE — ED Notes (Signed)
Urine sample not obtained on patient due to her aggressive behavior.

## 2013-10-04 NOTE — ED Notes (Signed)
Unsuccessfully attempted to obtain blood for labs x2.RN made aware 
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
## 2013-10-05 ENCOUNTER — Inpatient Hospital Stay (HOSPITAL_COMMUNITY)
Admission: AD | Admit: 2013-10-05 | Discharge: 2013-10-08 | DRG: 885 | Disposition: A | Discharge status: Home / Self Care | Payer: Medicare Other | Source: Intra-hospital | Attending: Psychiatry | Admitting: Psychiatry

## 2013-10-05 ENCOUNTER — Encounter (HOSPITAL_COMMUNITY): Payer: Self-pay | Admitting: Psychiatry

## 2013-10-05 ENCOUNTER — Encounter (HOSPITAL_COMMUNITY): Payer: Self-pay

## 2013-10-05 DIAGNOSIS — G4700 Insomnia, unspecified: Secondary | ICD-10-CM | POA: Diagnosis present

## 2013-10-05 DIAGNOSIS — F3110 Bipolar disorder, current episode manic without psychotic features, unspecified: Secondary | ICD-10-CM

## 2013-10-05 DIAGNOSIS — F4310 Post-traumatic stress disorder, unspecified: Secondary | ICD-10-CM | POA: Diagnosis present

## 2013-10-05 DIAGNOSIS — M1990 Unspecified osteoarthritis, unspecified site: Secondary | ICD-10-CM | POA: Diagnosis present

## 2013-10-05 DIAGNOSIS — F312 Bipolar disorder, current episode manic severe with psychotic features: Secondary | ICD-10-CM | POA: Diagnosis present

## 2013-10-05 DIAGNOSIS — F1210 Cannabis abuse, uncomplicated: Secondary | ICD-10-CM | POA: Diagnosis present

## 2013-10-05 DIAGNOSIS — Z823 Family history of stroke: Secondary | ICD-10-CM

## 2013-10-05 DIAGNOSIS — Z8249 Family history of ischemic heart disease and other diseases of the circulatory system: Secondary | ICD-10-CM | POA: Diagnosis not present

## 2013-10-05 DIAGNOSIS — K219 Gastro-esophageal reflux disease without esophagitis: Secondary | ICD-10-CM | POA: Diagnosis present

## 2013-10-05 DIAGNOSIS — Z87442 Personal history of urinary calculi: Secondary | ICD-10-CM

## 2013-10-05 DIAGNOSIS — Z85528 Personal history of other malignant neoplasm of kidney: Secondary | ICD-10-CM | POA: Diagnosis not present

## 2013-10-05 DIAGNOSIS — Z808 Family history of malignant neoplasm of other organs or systems: Secondary | ICD-10-CM

## 2013-10-05 DIAGNOSIS — F411 Generalized anxiety disorder: Secondary | ICD-10-CM | POA: Diagnosis present

## 2013-10-05 DIAGNOSIS — IMO0001 Reserved for inherently not codable concepts without codable children: Secondary | ICD-10-CM | POA: Diagnosis present

## 2013-10-05 DIAGNOSIS — F319 Bipolar disorder, unspecified: Secondary | ICD-10-CM | POA: Diagnosis present

## 2013-10-05 DIAGNOSIS — I428 Other cardiomyopathies: Secondary | ICD-10-CM | POA: Diagnosis present

## 2013-10-05 DIAGNOSIS — E785 Hyperlipidemia, unspecified: Secondary | ICD-10-CM | POA: Diagnosis present

## 2013-10-05 DIAGNOSIS — J45909 Unspecified asthma, uncomplicated: Secondary | ICD-10-CM | POA: Diagnosis present

## 2013-10-05 DIAGNOSIS — I10 Essential (primary) hypertension: Secondary | ICD-10-CM | POA: Diagnosis present

## 2013-10-05 DIAGNOSIS — F22 Delusional disorders: Secondary | ICD-10-CM | POA: Diagnosis present

## 2013-10-05 LAB — RAPID URINE DRUG SCREEN, HOSP PERFORMED
Amphetamines: NOT DETECTED
Barbiturates: NOT DETECTED
Benzodiazepines: NOT DETECTED
COCAINE: NOT DETECTED
Opiates: NOT DETECTED
TETRAHYDROCANNABINOL: POSITIVE — AB

## 2013-10-05 MED ORDER — LORATADINE 10 MG PO TABS
10.0000 mg | ORAL_TABLET | Freq: Every day | ORAL | Status: DC
  Administered 2013-10-06: 10 mg via ORAL
  Filled 2013-10-05 (×4): qty 1

## 2013-10-05 MED ORDER — MOMETASONE FURO-FORMOTEROL FUM 200-5 MCG/ACT IN AERO
2.0000 | INHALATION_SPRAY | Freq: Two times a day (BID) | RESPIRATORY_TRACT | Status: DC
  Administered 2013-10-05 – 2013-10-08 (×6): 2 via RESPIRATORY_TRACT
  Filled 2013-10-05 (×2): qty 8.8

## 2013-10-05 MED ORDER — ALBUTEROL SULFATE HFA 108 (90 BASE) MCG/ACT IN AERS
2.0000 | INHALATION_SPRAY | Freq: Four times a day (QID) | RESPIRATORY_TRACT | Status: DC | PRN
  Administered 2013-10-06: 2 via RESPIRATORY_TRACT
  Filled 2013-10-05: qty 6.7

## 2013-10-05 MED ORDER — QUETIAPINE FUMARATE 50 MG PO TABS
50.0000 mg | ORAL_TABLET | Freq: Every day | ORAL | Status: DC
  Administered 2013-10-05 – 2013-10-07 (×3): 50 mg via ORAL
  Filled 2013-10-05 (×2): qty 1
  Filled 2013-10-05: qty 14
  Filled 2013-10-05 (×3): qty 1

## 2013-10-05 MED ORDER — AMLODIPINE BESYLATE 5 MG PO TABS
5.0000 mg | ORAL_TABLET | Freq: Every day | ORAL | Status: DC
  Administered 2013-10-06 – 2013-10-08 (×3): 5 mg via ORAL
  Filled 2013-10-05 (×6): qty 1

## 2013-10-05 MED ORDER — ALUM & MAG HYDROXIDE-SIMETH 200-200-20 MG/5ML PO SUSP: 30.0000 mL | ORAL | Status: DC | PRN

## 2013-10-05 MED ORDER — CYCLOBENZAPRINE HCL 10 MG PO TABS
10.0000 mg | ORAL_TABLET | Freq: Three times a day (TID) | ORAL | Status: DC | PRN
  Administered 2013-10-06 – 2013-10-07 (×2): 10 mg via ORAL
  Filled 2013-10-05 (×2): qty 2

## 2013-10-05 MED ORDER — ONDANSETRON 4 MG PO TBDP: 4.0000 mg | ORAL_TABLET | Freq: Two times a day (BID) | ORAL | Status: DC | PRN

## 2013-10-05 MED ORDER — TRAMADOL HCL 50 MG PO TABS
50.0000 mg | ORAL_TABLET | Freq: Three times a day (TID) | ORAL | Status: DC | PRN
  Administered 2013-10-05 – 2013-10-07 (×3): 50 mg via ORAL
  Filled 2013-10-05 (×3): qty 1

## 2013-10-05 MED ORDER — CARVEDILOL 25 MG PO TABS
25.0000 mg | ORAL_TABLET | Freq: Two times a day (BID) | ORAL | Status: DC
  Administered 2013-10-05 – 2013-10-08 (×6): 25 mg via ORAL
  Filled 2013-10-05 (×5): qty 1
  Filled 2013-10-05 (×2): qty 2
  Filled 2013-10-05 (×5): qty 1

## 2013-10-05 MED ORDER — MAGNESIUM HYDROXIDE 400 MG/5ML PO SUSP
30.0000 mL | Freq: Every day | ORAL | Status: DC | PRN
  Administered 2013-10-06 – 2013-10-07 (×2): 30 mL via ORAL

## 2013-10-05 MED ORDER — SIMVASTATIN 5 MG PO TABS
5.0000 mg | ORAL_TABLET | Freq: Every day | ORAL | Status: DC
  Administered 2013-10-05 – 2013-10-07 (×3): 5 mg via ORAL
  Filled 2013-10-05 (×6): qty 1

## 2013-10-05 MED ORDER — ALBUTEROL SULFATE (2.5 MG/3ML) 0.083% IN NEBU
2.5000 mg | INHALATION_SOLUTION | RESPIRATORY_TRACT | Status: DC | PRN
  Administered 2013-10-05: 2.5 mg via RESPIRATORY_TRACT
  Filled 2013-10-05: qty 3

## 2013-10-05 MED ORDER — ACETAMINOPHEN 325 MG PO TABS: 650.0000 mg | ORAL_TABLET | Freq: Four times a day (QID) | ORAL | Status: DC | PRN

## 2013-10-05 NOTE — ED Notes (Signed)
Patient walked in hallway.

## 2013-10-05 NOTE — Progress Notes (Signed)
Patient ID: Heidi Young, female   DOB: 03-18-1946, 68 y.o.   MRN: [id number]  Admission Note:  D:68 yr female who presents IVC in no acute distress for the treatment of SI and Depression. Pt appears flat and depressed. Pt was calm and cooperative with admission process. Pt came in stating she could not breath, but continued to talk. Pt was irritated and agitated stating she needed O2. Pt was given her inhaler and she responded positively and was in a better attitude after that. Pt said someone came to her house and did an assessment and they said she needed to go to the hospital and she was IVC'D.  A:Skin was assessed and found to be clear of any abnormal marks . POC and unit policies explained and understanding verbalized. Consents obtained. Food and fluids offered, pt went to cafeteria..  R: Pt had no additional questions or concerns.
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
## 2013-10-05 NOTE — Progress Notes (Signed)
Patient ID: Heidi Young, female   DOB: [birth year], 68 y.o.   MRN: [id number] D: pt. Lying down most of the shift, up ad lib to BR, reports pain at "8" of 10. Pt. Talks about living situation, says she was stung by an bee and that neighbor found snakes under garbage can. Pt. Show me legs edematous reports from bee stings, says are unkempt due to poor lawn service. A: Writer introduced self to client provided emotional support, encouraged her speak to landlord about conditions or consider other affordable housing. Writer administered pain med, Ultram (see MAR). Staff will monitor q72min for safety. R: Pt. Is safe on the unit, did not attend group.

## 2013-10-05 NOTE — Discharge Instructions (Signed)
Patient discharged to behavorial health

## 2013-10-05 NOTE — ED Notes (Addendum)
Pt asked to have a neb treatment for asthma. Informed there was not an order in place for nebulizer. Pt very upset and shows strong concern that medications are not being followed  According to her regimen. MD aware. Awaiting order for neb treatment.

## 2013-10-05 NOTE — Progress Notes (Signed)
Pt accepted to 505-2, patient to be transported under IVC. RN to call report to [facility]. NP aware. CSW received consult upon discharge that patient wanted to speak with CSW regarding home situation and grandson. CSW spoke with lead CSW at [place] regarding csw at [facility] evaluating patient home situation and grandson.   Noreene Larsson [phone]  ED CSW 10/05/2013 1456pm

## 2013-10-05 NOTE — Progress Notes (Signed)
Per ac patient anticipated to have 500 hall bed available later today. Patient accepted by [facility] to Dr. Lamar Benes.   Heidi Young [phone]  ED CSW 10/05/2013 1359pm

## 2013-10-05 NOTE — Progress Notes (Signed)
Pt referred to:  [facility] pending review, left message to f/u on referral from weekend.  [facility] pending reivew.  [facility], pending reivew, sent referral and left message.  [facility], pending reivew sent referral and left message.  Pt referred to [facility] pending review, per [facility] beds may be available tomorrow.   No beds available: [facility].    Noreene Larsson [phone]  ED CSW 10/05/2013 1048am

## 2013-10-05 NOTE — Progress Notes (Signed)
Psychoeducational Group Note  Date:  10/05/2013 Time:  8:00 pm  Group Topic/Focus:  Wrap-Up Group:   The focus of this group is to help patients review their daily goal of treatment and discuss progress on daily workbooks.  Participation Level: Did Not Attend  Participation Quality:  Not Applicable  Affect:  Not Applicable  Cognitive:  Not Applicable  Insight:  Not Applicable  Engagement in Group: Not Applicable  Additional Comments:  The patient did not attend group.   Gennette Pac 10/05/2013, 9:09 PM

## 2013-10-05 NOTE — BH Assessment (Signed)
10/05/13 Per Lavella Hammock at [facility] is full but requested referral to be faxed and will be review this AM after D/C's. Writer contacted [facility] @00 :32, 01:00, 01:35, and 02:30 with no answer writer left a message once. Sande Rives Disposition [facility]

## 2013-10-05 NOTE — ED Notes (Signed)
Patient is resting comfortably. Watching TV

## 2013-10-05 NOTE — Tx Team (Signed)
Initial Interdisciplinary Treatment Plan  PATIENT STRENGTHS: (choose at least two) General fund of knowledge  PATIENT STRESSORS: Health problems Medication change or noncompliance   PROBLEM LIST: Problem List/Patient Goals Date to be addressed Date deferred Reason deferred Estimated date of resolution  Risk for suicide 10/05/13     Medication management 10/05/13                                                DISCHARGE CRITERIA:  Improved stabilization in mood, thinking, and/or behavior  PRELIMINARY DISCHARGE PLAN: Attend PHP/IOP Outpatient therapy  PATIENT/FAMIILY INVOLVEMENT: This treatment plan has been presented to and reviewed with the patient, Heidi Young.  The patient and family have been given the opportunity to ask questions and make suggestions.  Providence Crosby 10/05/2013, 7:15 PM

## 2013-10-06 MED ORDER — LORATADINE 10 MG PO TABS
10.0000 mg | ORAL_TABLET | Freq: Every day | ORAL | Status: DC
  Administered 2013-10-07 – 2013-10-08 (×2): 10 mg via ORAL
  Filled 2013-10-06 (×5): qty 1

## 2013-10-06 MED ORDER — PANTOPRAZOLE SODIUM 40 MG PO TBEC
40.0000 mg | DELAYED_RELEASE_TABLET | Freq: Every day | ORAL | Status: DC
  Administered 2013-10-06 – 2013-10-08 (×3): 40 mg via ORAL
  Filled 2013-10-06 (×6): qty 1

## 2013-10-06 MED ORDER — FIBER PLUS CALCIUM PO CAPS: 8.0000 | ORAL_CAPSULE | Freq: Two times a day (BID) | ORAL | Status: DC

## 2013-10-06 MED ORDER — FUROSEMIDE 80 MG PO TABS
80.0000 mg | ORAL_TABLET | Freq: Every day | ORAL | Status: DC
  Administered 2013-10-06 – 2013-10-08 (×3): 80 mg via ORAL
  Filled 2013-10-06 (×3): qty 1
  Filled 2013-10-06: qty 4
  Filled 2013-10-06 (×2): qty 1

## 2013-10-06 MED ORDER — OMEGA-3-ACID ETHYL ESTERS 1 G PO CAPS
1.0000 g | ORAL_CAPSULE | Freq: Two times a day (BID) | ORAL | Status: DC
  Administered 2013-10-06 – 2013-10-08 (×4): 1 g via ORAL
  Filled 2013-10-06 (×8): qty 1

## 2013-10-06 MED ORDER — TRAMADOL HCL 50 MG PO TABS: 50.0000 mg | ORAL_TABLET | Freq: Three times a day (TID) | ORAL | Status: DC | PRN

## 2013-10-06 MED ORDER — SIMVASTATIN 5 MG PO TABS: 5.0000 mg | ORAL_TABLET | Freq: Every day | ORAL | Status: DC

## 2013-10-06 MED ORDER — OMEGA-3 FATTY ACIDS 1000 MG PO CAPS
1.0000 g | ORAL_CAPSULE | Freq: Two times a day (BID) | ORAL | Status: DC
  Filled 2013-10-06 (×2): qty 1

## 2013-10-06 MED ORDER — CALCIUM POLYCARBOPHIL 625 MG PO TABS
625.0000 mg | ORAL_TABLET | Freq: Two times a day (BID) | ORAL | Status: DC
  Administered 2013-10-06 – 2013-10-08 (×4): 625 mg via ORAL
  Filled 2013-10-06 (×8): qty 1

## 2013-10-06 MED ORDER — FESOTERODINE FUMARATE ER 8 MG PO TB24
8.0000 mg | ORAL_TABLET | Freq: Every day | ORAL | Status: DC
  Administered 2013-10-07 – 2013-10-08 (×2): 8 mg via ORAL
  Filled 2013-10-06 (×4): qty 1

## 2013-10-06 MED ORDER — POTASSIUM CHLORIDE CRYS ER 20 MEQ PO TBCR
40.0000 meq | EXTENDED_RELEASE_TABLET | Freq: Every day | ORAL | Status: DC
  Administered 2013-10-06 – 2013-10-08 (×3): 40 meq via ORAL
  Filled 2013-10-06: qty 2
  Filled 2013-10-06: qty 4
  Filled 2013-10-06 (×4): qty 2

## 2013-10-06 MED ORDER — FOLIC ACID 1 MG PO TABS
1.0000 mg | ORAL_TABLET | Freq: Every day | ORAL | Status: DC
  Administered 2013-10-06 – 2013-10-08 (×3): 1 mg via ORAL
  Filled 2013-10-06 (×6): qty 1

## 2013-10-06 NOTE — BHH Suicide Risk Assessment (Signed)
Suicide Risk Assessment  Admission Assessment     Nursing information obtained from:    Demographic factors:    Current Mental Status:    Loss Factors:    Historical Factors:    Risk Reduction Factors:    Total Time spent with patient: 45 minutes  CLINICAL FACTORS:   Bipolar Disorder:   Mixed State  Psychiatric Specialty Exam:     Blood pressure 135/85, pulse 76, temperature 97.7 F (36.5 C), temperature source Oral, resp. rate 20, height 5\' 6"  (1.676 m), weight 96.616 kg (213 lb).Body mass index is 34.4 kg/(m^2).  General Appearance: Fairly Groomed  Engineer, water::  Fair  Speech:  Clear and Coherent  Volume:  fluctuates  Mood:  Anxious and worried, irritated  Affect:  anxious, worried  Thought Process:  Circumstantial  Orientation:  Full (Time, Place, and Person)  Thought Content:  underlying paranoid ideations  Suicidal Thoughts:  No  Homicidal Thoughts:  No  Memory:  Immediate;   Fair Recent;   Fair Remote;   Fair  Judgement:  Impaired  Insight:  Lacking  Psychomotor Activity:  Restlessness  Concentration:  Fair  Recall:  [employer] of Knowledge:NA  Language: Fair  Akathisia:  No  Handed:    AIMS (if indicated):     Assets:  Social Support  Sleep:  Number of Hours: 6.75   Musculoskeletal: Strength & Muscle Tone: within normal limits Gait & Station: normal Patient leans: N/A  COGNITIVE FEATURES THAT CONTRIBUTE TO RISK:  Closed-mindedness Polarized thinking Thought constriction (tunnel vision)    SUICIDE RISK:   Moderate:  Frequent suicidal ideation with limited intensity, and duration, some specificity in terms of plans, no associated intent, good self-control, limited dysphoria/symptomatology, some risk factors present, and identifiable protective factors, including available and accessible social support.  PLAN OF CARE: Supportive approach/coping skills                              Get collateral information                              Improve reality  testing                              Optimize treatment/response to psychotropics  I certify that inpatient services furnished can reasonably be expected to improve the patient's condition.  Nicholaus Bloom 10/06/2013, 11:48 AM

## 2013-10-06 NOTE — Progress Notes (Signed)
D: Patient denies SI/HI and A/V hallucinations; patient reports sleep is well; reports appetite is good; reports energy level is low ; reports ability to pay attention is good; rates depression as 5/10; rates hopelessness 3/10; patient reports that she has to be out of her house as soon as possible and she maybe considering an assisted living facility; patient was reporting some back pain  A: Monitored q 15 minutes; patient encouraged to attend groups; patient educated about medications; patient given medications per physician orders; patient encouraged to express feelings and/or concerns  R: Patient is animated and pleasant; patient has to be redirected some times because she can get off of the subject but patient mainly talks about her living situation and reports how much it helps her to be surrounded by nature; patient does have some relief of pain but she is using the walker; patient's interaction with staff and peers is appropriate;  patient is taking medications as prescribed and tolerating medications; patient is attending most groups

## 2013-10-06 NOTE — BHH Counselor (Signed)
Adult Comprehensive Assessment  Patient ID: Heidi Young, female   DOB: 1945-08-16, 68 y.o.   MRN: [id number]  Information Source: Information source: Patient  Current Stressors:  Employment / Job issues: On disability/retirement Financial / Lack of resources (include bankruptcy): on fixed income, finances are tight Housing / Lack of housing: states that her house is unsanitary and she is moving out Physical health (include injuries & life threatening diseases): knee replacement, ulcer, asthma, herniated disc, fibromyalgia Substance abuse: marijuana use  Living/Environment/Situation:  Living Arrangements: Alone Living conditions (as described by patient or guardian): Pt lives in [place] alone.  Pt reports the [address] 8 housing she is in now is unsanitary with bugs and plans to move out after d/c.   How long has patient lived in current situation?: 6.5 years What is atmosphere in current home: Other (Comment) (pt states that this is unsanitary)  Family History:  Marital status: Divorced Divorced, when?: 1972 What types of issues is patient dealing with in the relationship?: pt states that ex husband had mental health issues Additional relationship information: N/A Does patient have children?: Yes How many children?: 2 How is patient's relationship with their children?: 2 adult sons, one killed in 30.  Pt reports being close to other son.    Childhood History:  By whom was/is the patient raised?: Both parents Additional childhood history information: Pt reports having a good childhood.  Description of patient's relationship with caregiver when they were a child: Pt reports getting along well with parents growing up.  Patient's description of current relationship with people who raised him/her: Both parents deceased.   Does patient have siblings?: Yes Number of Siblings: 3 Description of patient's current relationship with siblings: Pt reports not being close to 2 sisters and  states that she can't stand them.  Pt reports being close to brother but he is in [place].   Did patient suffer any verbal/emotional/physical/sexual abuse as a child?: No Did patient suffer from severe childhood neglect?: No Has patient ever been sexually abused/assaulted/raped as an adolescent or adult?: No Was the patient ever a victim of a crime or a disaster?: No Witnessed domestic violence?: No Has patient been effected by domestic violence as an adult?: No  Education:  Highest grade of school patient has completed: Associate's Degree in Nursing Currently a student?: No Name of school: N/A Learning disability?: No  Employment/Work Situation:   Employment situation: On disability Why is patient on disability: pt states that she's on retirement but was on disability previously.  Due to medical issues How long has patient been on disability: since 2008 Patient's job has been impacted by current illness: No What is the longest time patient has a held a job?: 10 years Where was the patient employed at that time?: [employer] patient ever been in the [employer]?: No Has patient ever served in Recruitment consultant?: No  Financial Resources:   Museum/gallery curator resources: Insurance claims handler Does patient have a Programmer, applications or guardian?: No  Alcohol/Substance Abuse:   What has been your use of drugs/alcohol within the last 12 months?: Marijuana - 1 joint every 3-4 days for the last If attempted suicide, did drugs/alcohol play a role in this?: No Alcohol/Substance Abuse Treatment Hx: Denies past history If yes, describe treatment: N/A Has alcohol/substance abuse ever caused legal problems?: No  Social Support System:   Patient's Community Support System: Good Describe Community Support System: Pt reports neighbor and friends are supportive. Type of faith/religion: Darrick Meigs How does patient's faith help to cope  with current illness?: prayer, occasional church  attendance  Leisure/Recreation:   Leisure and Hobbies: photography, Web designer  Strengths/Needs:   What things does the patient do well?: art In what areas does patient struggle / problems for patient: Admitted for mania, possible psychosis.  Pt reports   Discharge Plan:   Does patient have access to transportation?: Yes Will patient be returning to same living situation after discharge?: Yes Currently receiving community mental health services: No If no, would patient like referral for services when discharged?: Yes (What county?) [facility]) Does patient have financial barriers related to discharge medications?: No  Summary/Recommendations:     Patient is a 68 year old African American female with a diagnosis of Bipolar Disorder, mixed, current episode manic and Cannabis Use Disorder - Moderate.  Patient lives in [place] alone.  Pt was brought here IVC due to concerns from mobile crisis.  Mobile crisis states that pt's home was disheveled and having possible delusions.  Today, pt is more clear and her thought process is more logical.  Pt states that she doesn't feel she needs to be here still but since she is does feel depressed and is open to getting back on medications. Patient will benefit from crisis stabilization, medication evaluation, group therapy and psycho education in addition to case management for discharge planning.    Remus Hagedorn N Horton. 10/06/2013

## 2013-10-06 NOTE — BHH Group Notes (Signed)
[place] LCSW Group Therapy  10/06/2013 2:06 PM  Type of Therapy:  Group Therapy  Participation Level:  Active  Participation Quality:  Attentive  Affect:  Appropriate  Cognitive:  Alert and Oriented  Insight:  Developing/Improving  Engagement in Therapy:  Engaged  Modes of Intervention:  Discussion, Education and Exploration  Summary of Progress/Problems:  Group today consisted of Mental Health Association speaker discussing his personal story related to recovery with substance abuse and mental health.  Heidi Young attended the session, listened attentively and engaged with speaker.    Heidi Young 10/06/2013, 2:06 PM

## 2013-10-06 NOTE — H&P (Signed)
Psychiatric Admission Assessment Adult  Patient Identification:  Heidi Young Date of Evaluation:  10/06/2013 Chief Complaint:  Selmer Dominion MARIJUANA USE DISORDER UNSPECIFIED History of Present Illness:: Heidi Young is a 68 year old AAF who was brought in to the [facility] by police. She had called mobile crisis who came to her house, seeing the condition of her house as well as her behavior, they called police and IVC'd her.       Upon evaluation in the ED the patient was found to be paranoid and manic. She was accepted to [facility] for further evaluation and treatment.         Upon arrival at the adult unit the patient is pleasant, hyper verbal, circumstantial, and disorganized. She is cooperative and informative when redirected gently.  She states that she has felt unwell for "awhile" and reports feeling sad, has poor sleep, feeling overwhelmed, worried about moving as her house is unlivable.  She notes a recent hospitalization at [facility] late last month in the psychiatric unit.         She notes that her depression is a 5/10 and her anxiety is an 8/10.  She reports that she is not currently on any psychiatric medication because she does not have schizophrenia and is not going to take any schizophrenia medicine. Elements:  Location:  Adult unit. Quality:  Acute. Severity:  moderate to severe. Timing:  unknown. Duration:  unknown. Context:  patient is currently manic and disorganized lives alone, has multiple medical problems.. Associated Signs/Synptoms: Depression Symptoms:  depressed mood, anhedonia, insomnia, psychomotor agitation, difficulty concentrating, (Hypo) Manic Symptoms:  Flight of Ideas, Hallucinations, Impulsivity, Labiality of Mood, Anxiety Symptoms:  Agoraphobia, Excessive Worry, Panic Symptoms, Psychotic Symptoms:  Hallucinations: patient denies this but is clearly responding to internal stimulation PTSD Symptoms: Had a traumatic exposure:  son died in  08/01/1991 Total Time spent with patient: 30 minutes  Psychiatric Specialty Exam: Physical Exam  Psychiatric: Her mood appears anxious. Her affect is labile. Her speech is rapid and/or pressured. She is actively hallucinating. Thought content is paranoid. Thought content is not delusional. Cognition and memory are impaired. She expresses impulsivity and inappropriate judgment. She expresses no homicidal and no suicidal ideation. She expresses no suicidal plans and no homicidal plans. She exhibits abnormal recent memory and abnormal remote memory.  Patient is seen and the chart is reviewed. I agree with the exam completed in the ED with no exceptions at this time.    Review of Systems  Unable to perform ROS: acuity of condition    Blood pressure 135/85, pulse 76, temperature 97.7 F (36.5 C), temperature source Oral, resp. rate 20, height _0  (1.676 m), weight 96.616 kg (213 lb).Body mass index is 34.4 kg/(m^2).  General Appearance: Disheveled  Eye Contact::  Good  Speech:  Clear and Coherent and Pressured  Volume:  Normal  Mood:  expansive  Affect:  Congruent  Thought Process:  Disorganized  Orientation:  NA  Thought Content:  Hallucinations: patient denies, + paranoia  Suicidal Thoughts:  No  Homicidal Thoughts:  No  Memory:  NA  Judgement:  Impaired  Insight:  Shallow  Psychomotor Activity:  Increased  Concentration:  Poor  Recall:  [place] of Knowledge:Good  Language: Good  Akathisia:  No  Handed:  Right  AIMS (if indicated):     Assets:  Communication Skills Desire for Improvement  Sleep:  Number of Hours: 6.75    Musculoskeletal: Strength & Muscle Tone: within normal limits Gait &  Station: uses a walker Patient leans: N/A  Past Psychiatric History: Diagnosis:  MDD   Hospitalizations:  2007 ?[place], 2012 [place], [place]:   denies  Substance Abuse Care:  Self-Mutilation:   Denies   Suicidal Attempts:  denies  Violent Behaviors:   denies   Past Medical History:   Past Medical History  Diagnosis Date  . Chest pain   . Cardiomyopathy   . HTN (hypertension)   . Takotsubo cardiomyopathy   . HLD (hyperlipidemia)   . Asthma   . Fibromyalgia   . DJD (degenerative joint disease)   . Chronic anemia   . Obesity   . Carotid bruit     left  . Psychosis   . Lumbar herniated disc   . UTI (lower urinary tract infection)   . Schizophrenia   . Cancer     renal cell carcinoma  . Heart murmur   . Single kidney   . History of kidney cancer     unknown type. had R kidney removed b/c of CA.   Marland Kitchen Kidney stones   . GERD (gastroesophageal reflux disease)     on rare occas. - uses peptobismol   Allergies:   Allergies  Allergen Reactions  . Bee Venom Anaphylaxis  . Avelox [Moxifloxacin Hydrochloride] Other (See Comments)    Rash / Fever / Upset Stomach  . Nsaids Other (See Comments)    Due to stomach ulcers  . Other     Red ant's    PTA Medications: Prescriptions prior to admission  Medication Sig Dispense Refill  . albuterol (PROVENTIL HFA;VENTOLIN HFA) 108 (90 BASE) MCG/ACT inhaler Inhale 2 puffs into the lungs every 6 (six) hours as needed for wheezing.      Marland Kitchen amLODipine (NORVASC) 5 MG tablet Take 5 mg by mouth daily with breakfast.       . carvedilol (COREG) 25 MG tablet Take 25 mg by mouth 2 (two) times daily with a meal.      . cetirizine (ZYRTEC) 10 MG tablet Take 10 mg by mouth daily.      . cyclobenzaprine (FLEXERIL) 10 MG tablet Take 10 mg by mouth 3 (three) times daily as needed for muscle spasms.      . diclofenac sodium (VOLTAREN) 1 % GEL Apply 2 g topically 4 (four) times daily.      . Digestive Enzymes (PAPAYA AND ENZYMES) CHEW Chew 4 tablets by mouth daily.      Marland Kitchen EPINEPHrine (EPI-PEN) 0.3 mg/0.3 mL SOAJ injection Inject 0.3 mg into the muscle once.      . fesoterodine (TOVIAZ) 8 MG TB24 tablet Take 8 mg by mouth daily with lunch.       . fish oil-omega-3 fatty acids 1000 MG capsule Take 1 g by mouth  2 (two) times daily.      . folic acid (FOLVITE) 1 MG tablet Take 1 mg by mouth daily.      . furosemide (LASIX) 40 MG tablet Take 80 mg by mouth daily.       . mometasone-formoterol (DULERA) 100-5 MCG/ACT AERO Inhale 2 puffs into the lungs 2 (two) times daily.      . Multiple Vitamin (MULTIVITAMIN WITH MINERALS) TABS tablet Take 1 tablet by mouth daily.      Marland Kitchen omeprazole (PRILOSEC) 20 MG capsule Take 20 mg by mouth daily.       . ondansetron (ZOFRAN-ODT) 4 MG disintegrating tablet Take 4 mg by mouth 2 (two) times daily as  needed.      . potassium chloride SA (K-DUR,KLOR-CON) 20 MEQ tablet Take 40 mEq by mouth daily.       . Psyllium-Calcium (FIBER PLUS CALCIUM) CAPS Take 8 capsules by mouth 2 (two) times daily.      . traMADol (ULTRAM) 50 MG tablet Take 50 mg by mouth every 8 (eight) hours as needed for pain.      Marland Kitchen Echinacea-Goldenseal (ECHINACEA COMB/GOLDEN SEAL) CAPS Take 1 capsule by mouth 2 (two) times daily.      . pravastatin (PRAVACHOL) 40 MG tablet Take 40 mg by mouth daily after supper.         Previous Psychotropic Medications:  Medication/Dose   Celexa  Risperdal  Seroquel "worked the best"  Welbutrin  Remeron       Substance Abuse History in the last 12 months:  no Denies but UA is + for THC Consequences of Substance Abuse: NA  Social History:  reports that she has never smoked. She does not have any smokeless tobacco history on file. She reports that she uses illicit drugs (Marijuana) about once per week. She reports that she does not drink alcohol. Additional Social History: Current Place of Residence:   Place of Birth:   Family Members: Marital Status:  Single Children:  Sons: 2 ( one is deceased)  Daughters: Relationships: Education:  Fish farm manager Problems/Performance:  RN Religious Beliefs/Practices: History of Abuse (Emotional/Phsycial/Sexual) Occupational Experiences;  retired Nature conservation officer History:  None. Legal History:     None Hobbies/Interests:  Family History:   Family History  Problem Relation Age of Onset  . Coronary artery disease Mother 40  . Heart failure Mother     congestive  . Stroke Mother   . Brain cancer Father 65  . Hypertension      siblings  . Aortic stenosis      sibling    Results for orders placed during the hospital encounter of 10/04/13 (from the past 72 hour(s))  ETHANOL     Status: None   Collection Time    10/04/13  5:45 PM      Result Value Ref Range   Alcohol, Ethyl (B) <11  0 - 11 mg/dL   Comment:            LOWEST DETECTABLE LIMIT FOR     SERUM ALCOHOL IS 11 mg/dL     FOR MEDICAL PURPOSES ONLY  COMPREHENSIVE METABOLIC PANEL     Status: Abnormal   Collection Time    10/04/13  5:45 PM      Result Value Ref Range   Sodium 144  137 - 147 mEq/L   Potassium 3.8  3.7 - 5.3 mEq/L   Chloride 104  96 - 112 mEq/L   CO2 21  19 - 32 mEq/L   Glucose, Bld 94  70 - 99 mg/dL   BUN 18  6 - 23 mg/dL   Creatinine, Ser 1.20 (*) 0.50 - 1.10 mg/dL   Calcium 10.2  8.4 - 10.5 mg/dL   Total Protein 8.3  6.0 - 8.3 g/dL   Albumin 4.5  3.5 - 5.2 g/dL   AST 30  0 - 37 U/L   ALT 22  0 - 35 U/L   Alkaline Phosphatase 85  39 - 117 U/L   Total Bilirubin 0.8  0.3 - 1.2 mg/dL   GFR calc non Af Amer 45 (*) >90 mL/min   GFR calc Af Amer 53 (*) >90 mL/min   Comment: (NOTE)  The eGFR has been calculated using the CKD EPI equation.     This calculation has not been validated in all clinical situations.     eGFR's persistently <90 mL/min signify possible Chronic Kidney     Disease.  CBC WITH DIFFERENTIAL     Status: Abnormal   Collection Time    10/04/13  5:45 PM      Result Value Ref Range   WBC 5.6  4.0 - 10.5 K/uL   RBC 3.69 (*) 3.87 - 5.11 MIL/uL   Hemoglobin 9.9 (*) 12.0 - 15.0 g/dL   HCT 30.5 (*) 36.0 - 46.0 %   MCV 82.7  78.0 - 100.0 fL   MCH 26.8  26.0 - 34.0 pg   MCHC 32.5  30.0 - 36.0 g/dL   RDW 14.7  11.5 - 15.5 %   Platelets 423 (*) 150 - 400 K/uL   Neutrophils  Relative % 56  43 - 77 %   Neutro Abs 3.1  1.7 - 7.7 K/uL   Lymphocytes Relative 31  12 - 46 %   Lymphs Abs 1.7  0.7 - 4.0 K/uL   Monocytes Relative 9  3 - 12 %   Monocytes Absolute 0.5  0.1 - 1.0 K/uL   Eosinophils Relative 3  0 - 5 %   Eosinophils Absolute 0.2  0.0 - 0.7 K/uL   Basophils Relative 1  0 - 1 %   Basophils Absolute 0.0  0.0 - 0.1 K/uL  URINE RAPID DRUG SCREEN (HOSP PERFORMED)     Status: Abnormal   Collection Time    10/04/13 11:59 PM      Result Value Ref Range   Opiates NONE DETECTED  NONE DETECTED   Cocaine NONE DETECTED  NONE DETECTED   Benzodiazepines NONE DETECTED  NONE DETECTED   Amphetamines NONE DETECTED  NONE DETECTED   Tetrahydrocannabinol POSITIVE (*) NONE DETECTED   Barbiturates NONE DETECTED  NONE DETECTED   Comment:            DRUG SCREEN FOR MEDICAL PURPOSES     ONLY.  IF CONFIRMATION IS NEEDED     FOR ANY PURPOSE, NOTIFY LAB     WITHIN 5 DAYS.                LOWEST DETECTABLE LIMITS     FOR URINE DRUG SCREEN     Drug Class       Cutoff (ng/mL)     Amphetamine      1000     Barbiturate      200     Benzodiazepine   785     Tricyclics       885     Opiates          300     Cocaine          300     THC              50   Psychological Evaluations:  Assessment:   DSM5:  Schizophrenia Disorders:   Obsessive-Compulsive Disorders:   Trauma-Stressor Disorders:  Loss of son 66 Substance/Addictive Disorders:  Cannibus abuse Depressive Disorders:  Schizoaffective disorder vs. Schizophrenia, vs Bipolar disorder most recent episode manic with psychotic features  AXIS I:   Bipolar disorder most recent episode manic with psychotic features AXIS II:  Deferred AXIS III:   Past Medical History  Diagnosis Date  . Chest pain   . Cardiomyopathy   . HTN (hypertension)   .  Takotsubo cardiomyopathy   . HLD (hyperlipidemia)   . Asthma   . Fibromyalgia   . DJD (degenerative joint disease)   . Chronic anemia   . Obesity   . Carotid bruit     left   . Psychosis   . Lumbar herniated disc   . UTI (lower urinary tract infection)   . Schizophrenia   . Cancer     renal cell carcinoma  . Heart murmur   . Single kidney   . History of kidney cancer     unknown type. had R kidney removed b/c of CA.   Marland Kitchen Kidney stones   . GERD (gastroesophageal reflux disease)     on rare occas. - uses peptobismol   AXIS IV:  housing problems AXIS V:  31-40 impairment in reality testing  Treatment Plan/Recommendations:   1. Admit for crisis management and stabilization. 2. Medication management to reduce current symptoms to base line and improve the patient's overall level of functioning. 3. Treat health problems as indicated. 4. Develop treatment plan to decrease risk of relapse upon discharge and to reduce the need for readmission. 5. Psycho-social education regarding relapse prevention and self care. 6. Health care follow up as needed for medical problems. 7. Restart home medications where appropriate.  Treatment Plan Summary: Daily contact with patient to assess and evaluate symptoms and progress in treatment Medication management Current Medications:  Current Facility-Administered Medications  Medication Dose Route Frequency Provider Last Rate Last Dose  . acetaminophen (TYLENOL) tablet 650 mg  650 mg Oral Q6H PRN Waylan Boga, NP      . albuterol (PROVENTIL HFA;VENTOLIN HFA) 108 (90 BASE) MCG/ACT inhaler 2 puff  2 puff Inhalation Q6H PRN Waylan Boga, NP      . alum & mag hydroxide-simeth (MAALOX/MYLANTA) 200-200-20 MG/5ML suspension 30 mL  30 mL Oral Q4H PRN Waylan Boga, NP      . amLODipine (NORVASC) tablet 5 mg  5 mg Oral Q breakfast Waylan Boga, NP   5 mg at 10/06/13 0844  . carvedilol (COREG) tablet 25 mg  25 mg Oral BID WC Waylan Boga, NP   25 mg at 10/06/13 0844  . cyclobenzaprine (FLEXERIL) tablet 10 mg  10 mg Oral TID PRN Waylan Boga, NP   10 mg at 10/06/13 0847  . loratadine (CLARITIN) tablet 10 mg  10 mg Oral Daily Waylan Boga,  NP   10 mg at 10/06/13 0844  . magnesium hydroxide (MILK OF MAGNESIA) suspension 30 mL  30 mL Oral Daily PRN Waylan Boga, NP      . mometasone-formoterol (DULERA) 200-5 MCG/ACT inhaler 2 puff  2 puff Inhalation BID Waylan Boga, NP   2 puff at 10/05/13 2145  . ondansetron (ZOFRAN-ODT) disintegrating tablet 4 mg  4 mg Oral BID PRN Waylan Boga, NP      . QUEtiapine (SEROQUEL) tablet 50 mg  50 mg Oral QHS Waylan Boga, NP   50 mg at 10/05/13 2144  . simvastatin (ZOCOR) tablet 5 mg  5 mg Oral q1800 Waylan Boga, NP   5 mg at 10/05/13 1819  . traMADol (ULTRAM) tablet 50 mg  50 mg Oral Q8H PRN Waylan Boga, NP   50 mg at 10/06/13 7035    Observation Level/Precautions:  Fall risk, routine  Laboratory:  reviewed  Psychotherapy:  groups  Medications:    Will restart Seroquel as she notes this worked the best.  Consultations:  IM due to poor renal function, cardiac issues  Discharge Concerns:  Housing,  access to care, safety, follow up, she is at increased risk for readmission.  Estimated LOS:   5-7 days.  Other:     I certify that inpatient services furnished can reasonably be expected to improve the patient's condition.   Marlane Hatcher. [place] RPAC 12:56 PM 10/06/2013 Personally evaluated the patient, reviewed the physical exam and labs and agree with assessment and plan [name] A. [place], [place]. D.

## 2013-10-06 NOTE — Progress Notes (Signed)
Recreation Therapy Notes  Animal-Assisted Activity/Therapy (AAA/T) Program Checklist/Progress Notes Patient Eligibility Criteria Checklist & Daily Group note for Rec Tx Intervention  Date: 04.21.2015 Time: 2:45pm Location: 9 Film/video editor    AAA/T Program Assumption of Risk Form signed by Patient/ or Parent Legal Guardian yes  Patient is free of allergies or sever asthma yes  Patient reports no fear of animals yes  Patient reports no history of cruelty to animals yes   Patient understands his/her participation is voluntary yes  Patient washes hands before animal contact yes  Patient washes hands after animal contact yes  Behavioral Response: Appropriate  Education: Hand Washing, Appropriate Animal Interaction   Education Outcome: Acknowledges understanding  Clinical Observations/Feedback: Patient interacted appropriately with therapeutic dog team, peers and LRT.   Laureen Ochs Edward Trevino, LRT/CTRS  Lane Hacker 10/06/2013 4:26 PM

## 2013-10-06 NOTE — Progress Notes (Signed)
The focus of this group is to educate the patient on the purpose and policies of crisis stabilization and provide a format to answer questions about their admission.  The group details unit policies and expectations of patients while admitted. Patient did attend group and was engaging.

## 2013-10-06 NOTE — BHH Suicide Risk Assessment (Signed)
[place] INPATIENT: Family/Significant Other Suicide Prevention Education   Suicide Prevention Education:  Education Completed; No one has been identified by the patient as the family member/significant other with whom the patient will be residing, and identified as the person(s) who will aid the patient in the event of a mental health crisis (suicidal ideations/suicide attempt).   Pt did not c/o SI at admission, nor have they endorsed SI during their stay here. SPE not required. SPI pamphlet provided to pt and he was encouraged to share information with his support network, ask questions, and talk about any concerns.   The suicide prevention education provided includes the following:  Suicide risk factors  Suicide prevention and interventions  National Suicide Hotline telephone number  [facility] assessment telephone number  [facility] Emergency Assistance [place] and/or Residential Mobile Crisis Unit telephone number  Regan Lemming, [place] 10/06/2013  10:45 AM

## 2013-10-06 NOTE — Progress Notes (Signed)
Adult Psychoeducational Group Note  Date:  10/06/2013 Time:  11:38 PM  Group Topic/Focus:  Wrap-Up Group:   The focus of this group is to help patients review their daily goal of treatment and discuss progress on daily workbooks.  Participation Level:  Active  Participation Quality:  Appropriate  Affect:  Appropriate  Cognitive:  Appropriate  Insight: Appropriate  Engagement in Group:  Engaged  Modes of Intervention:  Discussion  Additional Comments:  Pt was present for wrap up group. She stated that she learned a lot about psych medications today and that she has been resistant to them for a long time, but she feels like she doesn't need to resist them. She has had a good day because she is now able to walk without her walker. She has not been able to do that since October.    Lisbeth Puller A Geni Skorupski 10/06/2013, 11:38 PM

## 2013-10-07 DIAGNOSIS — F312 Bipolar disorder, current episode manic severe with psychotic features: Principal | ICD-10-CM

## 2013-10-07 DIAGNOSIS — F259 Schizoaffective disorder, unspecified: Secondary | ICD-10-CM

## 2013-10-07 MED ORDER — MAGNESIUM CITRATE PO SOLN
1.0000 | Freq: Once | ORAL | Status: AC
  Administered 2013-10-07: 1 via ORAL

## 2013-10-07 NOTE — BHH Group Notes (Signed)
[facility] LCSW Aftercare Discharge Planning Group Note   10/07/2013 8:45 AM  Participation Quality:  Alert, Appropriate and Oriented  Mood/Affect:  Calm  Depression Rating:  0  Anxiety Rating:  0  Thoughts of Suicide:  Pt denies SI/HI  Will you contract for safety?   Yes  Current AVH:  Pt denies  Plan for Discharge/Comments:  Pt attended discharge planning group and actively participated in group.  CSW provided pt with today's workbook.  Pt reports feeling well today.  Pt discussed understanding the importance of taking her meds now and a plan to do so when she leaves the hospital.  Pt will return home in [place] but plans to move to a better home. Pt can follow up at [place] for medication management once she pays the $50 missed appointment fee.  Pt doesn't want a therapy referral at this time.  No further needs voiced by pt at this time.    Transportation Means: Pt reports access to transportation - a friend will pick pt up  Supports: No supports mentioned at this time  Regan Lemming, LCSW 10/07/2013 10:01 AM

## 2013-10-07 NOTE — Progress Notes (Signed)
Patient ID: Heidi Young, female   DOB: 1945-06-24, 68 y.o.   MRN: [id number] D: Pt. Affect brighter today denies depression, SHI, "only problem I have is constipation, hadn't been in about three days" "I don't need no walker, I'm doing good, when I first came in here I though I needed some oxygen, but I don't "I leaning and depending on the Lord" "I got to get home and go to housing authority, see about getting me somewhere else to live" A: Probation officer provided emotional support, staffed with Frederico Hamman S. PA and received order for Magnesium Citrate for clients c/o constipation. Staff will monitor q35min for safety. R: Pt. Is safe on the unit, writer administered Magnesium Citrate, client reports "pass a little gas".

## 2013-10-07 NOTE — BHH Group Notes (Signed)
[place] LCSW Group Therapy  10/07/2013  1:15 PM   Type of Therapy:  Group Therapy  Participation Level:  Active  Participation Quality:  Attentive, Sharing and Supportive  Affect:  Calm  Cognitive:  Alert and Oriented  Insight:  Developing/Improving and Engaged  Engagement in Therapy:  Developing/Improving and Engaged  Modes of Intervention:  Clarification, Confrontation, Discussion, Education, Exploration, Limit-setting, Orientation, Problem-solving, Rapport Building, Art therapist, Socialization and Support  Summary of Progress/Problems: The topic for group today was emotional regulation.  This group focused on both positive and negative emotion identification and allowed group members to process ways to identify feelings, regulate negative emotions, and find healthy ways to manage internal/external emotions. Group members were asked to reflect on a time when their reaction to an emotion led to a negative outcome and explored how alternative responses using emotion regulation would have benefited them. Group members were also asked to discuss a time when emotion regulation was utilized when a negative emotion was experienced.  Pt immediately began group sharing her frustration with her care, discussing an issue with not being given her oxygen here.  Pt spoke in length about this and was redirected to the group topic.  Pt actively participated and was engaged in group discussion.  At the end of group, pt was very insightful sharing that she realized she didn't need the oxygen as she learned how to manage her anxiety and emotions with out it, through deep breathing and other alternatives.   Regan Lemming, LCSW 10/07/2013  2:25 PM

## 2013-10-07 NOTE — Progress Notes (Signed)
[facility] MD Progress Note  10/07/2013 3:47 PM Heidi Young  MRN:  [id number] Subjective:  Patient states that she feels better after a good night's sleep, states she is eating well, feels more more steady on her feet.  Less upset stomach. States her depression is minimal, her anxiety is decreasing significantly. Is aware that she she doesn't need oxygen. She is very pleased that she is improving. 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 rate 18, height 5\' 6"  (1.676 m), weight 96.616 kg (213 lb).Body mass index is 34.4 kg/(m^2).  General Appearance: Casual  Eye Contact::  Good  Speech:  Clear and Coherent  Volume:  Normal  Mood:  Anxious  Affect:  Congruent  Thought Process:  Goal Directed  Orientation:  Full (Time, Place, and Person)  Thought Content:  WDL  Suicidal Thoughts:  No  Homicidal Thoughts:  No  Memory:  Immediate;   Fair  Judgement:  Impaired  Insight:  Present  Psychomotor Activity:  Normal  Concentration:  Fair  Recall:  [employer] of Knowledge:Fair  Language: Good  Akathisia:  No  Handed:  Right  AIMS (if indicated):     Assets:  Communication Skills Desire for Improvement Housing Resilience  Sleep:  Number of Hours: 6.75   Musculoskeletal: Strength & Muscle Tone: within normal limits Gait & Station: unsteady Patient leans: N/A  Current Medications: Current  Facility-Administered Medications  Medication Dose Route Frequency Provider Last Rate Last Dose  . acetaminophen (TYLENOL) tablet 650 mg  650 mg Oral Q6H PRN Waylan Boga, NP      . albuterol (PROVENTIL HFA;VENTOLIN HFA) 108 (90 BASE) MCG/ACT inhaler 2 puff  2 puff Inhalation Q6H PRN Waylan Boga, NP   2 puff at 10/06/13 1600  . alum & mag hydroxide-simeth (MAALOX/MYLANTA) 200-200-20 MG/5ML suspension 30 mL  30 mL Oral Q4H PRN Waylan Boga, NP      . amLODipine (NORVASC) tablet 5 mg  5 mg Oral Q breakfast Waylan Boga, NP   5 mg at 10/07/13 0854  . carvedilol (COREG) tablet 25 mg  25 mg Oral BID WC Waylan Boga, NP   25 mg at 10/07/13 0855  . cyclobenzaprine (FLEXERIL) tablet 10 mg  10 mg Oral TID PRN Waylan Boga, NP   10 mg at 10/07/13 1526  . fesoterodine (TOVIAZ) tablet 8 mg  8 mg Oral Q lunch Nena Polio, PA-C   8 mg at 10/07/13 1157  . folic acid (FOLVITE) tablet 1 mg  1 mg Oral Daily Nena Polio, PA-C   1 mg at 10/07/13 0855  . furosemide (LASIX) tablet 80 mg  80 mg Oral Daily Nena Polio, PA-C   80 mg at 10/07/13 4315  . loratadine (CLARITIN) tablet 10 mg  10 mg Oral Daily Nena Polio, PA-C   10 mg at 10/07/13 0853  . magnesium hydroxide (MILK OF MAGNESIA) suspension 30 mL  30 mL Oral Daily PRN Waylan Boga, NP   30 mL at 10/07/13 1526  . mometasone-formoterol (  DULERA) 200-5 MCG/ACT inhaler 2 puff  2 puff Inhalation BID Waylan Boga, NP   2 puff at 10/07/13 0856  . omega-3 acid ethyl esters (LOVAZA) capsule 1 g  1 g Oral BID Nena Polio, PA-C   1 g at 10/07/13 0851  . ondansetron (ZOFRAN-ODT) disintegrating tablet 4 mg  4 mg Oral BID PRN Waylan Boga, NP      . pantoprazole (PROTONIX) EC tablet 40 mg  40 mg Oral Daily Nena Polio, PA-C   40 mg at 10/07/13 0855  . polycarbophil (FIBERCON) tablet 625 mg  625 mg Oral BID Durward Parcel, MD   625 mg at 10/07/13 0852  . potassium chloride SA (K-DUR,KLOR-CON) CR tablet 40 mEq  40 mEq Oral Daily Nena Polio, PA-C   40 mEq  at 10/07/13 0856  . QUEtiapine (SEROQUEL) tablet 50 mg  50 mg Oral QHS Waylan Boga, NP   50 mg at 10/06/13 2235  . simvastatin (ZOCOR) tablet 5 mg  5 mg Oral q1800 Waylan Boga, NP   5 mg at 10/06/13 1725  . traMADol (ULTRAM) tablet 50 mg  50 mg Oral Q8H PRN Waylan Boga, NP   50 mg at 10/07/13 1528  . traMADol (ULTRAM) tablet 50 mg  50 mg Oral Q8H PRN Nena Polio, PA-C        Lab Results: No results found for this or any previous visit (from the past 48 hour(s)).  Physical Findings: AIMS: Facial and Oral Movements Muscles of Facial Expression: None, normal Lips and Perioral Area: None, normal Jaw: None, normal Tongue: None, normal,Extremity Movements Upper (arms, wrists, hands, fingers): None, normal Lower (legs, knees, ankles, toes): None, normal, Trunk Movements Neck, shoulders, hips: None, normal, Overall Severity Severity of abnormal movements (highest score from questions above): None, normal Incapacitation due to abnormal movements: None, normal Patient's awareness of abnormal movements (rate only patient's report): No Awareness, Dental Status Current problems with teeth and/or dentures?: Yes Does patient usually wear dentures?: Yes  CIWA:  CIWA-Ar Total: 0 COWS:  COWS Total Score: 7  Treatment Plan Summary: Daily contact with patient to assess and evaluate symptoms and progress in treatment Medication management  Plan: 1. Continue crisis management and stabilization. 2. Medication management to reduce current symptoms to base line and improve the patient's overall level of functioning. 3. Treat health problems as indicated. 4. Develop treatment plan to decrease risk of relapse upon discharge and to reduce the need for readmission. 5. Psycho-social education regarding relapse prevention and self care. 6. Health care follow up as needed for medical problems. 7. Disposition in process.  Medical Decision Making Problem Points:  Established problem, stable/improving  (1) Data Points:  Review or order medicine tests (1)  I certify that inpatient services furnished can reasonably be expected to improve the patient's condition.   Nena Polio 10/07/2013, 3:47 PM Agree with assessment and plan [name] A. Sabra Heck, M.D.

## 2013-10-07 NOTE — Progress Notes (Signed)
D: Pt denies SI/HI/AVH . Pt is pleasant and cooperative. Pt would like to have applesauce/food with her medications due to them being hard to swallow.  A: Pt was offered support and encouragement. Pt was given scheduled medications. Pt was encourage to attend groups. Q 15 minute checks were done for safety.   R:Pt attends groups and interacts well with peers and staff. Pt is taking medication. Pt has no complaints at this time.Pt receptive to treatment and safety maintained on unit.

## 2013-10-07 NOTE — Tx Team (Signed)
Interdisciplinary Treatment Plan Update (Adult)  Date: 10/07/2013  Time Reviewed:  9:45 AM  Progress in Treatment: Attending groups: Yes Participating in groups:  Yes Taking medication as prescribed:  Yes Tolerating medication:  Yes Family/Significant othe contact made: No, N/A Patient understands diagnosis:  Yes Discussing patient identified problems/goals with staff:  Yes Medical problems stabilized or resolved:  Yes Denies suicidal/homicidal ideation: Yes Issues/concerns per patient self-inventory:  Yes Other:  New problem(s) identified: N/A  Discharge Plan or Barriers: Pt can follow up at [facility] for medication management and [facility] if necessary as a back up medication management and therapy.    Reason for Continuation of Hospitalization: Anxiety Depression Medication Stabilization  Comments: N/A  Estimated length of stay: 1 day, d/c tomorrow  For review of initial/current patient goals, please see plan of care.  Attendees: Patient:     Family:     Physician:     Nursing:   Marilynne Halsted, RN 10/07/2013 10:07 AM   Clinical Social Worker:  Regan Lemming, LCSW 10/07/2013 10:07 AM   Other: Nena Polio, [place] 10/07/2013 10:07 AM   Other:  Gala Romney, care coordination 10/07/2013 10:07 AM   Other:  Joette Catching, LCSW 10/07/2013 10:07 AM   Other:  Rosana Fret, RN 10/07/2013 10:07 AM   Other:  Lars Pinks, RN 10/07/2013 10:07 AM   Other: Jules Husbands, RN 10/07/2013 10:07 AM   Other:    Other:    Other:    Other:     Scribe for Treatment Team:   Ane Payment, 10/07/2013 10:07 AM

## 2013-10-07 NOTE — Progress Notes (Signed)
D: Pt denies SI, denies HI, denies AVH.  Pt states will comply with medication regimes upon discharge.  Pt states she is feeling better.      A: Patient given emotional support from RN. Patient encouraged to come to staff with concerns and/or questions. Patient's medication routine continued. Patient's orders and plan of care reviewed.   R: Patient remains appropriate and cooperative. Will continue to monitor patient q15 minutes for safety.  Affect expressive.  Eye contact good.  Mobile without assistance. Marland Kitchen

## 2013-10-07 NOTE — Progress Notes (Signed)
Adult Psychoeducational Group Note  Date:  10/07/2013 Time:  9:22 PM  Group Topic/Focus:  Wrap-Up Group:   The focus of this group is to help patients review their daily goal of treatment and discuss progress on daily workbooks.  Participation Level:  Active  Participation Quality:  Appropriate  Affect:  Appropriate  Cognitive:  Appropriate  Insight: Appropriate  Engagement in Group:  Engaged and Supportive  Modes of Intervention:  Support  Additional Comments:  Pt stated that one positive thing that happened today was that she was able to go outside. And that she is glad that she is physically stronger and that she got a good nights rest last night. Pt stated that overall she has had a positive experience and that she has benefited tremendously from tx.   Chapin Arduini 10/07/2013, 9:22 PM

## 2013-10-08 MED ORDER — ALBUTEROL SULFATE HFA 108 (90 BASE) MCG/ACT IN AERS: 2.0000 | INHALATION_SPRAY | Freq: Four times a day (QID) | RESPIRATORY_TRACT | Status: DC | PRN

## 2013-10-08 MED ORDER — PRAVASTATIN SODIUM 40 MG PO TABS: 40.0000 mg | ORAL_TABLET | Freq: Every day | ORAL | Status: DC

## 2013-10-08 MED ORDER — AMLODIPINE BESYLATE 5 MG PO TABS: 5.0000 mg | ORAL_TABLET | Freq: Every day | ORAL | Status: DC

## 2013-10-08 MED ORDER — FUROSEMIDE 40 MG PO TABS: 80.0000 mg | ORAL_TABLET | Freq: Every day | ORAL | Status: DC

## 2013-10-08 MED ORDER — TRAMADOL HCL 50 MG PO TABS: 50.0000 mg | ORAL_TABLET | Freq: Three times a day (TID) | ORAL | Status: DC | PRN

## 2013-10-08 MED ORDER — MOMETASONE FURO-FORMOTEROL FUM 100-5 MCG/ACT IN AERO: 2.0000 | INHALATION_SPRAY | Freq: Two times a day (BID) | RESPIRATORY_TRACT | Status: DC

## 2013-10-08 MED ORDER — FESOTERODINE FUMARATE ER 8 MG PO TB24: 8.0000 mg | ORAL_TABLET | Freq: Every day | ORAL | Status: DC

## 2013-10-08 MED ORDER — POTASSIUM CHLORIDE CRYS ER 20 MEQ PO TBCR: 40.0000 meq | EXTENDED_RELEASE_TABLET | Freq: Every day | ORAL | Status: DC

## 2013-10-08 MED ORDER — CARVEDILOL 25 MG PO TABS: 25.0000 mg | ORAL_TABLET | Freq: Two times a day (BID) | ORAL | Status: DC

## 2013-10-08 MED ORDER — OMEPRAZOLE 20 MG PO CPDR: 20.0000 mg | DELAYED_RELEASE_CAPSULE | Freq: Every day | ORAL | Status: DC

## 2013-10-08 MED ORDER — QUETIAPINE FUMARATE 50 MG PO TABS: 50.0000 mg | ORAL_TABLET | Freq: Every day | ORAL | Status: DC

## 2013-10-08 NOTE — Progress Notes (Signed)
[facility] Adult Case Management Discharge Plan :  Will you be returning to the same living situation after discharge: Yes,  returning to own home At discharge, do you have transportation home?:Yes,  son will pick pt up  Do you have the ability to pay for your medications:Yes,  provided pt with prescriptions and pt verbalizes ability to afford meds  Release of information consent forms completed and in the chart;  Patient's signature needed at discharge.  Patient to Follow up at: Follow-up Information   Follow up with [facility] [place] for medication management.  Must pay $50 no show fee before they will schedule your next appointment.)    Contact information:   [address], [place] [address] Phone: [phone] Fax: [phone]      Follow up with [facility] ((back up plan) Walk in on this date for hospital discharge appointment. Walk in clinic is Monday - Friday 8 am - 3 pm. They will than schedule you for medication management and therapy appts. )    Contact information:   [address], [place] [address] Phone: [phone] Fax: [phone]      Patient denies SI/HI:   Yes,  denies SI/Hi    Safety Planning and Suicide Prevention discussed:  Yes,  discussed with pt.  N/A to contact family/friend.  See suicide prevention education note.   Heidi Young 10/08/2013, 10:12 AM

## 2013-10-08 NOTE — Progress Notes (Signed)
Adult Psychoeducational Group Note  Date:  10/08/2013 Time:  10:00am  Group Topic/Focus:  Personal Choices and Values:   The focus of this group is to help patients assess and explore the importance of values in their lives, how their values affect their decisions, how they express their values and what opposes their expression.  Participation Level:  Active  Participation Quality:  Appropriate and Attentive  Affect:  Appropriate  Cognitive:  Alert and Appropriate  Insight: Appropriate  Engagement in Group:  Engaged  Modes of Intervention:  Discussion and Education  Additional Comments:  Pt attended and participated in group. Question for discussion was what is your biggest stressor?  Pt stated her stressor was her family.  Gailen Shelter 10/08/2013, 11:15 AM

## 2013-10-08 NOTE — Progress Notes (Signed)
Discharge Note:  Patient discharged home with family member.  Denied SI and HI.  Denied A/V hallucinations.  Denied pain.  Patient stated she received all her belongings, clothing, miscellaneous items, toiletries, prescriptions, medications, pin, sweatshirt, coat, two necklaces and one necklace with rosary.  Suicide prevention information given and discussed with patient who stated she understood and had no questions.  Patient stated she appreciated all assistance received from [facility] staff.

## 2013-10-08 NOTE — Progress Notes (Signed)
D:  Patient's self inventory sheet, patient sleeps well, good appetite, normal energy level, good attention span.  Rated depression and anxiety 2, denied hopeless.  Denied withdrawals.  Denied SI.  Has experienced dizziness and blurred vision in past 24 hours.  Worst pain in past 24 hours #2, zero pain goal.  Plans to take her medications as prescribed, will go for counseling as ordered, will remember to rest enough, will talk to someone as needed, will remember safety precautions, will get up slowly, use walker and/or cane when necessary, will keep all appointments.  Need to get home today if possible to arrange for suitable housing, housing authority not open on weekends.  No problems with medications after discharge. A:  Medications administered per MD orders.  Emotional support and encouragement given patient. R:  Denied SI and HI.  Denied A/V hallucinations.  Contracts for safety.  Will continue to monitor patient for safety with 15 minute checks.  Safety maintained. House infected with bugs.  Needs to bring MD note for emergency housing when discharged.  Will go to all her MD appointments.

## 2013-10-08 NOTE — [facility] Suicide Risk Assessment (Signed)
Suicide Risk Assessment  Discharge Assessment     Demographic Factors:  Age 68 or older  Total Time spent with patient: 45 minutes  Psychiatric Specialty Exam:     Blood pressure 116/79, pulse 98, temperature 97.9 F (36.6 C), temperature source Oral, resp. rate 20, height 5\' 6"  (1.676 m), weight 96.616 kg (213 lb).Body mass index is 34.4 kg/(m^2).  General Appearance: Fairly Groomed  Engineer, water::  Fair  Speech:  Clear and Coherent  Volume:  Normal  Mood:  Euthymic  Affect:  Appropriate  Thought Process:  Coherent and Goal Directed  Orientation:  Full (Time, Place, and Person)  Thought Content:  anticipating what she needs to do once she gets home  Suicidal Thoughts:  No  Homicidal Thoughts:  No  Memory:  Immediate;   Fair Recent;   Fair Remote;   Fair  Judgement:  Fair  Insight:  Present and Shallow  Psychomotor Activity:  Normal  Concentration:  Fair  Recall:  [employer] of Knowledge:NA  Language: Fair  Akathisia:  No  Handed:    AIMS (if indicated):     Assets:  Desire for Improvement  Sleep:  Number of Hours: 5.5    Musculoskeletal: Strength & Muscle Tone: within normal limits Gait & Station: normal Patient leans: N/A   Mental Status Per Nursing Assessment::   On Admission:     Current Mental Status by Physician: In full contact with reality. There are no active suicidal homicidal ideas plans or intent. No evidence of active delusional ideas. Feels that the Seroquel at a smaller dosage is helping better   Loss Factors: Financial problems/change in socioeconomic status  Historical Factors: NA  Risk Reduction Factors:   Positive social support  Continued Clinical Symptoms: Bipolar Disorder with psychotic features   Cognitive Features That Contribute To Risk:  Closed-mindedness Polarized thinking Thought constriction (tunnel vision)    Suicide Risk:  Minimal: No identifiable suicidal ideation.  Patients presenting with no risk factors but with  morbid ruminations; may be classified as minimal risk based on the severity of the depressive symptoms  Discharge Diagnoses:   AXIS I:  Bipolar Disorder with psychotic features AXIS II:  No diagnosis AXIS III:   Past Medical History  Diagnosis Date  . Chest pain   . Cardiomyopathy   . HTN (hypertension)   . Takotsubo cardiomyopathy   . HLD (hyperlipidemia)   . Asthma   . Fibromyalgia   . DJD (degenerative joint disease)   . Chronic anemia   . Obesity   . Carotid bruit     left  . Psychosis   . Lumbar herniated disc   . UTI (lower urinary tract infection)   . Schizophrenia   . Cancer     renal cell carcinoma  . Heart murmur   . Single kidney   . History of kidney cancer     unknown type. had R kidney removed b/c of CA.   Marland Kitchen Kidney stones   . GERD (gastroesophageal reflux disease)     on rare occas. - uses peptobismol   AXIS IV:  other psychosocial or environmental problems AXIS V:  61-70 mild symptoms  Plan Of Care/Follow-up recommendations:  Activity:  as tolerated Diet:  regular Follow up outpatient basis Is patient on multiple antipsychotic therapies at discharge:  No   Has Patient had three or more failed trials of antipsychotic monotherapy by history:  No  Recommended Plan for Multiple Antipsychotic Therapies: NA    Woodroe Chen  [place] 10/08/2013, 1:18 PM

## 2013-10-08 NOTE — Progress Notes (Signed)
The focus of this group is to educate the patient on the purpose and policies of crisis stabilization and provide a format to answer questions about their admission.  The group details unit policies and expectations of patients while admitted.  Patient attended 0900 nurse education orientation group this morning.  Patient listened attentively, appropriate affect, alert, appropiaite insight and engagement.  Today patient will work on 3 goals for discharge.

## 2013-10-08 NOTE — [facility] Group Notes (Signed)
[place] LCSW Group Therapy  10/08/2013   1:15 PM   Type of Therapy:  Group Therapy  Participation Level:  Active  Participation Quality:  Attentive, Sharing and Supportive  Affect: Calm  Cognitive:  Alert and Oriented  Insight:  Developing/Improving and Engaged  Engagement in Therapy:  Developing/Improving and Engaged  Modes of Intervention:  Activity, Clarification, Confrontation, Discussion, Education, Exploration, Limit-setting, Orientation, Problem-solving, Rapport Building, Art therapist, Socialization and Support  Summary of Progress/Problems: Patient was attentive and engaged with speaker from [place].  Patient was attentive to speaker while they shared their story of dealing with mental health and overcoming it.  Patient expressed interest in their programs and services and received information on their agency.  Patient processed ways they can relate to the speaker.     Regan Lemming, LCSW 10/08/2013  2:30 PM

## 2013-10-13 NOTE — Progress Notes (Signed)
Patient Discharge Instructions:  After Visit Summary (AVS):   Faxed to:  10/13/13 Psychiatric Admission Assessment Note:   Faxed to:  10/13/13 Suicide Risk Assessment - Discharge Assessment:   Faxed to:  10/13/13 Faxed/Sent to the Next Level Care provider:  10/13/13 Faxed to [facility] @ [phone] Faxed to [phone] @ [place], 10/13/2013, 2:32 PM

## 2013-10-25 NOTE — Discharge Summary (Signed)
Physician Discharge Summary Note  Patient:  Heidi Young is an 68 y.o., female MRN:  [id number] DOB:  1945/09/16 Patient phone:  [phone] (home)  Patient address:   [address] [place] [address],  Total Time spent with patient: 30 minutes  Date of Admission:  10/05/2013 Date of Discharge: 10/08/2013  Reason for Admission:  Manic episode  Discharge Diagnoses: Active Problems:   Bipolar 1 disorder   Psychiatric Specialty Exam: Physical Exam  Review of Systems  Constitutional: Negative.   HENT: Negative.   Eyes: Negative.   Respiratory: Negative.   Cardiovascular: Negative.   Gastrointestinal: Negative.   Genitourinary: Negative.   Musculoskeletal: Negative.   Skin: Negative.   Neurological: Negative.   Endo/Heme/Allergies: Negative.   Psychiatric/Behavioral: Positive for depression. The patient is nervous/anxious.     Blood pressure 116/79, pulse 98, temperature 97.9 F (36.6 C), temperature source Oral, resp. rate 20, height 5\' 6"  (1.676 m), weight 96.616 kg (213 lb).Body mass index is 34.4 kg/(m^2).   General Appearance: Fairly Groomed  Engineer, water::  Fair  Speech:  Clear and Coherent  Volume:  Normal  Mood:  Euthymic  Affect:  Appropriate  Thought Process:  Coherent and Goal Directed  Orientation:  Full (Time, Place, and Person)  Thought Content:  anticipating what she needs to do once she gets home  Suicidal Thoughts:  No  Homicidal Thoughts:  No  Memory:  Immediate;   Fair Recent;   Fair Remote;   Fair  Judgement:  Fair  Insight:  Present and Shallow  Psychomotor Activity:  Normal  Concentration:  Fair  Recall:  [employer] of Knowledge:NA  Language: Fair  Akathisia:  No  Handed:    AIMS (if indicated):     Assets:  Desire for Improvement  Sleep:  Number of Hours: 5.5    Musculoskeletal: Strength & Muscle Tone: within normal limits Gait & Station: normal Patient leans: N/A  DSM5: DSM5:  Schizophrenia Disorders:   Obsessive-Compulsive Disorders:  Trauma-Stressor Disorders: Loss of son 9  Substance/Addictive Disorders: Cannibus abuse  Depressive Disorders: Schizoaffective disorder vs. Schizophrenia, vs Bipolar disorder most recent episode manic with psychotic features   AXIS I: Bipolar disorder most recent episode manic with psychotic features  AXIS II: Deferred AXIS III:   Past Medical History  Diagnosis Date  . Chest pain   . Cardiomyopathy   . HTN (hypertension)   . Takotsubo cardiomyopathy   . HLD (hyperlipidemia)   . Asthma   . Fibromyalgia   . DJD (degenerative joint disease)   . Chronic anemia   . Obesity   . Carotid bruit     left  . Psychosis   . Lumbar herniated disc   . UTI (lower urinary tract infection)   . Schizophrenia   . Cancer     renal cell carcinoma  . Heart murmur   . Single kidney   . History of kidney cancer     unknown type. had R kidney removed b/c of CA.   Marland Kitchen Kidney stones   . GERD (gastroesophageal reflux disease)     on rare occas. - uses peptobismol   AXIS IV:  other psychosocial or environmental problems and problems related to social environment AXIS V:  61-70 mild symptoms  Level of Care:  OP  Hospital Course:  Heidi Young is a 68 year old AAF who was brought in to the [facility] [employer] by police. She had called mobile crisis who came to her house, seeing the condition  of her house as well as her behavior, they called police and IVC'd her. Upon evaluation in the ED the patient was found to be paranoid and manic. She was accepted to [facility] for further evaluation and treatment.  Upon arrival at the adult unit the patient is pleasant, hyper verbal, circumstantial, and disorganized. She is cooperative and informative when redirected gently. She states that she has felt unwell for "awhile" and reports feeling sad, has poor sleep, feeling overwhelmed, worried about moving as her house is unlivable. She notes a recent hospitalization at [facility] late last  month in the psychiatric unit. She notes that her depression is a 5/10 and her anxiety is an 8/10. She reports that she is not currently on any psychiatric medication because she does not have schizophrenia and is not going to take any schizophrenia medicine.  During Hospitalization: Medications managed, psychoeducation, group and individual therapy. Pt currently denies SI, HI, and Psychosis. At discharge, pt rates anxiety and depression as mild. Pt states that she does have a good supportive home environment and will followup with outpatient treatment at [facility]. Affirms agreement with medication regimen and discharge plan. Denies other physical and psychological concerns at time of discharge.   Consults:  None  Significant Diagnostic Studies:  None  Discharge Vitals:   Blood pressure 116/79, pulse 98, temperature 97.9 F (36.6 C), temperature source Oral, resp. rate 20, height 5\' 6"  (1.676 m), weight 96.616 kg (213 lb). Body mass index is 34.4 kg/(m^2). Lab Results:   No results found for this or any previous visit (from the past 72 hour(s)).  Physical Findings: AIMS: Facial and Oral Movements Muscles of Facial Expression: None, normal Lips and Perioral Area: None, normal Jaw: None, normal Tongue: None, normal,Extremity Movements Upper (arms, wrists, hands, fingers): None, normal Lower (legs, knees, ankles, toes): None, normal, Trunk Movements Neck, shoulders, hips: None, normal, Overall Severity Severity of abnormal movements (highest score from questions above): None, normal Incapacitation due to abnormal movements: None, normal Patient's awareness of abnormal movements (rate only patient's report): No Awareness, Dental Status Current problems with teeth and/or dentures?: No Does patient usually wear dentures?: No  CIWA:  CIWA-Ar Total: 1 COWS:  COWS Total Score: 2  Psychiatric Specialty Exam: See Psychiatric Specialty Exam and Suicide Risk Assessment completed by Attending  Physician prior to discharge.  Discharge destination:  Home  Is patient on multiple antipsychotic therapies at discharge:  No   Has Patient had three or more failed trials of antipsychotic monotherapy by history:  No  Recommended Plan for Multiple Antipsychotic Therapies: NA     Medication List    STOP taking these medications       cetirizine 10 MG tablet  Commonly known as:  ZYRTEC     cyclobenzaprine 10 MG tablet  Commonly known as:  FLEXERIL     diclofenac sodium 1 % Gel  Commonly known as:  VOLTAREN     ECHINACEA COMB/GOLDEN SEAL Caps     EPINEPHrine 0.3 mg/0.3 mL Soaj injection  Commonly known as:  EPI-PEN     FIBER PLUS CALCIUM Caps     fish oil-omega-3 fatty acids 1000 MG capsule     folic acid 1 MG tablet  Commonly known as:  FOLVITE     multivitamin with minerals Tabs tablet     ondansetron 4 MG disintegrating tablet  Commonly known as:  ZOFRAN-ODT     PAPAYA AND ENZYMES Chew      TAKE these medications  Indication   albuterol 108 (90 BASE) MCG/ACT inhaler  Commonly known as:  PROVENTIL HFA;VENTOLIN HFA  Inhale 2 puffs into the lungs every 6 (six) hours as needed for wheezing.   Indication:  Asthma     amLODipine 5 MG tablet  Commonly known as:  NORVASC  Take 1 tablet (5 mg total) by mouth daily with breakfast.   Indication:  High Blood Pressure     carvedilol 25 MG tablet  Commonly known as:  COREG  Take 1 tablet (25 mg total) by mouth 2 (two) times daily with a meal.   Indication:  High Blood Pressure of Unknown Cause     fesoterodine 8 MG Tb24 tablet  Commonly known as:  TOVIAZ  Take 1 tablet (8 mg total) by mouth daily with lunch.   Indication:  Frequent Urination, Urinary Urgency     furosemide 40 MG tablet  Commonly known as:  LASIX  Take 2 tablets (80 mg total) by mouth daily.   Indication:  Edema     mometasone-formoterol 100-5 MCG/ACT Aero  Commonly known as:  DULERA  Inhale 2 puffs into the lungs 2 (two) times daily.    Indication:  Asthma     omeprazole 20 MG capsule  Commonly known as:  PRILOSEC  Take 1 capsule (20 mg total) by mouth daily.   Indication:  GERD     potassium chloride SA 20 MEQ tablet  Commonly known as:  K-DUR,KLOR-CON  Take 2 tablets (40 mEq total) by mouth daily.   Indication:  Low Amount of Potassium in the Blood     pravastatin 40 MG tablet  Commonly known as:  PRAVACHOL  Take 1 tablet (40 mg total) by mouth daily after supper.   Indication:  hyperlipidemia     QUEtiapine 50 MG tablet  Commonly known as:  SEROQUEL  Take 1 tablet (50 mg total) by mouth at bedtime.   Indication:  Trouble Sleeping     traMADol 50 MG tablet  Commonly known as:  ULTRAM  Take 1 tablet (50 mg total) by mouth every 8 (eight) hours as needed.   Indication:  pain           Follow-up Information   Follow up with [facility] [place] for medication management.  Must pay $50 no show fee before they will schedule your next appointment.)    Contact information:   [address], [place] [address] Phone: [phone] Fax: [phone]      Follow up with [facility]. ((back up plan) Walk in on this date for hospital discharge appointment. Walk in clinic is Monday - Friday 8 am - 3 pm. They will than schedule you for medication management and therapy appts. )    Contact information:   [address], [place] [address] Phone: [phone] Fax: [phone]      Follow-up recommendations:  Activity:  As tolerated. Diet:  Heart healthy with low sodium.  Comments:   Take all medications as prescribed. Keep all follow-up appointments as scheduled.  Do not consume alcohol or use illegal drugs while on prescription medications. Report any adverse effects from your medications to your primary care provider promptly.  In the event of recurrent symptoms or worsening symptoms, call 911, a crisis hotline, or go to the nearest emergency department for evaluation.   Total  Discharge Time:  Greater than 30 minutes.  Signed: Benjamine Mola, FNP-BC 10/08/2013, 11:26 AM Personally evaluated the patient and agree with assessment and  plan [name] A. Sabra Heck, M.D.

## 2013-11-13 ENCOUNTER — Encounter (HOSPITAL_COMMUNITY): Payer: Self-pay | Admitting: Emergency Medicine

## 2013-11-13 ENCOUNTER — Emergency Department (HOSPITAL_COMMUNITY)
Admission: EM | Admit: 2013-11-13 | Discharge: 2013-11-13 | Disposition: A | Discharge status: Home / Self Care | Payer: Medicare Other | Attending: Emergency Medicine | Admitting: Emergency Medicine

## 2013-11-13 ENCOUNTER — Emergency Department (HOSPITAL_COMMUNITY): Payer: Medicare Other

## 2013-11-13 DIAGNOSIS — Z8739 Personal history of other diseases of the musculoskeletal system and connective tissue: Secondary | ICD-10-CM | POA: Insufficient documentation

## 2013-11-13 DIAGNOSIS — Z87440 Personal history of urinary (tract) infections: Secondary | ICD-10-CM | POA: Insufficient documentation

## 2013-11-13 DIAGNOSIS — F22 Delusional disorders: Secondary | ICD-10-CM | POA: Insufficient documentation

## 2013-11-13 DIAGNOSIS — Z862 Personal history of diseases of the blood and blood-forming organs and certain disorders involving the immune mechanism: Secondary | ICD-10-CM | POA: Insufficient documentation

## 2013-11-13 DIAGNOSIS — K219 Gastro-esophageal reflux disease without esophagitis: Secondary | ICD-10-CM | POA: Insufficient documentation

## 2013-11-13 DIAGNOSIS — E785 Hyperlipidemia, unspecified: Secondary | ICD-10-CM | POA: Insufficient documentation

## 2013-11-13 DIAGNOSIS — J45909 Unspecified asthma, uncomplicated: Secondary | ICD-10-CM | POA: Insufficient documentation

## 2013-11-13 DIAGNOSIS — F209 Schizophrenia, unspecified: Secondary | ICD-10-CM | POA: Insufficient documentation

## 2013-11-13 DIAGNOSIS — R531 Weakness: Secondary | ICD-10-CM

## 2013-11-13 DIAGNOSIS — Z9889 Other specified postprocedural states: Secondary | ICD-10-CM | POA: Insufficient documentation

## 2013-11-13 DIAGNOSIS — E669 Obesity, unspecified: Secondary | ICD-10-CM | POA: Insufficient documentation

## 2013-11-13 DIAGNOSIS — Z79899 Other long term (current) drug therapy: Secondary | ICD-10-CM | POA: Insufficient documentation

## 2013-11-13 DIAGNOSIS — Z85528 Personal history of other malignant neoplasm of kidney: Secondary | ICD-10-CM | POA: Insufficient documentation

## 2013-11-13 DIAGNOSIS — R1110 Vomiting, unspecified: Secondary | ICD-10-CM | POA: Insufficient documentation

## 2013-11-13 DIAGNOSIS — Z9089 Acquired absence of other organs: Secondary | ICD-10-CM | POA: Insufficient documentation

## 2013-11-13 DIAGNOSIS — M6281 Muscle weakness (generalized): Secondary | ICD-10-CM | POA: Insufficient documentation

## 2013-11-13 DIAGNOSIS — I10 Essential (primary) hypertension: Secondary | ICD-10-CM | POA: Insufficient documentation

## 2013-11-13 DIAGNOSIS — Z87442 Personal history of urinary calculi: Secondary | ICD-10-CM | POA: Insufficient documentation

## 2013-11-13 DIAGNOSIS — R011 Cardiac murmur, unspecified: Secondary | ICD-10-CM | POA: Insufficient documentation

## 2013-11-13 LAB — CBC WITH DIFFERENTIAL/PLATELET
BASOS ABS: 0 10*3/uL (ref 0.0–0.1)
Basophils Relative: 1 % (ref 0–1)
EOS ABS: 0.2 10*3/uL (ref 0.0–0.7)
Eosinophils Relative: 5 % (ref 0–5)
HEMATOCRIT: 36 % (ref 36.0–46.0)
Hemoglobin: 11.4 g/dL — ABNORMAL LOW (ref 12.0–15.0)
LYMPHS ABS: 1.7 10*3/uL (ref 0.7–4.0)
LYMPHS PCT: 35 % (ref 12–46)
MCH: 26.4 pg (ref 26.0–34.0)
MCHC: 31.7 g/dL (ref 30.0–36.0)
MCV: 83.3 fL (ref 78.0–100.0)
MONO ABS: 0.3 10*3/uL (ref 0.1–1.0)
Monocytes Relative: 6 % (ref 3–12)
Neutro Abs: 2.5 10*3/uL (ref 1.7–7.7)
Neutrophils Relative %: 53 % (ref 43–77)
PLATELETS: 326 10*3/uL (ref 150–400)
RBC: 4.32 MIL/uL (ref 3.87–5.11)
RDW: 14.7 % (ref 11.5–15.5)
WBC: 4.7 10*3/uL (ref 4.0–10.5)

## 2013-11-13 LAB — URINALYSIS, ROUTINE W REFLEX MICROSCOPIC
Glucose, UA: NEGATIVE mg/dL
Hgb urine dipstick: NEGATIVE
KETONES UR: 15 mg/dL — AB
LEUKOCYTES UA: NEGATIVE
NITRITE: NEGATIVE
PROTEIN: NEGATIVE mg/dL
Specific Gravity, Urine: 1.027 (ref 1.005–1.030)
UROBILINOGEN UA: 1 mg/dL (ref 0.0–1.0)
pH: 5 (ref 5.0–8.0)

## 2013-11-13 LAB — COMPREHENSIVE METABOLIC PANEL
ALT: 19 U/L (ref 0–35)
AST: 23 U/L (ref 0–37)
Albumin: 4 g/dL (ref 3.5–5.2)
Alkaline Phosphatase: 82 U/L (ref 39–117)
BUN: 19 mg/dL (ref 6–23)
CALCIUM: 10.3 mg/dL (ref 8.4–10.5)
CO2: 21 mEq/L (ref 19–32)
Chloride: 103 mEq/L (ref 96–112)
Creatinine, Ser: 1.2 mg/dL — ABNORMAL HIGH (ref 0.50–1.10)
GFR calc Af Amer: 53 mL/min — ABNORMAL LOW (ref >90)
GFR calc non Af Amer: 45 mL/min — ABNORMAL LOW (ref >90)
GLUCOSE: 136 mg/dL — AB (ref 70–99)
Potassium: 4 mEq/L (ref 3.7–5.3)
SODIUM: 140 meq/L (ref 137–147)
TOTAL PROTEIN: 7.9 g/dL (ref 6.0–8.3)
Total Bilirubin: 0.5 mg/dL (ref 0.3–1.2)

## 2013-11-13 LAB — LIPASE, BLOOD: LIPASE: 31 U/L (ref 11–59)

## 2013-11-13 LAB — I-STAT TROPONIN, ED: Troponin i, poc: 0 ng/mL (ref 0.00–0.08)

## 2013-11-13 MED ORDER — OXYCODONE-ACETAMINOPHEN 5-325 MG PO TABS
1.0000 | ORAL_TABLET | Freq: Once | ORAL | Status: AC
  Administered 2013-11-13: 1 via ORAL
  Filled 2013-11-13: qty 1

## 2013-11-13 MED ORDER — ONDANSETRON HCL 4 MG PO TABS
8.0000 mg | ORAL_TABLET | Freq: Once | ORAL | Status: AC
  Administered 2013-11-13: 8 mg via ORAL
  Filled 2013-11-13: qty 2

## 2013-11-13 MED ORDER — SODIUM CHLORIDE 0.9 % IV BOLUS (SEPSIS)
1000.0000 mL | Freq: Once | INTRAVENOUS | Status: AC
  Administered 2013-11-13: 1000 mL via INTRAVENOUS

## 2013-11-13 NOTE — ED Provider Notes (Signed)
CSN: [id number]     Arrival date & time 11/13/13  1351 History   First MD Initiated Contact with Patient 11/13/13 1827     Chief Complaint  Patient presents with  . Loss of Consciousness  . Emesis     (Consider location/radiation/quality/duration/timing/severity/associated sxs/prior Treatment) HPI Patient presents to the emergency department with multiple complaints.  The patient, states, that she's had episodes of loss of consciousness, over the last several months.  She's also had some intermittent vomiting, and several other various complaints.  The patient, very unclear about a lot of these complaints and states, that she's been involuntarily committed.  The patient continues to talk about things that don't follow along with her complaints.  The patient, states, that she's had no shortness of breath, abdominal pain, dysuria, back pain, neck pain, weakness, dizziness, fever, cough, or rash.  The patient is showing some alteration of normal thought and paranoia, but does not appear floridly psychotic Past Medical History  Diagnosis Date  . Chest pain   . Cardiomyopathy   . HTN (hypertension)   . Takotsubo cardiomyopathy   . HLD (hyperlipidemia)   . Asthma   . Fibromyalgia   . DJD (degenerative joint disease)   . Chronic anemia   . Obesity   . Carotid bruit     left  . Psychosis   . Lumbar herniated disc   . UTI (lower urinary tract infection)   . Schizophrenia   . Cancer     renal cell carcinoma  . Heart murmur   . Single kidney   . History of kidney cancer     unknown type. had R kidney removed b/c of CA.   Marland Kitchen Kidney stones   . GERD (gastroesophageal reflux disease)     on rare occas. - uses peptobismol   Past Surgical History  Procedure Laterality Date  . Nephrectomy  1990's    right, secondary to cyst  . Breast lumpectomy  90's    left, cyst  . Cystectomy  90's    back (left)  . Total knee arthroplasty  2005    right  . Cesarean section  1980    x1  . Cardiac  catheterization  2010    no intervention  . Total knee arthroplasty Left 03/25/2013    Procedure: TOTAL KNEE ARTHROPLASTY;  Surgeon: Alta Corning, MD;  Location: [facility];  Service: Orthopedics;  Laterality: Left;   Family History  Problem Relation Age of Onset  . Coronary artery disease Mother 81  . Heart failure Mother     congestive  . Stroke Mother   . Brain cancer Father 37  . Hypertension      siblings  . Aortic stenosis      sibling   History  Substance Use Topics  . Smoking status: Never Smoker   . Smokeless tobacco: Not on file  . Alcohol Use: No     Comment: rare   OB History   Grav Para Term Preterm Abortions TAB SAB Ect Mult Living                 Review of Systems  All other systems negative except as documented in the HPI. All pertinent positives and negatives as reviewed in the HPI.  Allergies  Bee venom; Other; Avelox; and Nsaids  Home Medications   Prior to Admission medications   Medication Sig Start Date End Date Taking? Authorizing Provider  albuterol (PROVENTIL HFA;VENTOLIN HFA) 108 (90 BASE) MCG/ACT inhaler Inhale  2 puffs into the lungs every 6 (six) hours as needed for wheezing. 10/08/13   Benjamine Mola, FNP  amLODipine (NORVASC) 5 MG tablet Take 1 tablet (5 mg total) by mouth daily with breakfast. 10/08/13   Benjamine Mola, FNP  carvedilol (COREG) 25 MG tablet Take 1 tablet (25 mg total) by mouth 2 (two) times daily with a meal. 10/08/13   Benjamine Mola, FNP  fesoterodine (TOVIAZ) 8 MG TB24 tablet Take 1 tablet (8 mg total) by mouth daily with lunch. 10/08/13   Benjamine Mola, FNP  furosemide (LASIX) 40 MG tablet Take 2 tablets (80 mg total) by mouth daily. 10/08/13   Elyse Jarvis Withrow, FNP  mometasone-formoterol (DULERA) 100-5 MCG/ACT AERO Inhale 2 puffs into the lungs 2 (two) times daily. 10/08/13   Benjamine Mola, FNP  omeprazole (PRILOSEC) 20 MG capsule Take 1 capsule (20 mg total) by mouth daily. 10/08/13   Benjamine Mola, FNP  potassium chloride SA  (K-DUR,KLOR-CON) 20 MEQ tablet Take 2 tablets (40 mEq total) by mouth daily. 10/08/13   Benjamine Mola, FNP  pravastatin (PRAVACHOL) 40 MG tablet Take 1 tablet (40 mg total) by mouth daily after supper. 10/08/13   Benjamine Mola, FNP  QUEtiapine (SEROQUEL) 50 MG tablet Take 1 tablet (50 mg total) by mouth at bedtime. 10/08/13   Benjamine Mola, FNP  traMADol (ULTRAM) 50 MG tablet Take 1 tablet (50 mg total) by mouth every 8 (eight) hours as needed. 10/08/13   Benjamine Mola, FNP  VOLTAREN 1 % GEL  09/21/13   Historical Provider, MD   BP 151/61  Pulse 81  Temp(Src) 98.6 F (37 C) (Oral)  Resp 22  Ht 5\' 7"  (1.702 m)  Wt 205 lb (92.987 kg)  BMI 32.10 kg/m2  SpO2 98% Physical Exam  Nursing note and vitals reviewed. Constitutional: She is oriented to person, place, and time. She appears well-developed and well-nourished. No distress.  HENT:  Head: Normocephalic and atraumatic.  Mouth/Throat: Oropharynx is clear and moist.  Eyes: Pupils are equal, round, and reactive to light.  Neck: Normal range of motion. Neck supple.  Cardiovascular: Normal rate, regular rhythm and normal heart sounds.  Exam reveals no gallop and no friction rub.   No murmur heard. Pulmonary/Chest: Effort normal and breath sounds normal. No respiratory distress.  Abdominal: Soft. Bowel sounds are normal. She exhibits no distension. There is no tenderness.  Neurological: She is alert and oriented to person, place, and time. She exhibits normal muscle tone. Coordination normal.  Skin: Skin is warm and dry.  Psychiatric: She is not agitated and not aggressive. Thought content is paranoid. Thought content is not delusional. She expresses no homicidal and no suicidal ideation. She expresses no suicidal plans and no homicidal plans.    ED Course  Procedures (including critical care time) Labs Review Labs Reviewed  CBC WITH DIFFERENTIAL - Abnormal; Notable for the following:    Hemoglobin 11.4 (*)    All other components within  normal limits  COMPREHENSIVE METABOLIC PANEL - Abnormal; Notable for the following:    Glucose, Bld 136 (*)    Creatinine, Ser 1.20 (*)    GFR calc non Af Amer 45 (*)    GFR calc Af Amer 53 (*)    All other components within normal limits  URINALYSIS, ROUTINE W REFLEX MICROSCOPIC - Abnormal; Notable for the following:    Bilirubin Urine SMALL (*)    Ketones, ur 15 (*)    All  other components within normal limits  LIPASE, BLOOD  I-STAT TROPOININ, ED    Imaging Review Dg Chest 2 View  11/13/2013   CLINICAL DATA:  Left-sided chest pain and shortness of breath.  EXAM: CHEST  2 VIEW  COMPARISON:  Chest radiograph October 04, 2013.  FINDINGS: Similarly elevated right hemidiaphragm, strandy densities left lung base. No pleural effusions or focal consolidations. Cardiomediastinal silhouette is nonsuspicious, mildly calcified aortic knob. Trachea projects midline and there is no pneumothorax. Soft tissue planes and included osseous structures are nonsuspicious.  IMPRESSION: Similarly elevated right hemidiaphragm with left lung base atelectasis.   Electronically Signed   By: Elon Alas   On: 11/13/2013 17:51   The patient will be referred back to her primary care Dr. she has a multitude of complaints which do not seem to be prevailing problems at this time.  Patient's been stable here in the emergency department.  She is able to tolerate oral intake.  Patient is advised to increase her fluid intake at home    Brent [facility], [place] 11/13/13 2134

## 2013-11-13 NOTE — Discharge Instructions (Signed)
Return here as needed.  Followup with your primary care Dr. for recheck.  Slowly increase her fluid intake

## 2013-11-13 NOTE — ED Notes (Signed)
EDP approved pt able to have something to eat.

## 2013-11-13 NOTE — ED Notes (Addendum)
Pt reports multiple complaints including syncope on 5/26, increased SOB, N/V, and progressively worsening weakness x "months" and worsening fibromyalgia pain. Pt is  AO x4, but tearful. Grips equal, neuro intact. Lungs diminished. Reports smoking marijuana with some relief to nausea and weakness.

## 2013-11-13 NOTE — ED Notes (Signed)
Pt apologized for wait. Pt is AO x4. Pt tearful in triage.

## 2013-11-14 NOTE — ED Provider Notes (Signed)
Medical screening examination/treatment/procedure(s) were performed by non-physician practitioner and as supervising physician I was immediately available for consultation/collaboration.   EKG Interpretation None       Ezequiel Essex, MD 11/14/13 [phone]

## 2013-11-25 ENCOUNTER — Ambulatory Visit: Payer: Self-pay | Admitting: Physician Assistant

## 2013-12-15 ENCOUNTER — Encounter (HOSPITAL_COMMUNITY): Payer: Self-pay | Admitting: Emergency Medicine

## 2013-12-15 ENCOUNTER — Emergency Department (INDEPENDENT_AMBULATORY_CARE_PROVIDER_SITE_OTHER)
Admission: EM | Admit: 2013-12-15 | Discharge: 2013-12-15 | Disposition: A | Discharge status: Home / Self Care | Payer: Medicare Other | Source: Home / Self Care | Attending: Family Medicine | Admitting: Family Medicine

## 2013-12-15 DIAGNOSIS — Z658 Other specified problems related to psychosocial circumstances: Secondary | ICD-10-CM

## 2013-12-15 DIAGNOSIS — J45901 Unspecified asthma with (acute) exacerbation: Secondary | ICD-10-CM

## 2013-12-15 DIAGNOSIS — Z659 Problem related to unspecified psychosocial circumstances: Secondary | ICD-10-CM

## 2013-12-15 MED ORDER — PREDNISONE 20 MG PO TABS
ORAL_TABLET | ORAL | Status: AC
  Filled 2013-12-15: qty 2

## 2013-12-15 MED ORDER — PREDNISONE 20 MG PO TABS
40.0000 mg | ORAL_TABLET | Freq: Once | ORAL | Status: AC
  Administered 2013-12-15: 40 mg via ORAL

## 2013-12-15 MED ORDER — PREDNISONE 10 MG PO TABS: 30.0000 mg | ORAL_TABLET | Freq: Every day | ORAL | Status: DC

## 2013-12-15 MED ORDER — IPRATROPIUM-ALBUTEROL 0.5-2.5 (3) MG/3ML IN SOLN
3.0000 mL | Freq: Once | RESPIRATORY_TRACT | Status: AC
  Administered 2013-12-15: 3 mL via RESPIRATORY_TRACT

## 2013-12-15 MED ORDER — IPRATROPIUM-ALBUTEROL 0.5-2.5 (3) MG/3ML IN SOLN
RESPIRATORY_TRACT | Status: AC
  Filled 2013-12-15: qty 3

## 2013-12-15 NOTE — Discharge Instructions (Signed)
Thank you for coming in today. Continue the albuterol inhaler and nebulizer.  Please stay with your son or in a hotel tonight until the power gets turned on again.  Go to the emergency room if worsening.  Continue with the [facility] if possible.  Call or go to the emergency room if you get worse, have trouble breathing, have chest pains, or palpitations.    Asthma, Adult Asthma is a condition of the lungs in which the airways tighten and narrow. Asthma can make it hard to breathe. Asthma cannot be cured, but medicine and lifestyle changes can help control it. Asthma may be started (triggered) by:  Animal skin flakes (dander).  Dust.  Cockroaches.  Pollen.  Mold.  Smoke.  Cleaning products.  Hair sprays or aerosol sprays.  Paint fumes or strong smells.  Cold air, weather changes, and winds.  Crying or laughing hard.  Stress.  Certain medicines or drugs.  Foods, such as dried fruit, potato chips, and sparkling grape juice.  Infections or conditions (colds, flu).  Exercise.  Certain medical conditions or diseases.  Exercise or tiring activities. HOME CARE   Take medicine as told by your doctor.  Use a peak flow meter as told by your doctor. A peak flow meter is a tool that measures how well the lungs are working.  Record and keep track of the peak flow meter's readings.  Understand and use the asthma action plan. An asthma action plan is a written plan for taking care of your asthma and treating your attacks.  To help prevent asthma attacks:  Do not smoke. Stay away from secondhand smoke.  Change your heating and air conditioning filter often.  Limit your use of fireplaces and wood stoves.  Get rid of pests (such as roaches and mice) and their droppings.  Throw away plants if you see mold on them.  Clean your floors. Dust regularly. Use cleaning products that do not smell.  Have someone vacuum when you are not home. Use a vacuum cleaner with a HEPA  filter if possible.  Replace carpet with wood, tile, or vinyl flooring. Carpet can trap animal skin flakes and dust.  Use allergy-proof pillows, mattress covers, and box spring covers.  Wash bed sheets and blankets every week in hot water and dry them in a dryer.  Use blankets that are made of polyester or cotton.  Clean bathrooms and kitchens with bleach. If possible, have someone repaint the walls in these rooms with mold-resistant paint. Keep out of the rooms that are being cleaned and painted.  Wash hands often. GET HELP IF:  You have make a whistling sound when breaking (wheeze), have shortness of breath, or have a cough even if taking medicine to prevent attacks.  The colored mucus you cough up (sputum) is thicker than usual.  The colored mucus you cough up changes from clear or white to yellow, green, gray, or bloody.  You have problems from the medicine you are taking such as:  A rash.  Itching.  Swelling.  Trouble breathing.  You need reliever medicines more than 2-3 times a week.  Your peak flow measurement is still at 50-79% of your personal best after following the action plan for 1 hour. GET HELP RIGHT AWAY IF:   You seem to be worse and are not responding to medicine during an asthma attack.  You are short of breath even at rest.  You get short of breath when doing very little activity.  You have  trouble eating, drinking, or talking.  You have chest pain.  You have a fast heartbeat.  Your lips or fingernails start to turn blue.  You are lightheaded, dizzy, or faint.  Your peak flow is less than 50% of your personal best.  You have a fever or lasting symptoms for more than 2-3 days.  You have a fever and your symptoms suddenly get worse. MAKE SURE YOU:   Understand these instructions.  Will watch your condition.  Will get help right away if you are not doing well or get worse. Document Released: 11/21/2007 Document Revised: 03/25/2013  Document Reviewed: 01/01/2013 [facility] Patient Information 2015 [place], [place]. This information is not intended to replace advice given to you by your health care provider. Make sure you discuss any questions you have with your health care provider.

## 2013-12-15 NOTE — ED Provider Notes (Signed)
Heidi Young is a 68 y.o. female who presents to Urgent Care today for shortness of breath. Patient has a medical history significant for asthma. She was in her normal good state of health today. The power was cut off from her house and she developed wheezing with increased heat. She says that she frequently gets symptoms when she is exposed to hot air. She used her albuterol which did not help much. She notes wheezing and coughing. She denies any fevers or chills nausea vomiting or diarrhea.   Past Medical History  Diagnosis Date  . Chest pain   . Cardiomyopathy   . HTN (hypertension)   . Takotsubo cardiomyopathy   . HLD (hyperlipidemia)   . Asthma   . Fibromyalgia   . DJD (degenerative joint disease)   . Chronic anemia   . Obesity   . Carotid bruit     left  . Psychosis   . Lumbar herniated disc   . UTI (lower urinary tract infection)   . Schizophrenia   . Cancer     renal cell carcinoma  . Heart murmur   . Single kidney   . History of kidney cancer     unknown type. had R kidney removed b/c of CA.   Marland Kitchen Kidney stones   . GERD (gastroesophageal reflux disease)     on rare occas. - uses peptobismol   History  Substance Use Topics  . Smoking status: Never Smoker   . Smokeless tobacco: Not on file  . Alcohol Use: No     Comment: rare   ROS as above Medications: No current facility-administered medications for this encounter.   Current Outpatient Prescriptions  Medication Sig Dispense Refill  . albuterol (PROVENTIL HFA;VENTOLIN HFA) 108 (90 BASE) MCG/ACT inhaler Inhale 2 puffs into the lungs 5 (five) times daily as needed for wheezing or shortness of breath.      Marland Kitchen albuterol (PROVENTIL) (2.5 MG/3ML) 0.083% nebulizer solution Take 2.5 mg by nebulization every 4 (four) hours as needed for wheezing or shortness of breath.      Marland Kitchen amLODipine (NORVASC) 5 MG tablet Take 1 tablet (5 mg total) by mouth daily with breakfast.  30 tablet  0  . carvedilol (COREG) 25 MG tablet Take  1 tablet (25 mg total) by mouth 2 (two) times daily with a meal.  60 tablet  0  . Cholecalciferol (VITAMIN D3) 5000 UNITS CAPS Take 10,000 Units by mouth daily with lunch.      Marland Kitchen Echinacea-Goldenseal (ECHINACEA COMB/GOLDEN SEAL) CAPS Take 1 capsule by mouth daily.      . fesoterodine (TOVIAZ) 8 MG TB24 tablet Take 1 tablet (8 mg total) by mouth daily with lunch.  30 tablet  0  . folic acid (FOLVITE) 1 MG tablet Take 1 mg by mouth daily.      . furosemide (LASIX) 40 MG tablet Take 40 mg by mouth daily.      Marland Kitchen levocetirizine (XYZAL) 5 MG tablet Take 5 mg by mouth every evening.      . mometasone-formoterol (DULERA) 100-5 MCG/ACT AERO Inhale 2 puffs into the lungs 2 (two) times daily.  1 Inhaler  0  . Omega-3 Fatty Acids (FISH OIL) 300 MG CAPS Take 300 mg by mouth 2 (two) times daily.      Marland Kitchen omeprazole (PRILOSEC) 20 MG capsule Take 40 mg by mouth daily.      . Papaya TABS Take 4 tablets by mouth daily with breakfast.      . potassium  chloride SA (K-DUR,KLOR-CON) 20 MEQ tablet Take 2 tablets (40 mEq total) by mouth daily.  30 tablet  0  . pravastatin (PRAVACHOL) 40 MG tablet Take 1 tablet (40 mg total) by mouth daily after supper.  30 tablet  0  . Psyllium (FIBER) 0.52 G CAPS Take 6 capsules by mouth 3 (three) times daily.      . QUEtiapine (SEROQUEL) 50 MG tablet Take 1 tablet (50 mg total) by mouth at bedtime.  30 tablet  0  . diclofenac sodium (VOLTAREN) 1 % GEL Apply 2 g topically every 4 (four) hours as needed (pain).      Marland Kitchen EPINEPHrine (EPIPEN) 0.3 mg/0.3 mL IJ SOAJ injection Inject 0.3 mg into the muscle as needed (allergic reaction).      . Multiple Vitamin (MULTIVITAMIN WITH MINERALS) TABS tablet Take 1 tablet by mouth daily with lunch.      . Ondansetron HCl (ZOFRAN PO) Take 1 tablet by mouth 3 (three) times daily as needed (nausea and vomiting).      . polyethylene glycol (MIRALAX / GLYCOLAX) packet Take 17 g by mouth daily as needed (constipation). Mix in 8 oz liquid and drink      .  predniSONE (DELTASONE) 10 MG tablet Take 3 tablets (30 mg total) by mouth daily.  15 tablet  0  . traMADol (ULTRAM) 50 MG tablet Take 50 mg by mouth 3 (three) times daily as needed (pain).        Exam:  BP 165/83  Pulse 122  Temp(Src) 99.2 F (37.3 C) (Oral)  Resp 18  SpO2 100% Gen: Well NAD HEENT: EOMI,  MMM Lungs:  Unable to complete sentences, CTABL Heart: RRR no MRG Abd: NABS, Soft. NT, ND Exts: Brisk capillary refill, warm and well perfused. Psych: Alert and oriented slightly tearful affect. Thought process is slightly tangential in speech is mildly rapid.   Patient was given a DuoNeb nebulizer treatment and felt much better. Her breathing returned to normal as did her heart rate.  No results found for this or any previous visit (from the past 24 hour(s)). No results found.  Assessment and Plan: 68 y.o. female with asthma exacerbation compounded by anxiety. We spent a great deal of time discussing her medical as well as her social issues. Specifically discussed where she will stay tonight. She will stay in a hotel and follow up with her PCP soon.  Carefully reviewed precautions.   Discussed warning signs or symptoms. Please see discharge instructions. Patient expresses understanding.    Gregor Hams, MD 12/15/13 [phone]

## 2013-12-15 NOTE — ED Notes (Signed)
C/o  SOB.  Hx of Asthma.  Gradually getting worse.  Symptoms present x 1 wk.  No relief with inhaler.

## 2013-12-16 ENCOUNTER — Observation Stay (HOSPITAL_COMMUNITY)
Admission: EM | Admit: 2013-12-16 | Discharge: 2013-12-18 | Disposition: A | Discharge status: Home / Self Care | Payer: Medicare Other | Attending: Internal Medicine | Admitting: Internal Medicine

## 2013-12-16 ENCOUNTER — Encounter (HOSPITAL_COMMUNITY): Payer: Self-pay | Admitting: Emergency Medicine

## 2013-12-16 ENCOUNTER — Emergency Department (HOSPITAL_COMMUNITY): Payer: Medicare Other

## 2013-12-16 DIAGNOSIS — J45909 Unspecified asthma, uncomplicated: Secondary | ICD-10-CM

## 2013-12-16 DIAGNOSIS — R0600 Dyspnea, unspecified: Secondary | ICD-10-CM | POA: Diagnosis present

## 2013-12-16 DIAGNOSIS — E785 Hyperlipidemia, unspecified: Secondary | ICD-10-CM | POA: Diagnosis present

## 2013-12-16 DIAGNOSIS — D649 Anemia, unspecified: Secondary | ICD-10-CM | POA: Insufficient documentation

## 2013-12-16 DIAGNOSIS — Z9981 Dependence on supplemental oxygen: Secondary | ICD-10-CM | POA: Insufficient documentation

## 2013-12-16 DIAGNOSIS — Z905 Acquired absence of kidney: Secondary | ICD-10-CM | POA: Insufficient documentation

## 2013-12-16 DIAGNOSIS — IMO0001 Reserved for inherently not codable concepts without codable children: Secondary | ICD-10-CM | POA: Diagnosis present

## 2013-12-16 DIAGNOSIS — I129 Hypertensive chronic kidney disease with stage 1 through stage 4 chronic kidney disease, or unspecified chronic kidney disease: Secondary | ICD-10-CM | POA: Insufficient documentation

## 2013-12-16 DIAGNOSIS — N183 Chronic kidney disease, stage 3 unspecified: Secondary | ICD-10-CM | POA: Insufficient documentation

## 2013-12-16 DIAGNOSIS — F319 Bipolar disorder, unspecified: Secondary | ICD-10-CM | POA: Diagnosis present

## 2013-12-16 DIAGNOSIS — I5181 Takotsubo syndrome: Secondary | ICD-10-CM | POA: Insufficient documentation

## 2013-12-16 DIAGNOSIS — K219 Gastro-esophageal reflux disease without esophagitis: Secondary | ICD-10-CM | POA: Insufficient documentation

## 2013-12-16 DIAGNOSIS — Z96659 Presence of unspecified artificial knee joint: Secondary | ICD-10-CM | POA: Insufficient documentation

## 2013-12-16 DIAGNOSIS — E669 Obesity, unspecified: Secondary | ICD-10-CM | POA: Insufficient documentation

## 2013-12-16 DIAGNOSIS — N189 Chronic kidney disease, unspecified: Secondary | ICD-10-CM

## 2013-12-16 DIAGNOSIS — Z79899 Other long term (current) drug therapy: Secondary | ICD-10-CM | POA: Insufficient documentation

## 2013-12-16 DIAGNOSIS — R609 Edema, unspecified: Secondary | ICD-10-CM | POA: Insufficient documentation

## 2013-12-16 DIAGNOSIS — J45901 Unspecified asthma with (acute) exacerbation: Secondary | ICD-10-CM | POA: Insufficient documentation

## 2013-12-16 DIAGNOSIS — M79609 Pain in unspecified limb: Secondary | ICD-10-CM | POA: Insufficient documentation

## 2013-12-16 DIAGNOSIS — Z85528 Personal history of other malignant neoplasm of kidney: Secondary | ICD-10-CM | POA: Insufficient documentation

## 2013-12-16 DIAGNOSIS — R0602 Shortness of breath: Principal | ICD-10-CM | POA: Insufficient documentation

## 2013-12-16 DIAGNOSIS — N179 Acute kidney failure, unspecified: Secondary | ICD-10-CM | POA: Diagnosis present

## 2013-12-16 DIAGNOSIS — R079 Chest pain, unspecified: Secondary | ICD-10-CM | POA: Diagnosis present

## 2013-12-16 DIAGNOSIS — R0609 Other forms of dyspnea: Secondary | ICD-10-CM | POA: Insufficient documentation

## 2013-12-16 DIAGNOSIS — R55 Syncope and collapse: Secondary | ICD-10-CM

## 2013-12-16 DIAGNOSIS — R0989 Other specified symptoms and signs involving the circulatory and respiratory systems: Secondary | ICD-10-CM | POA: Insufficient documentation

## 2013-12-16 DIAGNOSIS — J4530 Mild persistent asthma, uncomplicated: Secondary | ICD-10-CM

## 2013-12-16 DIAGNOSIS — I10 Essential (primary) hypertension: Secondary | ICD-10-CM | POA: Diagnosis present

## 2013-12-16 DIAGNOSIS — R0789 Other chest pain: Secondary | ICD-10-CM | POA: Insufficient documentation

## 2013-12-16 DIAGNOSIS — I428 Other cardiomyopathies: Secondary | ICD-10-CM | POA: Diagnosis present

## 2013-12-16 LAB — CBC WITH DIFFERENTIAL/PLATELET
Basophils Absolute: 0.1 10*3/uL (ref 0.0–0.1)
Basophils Relative: 1 % (ref 0–1)
EOS ABS: 0.2 10*3/uL (ref 0.0–0.7)
EOS PCT: 1 % (ref 0–5)
HCT: 34.3 % — ABNORMAL LOW (ref 36.0–46.0)
Hemoglobin: 10.9 g/dL — ABNORMAL LOW (ref 12.0–15.0)
Lymphocytes Relative: 32 % (ref 12–46)
Lymphs Abs: 3.5 10*3/uL (ref 0.7–4.0)
MCH: 26.3 pg (ref 26.0–34.0)
MCHC: 31.8 g/dL (ref 30.0–36.0)
MCV: 82.9 fL (ref 78.0–100.0)
Monocytes Absolute: 0.6 10*3/uL (ref 0.1–1.0)
Monocytes Relative: 5 % (ref 3–12)
Neutro Abs: 6.5 10*3/uL (ref 1.7–7.7)
Neutrophils Relative %: 61 % (ref 43–77)
PLATELETS: 306 10*3/uL (ref 150–400)
RBC: 4.14 MIL/uL (ref 3.87–5.11)
RDW: 14.3 % (ref 11.5–15.5)
WBC: 10.7 10*3/uL — ABNORMAL HIGH (ref 4.0–10.5)

## 2013-12-16 LAB — COMPREHENSIVE METABOLIC PANEL
ALT: 33 U/L (ref 0–35)
AST: 62 U/L — ABNORMAL HIGH (ref 0–37)
Albumin: 4.3 g/dL (ref 3.5–5.2)
Alkaline Phosphatase: 95 U/L (ref 39–117)
Anion gap: 18 — ABNORMAL HIGH (ref 5–15)
BUN: 28 mg/dL — ABNORMAL HIGH (ref 6–23)
CALCIUM: 10.2 mg/dL (ref 8.4–10.5)
CO2: 22 mEq/L (ref 19–32)
Chloride: 103 mEq/L (ref 96–112)
Creatinine, Ser: 1.62 mg/dL — ABNORMAL HIGH (ref 0.50–1.10)
GFR calc Af Amer: 37 mL/min — ABNORMAL LOW (ref >90)
GFR calc non Af Amer: 32 mL/min — ABNORMAL LOW (ref >90)
GLUCOSE: 129 mg/dL — AB (ref 70–99)
Potassium: 3.6 mEq/L — ABNORMAL LOW (ref 3.7–5.3)
Sodium: 143 mEq/L (ref 137–147)
TOTAL PROTEIN: 8.3 g/dL (ref 6.0–8.3)
Total Bilirubin: 0.9 mg/dL (ref 0.3–1.2)

## 2013-12-16 MED ORDER — LORAZEPAM 2 MG/ML IJ SOLN
1.0000 mg | Freq: Once | INTRAMUSCULAR | Status: DC
  Filled 2013-12-16: qty 1

## 2013-12-16 MED ORDER — CARVEDILOL 25 MG PO TABS
25.0000 mg | ORAL_TABLET | Freq: Two times a day (BID) | ORAL | Status: DC
  Administered 2013-12-17 – 2013-12-18 (×3): 25 mg via ORAL
  Filled 2013-12-16 (×5): qty 1

## 2013-12-16 MED ORDER — AMLODIPINE BESYLATE 5 MG PO TABS
5.0000 mg | ORAL_TABLET | Freq: Every day | ORAL | Status: DC
  Administered 2013-12-17 – 2013-12-18 (×2): 5 mg via ORAL
  Filled 2013-12-16 (×3): qty 1

## 2013-12-16 MED ORDER — ASPIRIN EC 81 MG PO TBEC
81.0000 mg | DELAYED_RELEASE_TABLET | Freq: Every day | ORAL | Status: DC
  Administered 2013-12-17 – 2013-12-18 (×2): 81 mg via ORAL
  Filled 2013-12-16 (×2): qty 1

## 2013-12-16 MED ORDER — LORAZEPAM 2 MG/ML IJ SOLN
1.0000 mg | Freq: Once | INTRAMUSCULAR | Status: AC
  Administered 2013-12-16: 1 mg via INTRAVENOUS
  Filled 2013-12-16: qty 1

## 2013-12-16 MED ORDER — HEPARIN SODIUM (PORCINE) 5000 UNIT/ML IJ SOLN
5000.0000 [IU] | Freq: Three times a day (TID) | INTRAMUSCULAR | Status: DC
  Administered 2013-12-17 – 2013-12-18 (×6): 5000 [IU] via SUBCUTANEOUS
  Filled 2013-12-16 (×8): qty 1

## 2013-12-16 MED ORDER — TRAMADOL HCL 50 MG PO TABS
50.0000 mg | ORAL_TABLET | Freq: Three times a day (TID) | ORAL | Status: DC | PRN
  Administered 2013-12-17 (×3): 50 mg via ORAL
  Filled 2013-12-16 (×3): qty 1

## 2013-12-16 MED ORDER — ADULT MULTIVITAMIN W/MINERALS CH
1.0000 | ORAL_TABLET | Freq: Every day | ORAL | Status: DC
  Administered 2013-12-17 – 2013-12-18 (×2): 1 via ORAL
  Filled 2013-12-16 (×2): qty 1

## 2013-12-16 MED ORDER — SODIUM CHLORIDE 0.9 % IJ SOLN
3.0000 mL | Freq: Two times a day (BID) | INTRAMUSCULAR | Status: DC
  Administered 2013-12-17 (×2): 3 mL via INTRAVENOUS

## 2013-12-16 MED ORDER — FOLIC ACID 1 MG PO TABS
1.0000 mg | ORAL_TABLET | Freq: Every day | ORAL | Status: DC
  Administered 2013-12-17 – 2013-12-18 (×2): 1 mg via ORAL
  Filled 2013-12-16 (×2): qty 1

## 2013-12-16 MED ORDER — MOMETASONE FURO-FORMOTEROL FUM 100-5 MCG/ACT IN AERO
2.0000 | INHALATION_SPRAY | Freq: Two times a day (BID) | RESPIRATORY_TRACT | Status: DC
  Administered 2013-12-17 – 2013-12-18 (×3): 2 via RESPIRATORY_TRACT
  Filled 2013-12-16: qty 8.8

## 2013-12-16 MED ORDER — SODIUM CHLORIDE 0.9 % IV SOLN
INTRAVENOUS | Status: DC
  Administered 2013-12-16: 20 mL/h via INTRAVENOUS

## 2013-12-16 MED ORDER — SODIUM CHLORIDE 0.9 % IV SOLN
INTRAVENOUS | Status: DC
  Administered 2013-12-17 – 2013-12-18 (×3): via INTRAVENOUS

## 2013-12-16 MED ORDER — PANTOPRAZOLE SODIUM 40 MG PO TBEC
80.0000 mg | DELAYED_RELEASE_TABLET | Freq: Every day | ORAL | Status: DC
  Administered 2013-12-17 – 2013-12-18 (×2): 80 mg via ORAL
  Filled 2013-12-16 (×2): qty 2

## 2013-12-16 MED ORDER — ALBUTEROL SULFATE (2.5 MG/3ML) 0.083% IN NEBU: 2.5000 mg | INHALATION_SOLUTION | Freq: Four times a day (QID) | RESPIRATORY_TRACT | Status: DC

## 2013-12-16 MED ORDER — ONDANSETRON HCL 4 MG PO TABS
4.0000 mg | ORAL_TABLET | Freq: Four times a day (QID) | ORAL | Status: DC | PRN
  Administered 2013-12-17: 4 mg via ORAL
  Filled 2013-12-16: qty 1

## 2013-12-16 MED ORDER — ACETAMINOPHEN 325 MG PO TABS: 650.0000 mg | ORAL_TABLET | Freq: Four times a day (QID) | ORAL | Status: DC | PRN

## 2013-12-16 MED ORDER — METHYLPREDNISOLONE SODIUM SUCC 125 MG IJ SOLR
125.0000 mg | Freq: Once | INTRAMUSCULAR | Status: AC
  Administered 2013-12-16: 125 mg via INTRAVENOUS
  Filled 2013-12-16: qty 2

## 2013-12-16 MED ORDER — ACETAMINOPHEN 650 MG RE SUPP: 650.0000 mg | Freq: Four times a day (QID) | RECTAL | Status: DC | PRN

## 2013-12-16 MED ORDER — FESOTERODINE FUMARATE ER 8 MG PO TB24
8.0000 mg | ORAL_TABLET | Freq: Every day | ORAL | Status: DC
  Administered 2013-12-17 – 2013-12-18 (×2): 8 mg via ORAL
  Filled 2013-12-16 (×2): qty 1

## 2013-12-16 MED ORDER — POTASSIUM CHLORIDE CRYS ER 20 MEQ PO TBCR
40.0000 meq | EXTENDED_RELEASE_TABLET | Freq: Every day | ORAL | Status: DC
  Administered 2013-12-17 – 2013-12-18 (×2): 40 meq via ORAL
  Filled 2013-12-16 (×2): qty 2

## 2013-12-16 MED ORDER — ATORVASTATIN CALCIUM 10 MG PO TABS
10.0000 mg | ORAL_TABLET | Freq: Every day | ORAL | Status: DC
  Administered 2013-12-17: 10 mg via ORAL
  Filled 2013-12-16 (×2): qty 1

## 2013-12-16 MED ORDER — ONDANSETRON HCL 4 MG/2ML IJ SOLN: 4.0000 mg | Freq: Four times a day (QID) | INTRAMUSCULAR | Status: DC | PRN

## 2013-12-16 MED ORDER — QUETIAPINE FUMARATE 50 MG PO TABS
50.0000 mg | ORAL_TABLET | Freq: Every day | ORAL | Status: DC
  Administered 2013-12-17 (×2): 50 mg via ORAL
  Filled 2013-12-16 (×3): qty 1

## 2013-12-16 MED ORDER — IPRATROPIUM BROMIDE 0.02 % IN SOLN
0.5000 mg | Freq: Once | RESPIRATORY_TRACT | Status: AC
  Administered 2013-12-16: 0.5 mg via RESPIRATORY_TRACT
  Filled 2013-12-16: qty 2.5

## 2013-12-16 MED ORDER — OMEGA-3-ACID ETHYL ESTERS 1 G PO CAPS
1.0000 g | ORAL_CAPSULE | Freq: Every day | ORAL | Status: DC
  Administered 2013-12-17 – 2013-12-18 (×2): 1 g via ORAL
  Filled 2013-12-16 (×2): qty 1

## 2013-12-16 MED ORDER — ALBUTEROL (5 MG/ML) CONTINUOUS INHALATION SOLN
10.0000 mg/h | INHALATION_SOLUTION | Freq: Once | RESPIRATORY_TRACT | Status: AC
  Administered 2013-12-16: 10 mg/h via RESPIRATORY_TRACT
  Filled 2013-12-16: qty 20

## 2013-12-16 NOTE — ED Provider Notes (Addendum)
CSN: [id number]     Arrival date & time 12/16/13  1730 History   First MD Initiated Contact with Patient 12/16/13 1928     Chief Complaint  Patient presents with  . Shortness of Breath     (Consider location/radiation/quality/duration/timing/severity/associated sxs/prior Treatment) Patient is a 68 y.o. female presenting with shortness of breath. The history is provided by the patient.  Shortness of Breath  patient here complaining of worsening asthma which began yesterday. States that she has no power in her home and had decreased he is making her more anxious and had wheezing. Was seen at urgent care for similar symptoms 2 days ago and diagnosed with anxiety as well as possible asthma exacerbation. She denies any fever chills. Cough is nonproductive. No CHF or anginal type symptoms. Has used her home inhaler without relief. Last use was just prior to arrival. Denies any leg pain or swelling. No prior history of DVT or PE. Symptoms are worse when she feels anxious. They are better when she relaxes.  Past Medical History  Diagnosis Date  . Chest pain   . Cardiomyopathy   . HTN (hypertension)   . Takotsubo cardiomyopathy   . HLD (hyperlipidemia)   . Asthma   . Fibromyalgia   . DJD (degenerative joint disease)   . Chronic anemia   . Obesity   . Carotid bruit     left  . Psychosis   . Lumbar herniated disc   . UTI (lower urinary tract infection)   . Schizophrenia   . Cancer     renal cell carcinoma  . Heart murmur   . Single kidney   . History of kidney cancer     unknown type. had R kidney removed b/c of CA.   Marland Kitchen Kidney stones   . GERD (gastroesophageal reflux disease)     on rare occas. - uses peptobismol   Past Surgical History  Procedure Laterality Date  . Nephrectomy  1990's    right, secondary to cyst  . Breast lumpectomy  90's    left, cyst  . Cystectomy  90's    back (left)  . Total knee arthroplasty  2005    right  . Cesarean section  1980    x1  . Cardiac  catheterization  2010    no intervention  . Total knee arthroplasty Left 03/25/2013    Procedure: TOTAL KNEE ARTHROPLASTY;  Surgeon: Alta Corning, MD;  Location: [place];  Service: Orthopedics;  Laterality: Left;   Family History  Problem Relation Age of Onset  . Coronary artery disease Mother 43  . Heart failure Mother     congestive  . Stroke Mother   . Brain cancer Father 32  . Hypertension      siblings  . Aortic stenosis      sibling   History  Substance Use Topics  . Smoking status: Never Smoker   . Smokeless tobacco: Not on file  . Alcohol Use: No     Comment: rare   OB History   Grav Para Term Preterm Abortions TAB SAB Ect Mult Living                 Review of Systems  Respiratory: Positive for shortness of breath.   All other systems reviewed and are negative.     Allergies  Bee venom; Other; Avelox; and Nsaids  Home Medications   Prior to Admission medications   Medication Sig Start Date End Date Taking? Authorizing Provider  albuterol (PROVENTIL HFA;VENTOLIN HFA) 108 (90 BASE) MCG/ACT inhaler Inhale 2 puffs into the lungs 5 (five) times daily as needed for wheezing or shortness of breath.    Historical Provider, MD  albuterol (PROVENTIL) (2.5 MG/3ML) 0.083% nebulizer solution Take 2.5 mg by nebulization every 4 (four) hours as needed for wheezing or shortness of breath.    Historical Provider, MD  amLODipine (NORVASC) 5 MG tablet Take 1 tablet (5 mg total) by mouth daily with breakfast. 10/08/13   Benjamine Mola, FNP  carvedilol (COREG) 25 MG tablet Take 1 tablet (25 mg total) by mouth 2 (two) times daily with a meal. 10/08/13   Benjamine Mola, FNP  Cholecalciferol (VITAMIN D3) 5000 UNITS CAPS Take 10,000 Units by mouth daily with lunch.    Historical Provider, MD  diclofenac sodium (VOLTAREN) 1 % GEL Apply 2 g topically every 4 (four) hours as needed (pain).    Historical Provider, MD  Echinacea-Goldenseal [facility] COMB/GOLDEN SEAL) CAPS Take 1 capsule by  mouth daily.    Historical Provider, MD  EPINEPHrine (EPIPEN) 0.3 mg/0.3 mL IJ SOAJ injection Inject 0.3 mg into the muscle as needed (allergic reaction).    Historical Provider, MD  fesoterodine (TOVIAZ) 8 MG TB24 tablet Take 1 tablet (8 mg total) by mouth daily with lunch. 10/08/13   Benjamine Mola, FNP  folic acid (FOLVITE) 1 MG tablet Take 1 mg by mouth daily.    Historical Provider, MD  furosemide (LASIX) 40 MG tablet Take 40 mg by mouth daily.    Historical Provider, MD  levocetirizine (XYZAL) 5 MG tablet Take 5 mg by mouth every evening.    Historical Provider, MD  mometasone-formoterol (DULERA) 100-5 MCG/ACT AERO Inhale 2 puffs into the lungs 2 (two) times daily. 10/08/13   Benjamine Mola, FNP  Multiple Vitamin (MULTIVITAMIN WITH MINERALS) TABS tablet Take 1 tablet by mouth daily with lunch.    Historical Provider, MD  Omega-3 Fatty Acids (FISH OIL) 300 MG CAPS Take 300 mg by mouth 2 (two) times daily.    Historical Provider, MD  omeprazole (PRILOSEC) 20 MG capsule Take 40 mg by mouth daily.    Historical Provider, MD  Ondansetron HCl (ZOFRAN PO) Take 1 tablet by mouth 3 (three) times daily as needed (nausea and vomiting).    Historical Provider, MD  Papaya TABS Take 4 tablets by mouth daily with breakfast.    Historical Provider, MD  polyethylene glycol (MIRALAX / GLYCOLAX) packet Take 17 g by mouth daily as needed (constipation). Mix in 8 oz liquid and drink    Historical Provider, MD  potassium chloride SA (K-DUR,KLOR-CON) 20 MEQ tablet Take 2 tablets (40 mEq total) by mouth daily. 10/08/13   Benjamine Mola, FNP  pravastatin (PRAVACHOL) 40 MG tablet Take 1 tablet (40 mg total) by mouth daily after supper. 10/08/13   Benjamine Mola, FNP  predniSONE (DELTASONE) 10 MG tablet Take 3 tablets (30 mg total) by mouth daily. 12/15/13   Gregor Hams, MD  Psyllium (FIBER) 0.52 G CAPS Take 6 capsules by mouth 3 (three) times daily.    Historical Provider, MD  QUEtiapine (SEROQUEL) 50 MG tablet Take 1  tablet (50 mg total) by mouth at bedtime. 10/08/13   Benjamine Mola, FNP  traMADol (ULTRAM) 50 MG tablet Take 50 mg by mouth 3 (three) times daily as needed (pain).    Historical Provider, MD   BP 122/81  Pulse 111  Temp(Src) 98 F (36.7 C) (Oral)  Resp 18  SpO2 99% Physical Exam  Nursing note and vitals reviewed. Constitutional: She is oriented to person, place, and time. She appears well-developed and well-nourished.  Non-toxic appearance. No distress.  HENT:  Head: Normocephalic and atraumatic.  Eyes: Conjunctivae, EOM and lids are normal. Pupils are equal, round, and reactive to light.  Neck: Normal range of motion. Neck supple. No tracheal deviation present. No mass present.  Cardiovascular: Normal rate, regular rhythm and normal heart sounds.  Exam reveals no gallop.   No murmur heard. Pulmonary/Chest: Effort normal. No stridor. No respiratory distress. She has decreased breath sounds. She has no wheezes. She has no rhonchi. She has no rales.  Abdominal: Soft. Normal appearance and bowel sounds are normal. She exhibits no distension. There is no tenderness. There is no rebound and no CVA tenderness.  Musculoskeletal: Normal range of motion. She exhibits no edema and no tenderness.  Neurological: She is alert and oriented to person, place, and time. She has normal strength. No cranial nerve deficit or sensory deficit. GCS eye subscore is 4. GCS verbal subscore is 5. GCS motor subscore is 6.  Skin: Skin is warm and dry. No abrasion and no rash noted.  Psychiatric: Her speech is normal and behavior is normal. Her mood appears anxious.    ED Course  Procedures (including critical care time) Labs Review Labs Reviewed  CBC WITH DIFFERENTIAL  COMPREHENSIVE METABOLIC PANEL    Imaging Review Dg Chest 2 View (if Patient Has Fever And/or Copd)  12/16/2013   CLINICAL DATA:  Shortness of breath, asthma  EXAM: CHEST  2 VIEW  COMPARISON:  11/13/2013  FINDINGS: Cardiomediastinal silhouette is  stable. Elevation of the right hemidiaphragm again noted. No acute infiltrate or pulmonary edema. Mild degenerative changes mid thoracic spine.  IMPRESSION: No active cardiopulmonary disease.   Electronically Signed   By: Lahoma Crocker M.D.   On: 12/16/2013 18:13     EKG Interpretation None      MDM   Final diagnoses:  None   Patient given albuterol and feels better. She also notes increased anxiety and was given Ativan. No concern for ACS or PE. Will prescribe patient course of prednisone  10:28 PM Patient waited here in the hallway and pulse oximetry decreased to the high 80s. Will be admitted for observation.     Leota Jacobsen, MD 12/16/13 2207  Leota Jacobsen, MD 12/16/13 2229

## 2013-12-16 NOTE — ED Notes (Addendum)
Per EMS, Pt, from home, c/o chronic generalized pain and SOB starting yesterday.  EMS reports that the Pt's power was shut off yesterday and she was unable to administer breathing treatment.  NAD noted.  Pt speaking in full sentences and ambulating w/o difficulty.  Pt was seen at Urgent Care yesterday for same.  Hx of asthma.

## 2013-12-16 NOTE — H&P (Addendum)
History and Physical  Shalva Rozycki RXV:[id number] DOB: 11-Mar-1946 DOA: 12/16/2013   PCP: Philis Fendt, MD   Chief Complaint: sob  HPI:  68 year old female with a history of asthma, Takatsubos cardiomyopathy, fibromyalgia, hypertension, bipolar disorder with psychotic features presents with two-day history of worsening shortness of breath. The patient states that the power to her home has been turned off for the past 2 days, and she has not had any air conditioning. She feels this has made her breathing worse than usual. She went to urgent care on 12/15/2013. After nebulizer treatments, she felt much better and was discharged home. However, today the patient was unable to use her nebulizer at home because of lack of electricity. As a result her shortness of breath but worse again. EMS was activated and she was brought to the emergency department. In the emergency department, the patient's breathing was improved, but according to EDP, Dr. Lacretia Leigh, the patient's oxygen saturation dropped into the high 80s with ambulation. As a result, he wanted the patient to be admitted for observation. She denies any fevers, chills, nausea, vomiting, diarrhea, hemoptysis patient complains of a cough with white sputum production.  Additional history revealed that the patient has had 2 syncopal episodes over the last 10 days. She states that this occurs usually with positional change as she gets up from a seated to standing position. She denies any biting of her tongue, bowel or bladder incontinence. She states that the syncope was just a short period of time lasting 15-20 seconds. She does complain of intermittent chest discomfort without any exacerbating or alleviating factors. She has had occasional dizziness without any visual disturbance, or focal extremity weakness.  In the emergency department, the patient was given Solu-Medrol 125 mg, Ativan 1 mg IV, continuous albuterol nebulizer as well as  Atrovent nebulizer. Although the patient's breathing is somewhat better, she remained short of breath and desaturated into the high 80s with ambulation. As a result, admission was requested. The patient   received 1 L normal saline in the emergency department. CBC were unremarkable. AST was 62, ALT 33, phosphatase 95 total bilirubin 0.9. Serum creatinine was 1.62. Assessment/Plan: Syncope -Appears to be orthostatic/volume depletion in nature according to patient's clinical history -Check orthostatics -Due to the patient's history Takatsubo's cardiomyopathy-->echo -IV fluids -Place on telemetry Atypical chest pain -Cycle troponins -Obtain EKG Lower extremity edema and pain -Venous duplex rule out DVT Acute on chronic renal failure (CKD stage 3) -Baseline creatinine 1.0-1.2 -Serum creatinine 1.62 on the day of admission -IVF Hypertension -Continue amlodipine and carvedilol Asthma exacerbation -At the time of my examination, the patient had no wheezing and oxygen saturation was 100% on room air -Discontinue steroids and monitor -Ambulatory pulse oximetry prior to discharge Bipolar disorder with psychotic features -Continue Seroquel Cannibis use -UDS     Past Medical History  Diagnosis Date  . Chest pain   . Cardiomyopathy   . HTN (hypertension)   . Takotsubo cardiomyopathy   . HLD (hyperlipidemia)   . Asthma   . Fibromyalgia   . DJD (degenerative joint disease)   . Chronic anemia   . Obesity   . Carotid bruit     left  . Psychosis   . Lumbar herniated disc   . UTI (lower urinary tract infection)   . Schizophrenia   . Cancer     renal cell carcinoma  . Heart murmur   . Single kidney   . History of kidney cancer  unknown type. had R kidney removed b/c of CA.   Marland Kitchen Kidney stones   . GERD (gastroesophageal reflux disease)     on rare occas. - uses peptobismol   Past Surgical History  Procedure Laterality Date  . Nephrectomy  1990's    right, secondary to cyst    . Breast lumpectomy  90's    left, cyst  . Cystectomy  90's    back (left)  . Total knee arthroplasty  2005    right  . Cesarean section  1980    x1  . Cardiac catheterization  2010    no intervention  . Total knee arthroplasty Left 03/25/2013    Procedure: TOTAL KNEE ARTHROPLASTY;  Surgeon: Alta Corning, MD;  Location: [facility];  Service: Orthopedics;  Laterality: Left;   Social History:  reports that she has never smoked. She does not have any smokeless tobacco history on file. She reports that she uses illicit drugs (Marijuana) about once per week. She reports that she does not drink alcohol.   Family History  Problem Relation Age of Onset  . Coronary artery disease Mother 62  . Heart failure Mother     congestive  . Stroke Mother   . Brain cancer Father 7  . Hypertension      siblings  . Aortic stenosis      sibling     Allergies  Allergen Reactions  . Bee Venom Anaphylaxis  . Other Anaphylaxis    Red ant's   . Avelox [Moxifloxacin Hydrochloride] Rash and Other (See Comments)    Fever / Upset Stomach  . Nsaids Other (See Comments)    Due to stomach ulcers      Prior to Admission medications   Medication Sig Start Date End Date Taking? Authorizing Provider  albuterol (PROVENTIL HFA;VENTOLIN HFA) 108 (90 BASE) MCG/ACT inhaler Inhale 2 puffs into the lungs 5 (five) times daily as needed for wheezing or shortness of breath.    Historical Provider, MD  albuterol (PROVENTIL) (2.5 MG/3ML) 0.083% nebulizer solution Take 2.5 mg by nebulization every 4 (four) hours as needed for wheezing or shortness of breath.    Historical Provider, MD  amLODipine (NORVASC) 5 MG tablet Take 1 tablet (5 mg total) by mouth daily with breakfast. 10/08/13   Benjamine Mola, FNP  carvedilol (COREG) 25 MG tablet Take 1 tablet (25 mg total) by mouth 2 (two) times daily with a meal. 10/08/13   Benjamine Mola, FNP  Cholecalciferol (VITAMIN D3) 5000 UNITS CAPS Take 10,000 Units by mouth daily with  lunch.    Historical Provider, MD  diclofenac sodium (VOLTAREN) 1 % GEL Apply 2 g topically every 4 (four) hours as needed (pain).    Historical Provider, MD  Echinacea-Goldenseal [facility] COMB/GOLDEN SEAL) CAPS Take 1 capsule by mouth daily.    Historical Provider, MD  EPINEPHrine (EPIPEN) 0.3 mg/0.3 mL IJ SOAJ injection Inject 0.3 mg into the muscle as needed (allergic reaction).    Historical Provider, MD  fesoterodine (TOVIAZ) 8 MG TB24 tablet Take 1 tablet (8 mg total) by mouth daily with lunch. 10/08/13   Benjamine Mola, FNP  folic acid (FOLVITE) 1 MG tablet Take 1 mg by mouth daily.    Historical Provider, MD  furosemide (LASIX) 40 MG tablet Take 40 mg by mouth daily.    Historical Provider, MD  levocetirizine (XYZAL) 5 MG tablet Take 5 mg by mouth every evening.    Historical Provider, MD  mometasone-formoterol (DULERA) 100-5 MCG/ACT  AERO Inhale 2 puffs into the lungs 2 (two) times daily. 10/08/13   Benjamine Mola, FNP  Multiple Vitamin (MULTIVITAMIN WITH MINERALS) TABS tablet Take 1 tablet by mouth daily with lunch.    Historical Provider, MD  Omega-3 Fatty Acids (FISH OIL) 300 MG CAPS Take 300 mg by mouth 2 (two) times daily.    Historical Provider, MD  omeprazole (PRILOSEC) 20 MG capsule Take 40 mg by mouth daily.    Historical Provider, MD  Ondansetron HCl (ZOFRAN PO) Take 1 tablet by mouth 3 (three) times daily as needed (nausea and vomiting).    Historical Provider, MD  Papaya TABS Take 4 tablets by mouth daily with breakfast.    Historical Provider, MD  polyethylene glycol (MIRALAX / GLYCOLAX) packet Take 17 g by mouth daily as needed (constipation). Mix in 8 oz liquid and drink    Historical Provider, MD  potassium chloride SA (K-DUR,KLOR-CON) 20 MEQ tablet Take 2 tablets (40 mEq total) by mouth daily. 10/08/13   Benjamine Mola, FNP  pravastatin (PRAVACHOL) 40 MG tablet Take 1 tablet (40 mg total) by mouth daily after supper. 10/08/13   Benjamine Mola, FNP  predniSONE (DELTASONE) 10 MG  tablet Take 3 tablets (30 mg total) by mouth daily. 12/15/13   Gregor Hams, MD  Psyllium (FIBER) 0.52 G CAPS Take 6 capsules by mouth 3 (three) times daily.    Historical Provider, MD  QUEtiapine (SEROQUEL) 50 MG tablet Take 1 tablet (50 mg total) by mouth at bedtime. 10/08/13   Benjamine Mola, FNP  traMADol (ULTRAM) 50 MG tablet Take 50 mg by mouth 3 (three) times daily as needed (pain).    Historical Provider, MD    Review of Systems:  Constitutional:  No weight loss, night sweats, Fevers, chills, fatigue.  Head&Eyes: No headache.  No vision loss.  No eye pain or scotoma ENT:  No Difficulty swallowing,Tooth/dental problems,Sore throat,  No ear ache, post nasal drip,  Cardio-vascular:  No  Orthopnea, PND, swelling in lower extremities,  dizziness, palpitations  GI:  No  abdominal pain, nausea, vomiting, diarrhea, loss of appetite, hematochezia, melena, heartburn, indigestion, Resp:  No coughing up of blood. + cough with white sputum, +DOE Skin:  no rash or lesions.  GU:  no dysuria,  No flank pain.  Musculoskeletal:  No joint pain or swelling. No decreased range of motion Psych:  No change in mood or affect. Neurologic: No headache, no dysesthesia, no focal weakness, no vision loss.  Physical Exam: Filed Vitals:   12/16/13 1730 12/16/13 1735 12/16/13 2034  BP:  122/81   Pulse:  111   Temp:  98 F (36.7 C)   TempSrc:  Oral   Resp:  18   SpO2: 100% 99% 98%   General:  A&O x 3, NAD, nontoxic, pleasant/cooperative Head/Eye: No conjunctival hemorrhage, no icterus, /AT, No nystagmus ENT:  No icterus,  No thrush,no pharyngeal exudate Neck:  No masses, no lymphadenpathy, no bruits CV:  RRR, no rub, no gallop, no S3 Lung:  CTAB, good air movement, no wheeze, no rhonchi Abdomen: soft/NT, +BS, nondistended, no peritoneal signs Ext: No cyanosis, No rashes, No petechiae, No lymphangitis, 2+LE edema Neuro: CNII-XII intact, strength 4/5 in bilateral upper and lower extremities,  no dysmetria  Labs on Admission:  Basic Metabolic Panel:  Recent Labs Lab 12/16/13 1937  NA 143  K 3.6*  CL 103  CO2 22  GLUCOSE 129*  BUN 28*  CREATININE 1.62*  CALCIUM 10.2  Liver Function Tests:  Recent Labs Lab 12/16/13 1937  AST 62*  ALT 33  ALKPHOS 95  BILITOT 0.9  PROT 8.3  ALBUMIN 4.3   No results found for this basename: LIPASE, AMYLASE,  in the last 168 hours No results found for this basename: AMMONIA,  in the last 168 hours CBC:  Recent Labs Lab 12/16/13 1937  WBC 10.7*  NEUTROABS 6.5  HGB 10.9*  HCT 34.3*  MCV 82.9  PLT 306   Cardiac Enzymes: No results found for this basename: CKTOTAL, CKMB, CKMBINDEX, TROPONINI,  in the last 168 hours BNP: No components found with this basename: POCBNP,  CBG: No results found for this basename: GLUCAP,  in the last 168 hours  Radiological Exams on Admission: Dg Chest 2 View (if Patient Has Fever And/or Copd)  12/16/2013   CLINICAL DATA:  Shortness of breath, asthma  EXAM: CHEST  2 VIEW  COMPARISON:  11/13/2013  FINDINGS: Cardiomediastinal silhouette is stable. Elevation of the right hemidiaphragm again noted. No acute infiltrate or pulmonary edema. Mild degenerative changes mid thoracic spine.  IMPRESSION: No active cardiopulmonary disease.   Electronically Signed   By: Lahoma Crocker M.D.   On: 12/16/2013 18:13    EKG: Independently reviewed. pending    Time spent:60 minutes Code Status:   FULL Family Communication:   No Family at bedside   Maheen Cwikla, DO  [facility] Pager [phone]  If 7PM-7AM, please contact night-coverage [email or web address] Password [email or web address] 12/16/2013, 10:35 PM

## 2013-12-16 NOTE — ED Notes (Signed)
Respiratory called to start breathing treatment.

## 2013-12-17 ENCOUNTER — Ambulatory Visit (HOSPITAL_COMMUNITY): Payer: Medicare Other

## 2013-12-17 DIAGNOSIS — I10 Essential (primary) hypertension: Secondary | ICD-10-CM

## 2013-12-17 DIAGNOSIS — M7989 Other specified soft tissue disorders: Secondary | ICD-10-CM

## 2013-12-17 DIAGNOSIS — R55 Syncope and collapse: Secondary | ICD-10-CM

## 2013-12-17 DIAGNOSIS — R0600 Dyspnea, unspecified: Secondary | ICD-10-CM | POA: Diagnosis present

## 2013-12-17 DIAGNOSIS — I059 Rheumatic mitral valve disease, unspecified: Secondary | ICD-10-CM

## 2013-12-17 DIAGNOSIS — F319 Bipolar disorder, unspecified: Secondary | ICD-10-CM

## 2013-12-17 DIAGNOSIS — M79609 Pain in unspecified limb: Secondary | ICD-10-CM

## 2013-12-17 LAB — RAPID URINE DRUG SCREEN, HOSP PERFORMED
Amphetamines: NOT DETECTED
BARBITURATES: NOT DETECTED
BENZODIAZEPINES: NOT DETECTED
Cocaine: NOT DETECTED
Opiates: NOT DETECTED
TETRAHYDROCANNABINOL: POSITIVE — AB

## 2013-12-17 LAB — BASIC METABOLIC PANEL
Anion gap: 14 (ref 5–15)
BUN: 28 mg/dL — ABNORMAL HIGH (ref 6–23)
CALCIUM: 9 mg/dL (ref 8.4–10.5)
CO2: 22 meq/L (ref 19–32)
CREATININE: 1.44 mg/dL — AB (ref 0.50–1.10)
Chloride: 106 mEq/L (ref 96–112)
GFR calc non Af Amer: 36 mL/min — ABNORMAL LOW (ref >90)
GFR, EST AFRICAN AMERICAN: 42 mL/min — AB (ref >90)
Glucose, Bld: 168 mg/dL — ABNORMAL HIGH (ref 70–99)
Potassium: 4.2 mEq/L (ref 3.7–5.3)
Sodium: 142 mEq/L (ref 137–147)

## 2013-12-17 LAB — CBC
HEMATOCRIT: 28.2 % — AB (ref 36.0–46.0)
Hemoglobin: 8.9 g/dL — ABNORMAL LOW (ref 12.0–15.0)
MCH: 26.3 pg (ref 26.0–34.0)
MCHC: 31.6 g/dL (ref 30.0–36.0)
MCV: 83.4 fL (ref 78.0–100.0)
Platelets: 262 10*3/uL (ref 150–400)
RBC: 3.38 MIL/uL — ABNORMAL LOW (ref 3.87–5.11)
RDW: 14.6 % (ref 11.5–15.5)
WBC: 6 10*3/uL (ref 4.0–10.5)

## 2013-12-17 LAB — ETHANOL: Alcohol, Ethyl (B): <11 mg/dL (ref 0–11)

## 2013-12-17 LAB — FOLATE

## 2013-12-17 LAB — VITAMIN B12: Vitamin B-12: 389 pg/mL (ref 211–911)

## 2013-12-17 LAB — IRON AND TIBC
Iron: 57 ug/dL (ref 42–135)
SATURATION RATIOS: 16 % — AB (ref 20–55)
TIBC: 356 ug/dL (ref 250–470)
UIBC: 299 ug/dL (ref 125–400)

## 2013-12-17 LAB — TROPONIN I
Troponin I: <0.3 ng/mL (ref <0.30)
Troponin I: <0.3 ng/mL (ref <0.30)
Troponin I: <0.3 ng/mL (ref <0.30)

## 2013-12-17 LAB — FERRITIN: Ferritin: 90 ng/mL (ref 10–291)

## 2013-12-17 MED ORDER — POLYETHYLENE GLYCOL 3350 17 G PO PACK
17.0000 g | PACK | Freq: Two times a day (BID) | ORAL | Status: DC
Start: 2013-12-17 — End: 2013-12-17
  Filled 2013-12-17: qty 1

## 2013-12-17 MED ORDER — ALBUTEROL SULFATE (2.5 MG/3ML) 0.083% IN NEBU
2.5000 mg | INHALATION_SOLUTION | RESPIRATORY_TRACT | Status: DC | PRN
  Administered 2013-12-17: 2.5 mg via RESPIRATORY_TRACT
  Filled 2013-12-17: qty 3

## 2013-12-17 MED ORDER — TRAMADOL HCL 50 MG PO TABS: 50.0000 mg | ORAL_TABLET | Freq: Four times a day (QID) | ORAL | Status: DC | PRN

## 2013-12-17 MED ORDER — POLYETHYLENE GLYCOL 3350 17 G PO PACK
17.0000 g | PACK | Freq: Every day | ORAL | Status: DC | PRN
  Administered 2013-12-17 – 2013-12-18 (×2): 17 g via ORAL
  Filled 2013-12-17: qty 1

## 2013-12-17 MED ORDER — LEVALBUTEROL HCL 0.63 MG/3ML IN NEBU
0.6300 mg | INHALATION_SOLUTION | Freq: Four times a day (QID) | RESPIRATORY_TRACT | Status: DC
  Administered 2013-12-17 – 2013-12-18 (×4): 0.63 mg via RESPIRATORY_TRACT
  Filled 2013-12-17 (×8): qty 3

## 2013-12-17 MED ORDER — TRAMADOL HCL 50 MG PO TABS
50.0000 mg | ORAL_TABLET | Freq: Four times a day (QID) | ORAL | Status: DC | PRN
  Administered 2013-12-18: 100 mg via ORAL
  Filled 2013-12-17: qty 2

## 2013-12-17 MED ORDER — ALBUTEROL SULFATE (2.5 MG/3ML) 0.083% IN NEBU
2.5000 mg | INHALATION_SOLUTION | Freq: Two times a day (BID) | RESPIRATORY_TRACT | Status: DC
  Filled 2013-12-17: qty 3

## 2013-12-17 MED ORDER — PANTOPRAZOLE SODIUM 40 MG PO TBEC: 80.0000 mg | DELAYED_RELEASE_TABLET | Freq: Every day | ORAL | Status: DC

## 2013-12-17 MED ORDER — LEVALBUTEROL HCL 0.63 MG/3ML IN NEBU: 0.6300 mg | INHALATION_SOLUTION | RESPIRATORY_TRACT | Status: DC | PRN

## 2013-12-17 NOTE — Progress Notes (Signed)
Nutrition Brief Note  Patient identified on the Malnutrition Screening Tool (MST) Report  Wt Readings from Last 15 Encounters:  12/17/13 208 lb (94.348 kg)  11/13/13 205 lb (92.987 kg)  10/05/13 213 lb (96.616 kg)  05/03/13 230 lb (104.327 kg)  03/26/13 244 lb 7.8 oz (110.9 kg)  03/26/13 244 lb 7.8 oz (110.9 kg)  03/23/13 244 lb 11.2 oz (110.995 kg)  02/18/13 253 lb 3.2 oz (114.851 kg)  02/12/12 226 lb (102.513 kg)  09/14/09 190 lb (86.183 kg)  04/13/09 191 lb (86.637 kg)    Body mass index is 32.57 kg/(m^2). Patient meets criteria for Obesity I based on current BMI.   Current diet order is Heart Healthy, patient is consuming approximately 75% of meals at this time. Labs and medications reviewed.   Patient reported recent dietary changes d/t GI ulcer. Has decreased intake of salty and high fat foods to assist with tolerance, which has resulted in a gradual weight loss of 90 lbs over a 10 year period. Provided pt with "Heart Healthy Nutrition Therapy handout" per pt request and reviewed heart healthy diet recommendations. Pt verbalized understanding, and in agreement to continue with decreasing intake of processed/canned foods  No nutrition interventions warranted at this time. If nutrition issues arise, please consult RD.   Atlee Abide MS RD LDN Clinical Dietitian [phone]

## 2013-12-17 NOTE — Progress Notes (Signed)
VASCULAR LAB PRELIMINARY  PRELIMINARY  PRELIMINARY  PRELIMINARY  VASCULAR LAB PRELIMINARY  PRELIMINARY  PRELIMINARY  PRELIMINARY  Bilateral lower extremity venous duplex completed.    Preliminary report:  Bilateral:  No evidence of DVT, superficial thrombosis, or Baker's Cyst.   Lachele Lievanos, RVS 12/17/2013, 8:31 PM

## 2013-12-17 NOTE — Progress Notes (Signed)
Echo Lab  2D Echocardiogram completed.  Camielle Sizer L Zaydenn Balaguer, RDCS 12/17/2013 9:29 AM   

## 2013-12-17 NOTE — Progress Notes (Signed)
12/17/13 1400  PT Visit Information  Last PT Received On 12/17/13  Reason Eval/Treat Not Completed Patient not medically ready (Bil LE dopplers pending)

## 2013-12-17 NOTE — Progress Notes (Signed)
UR completed 

## 2013-12-17 NOTE — Progress Notes (Signed)
VASCULAR LAB PRELIMINARY  PRELIMINARY  PRELIMINARY  PRELIMINARY  Carotid duplex completed.    Preliminary report:  Bilateral:  1-39% ICA stenosis.  Vertebral artery flow is antegrade.     Sina Sumpter, RVS 12/17/2013, 8:29 PM

## 2013-12-17 NOTE — Progress Notes (Signed)
[facility] PROGRESS NOTE  Midori Dado PRF:[id number] DOB: [birth year] DOA: 12/16/2013 PCP: Philis Fendt, MD  Assessment/Plan: #1 syncope Questionable etiology. CT of the head is negative. 2-D echo unremarkable. Carotid Dopplers are pending. Patient with no further syncopal episodes. Patient is not orthostatic. Will follow for now.  #2 dyspnea  Patient with complaints of shortness of breath on exertion and has been ongoing for several weeks now. Patient stated was very assessed by her PCP. Patient comes in with complaints of chest pain and some shortness of breath. Patient currently with sats of 100%. Will check a oxygen on room and on ambulation. Will need outpatient followup with pulmonary.  #3 atypical chest pain Cardiac enzymes negative x3. 2-D echo was unremarkable. Monitor for now.  #4 lower extremity edema and pain Venous duplex pending.  #5 acute on chronic kidney disease stage III.  Baseline creatinine from 1.0-1.2. Follow.  #6 anemia Patient with no overt bleeding. May be an anemia of chronic disease. May be dilutional in nature. Will check an anemia panel. Follow H&H.  #7 asthma Patient with no wheezing noted on exam. Patient was in fair air movement. Will continue nebulizer treatments.  #8 hypertension Stable. Continue Norvasc and Coreg.  #9 bipolar disorder with psychotic features Stable. Continue home regimen Seroquel.  Code Status: Full Family Communication: Updated patient no family at bedside. Disposition Plan: Home when medically stable.   Consultants:  None  Procedures:  2-D echo 12/17/2013  CT head 12/17/2013  Chest x-ray 12/16/2013  Antibiotics:  None  HPI/Subjective: Patient with multiple complaints. Patient stating that has had several months of worsening shortness of breath with no significant improvement. Patient states chronically on home O2 and was to be reassessed by her PCPs office today.  Objective: Filed Vitals:   12/17/13 0433  BP: 148/72  Pulse: 109  Temp: 98 F (36.7 C)  Resp: 20    Intake/Output Summary (Last 24 hours) at 12/17/13 1046 Last data filed at 12/17/13 0900  Gross per 24 hour  Intake 721.25 ml  Output    550 ml  Net 171.25 ml   Filed Weights   12/17/13 0004  Weight: 94.348 kg (208 lb)    Exam:   General:  nad  Cardiovascular: rrr  Respiratory: Clear to auscultation bilaterally. No crackles, no wheezing, no rhonchi.  Abdomen: Soft, nontender, nondistended, positive bowel sounds  Musculoskeletal: No clubbing cyanosis or edema  Data Reviewed: Basic Metabolic Panel:  Recent Labs Lab 12/16/13 1937 12/17/13 0620  NA 143 142  K 3.6* 4.2  CL 103 106  CO2 22 22  GLUCOSE 129* 168*  BUN 28* 28*  CREATININE 1.62* 1.44*  CALCIUM 10.2 9.0   Liver Function Tests:  Recent Labs Lab 12/16/13 1937  AST 62*  ALT 33  ALKPHOS 95  BILITOT 0.9  PROT 8.3  ALBUMIN 4.3   No results found for this basename: LIPASE, AMYLASE,  in the last 168 hours No results found for this basename: AMMONIA,  in the last 168 hours CBC:  Recent Labs Lab 12/16/13 1937 12/17/13 0620  WBC 10.7* 6.0  NEUTROABS 6.5  --   HGB 10.9* 8.9*  HCT 34.3* 28.2*  MCV 82.9 83.4  PLT 306 262   Cardiac Enzymes:  Recent Labs Lab 12/17/13 0025 12/17/13 0620  TROPONINI <0.30 <0.30   BNP (last 3 results)  Recent Labs  05/01/13 1135 06/07/13 0726 08/27/13 1000  PROBNP 231.1* 138.5* 93.5   CBG: No results found for this basename: GLUCAP,  in  the last 168 hours  No results found for this or any previous visit (from the past 240 hour(s)).   Studies: Dg Chest 2 View (if Patient Has Fever And/or Copd)  12/16/2013   CLINICAL DATA:  Shortness of breath, asthma  EXAM: CHEST  2 VIEW  COMPARISON:  11/13/2013  FINDINGS: Cardiomediastinal silhouette is stable. Elevation of the right hemidiaphragm again noted. No acute infiltrate or pulmonary edema. Mild degenerative changes mid thoracic spine.   IMPRESSION: No active cardiopulmonary disease.   Electronically Signed   By: Lahoma Crocker M.D.   On: 12/16/2013 18:13    Scheduled Meds: . amLODipine  5 mg Oral Q breakfast  . aspirin EC  81 mg Oral Daily  . atorvastatin  10 mg Oral q1800  . carvedilol  25 mg Oral BID WC  . fesoterodine  8 mg Oral Q lunch  . folic acid  1 mg Oral Daily  . heparin  5,000 Units Subcutaneous 3 times per day  . levalbuterol  0.63 mg Nebulization Q6H  . LORazepam  1 mg Intravenous Once  . mometasone-formoterol  2 puff Inhalation BID  . multivitamin with minerals  1 tablet Oral Q lunch  . omega-3 acid ethyl esters  1 g Oral Daily  . pantoprazole  80 mg Oral Daily  . potassium chloride SA  40 mEq Oral Daily  . QUEtiapine  50 mg Oral QHS  . sodium chloride  3 mL Intravenous Q12H   Continuous Infusions: . sodium chloride Stopped (12/16/13 2348)  . sodium chloride 75 mL/hr at 12/17/13 5003    Principal Problem:   Syncope Active Problems:   HYPERLIPIDEMIA   HYPERTENSION   CARDIOMYOPATHY   ASTHMA   FIBROMYALGIA   CHEST PAIN-UNSPECIFIED   Bipolar 1 disorder   Acute on chronic renal failure   Dyspnea    Time spent: 35 minutes    [facility] MD [facility] Pager [phone]. If 7PM-7AM, please contact night-coverage at [email or web address], password [facility] 12/17/2013, 10:46 AM  LOS: 1 day

## 2013-12-17 NOTE — Progress Notes (Signed)
Upon admission to the floor, pt requested information regarding advance directives. Gave pt informational brochure and packet regarding this process. Pt verbalizes she wants to get this completed during this hospital admission. Blanchard Kelch, RN

## 2013-12-18 LAB — BASIC METABOLIC PANEL
Anion gap: 14 (ref 5–15)
BUN: 26 mg/dL — AB (ref 6–23)
CALCIUM: 9.5 mg/dL (ref 8.4–10.5)
CO2: 21 mEq/L (ref 19–32)
Chloride: 108 mEq/L (ref 96–112)
Creatinine, Ser: 1.21 mg/dL — ABNORMAL HIGH (ref 0.50–1.10)
GFR calc Af Amer: 52 mL/min — ABNORMAL LOW (ref >90)
GFR, EST NON AFRICAN AMERICAN: 45 mL/min — AB (ref >90)
GLUCOSE: 128 mg/dL — AB (ref 70–99)
POTASSIUM: 4.3 meq/L (ref 3.7–5.3)
Sodium: 143 mEq/L (ref 137–147)

## 2013-12-18 LAB — CBC
HCT: 30.9 % — ABNORMAL LOW (ref 36.0–46.0)
Hemoglobin: 9.7 g/dL — ABNORMAL LOW (ref 12.0–15.0)
MCH: 26.1 pg (ref 26.0–34.0)
MCHC: 31.4 g/dL (ref 30.0–36.0)
MCV: 83.1 fL (ref 78.0–100.0)
PLATELETS: 273 10*3/uL (ref 150–400)
RBC: 3.72 MIL/uL — AB (ref 3.87–5.11)
RDW: 14.7 % (ref 11.5–15.5)
WBC: 11.3 10*3/uL — ABNORMAL HIGH (ref 4.0–10.5)

## 2013-12-18 NOTE — Evaluation (Signed)
Occupational Therapy Evaluation Patient Details Name: Heidi Young MRN: [id number] DOB: 28-Oct-1945 Today's Date: 12/18/2013    History of Present Illness 68 yo female admitted with syncope. Hx of L TKA 2014, fibromyalgia, HTN, bipolar d/o with psychotic features. Pt lives alone.    Clinical Impression   Pt was admitted for the above.  Completed education in acute but recommend HHOT follow her to further assess bathroom and DME and also continue with strategies for meals and work on [place].      Follow Up Recommendations  Home health OT    Equipment Recommendations   (defer to [facility])    Recommendations for Other Services       Precautions / Restrictions Precautions Precautions: None Restrictions Weight Bearing Restrictions: No      Mobility Bed Mobility Overal bed mobility: Modified Independent                Transfers Overall transfer level: Modified independent                    Balance                                            ADL Overall ADL's : Needs assistance/impaired     Grooming: Supervision/safety;Standing   Upper Body Bathing: Set up;Sitting   Lower Body Bathing: Sit to/from stand;Supervison/ safety   Upper Body Dressing : Set up;Sitting   Lower Body Dressing: Minimal assistance;Sit to/from stand   Toilet Transfer: Stand-pivot;Modified Independent (chair)             General ADL Comments: pt has several bathroom DME problems:  tub bench too short and never received 3:1 but has safety frame around toilet:  will ask HHOT to further assess and check to see if extensions can be purchased for bench.  Pt has fibromyalgia and sometimes cannot get herself something to eat.  Discussed strategies to have things on hand.  Pt has AE somewhere.  Reviewed sock aide and reacher:  sock aide is needed the most or else she can go without socks.       Vision                     Perception     Praxis       Pertinent Vitals/Pain No c/o pain     Hand Dominance     Extremity/Trunk Assessment Upper Extremity Assessment Upper Extremity Assessment: Overall WFL for tasks assessed   Lower Extremity Assessment Lower Extremity Assessment: Overall WFL for tasks assessed   Cervical / Trunk Assessment Cervical / Trunk Assessment: Normal   Communication Communication Communication: No difficulties   Cognition Arousal/Alertness: Awake/alert Behavior During Therapy: WFL for tasks assessed/performed Overall Cognitive Status: Within Functional Limits for tasks assessed                     General Comments       Exercises       Shoulder Instructions      Home Living Family/patient expects to be discharged to:: Private residence Living Arrangements: Alone   Type of Home: House Home Access: Stairs to enter [employer] of Steps: 4 Entrance Stairs-Rails: Right;Left Home Layout: One level     Bathroom Shower/Tub: Tub/shower unit Shower/tub characteristics: Architectural technologist: Standard     Home Equipment:  (tub bench, which is too  short for tub; safety bars/toilet)          Prior Functioning/Environment Level of Independence: Independent with assistive device(s)             OT Diagnosis:     OT Problem List:     OT Treatment/Interventions:      OT Goals(Current goals can be found in the care plan section) Acute Rehab OT Goals Patient Stated Goal: regain independence  OT Frequency:     Barriers to D/C:            Co-evaluation              End of Session    Activity Tolerance: Patient tolerated treatment well Patient left: in chair;with call bell/phone within reach   Time: 7622-6333 OT Time Calculation (min): 19 min Charges:  OT General Charges $OT Visit: 1 Procedure OT Evaluation $Initial OT Evaluation Tier I: 1 Procedure OT Treatments $Self Care/Home Management : 8-22 mins G-Codes: OT G-codes **NOT FOR INPATIENT  CLASS** Functional Assessment Tool Used: clinical judgment Functional Limitation: Self care Self Care Current Status (L4562): At least 1 percent but less than 20 percent impaired, limited or restricted Self Care Goal Status (B6389): At least 1 percent but less than 20 percent impaired, limited or restricted Self Care Discharge Status [phone]): At least 1 percent but less than 20 percent impaired, limited or restricted  [facility] 12/18/2013, 11:58 AM  Lesle Chris, OTR/L [phone] 12/18/2013

## 2013-12-18 NOTE — Evaluation (Addendum)
Physical Therapy Evaluation Patient Details Name: Heidi Young MRN: [id number] DOB: 1945-07-30 Today's Date: 12/18/2013   History of Present Illness  68 yo female admitted with syncope. Hx of L TKA 2014, fibromyalgia, HTN, bipolar d/o with psychotic features. Pt lives alone.   Clinical Impression  On eval, pt was supervision level assist for mobility-able to ambulate ~175 feet with 4 wheeled walker. Demonstrates some general weakness and decreased activity tolerance. Recommend HHA if at all possible to assist with ADLs/IADLS.     Follow Up Recommendations Home Health PT-Home safety evaluation.  (home health aide, if possible)    Equipment Recommendations   (4 wheeled walker, if possible) to allow for rest breaks with longer distances/community ambulation    Recommendations for Other Services OT consult     Precautions / Restrictions Precautions Precautions: None Restrictions Weight Bearing Restrictions: No      Mobility  Bed Mobility Overal bed mobility: Modified Independent                Transfers Overall transfer level: Modified independent                  Ambulation/Gait Ambulation/Gait assistance: Supervision Ambulation Distance (Feet): 175 Feet Assistive device: 4-wheeled walker Gait Pattern/deviations: Step-through pattern     General Gait Details: slow gait speed. 2 standing rest breaks. O2 sats 98-100% RA during ambulation.   Stairs            Wheelchair Mobility    Modified Rankin (Stroke Patients Only)       Balance                                             Pertinent Vitals/Pain No c/o acute pain during session. Pt endorses chronic pain.     Home Living Family/patient expects to be discharged to:: Private residence Living Arrangements: Alone   Type of Home: House Home Access: Stairs to enter Entrance Stairs-Rails: Psychiatric nurse of Steps: 4 Home Layout: One level Home  Equipment: Environmental consultant - 2 wheels;Cane - single point      Prior Function Level of Independence: Independent with assistive device(s)               Hand Dominance        Extremity/Trunk Assessment   Upper Extremity Assessment: Overall WFL for tasks assessed           Lower Extremity Assessment: Overall WFL for tasks assessed      Cervical / Trunk Assessment: Normal  Communication   Communication: No difficulties  Cognition Arousal/Alertness: Awake/alert Behavior During Therapy: WFL for tasks assessed/performed Overall Cognitive Status: Within Functional Limits for tasks assessed                      General Comments      Exercises        Assessment/Plan    PT Assessment Patient needs continued PT services  PT Diagnosis Difficulty walking   PT Problem List Decreased activity tolerance;Decreased mobility;Decreased balance;Pain  PT Treatment Interventions Gait training;DME instruction;Stair training;Functional mobility training;Therapeutic activities;Patient/family education;Balance training   PT Goals (Current goals can be found in the Care Plan section) Acute Rehab PT Goals Patient Stated Goal: regain independence PT Goal Formulation: With patient Time For Goal Achievement: 01/01/14 Potential to Achieve Goals: Good    Frequency Min 3X/week   Barriers to discharge  Co-evaluation               End of Session Equipment Utilized During Treatment: Gait belt Activity Tolerance: Patient tolerated treatment well Patient left: in chair;with call bell/phone within reach      Functional Assessment Tool Used: clinical judgement Functional Limitation: Mobility: Walking and moving around Mobility: Walking and Moving Around Current Status ([id number]): At least 1 percent but less than 20 percent impaired, limited or restricted Mobility: Walking and Moving Around Goal Status [phone]): 0 percent impaired, limited or restricted    Time:  1002-1033 PT Time Calculation (min): 31 min   Charges:   PT Evaluation $Initial PT Evaluation Tier I: 1 Procedure PT Treatments $Gait Training: 8-22 mins   PT G Codes:   Functional Assessment Tool Used: clinical judgement Functional Limitation: Mobility: Walking and moving around    [facility], MPT Pager: [phone]

## 2013-12-18 NOTE — Progress Notes (Signed)
CARE MANAGEMENT NOTE 12/18/2013  Patient:  NIOMIE, ENGLERT   Account Number:  [id number]  Date Initiated:  12/18/2013  Documentation initiated by:  Gabriel Earing  Subjective/Objective Assessment:   pt admitted with cco Syncope     Action/Plan:   from home   Anticipated DC Date:  12/18/2013   Anticipated DC Plan:  [place]  CM consult      Choice offered to / List presented to:          [facility] [place] arranged  [place]      [place] agency  [place]   Status of service:  Completed, signed off Medicare Important Message given?   (If response is "NO", the following Medicare IM given date fields will be blank) Date Medicare IM given:   Medicare IM given by:   Date Additional Medicare IM given:   Additional Medicare IM given by:    Discharge Disposition:  [place]  Per UR Regulation:  Reviewed for med. necessity/level of care/duration of stay  If discussed at [place] of Stay Meetings, dates discussed:    Comments:  12/18/13 MMcGibboney, RN, BSN Spoke with pt concerning [place], she selected [place] a call was made and information faxed to [place]. Pt states, "I forgot I have a walker at home."

## 2013-12-18 NOTE — Progress Notes (Signed)
Patient states "she has a rolling waker" at home.

## 2013-12-18 NOTE — Discharge Summary (Signed)
Physician Discharge Summary  Heidi Young XHB:[id number] DOB: 11-Apr-1946 DOA: 12/16/2013  PCP: Philis Fendt, MD  Admit date: 12/16/2013 Discharge date: 12/18/2013  Time spent: 65 minutes  Recommendations for Outpatient Follow-up:  1. Followup with Dr. Elsworth Soho  Of pulmonary  on 12/22/2013 for further evaluation of her shortness of breath/dyspnea and management of her asthma.  2.  followup with AVBUERE,EDWIN A, MD in 1 week. On followup a basic metabolic profile need to be done to followup on electrolytes and renal function.  Discharge Diagnoses:  Principal Problem:   Syncope Active Problems:   HYPERLIPIDEMIA   HYPERTENSION   CARDIOMYOPATHY   ASTHMA   FIBROMYALGIA   CHEST PAIN-UNSPECIFIED   Bipolar 1 disorder   Acute on chronic renal failure   Dyspnea   Discharge Condition: Stable and improved  Diet recommendation: Heart healthy  Filed Weights   12/17/13 0004  Weight: 94.348 kg (208 lb)    History of present illness:  68 year old female with a history of asthma, Takatsubos cardiomyopathy, fibromyalgia, hypertension, bipolar disorder with psychotic features presents with two-day history of worsening shortness of breath. The patient states that the power to her home has been turned off for the past 2 days, and she has not had any air conditioning. She feels this has made her breathing worse than usual. She went to urgent care on 12/15/2013. After nebulizer treatments, she felt much better and was discharged home. However, today the patient was unable to use her nebulizer at home because of lack of electricity. As a result her shortness of breath but worse again. EMS was activated and she was brought to the emergency department. In the emergency department, the patient's breathing was improved, but according to EDP, Dr. Lacretia Leigh, the patient's oxygen saturation dropped into the high 80s with ambulation. As a result, he wanted the patient to be admitted for observation. She denies  any fevers, chills, nausea, vomiting, diarrhea, hemoptysis patient complains of a cough with white sputum production.  Additional history revealed that the patient has had 2 syncopal episodes over the last 10 days. She states that this occurs usually with positional change as she gets up from a seated to standing position. She denies any biting of her tongue, bowel or bladder incontinence. She states that the syncope was just a short period of time lasting 15-20 seconds. She does complain of intermittent chest discomfort without any exacerbating or alleviating factors. She has had occasional dizziness without any visual disturbance, or focal extremity weakness.  In the emergency department, the patient was given Solu-Medrol 125 mg, Ativan 1 mg IV, continuous albuterol nebulizer as well as Atrovent nebulizer. Although the patient's breathing is somewhat better, she remained short of breath and desaturated into the high 80s with ambulation. As a result, admission was requested. The patient  received 1 L normal saline in the emergency department. CBC were unremarkable. AST was 62, ALT 33, phosphatase 95 total bilirubin 0.9. Serum creatinine was 1.62.   Hospital Course:  #1 syncope  Questionable etiology. CT of the head is negative. 2-D echo unremarkable. Carotid Dopplers had no significant ICA stenosis. 2-D echo which was done had a EF of 60-65% with no wall motion abnormalities. Patient was not orthostatic. Patient did not have any further syncopal episodes during the hospitalization. Patient be discharged in stable and improved condition and is to followup with PCP as outpatient.  #2 dyspnea/chronic respiratory distress on home O2 Patient with complaints of shortness of breath on exertion and has been ongoing for  several weeks now. Patient stated was assessed by her PCP. Patient comes in with complaints of chest pain and some shortness of breath. Patient currently with sats of 100%. Patient was placed on  O2. 2-D echo which was done did have a EF of 60-65% with no wall motion abnormalities. Carotid Dopplers which were done had no significant ICA stenosis. Due to patient's elevated renal function a CT angiogram was not done. Lower extremity Dopplers were negative for DVT. An appointment has been set up for patient to be seen by pulmonary as outpatient on 12/22/2013 for further evaluation and management.  #3 atypical chest pain  Cardiac enzymes negative x3. 2-D echo was unremarkable. Patient did not have any further chest pain  During the hospitalization. Outpatient followup. #4 lower extremity edema and pain  Venous duplex was negative for DVT. Patient's edema and pain improved and had resolved by day of discharge.  #5 acute on chronic kidney disease stage III.  Baseline creatinine from 1.0-1.2. Patient noted to have Cr 1.62. Patient was hydrated with IV fluids renal function trended down and had improved by day of discharge. Patient's creatinine was down to 1.21. #6 anemia  Patient with no overt bleeding. May be an anemia of chronic disease. May be dilutional in nature. Patient did not have any overt GI bleed. Anemia panel was consistent with anemia of chronic disease. Patient's hemoglobin remained stable and was 9.7 on day of discharge. #7 asthma  Patient with no wheezing noted on exam. Patient with fair movement. Patient was maintained on nebulizer treatments. Outpatient followup. #8 hypertension  Stable. Continued on Norvasc and Coreg.  #9 bipolar disorder with psychotic features  Stable. Continued on home regimen Seroquel.   Procedures:  2-D echo 12/17/2013  Carotid Dopplers 12/17/2013  CT head 12/17/2013  Chest x-ray 12/16/2013  Lower extremity Dopplers 12/17/2013  Consultations:  None  Discharge Exam: Filed Vitals:   12/18/13 0534  BP: 116/59  Pulse: 69  Temp: 97.9 F (36.6 C)  Resp: 16    General: NAD Cardiovascular: RRR Respiratory: CTAB  Discharge  Instructions You were cared for by a hospitalist during your hospital stay. If you have any questions about your discharge medications or the care you received while you were in the hospital after you are discharged, you can call the unit and asked to speak with the hospitalist on call if the hospitalist that took care of you is not available. Once you are discharged, your primary care physician will handle any further medical issues. Please note that NO REFILLS for any discharge medications will be authorized once you are discharged, as it is imperative that you return to your primary care physician (or establish a relationship with a primary care physician if you do not have one) for your aftercare needs so that they can reassess your need for medications and monitor your lab values.      Discharge Instructions   Diet - low sodium heart healthy    Complete by:  As directed      Discharge instructions    Complete by:  As directed   Follow up with Dr Elsworth Soho, pulmonogist on 12/22/13. Follow up with AVBUERE,EDWIN A, MD in 1 week.     Increase activity slowly    Complete by:  As directed             Medication List         albuterol 108 (90 BASE) MCG/ACT inhaler  Commonly known as:  PROVENTIL HFA;VENTOLIN HFA  Inhale 2 puffs into the lungs 5 (five) times daily as needed for wheezing or shortness of breath.     albuterol (2.5 MG/3ML) 0.083% nebulizer solution  Commonly known as:  PROVENTIL  Take 2.5 mg by nebulization every 4 (four) hours as needed for wheezing or shortness of breath.     amLODipine 5 MG tablet  Commonly known as:  NORVASC  Take 1 tablet (5 mg total) by mouth daily with breakfast.     carvedilol 25 MG tablet  Commonly known as:  COREG  Take 1 tablet (25 mg total) by mouth 2 (two) times daily with a meal.     diclofenac sodium 1 % Gel  Commonly known as:  VOLTAREN  Apply 2 g topically every 4 (four) hours as needed (pain).     EPIPEN 0.3 mg/0.3 mL Soaj injection   Generic drug:  EPINEPHrine  Inject 0.3 mg into the muscle as needed (allergic reaction).     fesoterodine 8 MG Tb24 tablet  Commonly known as:  TOVIAZ  Take 1 tablet (8 mg total) by mouth daily with lunch.     Fish Oil 300 MG Caps  Take 300 mg by mouth 2 (two) times daily.     folic acid 1 MG tablet  Commonly known as:  FOLVITE  Take 1 mg by mouth daily.     furosemide 40 MG tablet  Commonly known as:  LASIX  Take 40 mg by mouth daily.     levocetirizine 5 MG tablet  Commonly known as:  XYZAL  Take 5 mg by mouth every evening.     mometasone-formoterol 100-5 MCG/ACT Aero  Commonly known as:  DULERA  Inhale 2 puffs into the lungs 2 (two) times daily.     multivitamin with minerals Tabs tablet  Take 1 tablet by mouth daily with lunch.     neomycin-bacitracin-polymyxin [phone] ointment  Apply 1 application topically as needed (rash under breast).     pantoprazole 40 MG tablet  Commonly known as:  PROTONIX  Take 80 mg by mouth daily.     Papaya Tabs  Take 4 tablets by mouth daily with breakfast.     polyethylene glycol packet  Commonly known as:  MIRALAX / GLYCOLAX  Take 17 g by mouth daily as needed (constipation). Mix in 8 oz liquid and drink     potassium chloride SA 20 MEQ tablet  Commonly known as:  K-DUR,KLOR-CON  Take 2 tablets (40 mEq total) by mouth daily.     pravastatin 40 MG tablet  Commonly known as:  PRAVACHOL  Take 1 tablet (40 mg total) by mouth daily after supper.     predniSONE 10 MG tablet  Commonly known as:  DELTASONE  Take 3 tablets (30 mg total) by mouth daily.     QUEtiapine 50 MG tablet  Commonly known as:  SEROQUEL  Take 1 tablet (50 mg total) by mouth at bedtime.     tiZANidine 4 MG tablet  Commonly known as:  ZANAFLEX  Take 4 mg by mouth every 8 (eight) hours as needed for muscle spasms.     traMADol 50 MG tablet  Commonly known as:  ULTRAM  Take 50 mg by mouth 3 (three) times daily as needed (pain).     Vitamin D3 5000  UNITS Caps  Take 10,000 Units by mouth daily with lunch.     ZOFRAN PO  Take 1 tablet by mouth 3 (three) times daily as needed (nausea and vomiting).  Allergies  Allergen Reactions  . Bee Venom Anaphylaxis  . Other Anaphylaxis    Red ant's, roaches   . Avelox [Moxifloxacin Hydrochloride] Rash and Other (See Comments)    Fever / Upset Stomach  . Nsaids Other (See Comments)    Due to stomach ulcers   Follow-up Information   Follow up with Rigoberto Noel., MD On 12/22/2013. (f/u at 130pm)    Specialty:  Pulmonary Disease   Contact information:   [address] [address] [phone]       Follow up with Nolene Ebbs A, MD In 1 week.   Specialty:  Internal Medicine   Contact information:   [address] [place] [address] [phone]        The results of significant diagnostics from this hospitalization (including imaging, microbiology, ancillary and laboratory) are listed below for reference.    Significant Diagnostic Studies: Dg Chest 2 View (if Patient Has Fever And/or Copd)  12/16/2013   CLINICAL DATA:  Shortness of breath, asthma  EXAM: CHEST  2 VIEW  COMPARISON:  11/13/2013  FINDINGS: Cardiomediastinal silhouette is stable. Elevation of the right hemidiaphragm again noted. No acute infiltrate or pulmonary edema. Mild degenerative changes mid thoracic spine.  IMPRESSION: No active cardiopulmonary disease.   Electronically Signed   By: Lahoma Crocker M.D.   On: 12/16/2013 18:13   Ct Head Wo Contrast  12/17/2013   CLINICAL DATA:  Dizziness.  History renal cell carcinoma  EXAM: CT HEAD WITHOUT CONTRAST  TECHNIQUE: Contiguous axial images were obtained from the base of the skull through the vertex without intravenous contrast.  COMPARISON:  CT 11/25/2009  FINDINGS: Ventricle size is normal. Negative for acute or chronic infarction. Negative for hemorrhage or fluid collection. Negative for mass or edema. No shift of the midline structures.  Calvarium is  intact.  IMPRESSION: Normal   Electronically Signed   By: Franchot Gallo M.D.   On: 12/17/2013 12:19    Microbiology: No results found for this or any previous visit (from the past 240 hour(s)).   Labs: Basic Metabolic Panel:  Recent Labs Lab 12/16/13 1937 12/17/13 0620 12/18/13 0804  NA 143 142 143  K 3.6* 4.2 4.3  CL 103 106 108  CO2 22 22 21   GLUCOSE 129* 168* 128*  BUN 28* 28* 26*  CREATININE 1.62* 1.44* 1.21*  CALCIUM 10.2 9.0 9.5   Liver Function Tests:  Recent Labs Lab 12/16/13 1937  AST 62*  ALT 33  ALKPHOS 95  BILITOT 0.9  PROT 8.3  ALBUMIN 4.3   No results found for this basename: LIPASE, AMYLASE,  in the last 168 hours No results found for this basename: AMMONIA,  in the last 168 hours CBC:  Recent Labs Lab 12/16/13 1937 12/17/13 0620 12/18/13 0745  WBC 10.7* 6.0 11.3*  NEUTROABS 6.5  --   --   HGB 10.9* 8.9* 9.7*  HCT 34.3* 28.2* 30.9*  MCV 82.9 83.4 83.1  PLT 306 262 273   Cardiac Enzymes:  Recent Labs Lab 12/17/13 0025 12/17/13 0620 12/17/13 1210  TROPONINI <0.30 <0.30 <0.30   BNP: BNP (last 3 results)  Recent Labs  05/01/13 1135 06/07/13 0726 08/27/13 1000  PROBNP 231.1* 138.5* 93.5   CBG: No results found for this basename: GLUCAP,  in the last 168 hours     Signed:  [facility] MD [facility] 12/18/2013, 2:02 PM

## 2013-12-22 ENCOUNTER — Institutional Professional Consult (permissible substitution): Payer: Self-pay | Admitting: Pulmonary Disease

## 2013-12-28 IMAGING — CR DG CHEST 2V
2 series · 2 of 2 positions shown · non-contrast
Comparison: 04/11/2013

CLINICAL DATA: Dyspnea

EXAM:
CHEST  2 VIEW

[w chest lat]
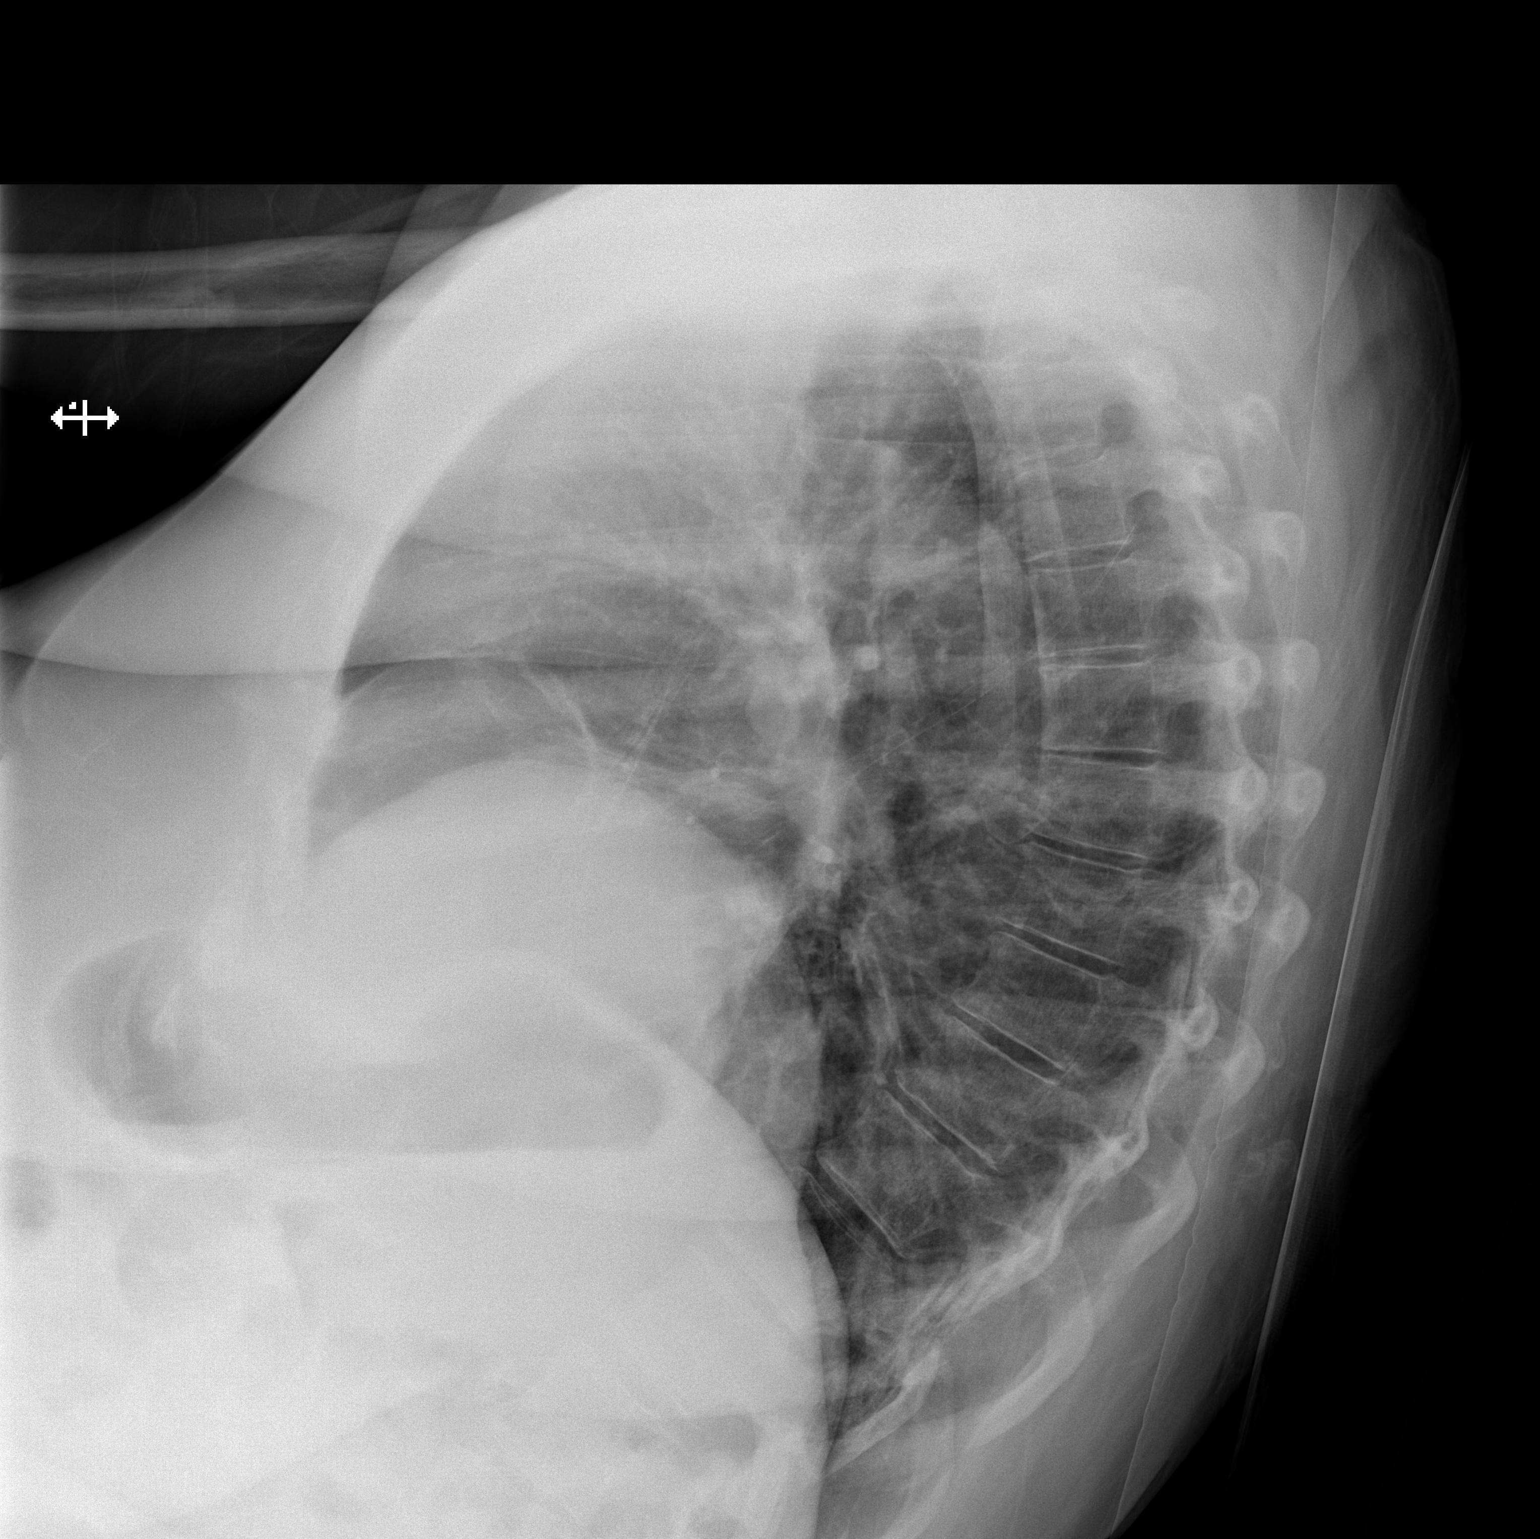

[x chest ap]
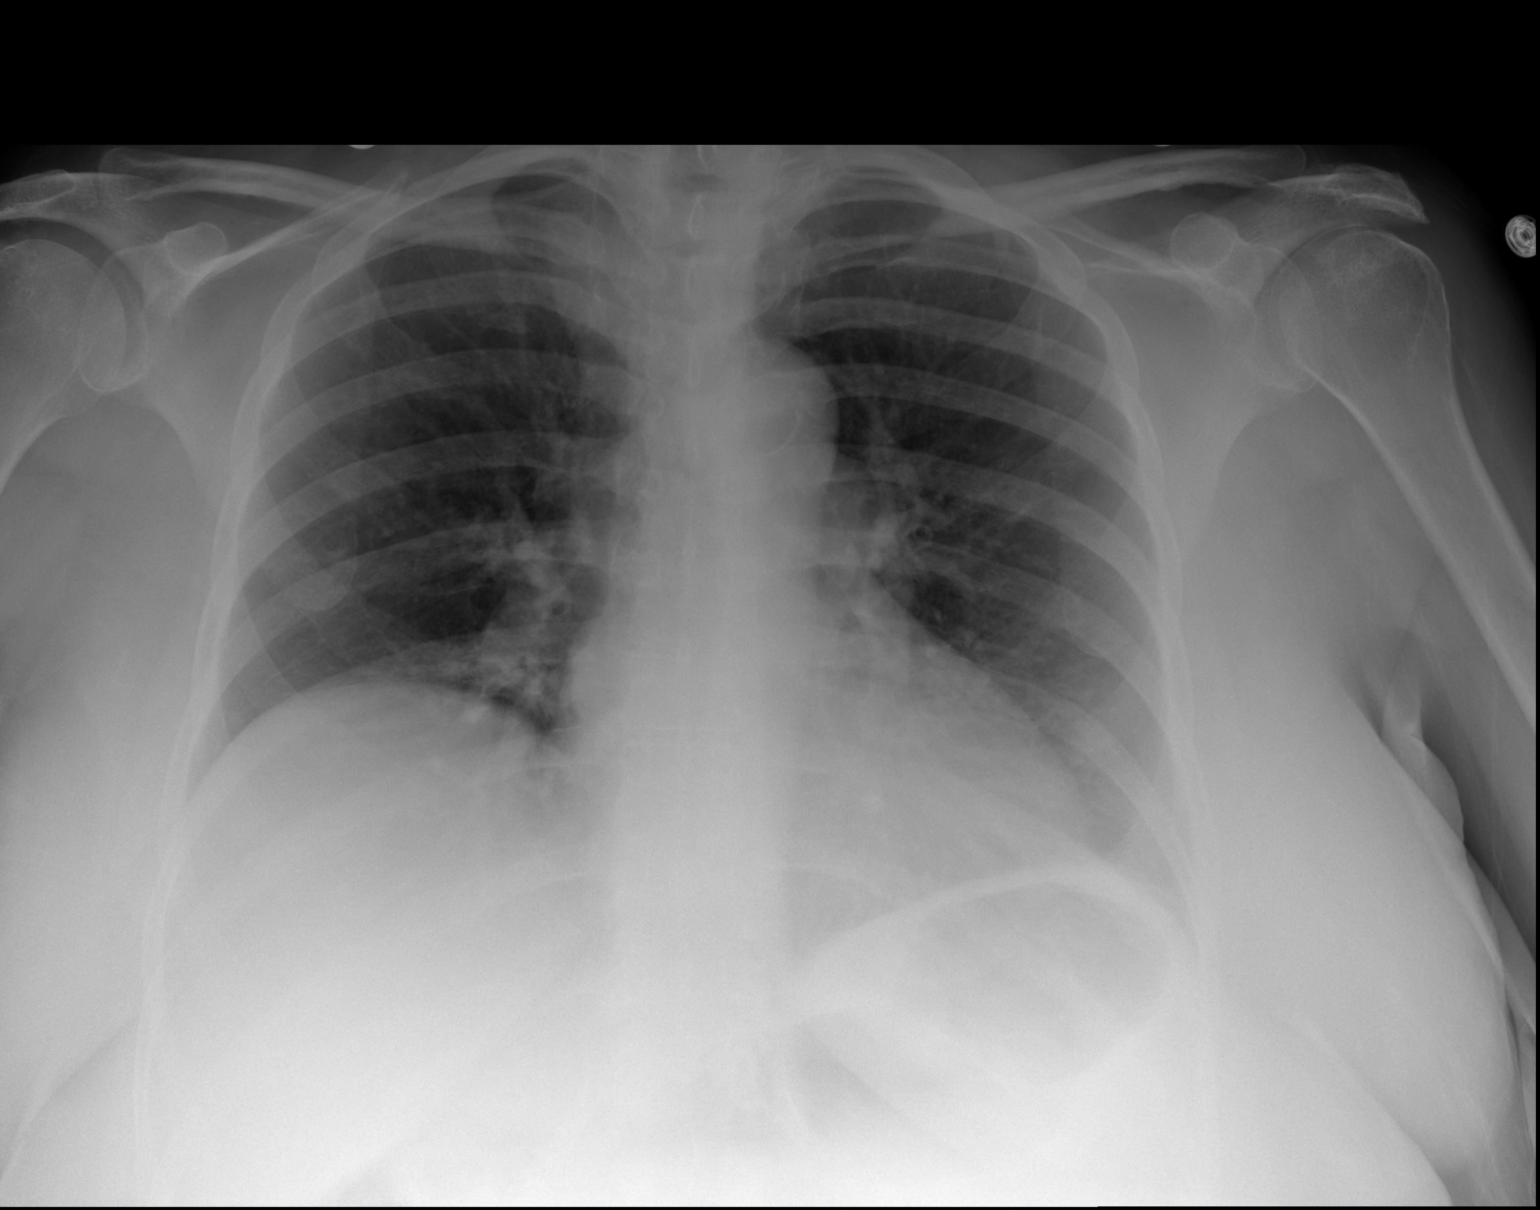

[2 of 2 positions shown; findings below may reference images not displayed]

FINDINGS: The right hemidiaphragm is again elevated. The cardiac shadow is
stable. The lungs are well aerated. Mild right basilar atelectasis
is seen. No bony abnormality is noted.
IMPRESSION: Mild right basilar atelectasis.

## 2013-12-28 IMAGING — CT CT ANGIO CHEST
1 of 2 series · 20 of 32 positions shown · IV contrast (OMNIPAQUE 300)
Comparison: PA and lateral chest earlier this same day and
04/11/2013.

CLINICAL DATA: Chest pain and shortness of breath.

EXAM:
CT ANGIOGRAPHY CHEST WITH CONTRAST
TECHNIQUE: Multidetector CT imaging of the chest was performed using the
standard protocol during bolus administration of intravenous
contrast. Multiplanar CT image reconstructions including MIPs were
obtained to evaluate the vascular anatomy.
CONTRAST:  100 mL OMNIPAQUE IOHEXOL 350 MG/ML SOLN

[Series 7: thins for pacs · axial · 0.81mm/px · z∈[+1355,+1569]mm · 20 of 238 slices shown]
[im 12/238  lung]
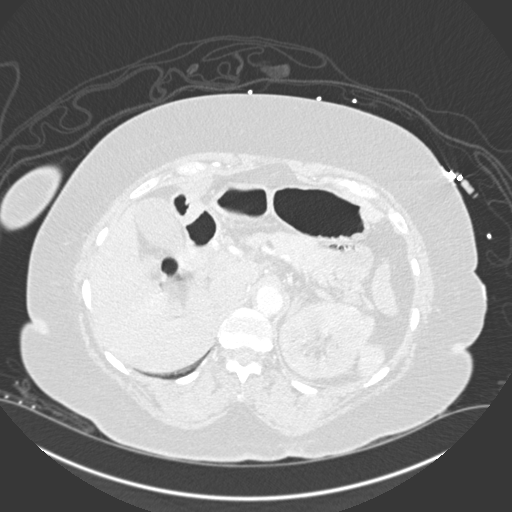
[im 24/238  mediastinal]
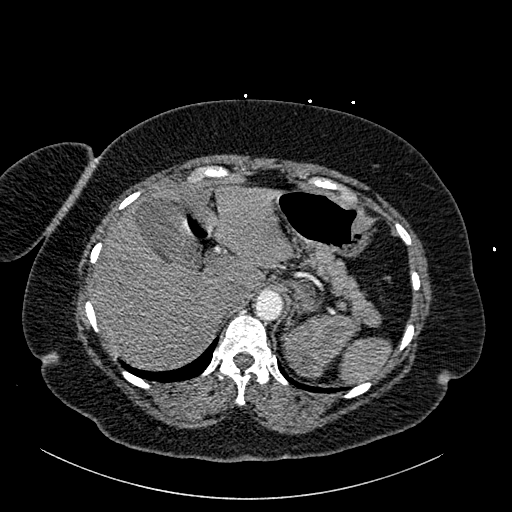
[im 36/238  lung]
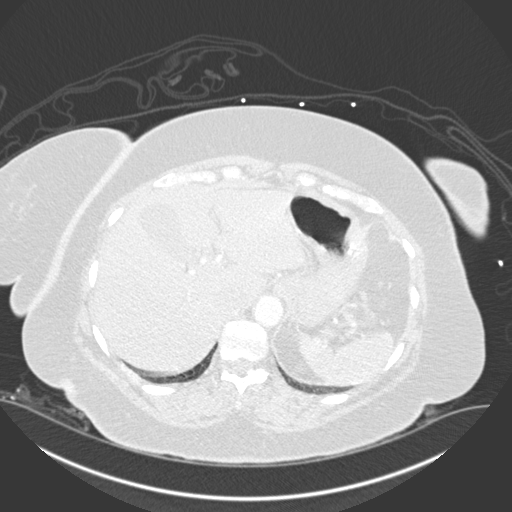
[im 48/238  mediastinal]
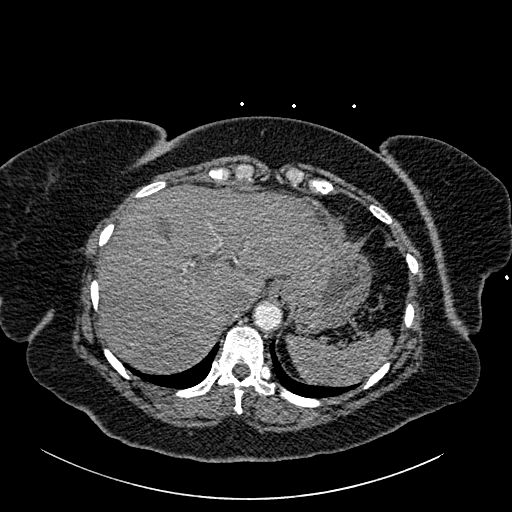
[im 60/238  lung]
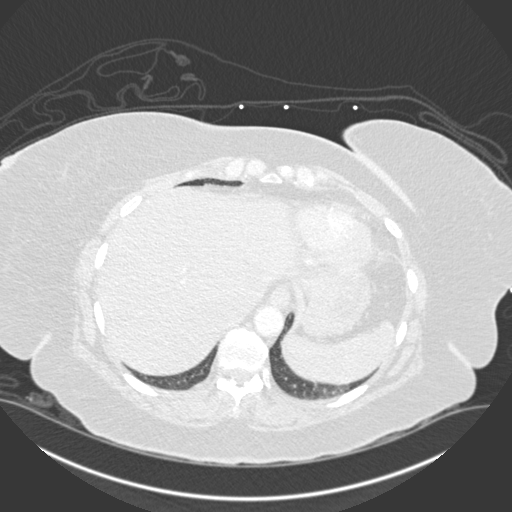
[im 80/238  mediastinal]
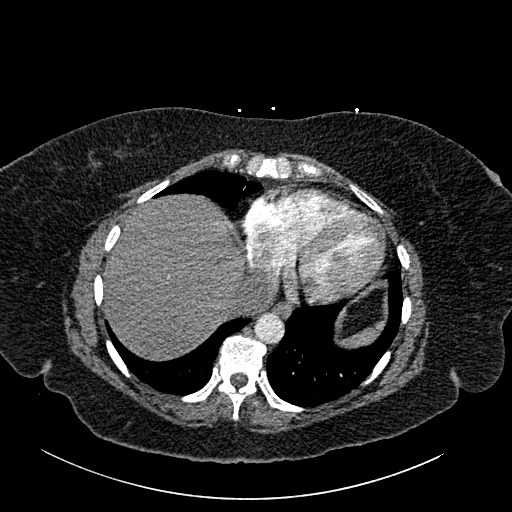
[im 83/238  lung]
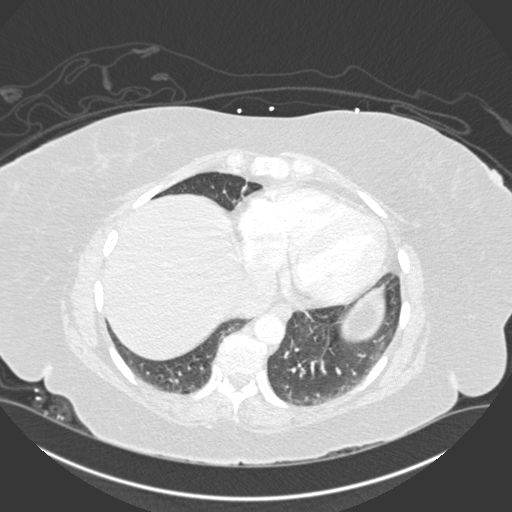
[im 95/238  mediastinal]
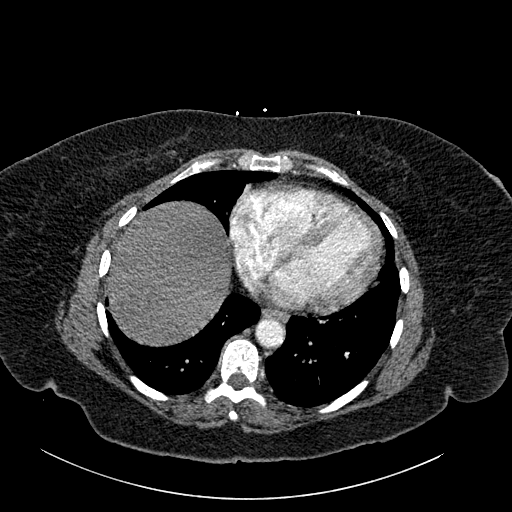
[im 107/238  lung]
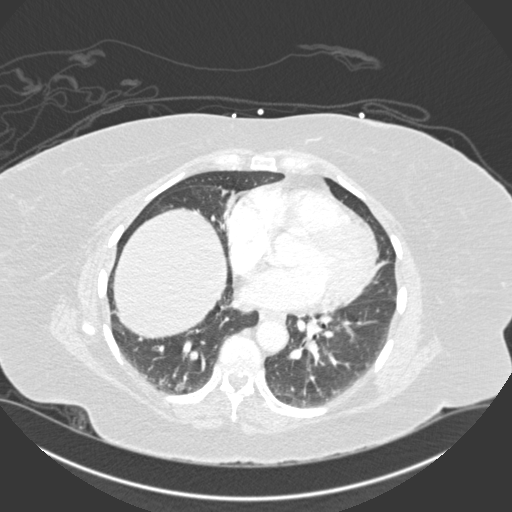
[im 110/238  mediastinal]
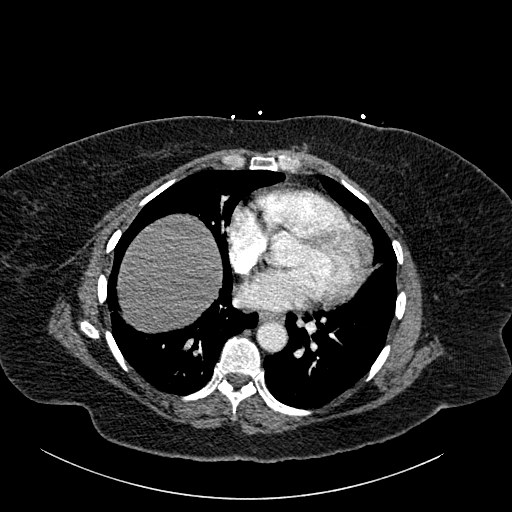
[im 119/238  lung]
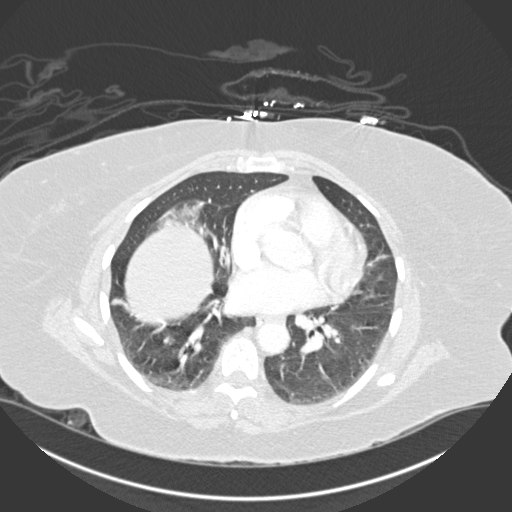
[im 131/238  mediastinal]
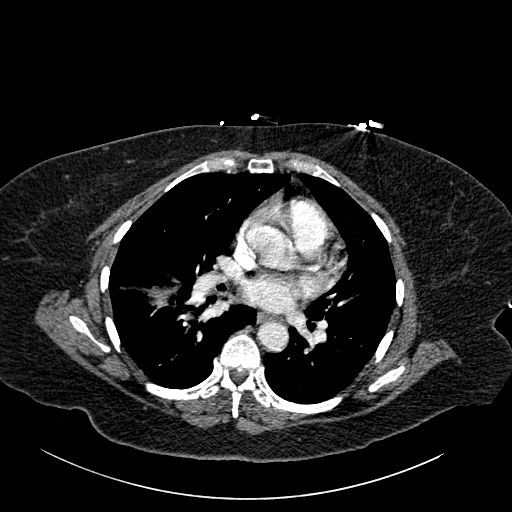
[im 143/238  lung]
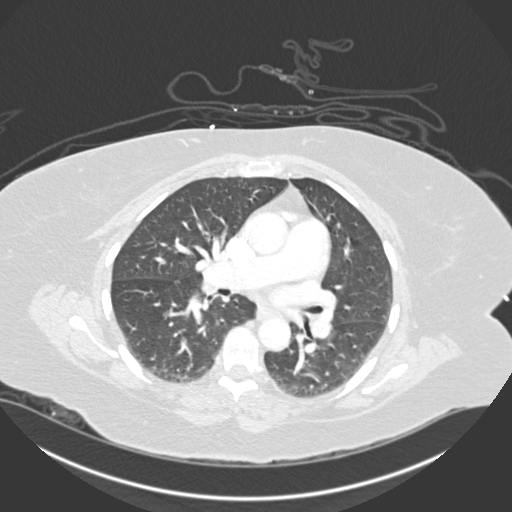
[im 155/238  mediastinal]
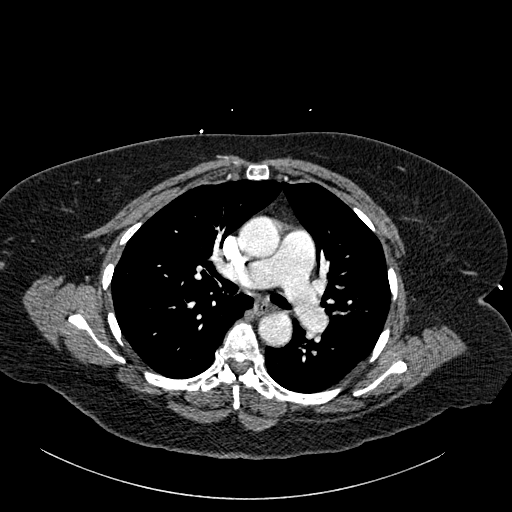
[im 159/238  lung]
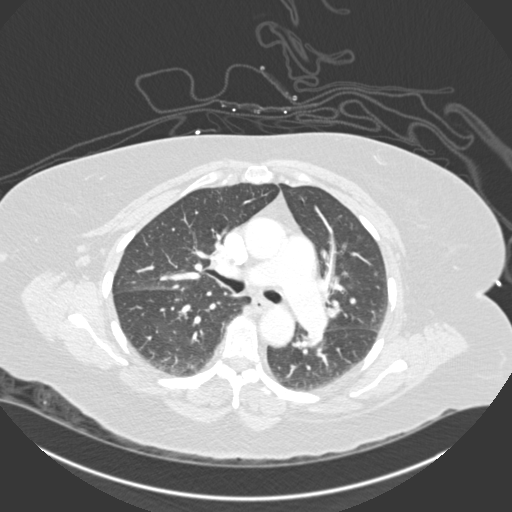
[im 178/238  mediastinal]
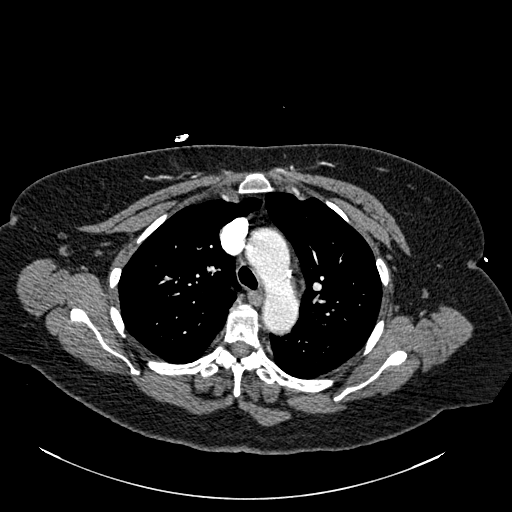
[im 190/238  lung]
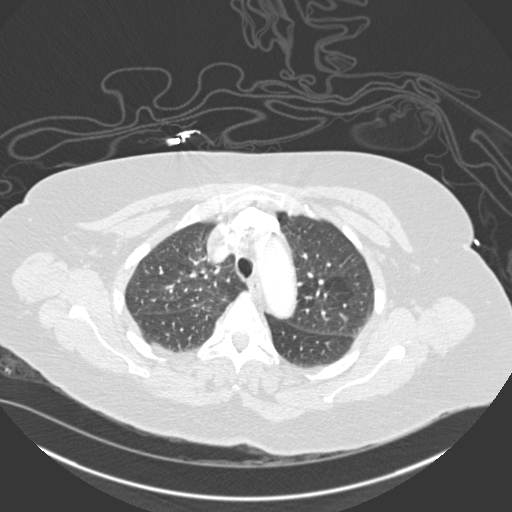
[im 202/238  mediastinal]
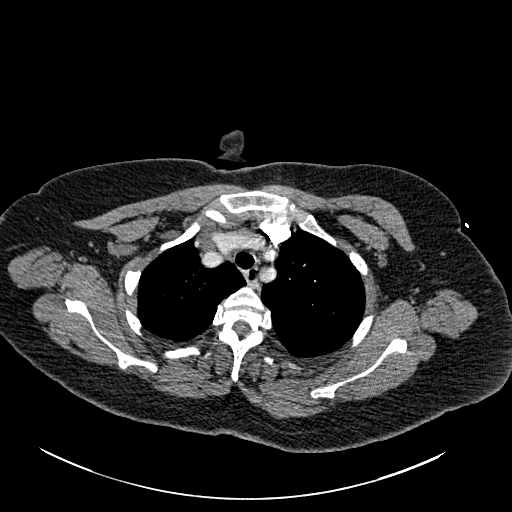
[im 214/238  lung]
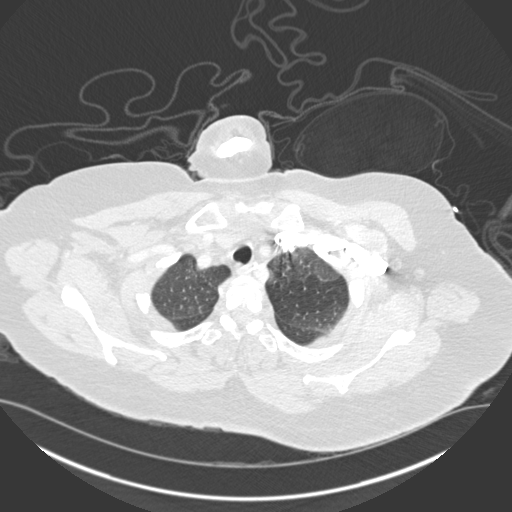
[im 226/238  mediastinal]
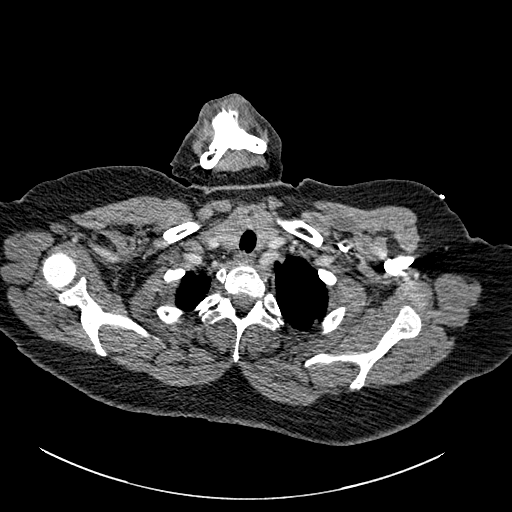

[20 of 32 positions shown; findings below may reference images not displayed]

FINDINGS: No pulmonary embolus is identified. Heart size is normal. No
axillary, hilar or mediastinal lymphadenopathy. No pleural or
pericardial effusion. The lungs demonstrate only mild dependent
atelectasis. Visualized upper abdomen shows fatty infiltration of
the liver. No focal bony abnormality is identified.

Review of the MIP images confirms the above findings.
IMPRESSION: Negative for pulmonary embolus or acute disease.

Fatty infiltration of the liver.

## 2014-01-06 ENCOUNTER — Other Ambulatory Visit: Payer: Self-pay

## 2014-01-06 DIAGNOSIS — Z1231 Encounter for screening mammogram for malignant neoplasm of breast: Secondary | ICD-10-CM

## 2014-01-09 ENCOUNTER — Emergency Department (HOSPITAL_COMMUNITY): Payer: Medicare Other

## 2014-01-09 ENCOUNTER — Encounter (HOSPITAL_COMMUNITY): Payer: Self-pay | Admitting: Emergency Medicine

## 2014-01-09 ENCOUNTER — Emergency Department (HOSPITAL_COMMUNITY)
Admission: EM | Admit: 2014-01-09 | Discharge: 2014-01-09 | Disposition: A | Discharge status: Home / Self Care | Payer: Medicare Other | Attending: Emergency Medicine | Admitting: Emergency Medicine

## 2014-01-09 DIAGNOSIS — Z8659 Personal history of other mental and behavioral disorders: Secondary | ICD-10-CM | POA: Diagnosis not present

## 2014-01-09 DIAGNOSIS — Z79899 Other long term (current) drug therapy: Secondary | ICD-10-CM | POA: Insufficient documentation

## 2014-01-09 DIAGNOSIS — Z9889 Other specified postprocedural states: Secondary | ICD-10-CM | POA: Insufficient documentation

## 2014-01-09 DIAGNOSIS — E669 Obesity, unspecified: Secondary | ICD-10-CM | POA: Diagnosis not present

## 2014-01-09 DIAGNOSIS — E785 Hyperlipidemia, unspecified: Secondary | ICD-10-CM | POA: Insufficient documentation

## 2014-01-09 DIAGNOSIS — I10 Essential (primary) hypertension: Secondary | ICD-10-CM | POA: Diagnosis not present

## 2014-01-09 DIAGNOSIS — Z905 Acquired absence of kidney: Secondary | ICD-10-CM | POA: Diagnosis not present

## 2014-01-09 DIAGNOSIS — R109 Unspecified abdominal pain: Secondary | ICD-10-CM | POA: Diagnosis not present

## 2014-01-09 DIAGNOSIS — J45909 Unspecified asthma, uncomplicated: Secondary | ICD-10-CM | POA: Diagnosis not present

## 2014-01-09 DIAGNOSIS — R011 Cardiac murmur, unspecified: Secondary | ICD-10-CM | POA: Diagnosis not present

## 2014-01-09 DIAGNOSIS — K219 Gastro-esophageal reflux disease without esophagitis: Secondary | ICD-10-CM | POA: Insufficient documentation

## 2014-01-09 DIAGNOSIS — Z87442 Personal history of urinary calculi: Secondary | ICD-10-CM | POA: Diagnosis not present

## 2014-01-09 DIAGNOSIS — Z85528 Personal history of other malignant neoplasm of kidney: Secondary | ICD-10-CM | POA: Diagnosis not present

## 2014-01-09 DIAGNOSIS — Z87440 Personal history of urinary (tract) infections: Secondary | ICD-10-CM | POA: Diagnosis not present

## 2014-01-09 DIAGNOSIS — M1990 Unspecified osteoarthritis, unspecified site: Secondary | ICD-10-CM | POA: Diagnosis not present

## 2014-01-09 DIAGNOSIS — D649 Anemia, unspecified: Secondary | ICD-10-CM | POA: Insufficient documentation

## 2014-01-09 LAB — COMPREHENSIVE METABOLIC PANEL
ALT: 21 U/L (ref 0–35)
AST: 22 U/L (ref 0–37)
Albumin: 3.7 g/dL (ref 3.5–5.2)
Alkaline Phosphatase: 77 U/L (ref 39–117)
Anion gap: 14 (ref 5–15)
BUN: 14 mg/dL (ref 6–23)
CO2: 19 mEq/L (ref 19–32)
Calcium: 9.5 mg/dL (ref 8.4–10.5)
Chloride: 110 mEq/L (ref 96–112)
Creatinine, Ser: 1.16 mg/dL — ABNORMAL HIGH (ref 0.50–1.10)
GFR calc Af Amer: 55 mL/min — ABNORMAL LOW (ref >90)
GFR calc non Af Amer: 47 mL/min — ABNORMAL LOW (ref >90)
Glucose, Bld: 102 mg/dL — ABNORMAL HIGH (ref 70–99)
Potassium: 4.1 mEq/L (ref 3.7–5.3)
Sodium: 143 mEq/L (ref 137–147)
Total Bilirubin: 0.9 mg/dL (ref 0.3–1.2)
Total Protein: 7.1 g/dL (ref 6.0–8.3)

## 2014-01-09 LAB — URINALYSIS, ROUTINE W REFLEX MICROSCOPIC
Bilirubin Urine: NEGATIVE
Glucose, UA: NEGATIVE mg/dL
Hgb urine dipstick: NEGATIVE
Ketones, ur: NEGATIVE mg/dL
Leukocytes, UA: NEGATIVE
Nitrite: NEGATIVE
Protein, ur: NEGATIVE mg/dL
Specific Gravity, Urine: 1.017 (ref 1.005–1.030)
Urobilinogen, UA: 1 mg/dL (ref 0.0–1.0)
pH: 6 (ref 5.0–8.0)

## 2014-01-09 LAB — CBC WITH DIFFERENTIAL/PLATELET
Basophils Absolute: 0 10*3/uL (ref 0.0–0.1)
Basophils Relative: 1 % (ref 0–1)
Eosinophils Absolute: 0.2 10*3/uL (ref 0.0–0.7)
Eosinophils Relative: 4 % (ref 0–5)
HCT: 32.5 % — ABNORMAL LOW (ref 36.0–46.0)
Hemoglobin: 10.2 g/dL — ABNORMAL LOW (ref 12.0–15.0)
Lymphocytes Relative: 30 % (ref 12–46)
Lymphs Abs: 1.3 10*3/uL (ref 0.7–4.0)
MCH: 26.2 pg (ref 26.0–34.0)
MCHC: 31.4 g/dL (ref 30.0–36.0)
MCV: 83.5 fL (ref 78.0–100.0)
Monocytes Absolute: 0.4 10*3/uL (ref 0.1–1.0)
Monocytes Relative: 10 % (ref 3–12)
Neutro Abs: 2.3 10*3/uL (ref 1.7–7.7)
Neutrophils Relative %: 55 % (ref 43–77)
Platelets: 315 10*3/uL (ref 150–400)
RBC: 3.89 MIL/uL (ref 3.87–5.11)
RDW: 15 % (ref 11.5–15.5)
WBC: 4.2 10*3/uL (ref 4.0–10.5)

## 2014-01-09 LAB — LIPASE, BLOOD: Lipase: 30 U/L (ref 11–59)

## 2014-01-09 MED ORDER — OXYCODONE-ACETAMINOPHEN 5-325 MG PO TABS: 1.0000 | ORAL_TABLET | ORAL | Status: DC | PRN

## 2014-01-09 MED ORDER — OXYCODONE-ACETAMINOPHEN 5-325 MG PO TABS
2.0000 | ORAL_TABLET | Freq: Once | ORAL | Status: AC
  Administered 2014-01-09: 2 via ORAL
  Filled 2014-01-09: qty 2

## 2014-01-09 NOTE — Discharge Instructions (Signed)
Abdominal Pain, Women °Abdominal (stomach, pelvic, or belly) pain can be caused by many things. It is important to tell your doctor: °· The location of the pain. °· Does it come and go or is it present all the time? °· Are there things that start the pain (eating certain foods, exercise)? °· Are there other symptoms associated with the pain (fever, nausea, vomiting, diarrhea)? °All of this is helpful to know when trying to find the cause of the pain. °CAUSES  °· Stomach: virus or bacteria infection, or ulcer. °· Intestine: appendicitis (inflamed appendix), regional ileitis (Crohn's disease), ulcerative colitis (inflamed colon), irritable bowel syndrome, diverticulitis (inflamed diverticulum of the colon), or cancer of the stomach or intestine. °· Gallbladder disease or stones in the gallbladder. °· Kidney disease, kidney stones, or infection. °· Pancreas infection or cancer. °· Fibromyalgia (pain disorder). °· Diseases of the female organs: °¨ Uterus: fibroid (non-cancerous) tumors or infection. °¨ Fallopian tubes: infection or tubal pregnancy. °¨ Ovary: cysts or tumors. °¨ Pelvic adhesions (scar tissue). °¨ Endometriosis (uterus lining tissue growing in the pelvis and on the pelvic organs). °¨ Pelvic congestion syndrome (female organs filling up with blood just before the menstrual period). °¨ Pain with the menstrual period. °¨ Pain with ovulation (producing an egg). °¨ Pain with an IUD (intrauterine device, birth control) in the uterus. °¨ Cancer of the female organs. °· Functional pain (pain not caused by a disease, may improve without treatment). °· Psychological pain. °· Depression. °DIAGNOSIS  °Your doctor will decide the seriousness of your pain by doing an examination. °· Blood tests. °· X-rays. °· Ultrasound. °· CT scan (computed tomography, special type of X-ray). °· MRI (magnetic resonance imaging). °· Cultures, for infection. °· Barium enema (dye inserted in the large intestine, to better view it with  X-rays). °· Colonoscopy (looking in intestine with a lighted tube). °· Laparoscopy (minor surgery, looking in abdomen with a lighted tube). °· Major abdominal exploratory surgery (looking in abdomen with a large incision). °TREATMENT  °The treatment will depend on the cause of the pain.  °· Many cases can be observed and treated at home. °· Over-the-counter medicines recommended by your caregiver. °· Prescription medicine. °· Antibiotics, for infection. °· Birth control pills, for painful periods or for ovulation pain. °· Hormone treatment, for endometriosis. °· Nerve blocking injections. °· Physical therapy. °· Antidepressants. °· Counseling with a psychologist or psychiatrist. °· Minor or major surgery. °HOME CARE INSTRUCTIONS  °· Do not take laxatives, unless directed by your caregiver. °· Take over-the-counter pain medicine only if ordered by your caregiver. Do not take aspirin because it can cause an upset stomach or bleeding. °· Try a clear liquid diet (broth or water) as ordered by your caregiver. Slowly move to a bland diet, as tolerated, if the pain is related to the stomach or intestine. °· Have a thermometer and take your temperature several times a day, and record it. °· Bed rest and sleep, if it helps the pain. °· Avoid sexual intercourse, if it causes pain. °· Avoid stressful situations. °· Keep your follow-up appointments and tests, as your caregiver orders. °· If the pain does not go away with medicine or surgery, you may try: °¨ Acupuncture. °¨ Relaxation exercises (yoga, meditation). °¨ Group therapy. °¨ Counseling. °SEEK MEDICAL CARE IF:  °· You notice certain foods cause stomach pain. °· Your home care treatment is not helping your pain. °· You need stronger pain medicine. °· You want your IUD removed. °· You feel faint or   lightheaded. °· You develop nausea and vomiting. °· You develop a rash. °· You are having side effects or an allergy to your medicine. °SEEK IMMEDIATE MEDICAL CARE IF:  °· Your  pain does not go away or gets worse. °· You have a fever. °· Your pain is felt only in portions of the abdomen. The right side could possibly be appendicitis. The left lower portion of the abdomen could be colitis or diverticulitis. °· You are passing blood in your stools (bright red or black tarry stools, with or without vomiting). °· You have blood in your urine. °· You develop chills, with or without a fever. °· You pass out. °MAKE SURE YOU:  °· Understand these instructions. °· Will watch your condition. °· Will get help right away if you are not doing well or get worse. °Document Released: 04/01/2007 Document Revised: 10/19/2013 Document Reviewed: 04/21/2009 °Exit[employer]® Patient Information ©2015 [employer]. This information is not intended to replace advice given to you by your health care provider. Make sure you discuss any questions you have with your health care provider. ° °

## 2014-01-09 NOTE — ED Notes (Signed)
Attempted to place IV-patient unable to tolerate stick-unable to stay still-will attempt at later time

## 2014-01-09 NOTE — ED Notes (Signed)
Pt w/ hx of kidney cancer.  States that her rt kidney was removed in the 90s. C/o lt flank, lt sided abd pain. Asked several times about how long this pain has been going on, pt states "a while".  When asked how long "a while" is, pt states, "a long time".  When asked specifically, in numerical value, how long she has pain, pt again states "a while".  Pt states that she has been incontinent of urine x 3 wks.  Also states that she was on lasix but stopped it on her own about 2 wks ago because "she figured it was hurting her kidney".

## 2014-01-09 NOTE — ED Notes (Signed)
Patient transported to CT 

## 2014-01-13 NOTE — ED Provider Notes (Signed)
CSN: [id number]     Arrival date & time 01/09/14  0920 History   First MD Initiated Contact with Patient 01/09/14 [phone]     Chief Complaint  Patient presents with  . Abdominal Pain  . Flank Pain     (Consider location/radiation/quality/duration/timing/severity/associated sxs/prior Treatment) HPI  68 year-old female with multiple complaints. Her chief complaint seems to be left abdominal/left flank pain. Patient is vague in her description. She has a history of fibromyalgia she is unsure of her symptoms were from this or from something else. She is concerned about her kidney function and the fact that she is status post nephrectomy although this was numerous years ago. Patient is having a hard time giving me any type of time With regards to her symptom onset or exact characterization. Has had some dribbling of urine over the past couple weeks intermittently, otherwise no acute urinary complaints. She elected to take herself off of her prescribed Lasix she is concerned about her kidney function. She has maybe had some worsening lower extremity swelling since then. No fevers or chills.  Past Medical History  Diagnosis Date  . Chest pain   . Cardiomyopathy   . HTN (hypertension)   . Takotsubo cardiomyopathy   . HLD (hyperlipidemia)   . Asthma   . Fibromyalgia   . DJD (degenerative joint disease)   . Chronic anemia   . Obesity   . Carotid bruit     left  . Psychosis   . Lumbar herniated disc   . UTI (lower urinary tract infection)   . Schizophrenia   . Cancer     renal cell carcinoma  . Heart murmur   . Single kidney   . History of kidney cancer     unknown type. had R kidney removed b/c of CA.   Marland Kitchen Kidney stones   . GERD (gastroesophageal reflux disease)     on rare occas. - uses peptobismol   Past Surgical History  Procedure Laterality Date  . Nephrectomy  1990's    right, secondary to cyst  . Breast lumpectomy  90's    left, cyst  . Cystectomy  90's    back (left)  .  Total knee arthroplasty  2005    right  . Cesarean section  1980    x1  . Cardiac catheterization  2010    no intervention  . Total knee arthroplasty Left 03/25/2013    Procedure: TOTAL KNEE ARTHROPLASTY;  Surgeon: Alta Corning, MD;  Location: [facility];  Service: Orthopedics;  Laterality: Left;   Family History  Problem Relation Age of Onset  . Coronary artery disease Mother 42  . Heart failure Mother     congestive  . Stroke Mother   . Brain cancer Father 89  . Hypertension      siblings  . Aortic stenosis      sibling   History  Substance Use Topics  . Smoking status: Never Smoker   . Smokeless tobacco: Not on file  . Alcohol Use: No     Comment: rare   OB History   Grav Para Term Preterm Abortions TAB SAB Ect Mult Living                 Review of Systems All systems reviewed and negative, other than as noted in HPI.   Allergies  Bee venom; Other; Avelox; and Nsaids  Home Medications   Prior to Admission medications   Medication Sig Start Date End Date Taking?  Authorizing Provider  albuterol (PROVENTIL HFA;VENTOLIN HFA) 108 (90 BASE) MCG/ACT inhaler Inhale 2 puffs into the lungs 5 (five) times daily as needed for wheezing or shortness of breath.   Yes Historical Provider, MD  albuterol (PROVENTIL) (2.5 MG/3ML) 0.083% nebulizer solution Take 2.5 mg by nebulization every 4 (four) hours as needed for wheezing or shortness of breath.   Yes Historical Provider, MD  amLODipine (NORVASC) 5 MG tablet Take 1 tablet (5 mg total) by mouth daily with breakfast. 10/08/13  Yes Benjamine Mola, FNP  carvedilol (COREG) 25 MG tablet Take 1 tablet (25 mg total) by mouth 2 (two) times daily with a meal. 10/08/13  Yes Benjamine Mola, FNP  Cholecalciferol (VITAMIN D3) 5000 UNITS CAPS Take 10,000 Units by mouth daily with lunch.   Yes Historical Provider, MD  diclofenac sodium (VOLTAREN) 1 % GEL Apply 2 g topically every 4 (four) hours as needed (pain).   Yes Historical Provider, MD   EPINEPHrine (EPIPEN) 0.3 mg/0.3 mL IJ SOAJ injection Inject 0.3 mg into the muscle as needed (allergic reaction).   Yes Historical Provider, MD  folic acid (FOLVITE) 1 MG tablet Take 1 mg by mouth daily.   Yes Historical Provider, MD  furosemide (LASIX) 40 MG tablet Take 40 mg by mouth daily.   Yes Historical Provider, MD  levocetirizine (XYZAL) 5 MG tablet Take 5 mg by mouth every evening.   Yes Historical Provider, MD  mirabegron ER (MYRBETRIQ) 25 MG TB24 tablet Take 25 mg by mouth daily.   Yes Historical Provider, MD  mometasone-formoterol (DULERA) 100-5 MCG/ACT AERO Inhale 2 puffs into the lungs 2 (two) times daily. 10/08/13  Yes Benjamine Mola, FNP  Multiple Vitamin (MULTIVITAMIN WITH MINERALS) TABS tablet Take 1 tablet by mouth daily with lunch.   Yes Historical Provider, MD  neomycin-bacitracin-polymyxin (NEOSPORIN) [phone] ointment Apply 1 application topically as needed (rash under breast).   Yes Historical Provider, MD  Omega-3 Fatty Acids (FISH OIL) 300 MG CAPS Take 300 mg by mouth 2 (two) times daily.   Yes Historical Provider, MD  omeprazole (PRILOSEC) 20 MG capsule Take 20 mg by mouth 2 (two) times daily before a meal.   Yes Historical Provider, MD  ondansetron (ZOFRAN-ODT) 4 MG disintegrating tablet Take 4 mg by mouth every 6 (six) hours as needed for nausea or vomiting.   Yes Historical Provider, MD  Papaya TABS Take 4 tablets by mouth daily with breakfast.   Yes Historical Provider, MD  polyethylene glycol (MIRALAX / GLYCOLAX) packet Take 17 g by mouth daily as needed (constipation). Mix in 8 oz liquid and drink   Yes Historical Provider, MD  potassium chloride SA (K-DUR,KLOR-CON) 20 MEQ tablet Take 2 tablets (40 mEq total) by mouth daily. 10/08/13  Yes Benjamine Mola, FNP  pravastatin (PRAVACHOL) 40 MG tablet Take 1 tablet (40 mg total) by mouth daily after supper. 10/08/13  Yes Benjamine Mola, FNP  tiZANidine (ZANAFLEX) 4 MG tablet Take 4 mg by mouth every 8 (eight) hours as  needed for muscle spasms.   Yes Historical Provider, MD  traMADol (ULTRAM) 50 MG tablet Take 50 mg by mouth 3 (three) times daily as needed (pain).   Yes Historical Provider, MD  nefazodone (SERZONE) 150 MG tablet Take 150 mg by mouth at bedtime.    Historical Provider, MD  oxyCODONE-acetaminophen (PERCOCET/ROXICET) 5-325 MG per tablet Take 1 tablet by mouth every 4 (four) hours as needed for severe pain. 01/09/14   Virgel Manifold, MD  BP 137/72  Pulse 69  Temp(Src) 98.2 F (36.8 C) (Oral)  Resp 17  SpO2 100% Physical Exam  Nursing note and vitals reviewed. Constitutional: She appears well-developed and well-nourished. No distress.  HENT:  Head: Normocephalic and atraumatic.  Eyes: Conjunctivae are normal. Right eye exhibits no discharge. Left eye exhibits no discharge.  Neck: Neck supple.  Cardiovascular: Normal rate, regular rhythm and normal heart sounds.  Exam reveals no gallop and no friction rub.   No murmur heard. Pulmonary/Chest: Effort normal and breath sounds normal. No respiratory distress.  Abdominal: Soft. She exhibits no distension. There is no tenderness.  Genitourinary:  Left CVA tenderness?  Musculoskeletal: She exhibits no edema and no tenderness.  Neurological: She is alert.  Skin: Skin is warm and dry.  Psychiatric: She has a normal mood and affect. Her behavior is normal. Thought content normal.    ED Course  Procedures (including critical care time) Labs Review Labs Reviewed  CBC WITH DIFFERENTIAL - Abnormal; Notable for the following:    Hemoglobin 10.2 (*)    HCT 32.5 (*)    All other components within normal limits  COMPREHENSIVE METABOLIC PANEL - Abnormal; Notable for the following:    Glucose, Bld 102 (*)    Creatinine, Ser 1.16 (*)    GFR calc non Af Amer 47 (*)    GFR calc Af Amer 55 (*)    All other components within normal limits  URINALYSIS, ROUTINE W REFLEX MICROSCOPIC - Abnormal; Notable for the following:    APPearance CLOUDY (*)    All  other components within normal limits  LIPASE, BLOOD    Imaging Review No results found.  Ct Abdomen Pelvis Wo Contrast  01/09/2014   CLINICAL DATA:  Lower abdominal pain for the past 2 days. History of prior right nephrectomy (1999) for malignancy.  EXAM: CT ABDOMEN AND PELVIS WITHOUT CONTRAST  TECHNIQUE: Multidetector CT imaging of the abdomen and pelvis was performed following the standard protocol without IV contrast.  COMPARISON:  CT of the abdomen and pelvis 06/04/2009.  FINDINGS: Lung Bases: Areas of atelectasis and/or scarring in the lung bases bilaterally.  Abdomen/Pelvis: Status post right nephrectomy. The unenhanced appearance of the liver, gallbladder, pancreas, spleen and bilateral adrenal glands is unremarkable. In the lower pole collecting system of the left kidney there is a 4 mm nonobstructive calculus. No additional calculi are identified along the course of the left ureter or within the lumen of the urinary bladder. No hydroureteronephrosis or perinephric stranding to suggest urinary tract obstruction at this time.  Normal appendix. A few scattered colonic diverticulae are noted, without surrounding inflammatory changes to suggest an acute diverticulitis at this time. No significant volume of ascites. No pneumoperitoneum. No pathologic distention of small bowel. No lymphadenopathy identified within the abdomen or pelvis on today's non contrast CT examination. Atherosclerosis throughout the abdominal and pelvic vasculature, without evidence of aneurysm. Numerous coarse calcifications within the uterus, compatible with small fibroids. Ovaries are unremarkable in appearance on today's non contrast CT examination. Urinary bladder is normal in appearance.  Musculoskeletal: There are no aggressive appearing lytic or blastic lesions noted in the visualized portions of the skeleton.  IMPRESSION: 1. No acute findings in the abdomen or pelvis to account for the patient's symptoms. 2. Normal  appendix. 3. Colonic diverticulosis without evidence to suggest acute diverticulitis at this time. 4. 4 mm nonobstructive calculus in the lower pole collecting system of the left kidney. 5. Status post right nephrectomy.   Electronically Signed  By: Vinnie Langton M.D.   On: 01/09/2014 12:02    EKG Interpretation None      MDM   Final diagnoses:  Abdominal pain, unspecified abdominal location    [age]-year-old female with abdominal pain. Patient with a somewhat odd affect and aside from her abdominal pain she actually has multiple other complaints. Very vague in her description of it. Sestamibi pretty chronic in nature as well. Very low suspicion for emergent process at this time. Workup was pretty unremarkable. Patient very concerned about her kidney function, particularly with her past history. This currently appears to be stable to minimally improved in comparison to prior records. She seemed relieved when hearing this.    Virgel Manifold, MD 01/13/14 1430

## 2014-01-21 ENCOUNTER — Ambulatory Visit: Payer: Self-pay

## 2014-02-02 ENCOUNTER — Ambulatory Visit: Payer: Self-pay

## 2014-03-01 ENCOUNTER — Emergency Department (HOSPITAL_COMMUNITY): Admission: EM | Admit: 2014-03-01 | Payer: Medicare Other | Source: Home / Self Care

## 2014-03-01 ENCOUNTER — Encounter (HOSPITAL_COMMUNITY): Payer: Self-pay | Admitting: Emergency Medicine

## 2014-03-01 NOTE — ED Notes (Signed)
Bed: WA06 Expected date:  Expected time:  Means of arrival:  Comments: 

## 2014-03-01 NOTE — ED Notes (Signed)
EMS called to neighbors house.  Found patient with multiple complaints.  Main complaint being a Headache with nausea.  Currently patient rates her pain 10 of 10.

## 2014-03-01 NOTE — ED Notes (Signed)
Patient refused any assessment to be done by staff. She requested to be transferred to [facility]

## 2014-03-03 ENCOUNTER — Emergency Department (HOSPITAL_COMMUNITY): Payer: Medicare Other

## 2014-03-03 ENCOUNTER — Encounter (HOSPITAL_COMMUNITY): Payer: Self-pay | Admitting: Emergency Medicine

## 2014-03-03 ENCOUNTER — Emergency Department (HOSPITAL_COMMUNITY)
Admission: EM | Admit: 2014-03-03 | Discharge: 2014-03-08 | Disposition: A | Discharge status: Home / Self Care | Payer: Medicare Other | Attending: Emergency Medicine | Admitting: Emergency Medicine

## 2014-03-03 DIAGNOSIS — Z862 Personal history of diseases of the blood and blood-forming organs and certain disorders involving the immune mechanism: Secondary | ICD-10-CM | POA: Insufficient documentation

## 2014-03-03 DIAGNOSIS — Z85528 Personal history of other malignant neoplasm of kidney: Secondary | ICD-10-CM | POA: Diagnosis not present

## 2014-03-03 DIAGNOSIS — R011 Cardiac murmur, unspecified: Secondary | ICD-10-CM | POA: Diagnosis not present

## 2014-03-03 DIAGNOSIS — Z87440 Personal history of urinary (tract) infections: Secondary | ICD-10-CM | POA: Diagnosis not present

## 2014-03-03 DIAGNOSIS — Z79899 Other long term (current) drug therapy: Secondary | ICD-10-CM | POA: Insufficient documentation

## 2014-03-03 DIAGNOSIS — Z792 Long term (current) use of antibiotics: Secondary | ICD-10-CM | POA: Diagnosis not present

## 2014-03-03 DIAGNOSIS — R079 Chest pain, unspecified: Secondary | ICD-10-CM | POA: Insufficient documentation

## 2014-03-03 DIAGNOSIS — J45909 Unspecified asthma, uncomplicated: Secondary | ICD-10-CM | POA: Insufficient documentation

## 2014-03-03 DIAGNOSIS — Z87442 Personal history of urinary calculi: Secondary | ICD-10-CM | POA: Insufficient documentation

## 2014-03-03 DIAGNOSIS — E669 Obesity, unspecified: Secondary | ICD-10-CM | POA: Diagnosis not present

## 2014-03-03 DIAGNOSIS — F310 Bipolar disorder, current episode hypomanic: Secondary | ICD-10-CM

## 2014-03-03 DIAGNOSIS — K219 Gastro-esophageal reflux disease without esophagitis: Secondary | ICD-10-CM | POA: Diagnosis not present

## 2014-03-03 DIAGNOSIS — Z791 Long term (current) use of non-steroidal anti-inflammatories (NSAID): Secondary | ICD-10-CM | POA: Diagnosis not present

## 2014-03-03 DIAGNOSIS — F29 Unspecified psychosis not due to a substance or known physiological condition: Secondary | ICD-10-CM | POA: Insufficient documentation

## 2014-03-03 DIAGNOSIS — Z905 Acquired absence of kidney: Secondary | ICD-10-CM | POA: Insufficient documentation

## 2014-03-03 DIAGNOSIS — F319 Bipolar disorder, unspecified: Secondary | ICD-10-CM | POA: Insufficient documentation

## 2014-03-03 DIAGNOSIS — IMO0002 Reserved for concepts with insufficient information to code with codable children: Secondary | ICD-10-CM | POA: Diagnosis not present

## 2014-03-03 DIAGNOSIS — R000 Tachycardia, unspecified: Secondary | ICD-10-CM | POA: Insufficient documentation

## 2014-03-03 DIAGNOSIS — Z8739 Personal history of other diseases of the musculoskeletal system and connective tissue: Secondary | ICD-10-CM | POA: Insufficient documentation

## 2014-03-03 DIAGNOSIS — F1110 Opioid abuse, uncomplicated: Secondary | ICD-10-CM | POA: Diagnosis not present

## 2014-03-03 DIAGNOSIS — Z9889 Other specified postprocedural states: Secondary | ICD-10-CM | POA: Diagnosis not present

## 2014-03-03 DIAGNOSIS — I10 Essential (primary) hypertension: Secondary | ICD-10-CM | POA: Diagnosis not present

## 2014-03-03 HISTORY — DX: Bipolar disorder, unspecified: F31.9

## 2014-03-03 LAB — BASIC METABOLIC PANEL
Anion gap: 16 — ABNORMAL HIGH (ref 5–15)
BUN: 23 mg/dL (ref 6–23)
CALCIUM: 9.5 mg/dL (ref 8.4–10.5)
CO2: 18 mEq/L — ABNORMAL LOW (ref 19–32)
CREATININE: 1.15 mg/dL — AB (ref 0.50–1.10)
Chloride: 102 mEq/L (ref 96–112)
GFR calc Af Amer: 55 mL/min — ABNORMAL LOW (ref >90)
GFR, EST NON AFRICAN AMERICAN: 48 mL/min — AB (ref >90)
Glucose, Bld: 96 mg/dL (ref 70–99)
Potassium: 4.2 mEq/L (ref 3.7–5.3)
Sodium: 136 mEq/L — ABNORMAL LOW (ref 137–147)

## 2014-03-03 LAB — URINALYSIS, ROUTINE W REFLEX MICROSCOPIC
Bilirubin Urine: NEGATIVE
GLUCOSE, UA: NEGATIVE mg/dL
HGB URINE DIPSTICK: NEGATIVE
Ketones, ur: 15 mg/dL — AB
Leukocytes, UA: NEGATIVE
Nitrite: NEGATIVE
PH: 5 (ref 5.0–8.0)
Protein, ur: NEGATIVE mg/dL
SPECIFIC GRAVITY, URINE: 1.012 (ref 1.005–1.030)
UROBILINOGEN UA: 0.2 mg/dL (ref 0.0–1.0)

## 2014-03-03 LAB — CBC WITH DIFFERENTIAL/PLATELET
Basophils Absolute: 0 10*3/uL (ref 0.0–0.1)
Basophils Relative: 1 % (ref 0–1)
EOS ABS: 0.3 10*3/uL (ref 0.0–0.7)
EOS PCT: 4 % (ref 0–5)
HCT: 33.9 % — ABNORMAL LOW (ref 36.0–46.0)
HEMOGLOBIN: 10.8 g/dL — AB (ref 12.0–15.0)
Lymphocytes Relative: 19 % (ref 12–46)
Lymphs Abs: 1.3 10*3/uL (ref 0.7–4.0)
MCH: 25.8 pg — AB (ref 26.0–34.0)
MCHC: 31.9 g/dL (ref 30.0–36.0)
MCV: 81.1 fL (ref 78.0–100.0)
MONOS PCT: 11 % (ref 3–12)
Monocytes Absolute: 0.7 10*3/uL (ref 0.1–1.0)
Neutro Abs: 4.3 10*3/uL (ref 1.7–7.7)
Neutrophils Relative %: 65 % (ref 43–77)
PLATELETS: 266 10*3/uL (ref 150–400)
RBC: 4.18 MIL/uL (ref 3.87–5.11)
RDW: 13.8 % (ref 11.5–15.5)
WBC: 6.5 10*3/uL (ref 4.0–10.5)

## 2014-03-03 LAB — RAPID URINE DRUG SCREEN, HOSP PERFORMED
AMPHETAMINES: NOT DETECTED
BENZODIAZEPINES: NOT DETECTED
Barbiturates: NOT DETECTED
Cocaine: NOT DETECTED
OPIATES: POSITIVE — AB
Tetrahydrocannabinol: NOT DETECTED

## 2014-03-03 LAB — ETHANOL

## 2014-03-03 LAB — PRO B NATRIURETIC PEPTIDE: Pro B Natriuretic peptide (BNP): 71.8 pg/mL (ref 0–125)

## 2014-03-03 LAB — I-STAT TROPONIN, ED
Troponin i, poc: 0 ng/mL (ref 0.00–0.08)
Troponin i, poc: 0 ng/mL (ref 0.00–0.08)

## 2014-03-03 MED ORDER — ONDANSETRON 4 MG PO TBDP: 4.0000 mg | ORAL_TABLET | Freq: Four times a day (QID) | ORAL | Status: DC | PRN

## 2014-03-03 MED ORDER — NITROGLYCERIN 0.4 MG SL SUBL
0.4000 mg | SUBLINGUAL_TABLET | SUBLINGUAL | Status: DC | PRN
  Filled 2014-03-03: qty 1

## 2014-03-03 MED ORDER — ALBUTEROL SULFATE (2.5 MG/3ML) 0.083% IN NEBU: 2.5000 mg | INHALATION_SOLUTION | RESPIRATORY_TRACT | Status: DC | PRN

## 2014-03-03 MED ORDER — SIMVASTATIN 20 MG PO TABS
20.0000 mg | ORAL_TABLET | Freq: Every day | ORAL | Status: DC
  Administered 2014-03-04 – 2014-03-07 (×4): 20 mg via ORAL
  Filled 2014-03-03 (×6): qty 1

## 2014-03-03 MED ORDER — MIRABEGRON ER 25 MG PO TB24
25.0000 mg | ORAL_TABLET | Freq: Every day | ORAL | Status: DC
  Administered 2014-03-04 – 2014-03-08 (×5): 25 mg via ORAL
  Filled 2014-03-03 (×6): qty 1

## 2014-03-03 MED ORDER — POLYETHYLENE GLYCOL 3350 17 G PO PACK
17.0000 g | PACK | Freq: Every day | ORAL | Status: DC | PRN
  Administered 2014-03-05 – 2014-03-06 (×2): 17 g via ORAL
  Filled 2014-03-03 (×3): qty 1

## 2014-03-03 MED ORDER — STERILE WATER FOR INJECTION IJ SOLN
INTRAMUSCULAR | Status: AC
  Administered 2014-03-03: 10 mL
  Filled 2014-03-03: qty 10

## 2014-03-03 MED ORDER — LORATADINE 10 MG PO TABS
10.0000 mg | ORAL_TABLET | Freq: Every evening | ORAL | Status: DC
  Administered 2014-03-04 – 2014-03-07 (×5): 10 mg via ORAL
  Filled 2014-03-03 (×7): qty 1

## 2014-03-03 MED ORDER — ADULT MULTIVITAMIN W/MINERALS CH
1.0000 | ORAL_TABLET | Freq: Every day | ORAL | Status: DC
  Administered 2014-03-04 – 2014-03-08 (×5): 1 via ORAL
  Filled 2014-03-03 (×5): qty 1

## 2014-03-03 MED ORDER — MOMETASONE FURO-FORMOTEROL FUM 100-5 MCG/ACT IN AERO
2.0000 | INHALATION_SPRAY | Freq: Two times a day (BID) | RESPIRATORY_TRACT | Status: DC
  Administered 2014-03-04 – 2014-03-08 (×9): 2 via RESPIRATORY_TRACT
  Filled 2014-03-03 (×2): qty 8.8

## 2014-03-03 MED ORDER — BACITRACIN-NEOMYCIN-POLYMYXIN OINTMENT TUBE
1.0000 "application " | TOPICAL_OINTMENT | CUTANEOUS | Status: DC | PRN
  Filled 2014-03-03: qty 15

## 2014-03-03 MED ORDER — ZIPRASIDONE MESYLATE 20 MG IM SOLR: 10.0000 mg | Freq: Once | INTRAMUSCULAR | Status: DC

## 2014-03-03 MED ORDER — FOLIC ACID 1 MG PO TABS
1.0000 mg | ORAL_TABLET | Freq: Every day | ORAL | Status: DC
  Administered 2014-03-04 – 2014-03-08 (×5): 1 mg via ORAL
  Filled 2014-03-03 (×5): qty 1

## 2014-03-03 MED ORDER — MORPHINE SULFATE 4 MG/ML IJ SOLN
4.0000 mg | Freq: Once | INTRAMUSCULAR | Status: AC
  Administered 2014-03-03: 4 mg via INTRAVENOUS
  Filled 2014-03-03: qty 1

## 2014-03-03 MED ORDER — AMLODIPINE BESYLATE 5 MG PO TABS
5.0000 mg | ORAL_TABLET | Freq: Every day | ORAL | Status: DC
  Administered 2014-03-04 – 2014-03-08 (×5): 5 mg via ORAL
  Filled 2014-03-03 (×5): qty 1

## 2014-03-03 MED ORDER — ZIPRASIDONE MESYLATE 20 MG IM SOLR
20.0000 mg | Freq: Four times a day (QID) | INTRAMUSCULAR | Status: DC | PRN
Start: 2014-03-03 — End: 2014-03-08
  Administered 2014-03-03: 20 mg via INTRAMUSCULAR

## 2014-03-03 MED ORDER — QUETIAPINE FUMARATE 25 MG PO TABS
150.0000 mg | ORAL_TABLET | Freq: Every day | ORAL | Status: DC
  Administered 2014-03-04 – 2014-03-07 (×5): 150 mg via ORAL
  Filled 2014-03-03 (×5): qty 6

## 2014-03-03 MED ORDER — ZIPRASIDONE MESYLATE 20 MG IM SOLR
20.0000 mg | Freq: Four times a day (QID) | INTRAMUSCULAR | Status: DC | PRN
  Administered 2014-03-03: 20 mg via INTRAMUSCULAR
  Filled 2014-03-03 (×2): qty 20

## 2014-03-03 MED ORDER — ONDANSETRON 4 MG PO TBDP
4.0000 mg | ORAL_TABLET | Freq: Once | ORAL | Status: AC
  Administered 2014-03-03: 4 mg via ORAL
  Filled 2014-03-03: qty 1

## 2014-03-03 MED ORDER — VITAMIN D 1000 UNITS PO TABS
10000.0000 [IU] | ORAL_TABLET | Freq: Every day | ORAL | Status: DC
  Administered 2014-03-04 – 2014-03-08 (×5): 10000 [IU] via ORAL
  Filled 2014-03-03 (×7): qty 10

## 2014-03-03 MED ORDER — ACETAMINOPHEN 500 MG PO TABS: 1000.0000 mg | ORAL_TABLET | Freq: Once | ORAL | Status: DC

## 2014-03-03 MED ORDER — ALBUTEROL SULFATE HFA 108 (90 BASE) MCG/ACT IN AERS
2.0000 | INHALATION_SPRAY | Freq: Every day | RESPIRATORY_TRACT | Status: DC | PRN
  Administered 2014-03-03: 2 via RESPIRATORY_TRACT
  Filled 2014-03-03: qty 6.7

## 2014-03-03 MED ORDER — TRAMADOL HCL 50 MG PO TABS
50.0000 mg | ORAL_TABLET | Freq: Three times a day (TID) | ORAL | Status: DC | PRN
  Administered 2014-03-04 – 2014-03-07 (×6): 50 mg via ORAL
  Filled 2014-03-03 (×6): qty 1

## 2014-03-03 MED ORDER — CARVEDILOL 25 MG PO TABS
25.0000 mg | ORAL_TABLET | Freq: Two times a day (BID) | ORAL | Status: DC
  Administered 2014-03-04 – 2014-03-08 (×11): 25 mg via ORAL
  Filled 2014-03-03 (×14): qty 1

## 2014-03-03 MED ORDER — PANTOPRAZOLE SODIUM 40 MG PO TBEC
40.0000 mg | DELAYED_RELEASE_TABLET | Freq: Every day | ORAL | Status: DC
  Administered 2014-03-04 – 2014-03-08 (×5): 40 mg via ORAL
  Filled 2014-03-03 (×5): qty 1

## 2014-03-03 MED ORDER — TIZANIDINE HCL 4 MG PO TABS
4.0000 mg | ORAL_TABLET | Freq: Three times a day (TID) | ORAL | Status: DC | PRN
  Administered 2014-03-05 – 2014-03-07 (×4): 4 mg via ORAL
  Filled 2014-03-03 (×5): qty 1

## 2014-03-03 MED ORDER — POTASSIUM CHLORIDE CRYS ER 20 MEQ PO TBCR
40.0000 meq | EXTENDED_RELEASE_TABLET | Freq: Every day | ORAL | Status: DC
  Administered 2014-03-04 – 2014-03-08 (×5): 40 meq via ORAL
  Filled 2014-03-03 (×5): qty 2

## 2014-03-03 NOTE — ED Notes (Signed)
Pt reports shortness of breath at this time. Respiratory informed

## 2014-03-03 NOTE — ED Notes (Signed)
Attempted to contact [facility] concerning telepsych; Unable to get in touch with [facility]

## 2014-03-03 NOTE — Progress Notes (Signed)
Pt stated she was short of breath, RN asked for RT to come and listen and assess pt. Pt sounds clear and is moving good air throughout lungs at this time. Pt explained that she takes Dulera at home and Ventolin PRN, RT let RN know and RN stated she would talk to MD about treatment options.

## 2014-03-03 NOTE — ED Notes (Signed)
[place] C at bedside completing in and out cath

## 2014-03-03 NOTE — ED Notes (Signed)
Attempting to get EKG, pt not holding still when asked.

## 2014-03-03 NOTE — ED Notes (Signed)
Pt refusing IV, states "I dont want any treatment", pt requesting to be seen by her PCP, pt explained to that the ED MD will see her today. Pt said "Get the fuck out".
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
## 2014-03-03 NOTE — ED Notes (Signed)
Answered call bell. Pt yelling through phone that she needs help "Stat." Opened door to find pt sitting at foot of bed naked. (rails of bed up). I asked pt what she was doing on the floor and pt proceeded to cuss and yell at me saying she had a Grand Mal seizure and was in severe Respiratory distress. Pt stated she could not stand up. Nurses, Camera operator, and Doctor informed of event. Pt continuing to scream and curse at everyone saying she is in respiratory distress.

## 2014-03-03 NOTE — ED Notes (Signed)
Dr. James at bedside  

## 2014-03-03 NOTE — ED Notes (Signed)
Per [facility], pt walked to neighbors house, sat down on the floor and demanded they call the fire department. Fire dept arrived, pt uncooperative, swearing. Pt yelling that she was having an "emergency cardiac event". Upon EMS arrival, pt very uncooperative. Pt having chest pressure, three elephants sitting on her chest. Pt given 1 nitro, 324 asa, 12 lead unremarkable. Neighbor of patient told fire dept that pt behavior is not out of the norm. Hx of behavioral issues. EMS attempted IV start, pt demanding IV but not allowing EMS to place IV. Pt is AAOX4, in NAD at this time. Attempted to place patient on the monitor.

## 2014-03-03 NOTE — ED Notes (Signed)
Pt calm and cooperative with IV team at this time. Zofran given. Would not allow her bracelot to be scanned.
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
## 2014-03-03 NOTE — ED Notes (Signed)
Pt feels nauseous, requesting Nausea medicine.

## 2014-03-03 NOTE — ED Notes (Addendum)
Pt yelling profanities at staff. Sts "I'm not a damn dog this is sorry. I've wet myself and you people just leave me here." Two RNS at bedside to clean patient. Pt requesting washcloth; when handed washcloth pt threw washcloth stating "Give me something warm, this s@!& is cold." Attempting to clean patient; pt states not to touch her.

## 2014-03-03 NOTE — ED Notes (Addendum)
Pt found sitting in floor of room yelling at staff. Pt states "I had a grand mal seizure and I'm in severe respiratory distress! I have called three times for respiratory because I am in severe respiratory distress!" Informed pt we would assist her from floor to the bed; "no you will not! You will get enough staff to lift me often this floor!" Charge RN at bedside

## 2014-03-03 NOTE — ED Notes (Signed)
Heidi Young, phlebotomy at the bedside.

## 2014-03-03 NOTE — ED Notes (Signed)
Charge nurse informed of pt still yelling. AD in to speak with pt.

## 2014-03-03 NOTE — ED Notes (Signed)
Dr.James aware of patient's aggitation; Geodon ordered; verified time to repeat geodon.

## 2014-03-03 NOTE — ED Notes (Signed)
Respiratory at bedside.

## 2014-03-03 NOTE — ED Notes (Signed)
Agricultural consultant at bedside; Informed patient of geodon injection; pt lift arm to receive injection.

## 2014-03-03 NOTE — ED Notes (Signed)
Pt dressed herself and discontinued cardiac monitor. Pt more calm and cooperative at this time.

## 2014-03-03 NOTE — ED Notes (Signed)
Spoke to [facility] about Telepsych consult; reports several people ahead of pt

## 2014-03-03 NOTE — ED Notes (Signed)
Pt assisted to bed by EMT; 2 RNs, and security guard. Pt yelling at staff after assisting her to bed. Pt states "Dont' f*g talk to me. Do not ask me any questions it's in my chart" "I need my damn respiratory treatments!"

## 2014-03-03 NOTE — ED Notes (Signed)
CT called at this time. Unable to take pt at this time. CT will call when ready.

## 2014-03-03 NOTE — ED Notes (Signed)
Spoke with ACT team about the delay in TTS they stated they would speak with the counselor and try to expedite the process.

## 2014-03-03 NOTE — ED Notes (Addendum)
This RN back in room to offer morphine and NTG. Pt starts yelling at this RN that she wants to be transferred to [facility]. Pt asking for a complete assessment. Informed that Dr. Regenia Skeeter had been in to talk with her earlier about why she had called EMS. Pt not allowing this RN to finish comments and continues to be verbally aggressive and agitated. Pt encouraged to allow this RN to give her something for pain and pt states "yes get me something for pain". Requested that pt allow this RN to get a temp on her since she said she was febrile earlier and pt begins to yell then stops, holds her hand up and says, "that's it" and starts yelling about wanting the same medications that she takes at home. This RN informed pt that Dr. Regenia Skeeter had come in earlier to talk with her about what she needed to have done today. Pt continues to curse and yell loudly at this RN.

## 2014-03-03 NOTE — ED Notes (Addendum)
Charge RN in to speak with pt. Security in the hallway if needed.

## 2014-03-03 NOTE — ED Notes (Signed)
Pt yelling "Are you f---ing stupid?! 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 Do I need to do this myself! This is sorry. I will have [employer] down here. I want to speak to the supervisor. This is sorryPharmacist, community aware and is at bedside
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
## 2014-03-03 NOTE — ED Notes (Addendum)
Pt calm and cooperative on assessment. Reports  Morphine took the "edge off;" continues to report pain is 20/10. Pt sts "I came here requesting a full head to toe assessment and to have my feet and legs looked at." Bilateral extremities +2 pitting edema and warm to the touch. Pt also reports having a "flu and shingles" vaccination on Monday; now reports "I have shingles under my breast that hurt." Assessed breast for shingles; skin is red and intact; no shingles noted under breast. 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 NAD noted at time of assessment. Pt is requesting pharmacy technician to verify home medications

## 2014-03-03 NOTE — ED Notes (Addendum)
This RN in to room to given NTG. Pt lying on stretcher and just starts crying, sat up and pulled back her blanket to show her legs. Bilateral legs are swollen, pt not allowing this RN to touch them, swats at my hands. Pt becoming loud and starting to yell, door is open. Requested pt not yell and curse. Pt continues to get louder. Dr. Regenia Skeeter back in room asking what is going on. Pt states, "i'm febrile and hurting and need some treatment done." This RN attempted to ask pt if her temp could be checked, she proceeded with "yes you can do anything you need to do". Pt would not allow this RN to obtain temperature at this time. Dr. Regenia Skeeter remained in room and this RN stepped out.

## 2014-03-03 NOTE — ED Notes (Addendum)
Pt states "I haven't had any nourishments, no liquids; I need my cardiac medicines and I want my asthma medications." Pt yelling at nurse that she is telling us what to do she will not be doing what we asked. Pt given [place] sandwich and drinks.

## 2014-03-03 NOTE — ED Notes (Addendum)
Pt reports being "saturated in urine." Pt's linens changed and pt cleaned. Reports wanting to speak to the chaplain. Chaplain paged per Secretary. Pt states "manic! I'm not manic. I'm sick and febrile. I've had a stroke today and a seizure. I've never had those, check the chart." Pt following commands at this time

## 2014-03-03 NOTE — ED Notes (Signed)
Dr. Regenia Skeeter at the bedside with this RN. Pt cursing staff. States she can't talk because of a stroke but is speaking in complete sentences and is in NAD with respirations at this time.

## 2014-03-03 NOTE — ED Provider Notes (Addendum)
CSN: [id number]     Arrival date & time 03/03/14  1206 History   First MD Initiated Contact with Patient 03/03/14 1210     Chief Complaint  Patient presents with  . Chest Pain     (Consider location/radiation/quality/duration/timing/severity/associated sxs/prior Treatment) HPI 68 year old female presents with a chief complaint of chest pain. Patient is agitated prior to me seeing her. She keeps pointing to her taper and board and saying that she has blood pressure of 250/120. When asked her when this was she cannot tell me. She keeps repeating that she's had a stroke and has trouble communicating. She becomes frustrated when asked her more questions. When asked route chest pain she states she does have it is unable to tell how long it's been going on. She does tell me that there are 3 elephants in sitting on her chest. The pain is a 20/10. She gets angry in the middle the interview and states that she needs an emergency IV and keeps pointing her left wrist. She intermittently is yelling at staff and swearing. She keeps referring back to her notes on her paper and telling me to call her PCP who is expecting her and will come in immediately to come see her. She states that she is angry she has not been placed on oxygen because she has a history of asthma. This while her oxygen saturation is 100%. The patient is mostly unable to be re-directed back to what is specifically wrong today.  Past Medical History  Diagnosis Date  . Chest pain   . Cardiomyopathy   . HTN (hypertension)   . Takotsubo cardiomyopathy   . HLD (hyperlipidemia)   . Asthma   . Fibromyalgia   . DJD (degenerative joint disease)   . Chronic anemia   . Obesity   . Carotid bruit     left  . Psychosis   . Lumbar herniated disc   . UTI (lower urinary tract infection)   . Schizophrenia   . Cancer     renal cell carcinoma  . Heart murmur   . Single kidney   . History of kidney cancer     unknown type. had R kidney removed  b/c of CA.   Marland Kitchen Kidney stones   . GERD (gastroesophageal reflux disease)     on rare occas. - uses peptobismol  . Bipolar 1 disorder    Past Surgical History  Procedure Laterality Date  . Nephrectomy  1990's    right, secondary to cyst  . Breast lumpectomy  90's    left, cyst  . Cystectomy  90's    back (left)  . Total knee arthroplasty  2005    right  . Cesarean section  1980    x1  . Cardiac catheterization  2010    no intervention  . Total knee arthroplasty Left 03/25/2013    Procedure: TOTAL KNEE ARTHROPLASTY;  Surgeon: Alta Corning, MD;  Location: [facility];  Service: Orthopedics;  Laterality: Left;   Family History  Problem Relation Age of Onset  . Coronary artery disease Mother 3  . Heart failure Mother     congestive  . Stroke Mother   . Brain cancer Father 32  . Hypertension      siblings  . Aortic stenosis      sibling   History  Substance Use Topics  . Smoking status: Never Smoker   . Smokeless tobacco: Not on file  . Alcohol Use: No     Comment:  rare   OB History   Grav Para Term Preterm Abortions TAB SAB Ect Mult Living                 Review of Systems  Unable to perform ROS: Other      Allergies  Bee venom; Other; Avelox; and Nsaids  Home Medications   Prior to Admission medications   Medication Sig Start Date End Date Taking? Authorizing Provider  albuterol (PROVENTIL HFA;VENTOLIN HFA) 108 (90 BASE) MCG/ACT inhaler Inhale 2 puffs into the lungs 5 (five) times daily as needed for wheezing or shortness of breath.    Historical Provider, MD  albuterol (PROVENTIL) (2.5 MG/3ML) 0.083% nebulizer solution Take 2.5 mg by nebulization every 4 (four) hours as needed for wheezing or shortness of breath.    Historical Provider, MD  amLODipine (NORVASC) 5 MG tablet Take 1 tablet (5 mg total) by mouth daily with breakfast. 10/08/13   Benjamine Mola, FNP  carvedilol (COREG) 25 MG tablet Take 1 tablet (25 mg total) by mouth 2 (two) times daily with a meal.  10/08/13   Benjamine Mola, FNP  Cholecalciferol (VITAMIN D3) 5000 UNITS CAPS Take 10,000 Units by mouth daily with lunch.    Historical Provider, MD  diclofenac sodium (VOLTAREN) 1 % GEL Apply 2 g topically every 4 (four) hours as needed (pain).    Historical Provider, MD  EPINEPHrine (EPIPEN) 0.3 mg/0.3 mL IJ SOAJ injection Inject 0.3 mg into the muscle as needed (allergic reaction).    Historical Provider, MD  folic acid (FOLVITE) 1 MG tablet Take 1 mg by mouth daily.    Historical Provider, MD  furosemide (LASIX) 40 MG tablet Take 40 mg by mouth daily.    Historical Provider, MD  levocetirizine (XYZAL) 5 MG tablet Take 5 mg by mouth every evening.    Historical Provider, MD  mirabegron ER (MYRBETRIQ) 25 MG TB24 tablet Take 25 mg by mouth daily.    Historical Provider, MD  mometasone-formoterol (DULERA) 100-5 MCG/ACT AERO Inhale 2 puffs into the lungs 2 (two) times daily. 10/08/13   Benjamine Mola, FNP  Multiple Vitamin (MULTIVITAMIN WITH MINERALS) TABS tablet Take 1 tablet by mouth daily with lunch.    Historical Provider, MD  nefazodone (SERZONE) 150 MG tablet Take 150 mg by mouth at bedtime.    Historical Provider, MD  neomycin-bacitracin-polymyxin (NEOSPORIN) [phone] ointment Apply 1 application topically as needed (rash under breast).    Historical Provider, MD  Omega-3 Fatty Acids (FISH OIL) 300 MG CAPS Take 300 mg by mouth 2 (two) times daily.    Historical Provider, MD  omeprazole (PRILOSEC) 20 MG capsule Take 20 mg by mouth 2 (two) times daily before a meal.    Historical Provider, MD  ondansetron (ZOFRAN-ODT) 4 MG disintegrating tablet Take 4 mg by mouth every 6 (six) hours as needed for nausea or vomiting.    Historical Provider, MD  oxyCODONE-acetaminophen (PERCOCET/ROXICET) 5-325 MG per tablet Take 1 tablet by mouth every 4 (four) hours as needed for severe pain. 01/09/14   Virgel Manifold, MD  Papaya TABS Take 4 tablets by mouth daily with breakfast.    Historical Provider, MD   polyethylene glycol (MIRALAX / GLYCOLAX) packet Take 17 g by mouth daily as needed (constipation). Mix in 8 oz liquid and drink    Historical Provider, MD  potassium chloride SA (K-DUR,KLOR-CON) 20 MEQ tablet Take 2 tablets (40 mEq total) by mouth daily. 10/08/13   Benjamine Mola, FNP  pravastatin (PRAVACHOL)  40 MG tablet Take 1 tablet (40 mg total) by mouth daily after supper. 10/08/13   Benjamine Mola, FNP  tiZANidine (ZANAFLEX) 4 MG tablet Take 4 mg by mouth every 8 (eight) hours as needed for muscle spasms.    Historical Provider, MD  traMADol (ULTRAM) 50 MG tablet Take 50 mg by mouth 3 (three) times daily as needed (pain).    Historical Provider, MD   BP 175/108  Pulse 99  Resp 20  SpO2 100% Physical Exam  Nursing note and vitals reviewed. Constitutional: She is oriented to person, place, and time. She appears well-developed and well-nourished.  HENT:  Head: Normocephalic and atraumatic.  Right Ear: External ear normal.  Left Ear: External ear normal.  Nose: Nose normal.  Eyes: Right eye exhibits no discharge. Left eye exhibits no discharge.  Cardiovascular: Regular rhythm and normal heart sounds.  Tachycardia present.   Pulmonary/Chest: Effort normal and breath sounds normal. She has no wheezes. She has no rales. She exhibits tenderness.  Abdominal: Soft. She exhibits no distension. There is no tenderness.  Musculoskeletal: She exhibits edema (bilateral lower extremity edema).  Neurological: She is alert and oriented to person, place, and time.  Speaking/yelling in clear sentences, moves all 4 extremities equally but will not follow commands well enough for formal neuro exam  Skin: Skin is warm and dry.  Psychiatric: Her affect is angry. Her speech is rapid and/or pressured and tangential. She is agitated and aggressive.    ED Course  Procedures (including critical care time) Labs Review Labs Reviewed  CBC WITH DIFFERENTIAL - Abnormal; Notable for the following:    Hemoglobin  10.8 (*)    HCT 33.9 (*)    MCH 25.8 (*)    All other components within normal limits  BASIC METABOLIC PANEL - Abnormal; Notable for the following:    Sodium 136 (*)    CO2 18 (*)    Creatinine, Ser 1.15 (*)    GFR calc non Af Amer 48 (*)    GFR calc Af Amer 55 (*)    Anion gap 16 (*)    All other components within normal limits  PRO B NATRIURETIC PEPTIDE  ETHANOL  URINALYSIS, ROUTINE W REFLEX MICROSCOPIC  URINE RAPID DRUG SCREEN (HOSP PERFORMED)  I-STAT TROPOININ, ED    Imaging Review Dg Chest Portable 1 View  03/03/2014   CLINICAL DATA:  Chest pain and confusion.  EXAM: PORTABLE CHEST - 1 VIEW  COMPARISON:  None.  FINDINGS: The heart size and mediastinal contours are within normal limits. Stable elevation of the right hemidiaphragm. Bibasilar atelectasis present. There is no evidence of pulmonary edema, consolidation, pneumothorax, nodule or pleural fluid. The visualized skeletal structures are unremarkable.  IMPRESSION: Bibasilar atelectasis.   Electronically Signed   By: Aletta Edouard M.D.   On: 03/03/2014 14:10     EKG Interpretation   Date/Time:  Wednesday March 03 2014 12:18:34 EDT Ventricular Rate:  90 PR Interval:  151 QRS Duration: 94 QT Interval:  384 QTC Calculation: 470 R Axis:   43 Text Interpretation:  Sinus rhythm Atrial premature complexes Borderline T  abnormalities, anterior leads Minimal ST elevation, inferior leads  Baseline wander in lead(s) I III aVR aVL aVF Confirmed by Jeneen Rinks  MD, [place]  [phone]) on 03/03/2014 3:05:11 PM      MDM   Final diagnoses:  None    Patient has a history of multiple medical problems including cardiomyopathy, hypertension, chronic anemia, as well as psychiatric issues such as schizophrenia  bipolar. I called her PCP, Dr. Jeanie Cooks, and he states she's never had a CVA before and this is more c/w her prior psychiatric issues. Chart review shows that she has been admitted twice in the past year and psychiatric notes show that  she was angry and agitated in similar ways. Her neurologic exam is difficult because she's not require operative and yelling she is moving all 4 extremities and has no facial droop. Her EKG shows no new signs of ischemia and her initial troponin is normal. She is occasionally able to be calm back down after extensive talking but she still verbally abusive to staff and threatening. The nurse manager was able to talk the patient and she has remained calm thus far. If she becomes agitated again she will need Geodon. At this point I feel that most of her symptoms are psychiatric in nature and that this is an episode of acute psychosis. Will consult psychiatry for their recommendations. CT scan of head currently pending due to back up at the CT scanner. Given her headache and saying she's had a CVA at some point will rule out bleed or obvious infarction. Care transferred to Dr. Jeneen Rinks with plan in place to consult psych. Will get serial troponins as well.    Ephraim Hamburger, MD 03/03/14 1554  Ephraim Hamburger, MD 03/03/14 1556

## 2014-03-04 LAB — I-STAT TROPONIN, ED: Troponin i, poc: 0 ng/mL (ref 0.00–0.08)

## 2014-03-04 MED ORDER — ALBUTEROL SULFATE HFA 108 (90 BASE) MCG/ACT IN AERS
1.0000 | INHALATION_SPRAY | RESPIRATORY_TRACT | Status: DC | PRN
  Administered 2014-03-04 – 2014-03-07 (×6): 2 via RESPIRATORY_TRACT
  Administered 2014-03-08: 1 via RESPIRATORY_TRACT
  Administered 2014-03-08: 2 via RESPIRATORY_TRACT
  Filled 2014-03-04 (×3): qty 6.7

## 2014-03-04 MED ORDER — STERILE WATER FOR INJECTION IJ SOLN
1.2000 mL | Freq: Once | INTRAMUSCULAR | Status: AC
  Administered 2014-03-04: 1.2 mL via INTRAMUSCULAR

## 2014-03-04 MED ORDER — STERILE WATER FOR INJECTION IJ SOLN
INTRAMUSCULAR | Status: AC
  Filled 2014-03-04: qty 10

## 2014-03-04 MED ORDER — ZIPRASIDONE MESYLATE 20 MG IM SOLR
20.0000 mg | Freq: Once | INTRAMUSCULAR | Status: AC
  Administered 2014-03-04: 20 mg via INTRAMUSCULAR
  Filled 2014-03-04: qty 20

## 2014-03-04 NOTE — ED Notes (Signed)
Pt given skin protectant cream for itching and abrasions

## 2014-03-04 NOTE — ED Notes (Signed)
Patient at desk. Her speech is pressured and rapid. States she has terrible insomnia and has not slept since last Wednesday. 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 Patient is easy to enguage in conversation but is difficult to stay on topic. She is very tangental. She does get verbally aggressive with staff when she is not "getting my abc treatments and emergency treatments because i had a massive heart attack last night when i called the paramedics then had a massive stroke in the other department", she also refuses to stay in her room when requested.

## 2014-03-04 NOTE — BH Assessment (Signed)
Decision for Tele Psych verses Tele Assessment made as per AVA at 10:45. Extender aware of need for TP.  Sheilah Pigeon, LCSW

## 2014-03-04 NOTE — ED Provider Notes (Signed)
I have completed the involuntary commitment papers for this patient. On my brief morning exam the patient is rolling around in her bed, not interacting appropriately.  Carmin Muskrat, MD 03/04/14 [phone]

## 2014-03-04 NOTE — ED Notes (Signed)
Called the magistrate office to confirm that ivc papers have been received. They have

## 2014-03-04 NOTE — ED Notes (Signed)
Pt had explanation of getting geodon. She is agreeable to taking shot without need of physical intervention.

## 2014-03-04 NOTE — ED Notes (Signed)
Pt following commands at this time. Reports she is sorry and just wants to make sure their is "adequate communication"

## 2014-03-04 NOTE — ED Notes (Signed)
TTS in progress 

## 2014-03-04 NOTE — Consult Note (Signed)
Telepsych Consultation   Reason for Consult:  Manic episode Referring Physician:  Melanny Young is an 68 y.o. female.  Assessment: AXIS I:  Bipolar 1 disorder AXIS II:  Deferred AXIS III:   Past Medical History  Diagnosis Date  . Chest pain   . Cardiomyopathy   . HTN (hypertension)   . Takotsubo cardiomyopathy   . HLD (hyperlipidemia)   . Asthma   . Fibromyalgia   . DJD (degenerative joint disease)   . Chronic anemia   . Obesity   . Carotid bruit     left  . Psychosis   . Lumbar herniated disc   . UTI (lower urinary tract infection)   . Schizophrenia   . Cancer     renal cell carcinoma  . Heart murmur   . Single kidney   . History of kidney cancer     unknown type. had R kidney removed b/c of CA.   Marland Kitchen Kidney stones   . GERD (gastroesophageal reflux disease)     on rare occas. - uses peptobismol  . Bipolar 1 disorder    AXIS IV:  other psychosocial or environmental problems and Chronic medical conditions AXIS V:  21-30 behavior considerably influenced by delusions or hallucinations OR serious impairment in judgment, communication OR inability to function in almost all areas  Plan:  Recommend psychiatric Inpatient admission when medically cleared.  For medication adjustment, psychiatrist and psychotherapy counseling.  Subjective:   Heidi Young is a 68 y.o. female, She reports, "I was brought to the hospital because I was suffering from mania and stroking out. I have been under a lot of stress. I suffered a stroke and that was what brought me to the hospital".    HPI:  Heidi Young states that she had her neighbors checking up on her from time to time, it was on one of those checks that they found her stroking out, they called 911.  She expresses frustration, being aggravated and she simply wanted to get some help. Heidi Young says, she does not want to remain in the hospital for further treatment.  She says, "I have home health care in place and my son  and grandson will help me."  During this assessment, Heidi Young presents with rapid speech, restlessness and fidgeting. In the early part of this assessment, Heidi Young says she has a son and grandson that she will be staying with after discharge from the ED.  However, when asked for the son's contact number, she states that the son is incarcerated and the grandson will be the one helping her, he is of a minor age of 14.  She retracted her statement by saying, "I'm sorry but I'm getting mixed up".  She denies SIHI, AVH at the present time. She presents with history of hypertension and on norvasc daily.    HPI Elements:   Location:  Bipolar affective disorder. Quality:  Talkative, rapid/pressured speech, restless. Severity:  Severe, . Timing:  recent exacerbation of symptoms.. Duration:  Chronic. Context:  Manic episode of bipolar disorder.  Past Psychiatric History: Past Medical History  Diagnosis Date  . Chest pain   . Cardiomyopathy   . HTN (hypertension)   . Takotsubo cardiomyopathy   . HLD (hyperlipidemia)   . Asthma   . Fibromyalgia   . DJD (degenerative joint disease)   . Chronic anemia   . Obesity   . Carotid bruit     left  . Psychosis   . Lumbar herniated disc   .  UTI (lower urinary tract infection)   . Schizophrenia   . Cancer     renal cell carcinoma  . Heart murmur   . Single kidney   . History of kidney cancer     unknown type. had R kidney removed b/c of CA.   Marland Kitchen Kidney stones   . GERD (gastroesophageal reflux disease)     on rare occas. - uses peptobismol  . Bipolar 1 disorder     reports that she has never smoked. She does not have any smokeless tobacco history on file. She reports that she uses illicit drugs (Marijuana) about once per week. She reports that she does not drink alcohol. Family History  Problem Relation Age of Onset  . Coronary artery disease Mother 33  . Heart failure Mother     congestive  . Stroke Mother   . Brain cancer Father 43  .  Hypertension      siblings  . Aortic stenosis      sibling         Allergies:   Allergies  Allergen Reactions  . Bee Venom Anaphylaxis  . Other Anaphylaxis    Red ant's, roaches   . Avelox [Moxifloxacin Hydrochloride] Rash and Other (See Comments)    Fever / Upset Stomach  . Nsaids Other (See Comments)    Due to stomach ulcers    ACT Assessment Complete:  Yes:    Educational Status    Risk to Self:    Risk to Others:    Abuse:    Prior Inpatient Therapy:    Prior Outpatient Therapy:    Additional Information:     Objective: Blood pressure 148/81, pulse 78, temperature 99.4 F (37.4 C), temperature source Oral, resp. rate 16, SpO2 100.00%.There is no weight on file to calculate BMI. Results for orders placed during the hospital encounter of 03/03/14 (from the past 72 hour(s))  CBC WITH DIFFERENTIAL     Status: Abnormal   Collection Time    03/03/14 12:49 PM      Result Value Ref Range   WBC 6.5  4.0 - 10.5 K/uL   RBC 4.18  3.87 - 5.11 MIL/uL   Hemoglobin 10.8 (*) 12.0 - 15.0 g/dL   HCT 33.9 (*) 36.0 - 46.0 %   MCV 81.1  78.0 - 100.0 fL   MCH 25.8 (*) 26.0 - 34.0 pg   MCHC 31.9  30.0 - 36.0 g/dL   RDW 13.8  11.5 - 15.5 %   Platelets 266  150 - 400 K/uL   Neutrophils Relative % 65  43 - 77 %   Neutro Abs 4.3  1.7 - 7.7 K/uL   Lymphocytes Relative 19  12 - 46 %   Lymphs Abs 1.3  0.7 - 4.0 K/uL   Monocytes Relative 11  3 - 12 %   Monocytes Absolute 0.7  0.1 - 1.0 K/uL   Eosinophils Relative 4  0 - 5 %   Eosinophils Absolute 0.3  0.0 - 0.7 K/uL   Basophils Relative 1  0 - 1 %   Basophils Absolute 0.0  0.0 - 0.1 K/uL  BASIC METABOLIC PANEL     Status: Abnormal   Collection Time    03/03/14 12:49 PM      Result Value Ref Range   Sodium 136 (*) 137 - 147 mEq/L   Potassium 4.2  3.7 - 5.3 mEq/L   Chloride 102  96 - 112 mEq/L   CO2 18 (*) 19 - 32  mEq/L   Glucose, Bld 96  70 - 99 mg/dL   BUN 23  6 - 23 mg/dL   Creatinine, Ser 1.15 (*) 0.50 - 1.10 mg/dL   Calcium  9.5  8.4 - 10.5 mg/dL   GFR calc non Af Amer 48 (*) >90 mL/min   GFR calc Af Amer 55 (*) >90 mL/min   Comment: (NOTE)     The eGFR has been calculated using the CKD EPI equation.     This calculation has not been validated in all clinical situations.     eGFR's persistently <90 mL/min signify possible Chronic Kidney     Disease.   Anion gap 16 (*) 5 - 15  PRO B NATRIURETIC PEPTIDE     Status: None   Collection Time    03/03/14 12:52 PM      Result Value Ref Range   Pro B Natriuretic peptide (BNP) 71.8  0 - 125 pg/mL  ETHANOL     Status: None   Collection Time    03/03/14  1:17 PM      Result Value Ref Range   Alcohol, Ethyl (B) <11  0 - 11 mg/dL   Comment:            LOWEST DETECTABLE LIMIT FOR     SERUM ALCOHOL IS 11 mg/dL     FOR MEDICAL PURPOSES ONLY  I-STAT TROPOININ, ED     Status: None   Collection Time    03/03/14  1:47 PM      Result Value Ref Range   Troponin i, poc 0.00  0.00 - 0.08 ng/mL   Comment 3            Comment: Due to the release kinetics of cTnI,     a negative result within the first hours     of the onset of symptoms does not rule out     myocardial infarction with certainty.     If myocardial infarction is still suspected,     repeat the test at appropriate intervals.  Randolm Idol, ED     Status: None   Collection Time    03/03/14  6:57 PM      Result Value Ref Range   Troponin i, poc 0.00  0.00 - 0.08 ng/mL   Comment 3            Comment: Due to the release kinetics of cTnI,     a negative result within the first hours     of the onset of symptoms does not rule out     myocardial infarction with certainty.     If myocardial infarction is still suspected,     repeat the test at appropriate intervals.  URINALYSIS, ROUTINE W REFLEX MICROSCOPIC     Status: Abnormal   Collection Time    03/03/14 11:10 PM      Result Value Ref Range   Color, Urine YELLOW  YELLOW   APPearance CLEAR  CLEAR   Specific Gravity, Urine 1.012  1.005 - 1.030   pH  5.0  5.0 - 8.0   Glucose, UA NEGATIVE  NEGATIVE mg/dL   Hgb urine dipstick NEGATIVE  NEGATIVE   Bilirubin Urine NEGATIVE  NEGATIVE   Ketones, ur 15 (*) NEGATIVE mg/dL   Protein, ur NEGATIVE  NEGATIVE mg/dL   Urobilinogen, UA 0.2  0.0 - 1.0 mg/dL   Nitrite NEGATIVE  NEGATIVE   Leukocytes, UA NEGATIVE  NEGATIVE   Comment: MICROSCOPIC NOT DONE ON  URINES WITH NEGATIVE PROTEIN, BLOOD, LEUKOCYTES, NITRITE, OR GLUCOSE <1000 mg/dL.  URINE RAPID DRUG SCREEN (HOSP PERFORMED)     Status: Abnormal   Collection Time    03/03/14 11:10 PM      Result Value Ref Range   Opiates POSITIVE (*) NONE DETECTED   Cocaine NONE DETECTED  NONE DETECTED   Benzodiazepines NONE DETECTED  NONE DETECTED   Amphetamines NONE DETECTED  NONE DETECTED   Tetrahydrocannabinol NONE DETECTED  NONE DETECTED   Barbiturates NONE DETECTED  NONE DETECTED   Comment:            DRUG SCREEN FOR MEDICAL PURPOSES     ONLY.  IF CONFIRMATION IS NEEDED     FOR ANY PURPOSE, NOTIFY LAB     WITHIN 5 DAYS.                LOWEST DETECTABLE LIMITS     FOR URINE DRUG SCREEN     Drug Class       Cutoff (ng/mL)     Amphetamine      1000     Barbiturate      200     Benzodiazepine   200     Tricyclics       300     Opiates          300     Cocaine          300     THC              50  I-STAT TROPOININ, ED     Status: None   Collection Time    03/04/14 12:13 AM      Result Value Ref Range   Troponin i, poc 0.00  0.00 - 0.08 ng/mL   Comment 3            Comment: Due to the release kinetics of cTnI,     a negative result within the first hours     of the onset of symptoms does not rule out     myocardial infarction with certainty.     If myocardial infarction is still suspected,     repeat the test at appropriate intervals.   Labs are reviewed and are pertinent for: UDS report; positive for Opiate.  Current Facility-Administered Medications  Medication Dose Route Frequency Provider Last Rate Last Dose  . albuterol (PROVENTIL  HFA;VENTOLIN HFA) 108 (90 BASE) MCG/ACT inhaler 1-2 puff  1-2 puff Inhalation Q4H PRN Monte Fantasia, PA-C   2 puff at 03/04/14 0956  . amLODipine (NORVASC) tablet 5 mg  5 mg Oral Q breakfast Olivia Mackie, MD   5 mg at 03/04/14 0953  . carvedilol (COREG) tablet 25 mg  25 mg Oral BID WC Olivia Mackie, MD   25 mg at 03/04/14 0955  . cholecalciferol (VITAMIN D) tablet 10,000 Units  10,000 Units Oral Q lunch Olivia Mackie, MD   10,000 Units at 03/04/14 1202  . folic acid (FOLVITE) tablet 1 mg  1 mg Oral Daily Olivia Mackie, MD   1 mg at 03/04/14 0955  . loratadine (CLARITIN) tablet 10 mg  10 mg Oral QPM Olivia Mackie, MD   10 mg at 03/04/14 0055  . mirabegron ER (MYRBETRIQ) tablet 25 mg  25 mg Oral Daily Olivia Mackie, MD   25 mg at 03/04/14 1137  . mometasone-formoterol (DULERA) 100-5 MCG/ACT inhaler 2 puff  2 puff Inhalation BID Rhona Leavens  Sharol Given, MD   2 puff at 03/04/14 0055  . multivitamin with minerals tablet 1 tablet  1 tablet Oral Q lunch Kalman Drape, MD   1 tablet at 03/04/14 1202  . neomycin-bacitracin-polymyxin (NEOSPORIN) ointment 1 application  1 application Topical PRN Kalman Drape, MD      . nitroGLYCERIN (NITROSTAT) SL tablet 0.4 mg  0.4 mg Sublingual Q5 min PRN Ephraim Hamburger, MD      . ondansetron (ZOFRAN-ODT) disintegrating tablet 4 mg  4 mg Oral Q6H PRN Kalman Drape, MD      . pantoprazole (PROTONIX) EC tablet 40 mg  40 mg Oral Daily Kalman Drape, MD   40 mg at 03/04/14 0953  . polyethylene glycol (MIRALAX / GLYCOLAX) packet 17 g  17 g Oral Daily PRN Kalman Drape, MD      . potassium chloride SA (K-DUR,KLOR-CON) CR tablet 40 mEq  40 mEq Oral Daily Kalman Drape, MD   40 mEq at 03/04/14 0954  . QUEtiapine (SEROQUEL) tablet 150 mg  150 mg Oral QHS Kalman Drape, MD   150 mg at 03/04/14 0112  . simvastatin (ZOCOR) tablet 20 mg  20 mg Oral q1800 Kalman Drape, MD      . tiZANidine (ZANAFLEX) tablet 4 mg  4 mg Oral Q8H PRN Kalman Drape, MD      . traMADol Veatrice Bourbon) tablet 50 mg  50 mg Oral TID PRN  Kalman Drape, MD   50 mg at 03/04/14 1245  . ziprasidone (GEODON) injection 20 mg  20 mg Intramuscular Q6H PRN Tanna Furry, MD   20 mg at 03/03/14 2318   Current Outpatient Prescriptions  Medication Sig Dispense Refill  . acetaminophen (TYLENOL) 650 MG CR tablet Take 650 mg by mouth every 8 (eight) hours as needed for pain.      Marland Kitchen albuterol (PROVENTIL HFA;VENTOLIN HFA) 108 (90 BASE) MCG/ACT inhaler Inhale 2 puffs into the lungs 5 (five) times daily as needed for wheezing or shortness of breath.      Marland Kitchen albuterol (PROVENTIL) (2.5 MG/3ML) 0.083% nebulizer solution Take 2.5 mg by nebulization every 4 (four) hours as needed for wheezing or shortness of breath.      Marland Kitchen amLODipine (NORVASC) 5 MG tablet Take 1 tablet (5 mg total) by mouth daily with breakfast.  30 tablet  0  . carvedilol (COREG) 25 MG tablet Take 1 tablet (25 mg total) by mouth 2 (two) times daily with a meal.  60 tablet  0  . Cholecalciferol (VITAMIN D3) 5000 UNITS CAPS Take 10,000 Units by mouth daily with lunch.      . diclofenac sodium (VOLTAREN) 1 % GEL Apply 2 g topically every 4 (four) hours as needed (pain).      Marland Kitchen EPINEPHrine (EPIPEN) 0.3 mg/0.3 mL IJ SOAJ injection Inject 0.3 mg into the muscle as needed (allergic reaction).      . folic acid (FOLVITE) 1 MG tablet Take 1 mg by mouth daily.      . furosemide (LASIX) 40 MG tablet Take 40 mg by mouth daily as needed for fluid.       Marland Kitchen guaiFENesin-dextromethorphan (ROBITUSSIN DM) 100-10 MG/5ML syrup Take 5 mLs by mouth every 4 (four) hours as needed for cough.      . levocetirizine (XYZAL) 5 MG tablet Take 5 mg by mouth every evening.      . menthol-cetylpyridinium (CEPACOL) 3 MG lozenge Take 1 lozenge by mouth as needed for sore throat.      Marland Kitchen  mirabegron ER (MYRBETRIQ) 25 MG TB24 tablet Take 25 mg by mouth daily.      . mometasone-formoterol (DULERA) 100-5 MCG/ACT AERO Inhale 2 puffs into the lungs 2 (two) times daily.  1 Inhaler  0  . Multiple Vitamin (MULTIVITAMIN WITH MINERALS)  TABS tablet Take 1 tablet by mouth daily with lunch.      . neomycin-bacitracin-polymyxin (NEOSPORIN) [phone] ointment Apply 1 application topically as needed (rash under breast).      . Omega-3 Fatty Acids (FISH OIL) 300 MG CAPS Take 300 mg by mouth daily.       Marland Kitchen omeprazole (PRILOSEC) 20 MG capsule Take 20 mg by mouth 2 (two) times daily before a meal.      . ondansetron (ZOFRAN-ODT) 4 MG disintegrating tablet Take 4 mg by mouth every 6 (six) hours as needed for nausea or vomiting.      . Papaya TABS Take 4 tablets by mouth daily with breakfast.      . polyethylene glycol (MIRALAX / GLYCOLAX) packet Take 17 g by mouth daily as needed (constipation). Mix in 8 oz liquid and drink      . potassium chloride SA (K-DUR,KLOR-CON) 20 MEQ tablet Take 2 tablets (40 mEq total) by mouth daily.  30 tablet  0  . pravastatin (PRAVACHOL) 40 MG tablet Take 1 tablet (40 mg total) by mouth daily after supper.  30 tablet  0  . QUEtiapine (SEROQUEL) 100 MG tablet Take 150 mg by mouth at bedtime.      Marland Kitchen tiZANidine (ZANAFLEX) 4 MG tablet Take 4 mg by mouth every 8 (eight) hours as needed for muscle spasms.      . traMADol (ULTRAM) 50 MG tablet Take 50 mg by mouth 3 (three) times daily as needed (pain).        Psychiatric Specialty Exam:     Blood pressure 148/81, pulse 78, temperature 99.4 F (37.4 C), temperature source Oral, resp. rate 16, SpO2 100.00%.There is no weight on file to calculate BMI.  General Appearance: Disheveled and restless, talkative  Eye Contact::  Fair  Speech:  Pressured  Volume:  Increased  Mood:  Dysphoric  Affect:  Restricted  Thought Process:  Circumstantial, Disorganized and Tangential  Orientation:  Full (Time, Place, and Person)  Thought Content:  Rumination  Suicidal Thoughts:  No  Homicidal Thoughts:  No  Memory:  Immediate;   Fair Recent;   Fair Remote;   Fair  Judgement:  Impaired  Insight:  Lacking  Psychomotor Activity:  Increased and Restlessness  Concentration:   Poor  Recall:  Fair  Akathisia:  No  Handed:  Right  AIMS (if indicated):     Assets:  Desire for Improvement  Sleep:      Treatment Plan Summary: Recommendation: Inpatient psychiatric admission for mood stabilization, medication adjustment and a psychotherapy referral.  Disposition: Inpatient admission.   Lindell Spar, PMHNP, FNP-BC. 03/04/2014 3:17 PM  Patient reviewed with NP as above

## 2014-03-04 NOTE — ED Notes (Signed)
Pt requesting multiple towels and washcloths which were given to patient. Given multiple blankets and placed non-slid socks on patient. Pt reports wanting to rest at this time.

## 2014-03-04 NOTE — ED Notes (Signed)
gpd here to serve papers

## 2014-03-04 NOTE — ED Notes (Addendum)
Pt given barrier cream for L breast. Pt reports muscle spasms in bilateral legs at this time. Reports moving makes it worse; requesting zanaflex for spasms

## 2014-03-04 NOTE — ED Provider Notes (Signed)
Patient's repeat troponins x2 remain normal. Toxicology positive only for opiates. Patient still with rambling speech. Difficulty staying on task and tangential. Given Geodon. Plan is TTS evaluation. His point she is medically clear. Reports normal vision. CT of the head without acute processes.  I think the patient will need inpatient psychiatric care. TTS evaluation pending.  Tanna Furry, MD 03/04/14 [phone]

## 2014-03-04 NOTE — ED Notes (Addendum)
Answered call bell to find pt sitting at foot of bed. (rails up) Pt saying that she is having muscle spasms and that she is going to fall. Asked pt to return to bed. Pt began cursing and insulting this EMT and began to strip bed. PT then showed me how to make the bed and how to put chucks on the bed. Pt still irate until I joked with the patient. Pt's domineer suddenly changed. Pt followed commands, returned to bed, allowed me to put her back on the monitor and take her bp. Pt agreed to stay in bed and was very nice. Pt calmly asked for me to tell the nurse she wanted respiratory therapy.

## 2014-03-04 NOTE — ED Notes (Addendum)
Called for sitter. Patient to be ivc due to her manic behavior and poor judgement. md is filling out ivc paperwork

## 2014-03-04 NOTE — BH Assessment (Signed)
Followed up with Agustina Caroli, NP who recommends inpatient treatment after tele psychiatry assessment completed this afternoon.

## 2014-03-04 NOTE — ED Notes (Signed)
Called tina at [facility] to check on delay in getting patients assessment note put in by np;

## 2014-03-04 NOTE — ED Notes (Signed)
Pt currently asleep; respirations unlabored.

## 2014-03-05 MED ORDER — CEPHALEXIN 250 MG PO CAPS
500.0000 mg | ORAL_CAPSULE | Freq: Four times a day (QID) | ORAL | Status: DC
  Administered 2014-03-06 – 2014-03-08 (×11): 500 mg via ORAL
  Filled 2014-03-05 (×11): qty 2

## 2014-03-05 MED ORDER — LORAZEPAM 1 MG PO TABS
1.0000 mg | ORAL_TABLET | ORAL | Status: DC | PRN
  Administered 2014-03-06 – 2014-03-08 (×6): 1 mg via ORAL
  Filled 2014-03-05 (×6): qty 1

## 2014-03-05 MED ORDER — DOCUSATE SODIUM 100 MG PO CAPS
100.0000 mg | ORAL_CAPSULE | Freq: Two times a day (BID) | ORAL | Status: DC
  Administered 2014-03-06 – 2014-03-08 (×6): 100 mg via ORAL
  Filled 2014-03-05 (×7): qty 1

## 2014-03-05 NOTE — ED Provider Notes (Signed)
Pt reporting left LE pain.  She has mild tenderness with localized erythema to left calf.  No crepitus or drainage.  Distal pulses intact   Will treat for possible cellulitis She also reports "I have one kidney, I am in pain" Pt is mildly agitated but no distress   Sharyon Cable, MD 03/05/14 2344

## 2014-03-05 NOTE — ED Notes (Signed)
Pt calm and cooperative with sitter and staff, pt eating lunch. Nad noted.

## 2014-03-05 NOTE — Progress Notes (Signed)
Patient has been referred out for inpatient placement: [facility] will also review patient   Will follow up with disposition once referrals have been reviewed.  Lane Hacker, MSW Clinical Social Work:  TTS Dispositions [phone]

## 2014-03-05 NOTE — ED Notes (Signed)
Pt has been up for passed several hours talking loud, walking the hallways and attempting to make multiple phone calls, pt is not easily redirected to room but eventually complies; pt c/o about having "Shingles", Pt demands to have Albuterol inhaler q 4 hours; pt shows no signs of SOB nor wheezing; explained to pt med is as needed for symptoms; Pt was given inhaler at bedtime.Pt cursed at RN and called RN out her name; Security aware and on stand by;

## 2014-03-05 NOTE — ED Notes (Signed)
States she will wait til the am to go to [facility]. She is requesting for he bowels states they have not moved in 10 days.
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
## 2014-03-06 LAB — CBG MONITORING, ED: Glucose-Capillary: 108 mg/dL — ABNORMAL HIGH (ref 70–99)

## 2014-03-06 NOTE — Progress Notes (Addendum)
Placed call to [facility] to f/u on pt's referral, per Shirlee Limerick a tele assessment note was not received and the only thing she had was the H&P along with the pt's IVC paper work in which from that states pt does not meet inpt criteria.  Requested that assessment note be faxed and she would review for inpt placement.  Tele assessment faxed.   Rick Duff Disposition MHT

## 2014-03-06 NOTE — BH Assessment (Signed)
Per [place] she has received fax and will present to doctor around 0600.  Lear Ng, [facility] Triage Specialist 03/06/2014 4:07 AM

## 2014-03-06 NOTE — ED Notes (Signed)
Called [facility] to get an update on placement for pt.  [place] stated her assessment is still under review and will contact this dept when placement has been found.

## 2014-03-06 NOTE — BH Assessment (Signed)
Per [place] at [place] if pt has recent EKG she can be considered for placement in the AM.   Shirlean Mylar, RN will fax EKG and chest x-ray (if available).   Lear Ng, [facility] Triage Specialist 03/06/2014 1:23 AM

## 2014-03-06 NOTE — Progress Notes (Addendum)
Follow-up calls have been placed to the following facilities regarding bed availability and pt's referral status:  [facility]- per Kasandra Knudsen will not know current bed status until after MD is finished rounding, asked that TTS place call back after 12p. [facility]- per [place] pt referral is still under review, states that their computer system is down and everything is being done by hand and will take longer than usual.  Will contact TTS once review complete. [facility]- per Susie at capacity [facility]- per [employer] at [facility]- call placed at [facility] but no answer [facility]- per Margerie at capacity  *in addition call placed to [place] a gero-psych facility, per [employer] available, will fax for review.   Rick Duff Disposition MHT

## 2014-03-06 NOTE — Progress Notes (Addendum)
Pt has been declined at [facility] per Mardene Celeste by Dr. Eugenio Hoes d/t aggression.  Call placed to [facility] to f/u on pt's referral per Shirlee Limerick pt has been declined, states pt would not be appropriate for their unit.   Rick Duff Disposition MHT

## 2014-03-06 NOTE — BH Assessment (Signed)
Requested TA equipment be set up for a brief reassessment. The machine is currently being used with another pt per Shirlean Mylar, Therapist, sports. Shirlean Mylar, RN will call when machine is available for reevaluation.   Lear Ng, [facility] Triage Specialist 03/06/2014 8:51 PM

## 2014-03-07 DIAGNOSIS — F319 Bipolar disorder, unspecified: Secondary | ICD-10-CM

## 2014-03-07 MED ORDER — LORAZEPAM 1 MG PO TABS
2.0000 mg | ORAL_TABLET | Freq: Once | ORAL | Status: AC
  Administered 2014-03-07: 2 mg via ORAL
  Filled 2014-03-07: qty 2

## 2014-03-07 NOTE — ED Notes (Signed)
Psych MD at pt bedside.

## 2014-03-07 NOTE — BH Assessment (Signed)
Per Shirlean Mylar, RN pt is awake and ready for reassessment.  Assessment to commence shortly.  Lear Ng, [facility] Triage Specialist 03/07/2014 12:35 AM

## 2014-03-07 NOTE — Consult Note (Addendum)
  Psychiatry Follow Up Note Duration  30 minutes  Patient is a 68 year old female, who has a history of Bipolar Disorder, and was admitted to ER on 9/17 reporting  Chest pain, high blood pressure. She was noted to be angry, irritable, frustrated,  Easily agitated. Work up was negative for cardiac event, an d it was felt that she was acutely disorganized and psychotic. She was seen by psychiatry  NP via tele - psychiatry, and it was felt that patient was experiencing decompensation of her Bipolar Disorder , and warranted inpatient psychiatric admission.  As per nursing staff, patient tends to be entitled at times, and with some peculiar behaviors , such as wearing undergarment  or blankets on head , but the report is that she is much improved compared to admission, calmer, not threatening or agitated.  Patient states she has been under " a lot of stress" which she mostly attributes to insect ( ants, bees) infestation in her house. She states she fears being allergic to fire ants and to bees so is afraid to live at home right now. She states she has complained to her landlord, but there has been nothing done thus far. It is not possible to determine whether this is delusional in nature, but it is certainly plausible and not bizarre.   Patient states Seroquel has been an effective and well tolerated medication for her, and denies side effects. Most recently it has been prescribed by her PCP.  At this time she is on SEROQUEL 150 mgrs QHS  And ATIVAN PRNS  ROS- denies fever, denies chills, no chest pain at this time, no SOB, no coughing, no abdominal pain , no skin symptoms or rash  endorsed at the present time.  At this time patient presents alert and attentive, she is oriented x 3, she is calm and cooperative, she states her mood is "OK" , although she states she has been feeling depressed due to stressors noted above. She presents with calm , appropriate affect and smiles appropriately at times, she is  not currently loud or expansive in affect. Thought process is linear for the most part, but does become circumstantial and over inclusive at times. She denies any suicidal or homicidal ideations, and is denying any hallucinations, nor does she seem internally preoccupied at this time. Patient denies any suicidal or homicidal ideations, and states she has no thoughts of hurting self. She is future oriented, states she plans to go to a hotel after discharge, pending her landlord getting exterminator to her home, and that family has offered to pay for the hotel for now. She does agree to outpatient psychiatric follow ups.   Assessment- at this time patient is much improved compared to admission status, and although still somewhat entitled and peculiar in grooming/dress, not exhibiting any overt or dangerous agitation or anger. She is not currently suicidal or homicidal , is future oriented, is agreeing to outpatient psychiatric treatment, and is alert and oriented x 3.   Recommendations- 1. I offered patient to remain in ED awaiting inpatient psychiatric bed  Availability. She declined and prefers to be discharged. 2. There are no current grounds for involuntary commitment.  3. Therefore, if patient wants to discharge, would recommend outpatient psychiatric management, and ask Social Worker on call to provide referral list/ dispo plan.  4. Agree with continuing Seroquel at 150 mgr QHS.

## 2014-03-07 NOTE — Progress Notes (Signed)
MHT contacted the following psychiatric facilities for bed availability:  1)[facility]-previously declined 2)[facility]-pending; no female geriatric beds at this time [facility]-faxed referral 4)[facility]-faxed referral 5)[facility]-no beds 6)[facility]-no beds 7)[facility]-faxed referral 8)[facility]-previously declined 9)[facility]-faxed referral  Heidi Young, MHT/NS

## 2014-03-07 NOTE — ED Notes (Signed)
Sitting in [employer]. Took meds with apple sauce interacting with sitter appropriately at this time.

## 2014-03-07 NOTE — ED Notes (Signed)
Md at bedside

## 2014-03-07 NOTE — ED Notes (Signed)
Pt. Agitated. States she is ready to go home in the am. She wants to know who else she needs to talk to to make it happen. Explained to Pt. That we would talk to the right people.

## 2014-03-07 NOTE — ED Notes (Signed)
Pt. Heard yelling at sitter from hallway. Came into room. Pt. Redirected to read Psalms from the Bible which is something that calms Pt. Down when she becomes agitated. Read several of her favorite Psalms from the Bible with her Pt. Began to calm down. Stated she was ready for bed.

## 2014-03-07 NOTE — ED Notes (Signed)
Pt. Up to BR. Still states that she is not able to have decent BM. Pt. Has been passing flautus throughout the night when she goes to the bathroom. States she needs some Mag. Citrate. Told her would address with MD. Still wants to go home in the am. Feels like she has improved enough she she got here to go home. Wants to know what process needs to take place for that to happen to which I replied it will be addressed again in the am.

## 2014-03-07 NOTE — ED Notes (Signed)
Sitting in [place] talking to sitter.

## 2014-03-07 NOTE — ED Notes (Signed)
MD completed paperwork to d/c IVC.  Paper faxed to clerk and verified it was received by magistrate.
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
## 2014-03-07 NOTE — ED Provider Notes (Addendum)
Pt up anxious at nursing station. Periods anxiety and agitation. Will give ativan re anxiety.  In reviewing record, appears very little psych medication adjustment has occurred during pts stay.  Will request psych team evaluate pt this morning and make medication adjustment as they deem appropriate.  Disposition remains pending.     Mirna Mires, MD 03/07/14 [phone]  Dr Parke Poisson evaluated 03/07/14 , and Dr Lamar Benes re-assessed just now, both state psychiatrically stable for d/c.  Pt states she plans to return to her residence, and will follow up as outpt.  sw has been consulted to assist w return to home.    Pt is up, ambulatory about ED, is eating and drinking. Normal mood/affect. Denies thoughts of harm to self or others.  Dr Lenna Sciara has made recommendation re seroquel dose for home.  Will also refer to close f/u at [facility] and w pcp.     Mirna Mires, MD 03/08/14 1430

## 2014-03-07 NOTE — ED Notes (Signed)
Pt sitting in hallway eating lunch while floor in rm dries-- rm C22 cleaned by staff and environmental services, pt hoarding empty juice containers, butter containers, styrofoam cups, crackers, has misplaced caps to markers, has washclothes around room "using them to clean with" . Pt was wearing mesh panties on her head with a sanitary pad (clean) attached.

## 2014-03-08 DIAGNOSIS — F319 Bipolar disorder, unspecified: Secondary | ICD-10-CM

## 2014-03-08 MED ORDER — QUETIAPINE FUMARATE ER 150 MG PO TB24: 150.0000 mg | ORAL_TABLET | Freq: Every day | ORAL | Status: DC

## 2014-03-08 MED ORDER — QUETIAPINE FUMARATE 25 MG PO TABS: 25.0000 mg | ORAL_TABLET | Freq: Two times a day (BID) | ORAL | Status: DC

## 2014-03-08 NOTE — ED Notes (Signed)
Pt standing at [employer], cussing and degrading nursing staff.

## 2014-03-08 NOTE — ED Notes (Signed)
Psych MD at bedside
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
## 2014-03-08 NOTE — ED Notes (Signed)
Pt continues to be verbally aggressive to staff and refuses to return to her room, pacing hallway.

## 2014-03-08 NOTE — Consult Note (Signed)
  Psychiatry Follow Up Note Duration  30 minutes  Patient is a 68 year old female, who has a history of Bipolar Disorder, and was admitted to ER on 9/17 reporting  Chest pain, high blood pressure. She was noted to be angry, irritable, frustrated,  Easily agitated. Work up was negative for cardiac event, an d it was felt that she was acutely disorganized and psychotic. She was seen by psychiatry  NP via tele - psychiatry, and it was felt that patient was experiencing decompensation of her Bipolar Disorder , and warranted inpatient psychiatric admission. Patient states she has been under " a lot of stress" which she mostly attributes to insect ( ants, bees) infestation in her house. She states she fears being allergic to fire ants and to bees so is afraid to live at home right now. She states she has complained to her landlord, but there has been nothing done thus far. It is not possible to determine whether this is delusional in nature, but it is certainly plausible and not bizarre.   ROS- denies fever, denies chills, no chest pain at this time, no SOB, no coughing, no abdominal pain , no skin symptoms or rash  endorsed at the present time.  At this time patient presents awake, alert and attentive, she is oriented x 3, she is calm and cooperative. She has elevated mood but no agitation or aggression. She has been feeling depressed due to stressors and not having appropriate out patient provider. She is not currently loud or expansive in affect. Thought process is linear but does have circumstantial and over inclusive at times. She denies any suicidal or homicidal ideations, and is denying any hallucinations, nor does she seem internally preoccupied at this time. She is future oriented, states she plans to go to a home with her sister who is traveling to [facility] for her after discharge. She does agree to outpatient psychiatric follow up as recommnend.   Assessment: Patient continue to be hypomanic, increased  pressured speech and loosening of association and flight of ideations and she is not exhibiting any overt or dangerous agitation or anger.  She is not currently suicidal or homicidal , is future oriented, is agreeing to outpatient psychiatric treatment, and is alert and oriented x 3.   Recommendations- 1. Recommend to be discharged to out patient psych services for medication management 2. There are no current grounds for involuntary commitment.  3. Ask Social Worker on call to provide referral list/ dispo plan.  4. Case discussed with staff RN and Dr. Ashok Cordia, Continue Seroquel 150 mg QHS and add Seroquel 25 mg BID

## 2014-03-08 NOTE — Discharge Instructions (Signed)
It was our pleasure to provide your ER care today - we hope that you feel better.  Our psychiatrist recommends that you take seroquel 25 mg 2x/day, and seroquel 150 mg at bedtime. For mental health issues and/or crisis, [facility] at [facility]. For primary care, follow up with your doctor in the next couple days.  Return to ER right away if worse, new symptoms, fevers, severe depression, other concern.     Bipolar Disorder Bipolar disorder is a mental illness. The term bipolar disorder actually is used to describe a group of disorders that all share varying degrees of emotional highs and lows that can interfere with daily functioning, such as work, school, or relationships. Bipolar disorder also can lead to drug abuse, hospitalization, and suicide. The emotional highs of bipolar disorder are periods of elation or irritability and high energy. These highs can range from a mild form (hypomania) to a severe form (mania). People experiencing episodes of hypomania may appear energetic, excitable, and highly productive. People experiencing mania may behave impulsively or erratically. They often make poor decisions. They may have difficulty sleeping. The most severe episodes of mania can involve having very distorted beliefs or perceptions about the world and seeing or hearing things that are not real (psychotic delusions and hallucinations).  The emotional lows of bipolar disorder (depression) also can range from mild to severe. Severe episodes of bipolar depression can involve psychotic delusions and hallucinations. Sometimes people with bipolar disorder experience a state of mixed mood. Symptoms of hypomania or mania and depression are both present during this mixed-mood episode. SIGNS AND SYMPTOMS There are signs and symptoms of the episodes of hypomania and mania as well as the episodes of depression. The signs and symptoms of hypomania and mania are similar but vary in severity. They  include:  Inflated self-esteem or feeling of increased self-confidence.  Decreased need for sleep.  Unusual talkativeness (rapid or pressured speech) or the feeling of a need to keep talking.  Sensation of racing thoughts or constant talking, with quick shifts between topics that may or may not be related (flight of ideas).  Decreased ability to focus or concentrate.  Increased purposeful activity, such as work, studies, or social activity, or nonproductive activity, such as pacing, squirming and fidgeting, or finger and toe tapping.  Impulsive behavior and use of poor judgment, resulting in high-risk activities, such as having unprotected sex or spending excessive amounts of money. Signs and symptoms of depression include the following:   Feelings of sadness, hopelessness, or helplessness.  Frequent or uncontrollable episodes of crying.  Lack of feeling anything or caring about anything.  Difficulty sleeping or sleeping too much.  Inability to enjoy the things you used to enjoy.   Desire to be alone all the time.   Feelings of guilt or worthlessness.  Lack of energy or motivation.   Difficulty concentrating, remembering, or making decisions.  Change in appetite or weight beyond normal fluctuations.  Thoughts of death or the desire to harm yourself. DIAGNOSIS  Bipolar disorder is diagnosed through an assessment by your caregiver. Your caregiver will ask questions about your emotional episodes. There are two main types of bipolar disorder. People with type I bipolar disorder have manic episodes with or without depressive episodes. People with type II bipolar disorder have hypomanic episodes and major depressive episodes, which are more serious than mild depression. The type of bipolar disorder you have can make an important difference in how your illness is monitored and treated. Your caregiver  may ask questions about your medical history and use of alcohol or drugs,  including prescription medication. Certain medical conditions and substances also can cause emotional highs and lows that resemble bipolar disorder (secondary bipolar disorder).  TREATMENT  Bipolar disorder is a long-term illness. It is best controlled with continuous treatment rather than treatment only when symptoms occur. The following treatments can be prescribed for bipolar disorders:  Medication--Medication can be prescribed by a doctor that is an expert in treating mental disorders (psychiatrists). Medications called mood stabilizers are usually prescribed to help control the illness. Other medications are sometimes added if symptoms of mania, depression, or psychotic delusions and hallucinations occur despite the use of a mood stabilizer.  Talk therapy--Some forms of talk therapy are helpful in providing support, education, and guidance. A combination of medication and talk therapy is best for managing the disorder over time. A procedure in which electricity is applied to your brain through your scalp (electroconvulsive therapy) is used in cases of severe mania when medication and talk therapy do not work or work too slowly. Document Released: 09/10/2000 Document Revised: 09/29/2012 Document Reviewed: 06/30/2012 [facility] Patient Information 2015 [place], [place]. This information is not intended to replace advice given to you by your health care provider. Make sure you discuss any questions you have with your health care provider.

## 2014-03-08 NOTE — Progress Notes (Signed)
[facility] contacted [facility], [facility] has geriatric female bed available.  Pt referral is under review at this time.  Note, clt has been declined from the following facilities due to [facility], [facility], [facility], [place], [facility] and [facility].  Wyvonnia Dusky, MHT/NS
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
## 2014-03-09 ENCOUNTER — Emergency Department (HOSPITAL_COMMUNITY): Payer: Medicare Other

## 2014-03-09 ENCOUNTER — Emergency Department (HOSPITAL_COMMUNITY)
Admission: EM | Admit: 2014-03-09 | Discharge: 2014-03-09 | Disposition: A | Discharge status: Home / Self Care | Payer: Medicare Other | Attending: Emergency Medicine | Admitting: Emergency Medicine

## 2014-03-09 ENCOUNTER — Encounter (HOSPITAL_COMMUNITY): Payer: Self-pay | Admitting: Emergency Medicine

## 2014-03-09 DIAGNOSIS — R011 Cardiac murmur, unspecified: Secondary | ICD-10-CM | POA: Diagnosis not present

## 2014-03-09 DIAGNOSIS — D649 Anemia, unspecified: Secondary | ICD-10-CM | POA: Insufficient documentation

## 2014-03-09 DIAGNOSIS — J45901 Unspecified asthma with (acute) exacerbation: Secondary | ICD-10-CM | POA: Diagnosis not present

## 2014-03-09 DIAGNOSIS — M1990 Unspecified osteoarthritis, unspecified site: Secondary | ICD-10-CM | POA: Diagnosis not present

## 2014-03-09 DIAGNOSIS — Z87440 Personal history of urinary (tract) infections: Secondary | ICD-10-CM | POA: Insufficient documentation

## 2014-03-09 DIAGNOSIS — F319 Bipolar disorder, unspecified: Secondary | ICD-10-CM | POA: Insufficient documentation

## 2014-03-09 DIAGNOSIS — M7989 Other specified soft tissue disorders: Secondary | ICD-10-CM | POA: Diagnosis present

## 2014-03-09 DIAGNOSIS — E669 Obesity, unspecified: Secondary | ICD-10-CM | POA: Diagnosis not present

## 2014-03-09 DIAGNOSIS — Z85528 Personal history of other malignant neoplasm of kidney: Secondary | ICD-10-CM | POA: Diagnosis not present

## 2014-03-09 DIAGNOSIS — Z9889 Other specified postprocedural states: Secondary | ICD-10-CM | POA: Diagnosis not present

## 2014-03-09 DIAGNOSIS — Z8719 Personal history of other diseases of the digestive system: Secondary | ICD-10-CM | POA: Diagnosis not present

## 2014-03-09 DIAGNOSIS — F209 Schizophrenia, unspecified: Secondary | ICD-10-CM | POA: Diagnosis not present

## 2014-03-09 DIAGNOSIS — I10 Essential (primary) hypertension: Secondary | ICD-10-CM | POA: Insufficient documentation

## 2014-03-09 DIAGNOSIS — Z79899 Other long term (current) drug therapy: Secondary | ICD-10-CM | POA: Insufficient documentation

## 2014-03-09 DIAGNOSIS — E785 Hyperlipidemia, unspecified: Secondary | ICD-10-CM | POA: Insufficient documentation

## 2014-03-09 DIAGNOSIS — Z87442 Personal history of urinary calculi: Secondary | ICD-10-CM | POA: Diagnosis not present

## 2014-03-09 DIAGNOSIS — Z792 Long term (current) use of antibiotics: Secondary | ICD-10-CM | POA: Diagnosis not present

## 2014-03-09 LAB — COMPREHENSIVE METABOLIC PANEL
ALK PHOS: 88 U/L (ref 39–117)
ALT: 21 U/L (ref 0–35)
AST: 23 U/L (ref 0–37)
Albumin: 4.2 g/dL (ref 3.5–5.2)
Anion gap: 14 (ref 5–15)
BUN: 33 mg/dL — ABNORMAL HIGH (ref 6–23)
CO2: 23 mEq/L (ref 19–32)
Calcium: 9.7 mg/dL (ref 8.4–10.5)
Chloride: 102 mEq/L (ref 96–112)
Creatinine, Ser: 1.34 mg/dL — ABNORMAL HIGH (ref 0.50–1.10)
GFR calc Af Amer: 46 mL/min — ABNORMAL LOW (ref >90)
GFR calc non Af Amer: 40 mL/min — ABNORMAL LOW (ref >90)
GLUCOSE: 112 mg/dL — AB (ref 70–99)
POTASSIUM: 4.6 meq/L (ref 3.7–5.3)
SODIUM: 139 meq/L (ref 137–147)
TOTAL PROTEIN: 7.8 g/dL (ref 6.0–8.3)
Total Bilirubin: 0.6 mg/dL (ref 0.3–1.2)

## 2014-03-09 LAB — CBC
HCT: 31.8 % — ABNORMAL LOW (ref 36.0–46.0)
Hemoglobin: 10.1 g/dL — ABNORMAL LOW (ref 12.0–15.0)
MCH: 26.6 pg (ref 26.0–34.0)
MCHC: 31.8 g/dL (ref 30.0–36.0)
MCV: 83.7 fL (ref 78.0–100.0)
Platelets: 392 10*3/uL (ref 150–400)
RBC: 3.8 MIL/uL — ABNORMAL LOW (ref 3.87–5.11)
RDW: 14.2 % (ref 11.5–15.5)
WBC: 8.2 10*3/uL (ref 4.0–10.5)

## 2014-03-09 LAB — I-STAT CG4 LACTIC ACID, ED: Lactic Acid, Venous: 1.05 mmol/L (ref 0.5–2.2)

## 2014-03-09 LAB — RAPID URINE DRUG SCREEN, HOSP PERFORMED
Amphetamines: NOT DETECTED
Barbiturates: NOT DETECTED
Benzodiazepines: NOT DETECTED
COCAINE: NOT DETECTED
Opiates: NOT DETECTED
Tetrahydrocannabinol: NOT DETECTED

## 2014-03-09 LAB — ETHANOL

## 2014-03-09 LAB — URINALYSIS, ROUTINE W REFLEX MICROSCOPIC
Bilirubin Urine: NEGATIVE
GLUCOSE, UA: NEGATIVE mg/dL
Hgb urine dipstick: NEGATIVE
Ketones, ur: NEGATIVE mg/dL
LEUKOCYTES UA: NEGATIVE
Nitrite: NEGATIVE
Protein, ur: NEGATIVE mg/dL
Specific Gravity, Urine: 1.019 (ref 1.005–1.030)
Urobilinogen, UA: 0.2 mg/dL (ref 0.0–1.0)
pH: 6 (ref 5.0–8.0)

## 2014-03-09 LAB — I-STAT TROPONIN, ED: Troponin i, poc: 0 ng/mL (ref 0.00–0.08)

## 2014-03-09 LAB — PRO B NATRIURETIC PEPTIDE: Pro B Natriuretic peptide (BNP): 44.8 pg/mL (ref 0–125)

## 2014-03-09 LAB — SALICYLATE LEVEL: Salicylate Lvl: <2 mg/dL — ABNORMAL LOW (ref 2.8–20.0)

## 2014-03-09 LAB — ACETAMINOPHEN LEVEL: Acetaminophen (Tylenol), Serum: <15 ug/mL (ref 10–30)

## 2014-03-09 MED ORDER — LORAZEPAM 1 MG PO TABS
2.0000 mg | ORAL_TABLET | Freq: Once | ORAL | Status: AC
  Administered 2014-03-09: 2 mg via ORAL
  Filled 2014-03-09: qty 2

## 2014-03-09 MED ORDER — GI COCKTAIL ~~LOC~~
30.0000 mL | Freq: Once | ORAL | Status: AC
  Administered 2014-03-09: 30 mL via ORAL
  Filled 2014-03-09: qty 30

## 2014-03-09 MED ORDER — IPRATROPIUM-ALBUTEROL 0.5-2.5 (3) MG/3ML IN SOLN
3.0000 mL | RESPIRATORY_TRACT | Status: DC
  Administered 2014-03-09: 3 mL via RESPIRATORY_TRACT
  Filled 2014-03-09: qty 3

## 2014-03-09 NOTE — ED Notes (Addendum)
Pt reports bilateral knee pain, abdominal pain, herniated disc pain, and headache. Pt reports n/v, right lower leg ulcer, and asthma. Pt appears to have swelling to lower legs. Pt reports needs breathing treatment. Lungs sound clear; RA oxygen saturation unremarkable. Pt agitated and flight of ideas.

## 2014-03-09 NOTE — Discharge Instructions (Signed)

## 2014-03-09 NOTE — ED Notes (Signed)
Accidentally clicked off I-Stat labs. Will recollect after patient returns from radiology.

## 2014-03-09 NOTE — ED Notes (Signed)
MORGAN RN AT [facility]
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
## 2014-03-09 NOTE — ED Notes (Signed)
I attempted x1 to obtain blood for I Stat. Unsuccessful x1. Heidi Young going to try

## 2014-03-09 NOTE — ED Notes (Signed)
Room assignment available at [facility]; TTS possible discharge.

## 2014-03-09 NOTE — ED Notes (Addendum)
Pt calm at present time. Pt reports feels a lot better. Pt has not vomited since arrival. Pt has eaten [place] sandwich.

## 2014-03-09 NOTE — ED Notes (Signed)
Pt transferred to xray/CT.
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
## 2014-03-09 NOTE — ED Notes (Signed)
Bed: OV29 Expected date:  Expected time:  Means of arrival:  Comments: EMS- Psych Pt

## 2014-03-09 NOTE — BH Assessment (Signed)
Pt has been accepted at [facility] room# 9450 bed 2. Accepting physician-Dr. Jeanella Flattery. Pt to be transported via [facility] dept. Pt to be IVC'd.  Dr. Lovena Le and Earleen Newport, NP however evaluated this patient and feels that their is no criteria for an inpatient admission. Patient discharged home. Writer provided patient with a list of follow up referrals prior to discharge.

## 2014-03-09 NOTE — Consult Note (Signed)
Face to face evaluation and I agree with this note 

## 2014-03-09 NOTE — ED Notes (Addendum)
Pt eating meal; pt reports heart burn. MD notified; pt given gi cocktail. Per MD pt ok to discharge. NO SI/HI.

## 2014-03-11 ENCOUNTER — Encounter (HOSPITAL_COMMUNITY): Payer: Self-pay | Admitting: Emergency Medicine

## 2014-03-11 ENCOUNTER — Emergency Department (HOSPITAL_COMMUNITY)
Admission: EM | Admit: 2014-03-11 | Discharge: 2014-03-12 | Disposition: A | Discharge status: Home / Self Care | Payer: Medicare Other | Attending: Emergency Medicine | Admitting: Emergency Medicine

## 2014-03-11 ENCOUNTER — Emergency Department (HOSPITAL_COMMUNITY): Payer: Medicare Other

## 2014-03-11 DIAGNOSIS — R21 Rash and other nonspecific skin eruption: Secondary | ICD-10-CM | POA: Insufficient documentation

## 2014-03-11 DIAGNOSIS — F3289 Other specified depressive episodes: Secondary | ICD-10-CM | POA: Diagnosis not present

## 2014-03-11 DIAGNOSIS — R51 Headache: Secondary | ICD-10-CM | POA: Insufficient documentation

## 2014-03-11 DIAGNOSIS — E669 Obesity, unspecified: Secondary | ICD-10-CM | POA: Insufficient documentation

## 2014-03-11 DIAGNOSIS — E785 Hyperlipidemia, unspecified: Secondary | ICD-10-CM | POA: Insufficient documentation

## 2014-03-11 DIAGNOSIS — D649 Anemia, unspecified: Secondary | ICD-10-CM | POA: Diagnosis not present

## 2014-03-11 DIAGNOSIS — Z87440 Personal history of urinary (tract) infections: Secondary | ICD-10-CM | POA: Diagnosis not present

## 2014-03-11 DIAGNOSIS — R109 Unspecified abdominal pain: Secondary | ICD-10-CM | POA: Insufficient documentation

## 2014-03-11 DIAGNOSIS — F209 Schizophrenia, unspecified: Secondary | ICD-10-CM | POA: Insufficient documentation

## 2014-03-11 DIAGNOSIS — R0602 Shortness of breath: Secondary | ICD-10-CM | POA: Insufficient documentation

## 2014-03-11 DIAGNOSIS — F329 Major depressive disorder, single episode, unspecified: Secondary | ICD-10-CM

## 2014-03-11 DIAGNOSIS — J45901 Unspecified asthma with (acute) exacerbation: Secondary | ICD-10-CM | POA: Insufficient documentation

## 2014-03-11 DIAGNOSIS — Z85528 Personal history of other malignant neoplasm of kidney: Secondary | ICD-10-CM | POA: Insufficient documentation

## 2014-03-11 DIAGNOSIS — M549 Dorsalgia, unspecified: Secondary | ICD-10-CM | POA: Insufficient documentation

## 2014-03-11 DIAGNOSIS — F29 Unspecified psychosis not due to a substance or known physiological condition: Secondary | ICD-10-CM

## 2014-03-11 DIAGNOSIS — I10 Essential (primary) hypertension: Secondary | ICD-10-CM | POA: Insufficient documentation

## 2014-03-11 DIAGNOSIS — F32A Depression, unspecified: Secondary | ICD-10-CM

## 2014-03-11 DIAGNOSIS — Z87442 Personal history of urinary calculi: Secondary | ICD-10-CM | POA: Insufficient documentation

## 2014-03-11 DIAGNOSIS — M7989 Other specified soft tissue disorders: Secondary | ICD-10-CM | POA: Insufficient documentation

## 2014-03-11 DIAGNOSIS — Z79899 Other long term (current) drug therapy: Secondary | ICD-10-CM | POA: Insufficient documentation

## 2014-03-11 DIAGNOSIS — R011 Cardiac murmur, unspecified: Secondary | ICD-10-CM | POA: Diagnosis not present

## 2014-03-11 DIAGNOSIS — R509 Fever, unspecified: Secondary | ICD-10-CM | POA: Diagnosis not present

## 2014-03-11 DIAGNOSIS — F319 Bipolar disorder, unspecified: Secondary | ICD-10-CM | POA: Diagnosis not present

## 2014-03-11 DIAGNOSIS — K219 Gastro-esophageal reflux disease without esophagitis: Secondary | ICD-10-CM | POA: Insufficient documentation

## 2014-03-11 LAB — RAPID URINE DRUG SCREEN, HOSP PERFORMED
Amphetamines: NOT DETECTED
Barbiturates: NOT DETECTED
Benzodiazepines: NOT DETECTED
COCAINE: NOT DETECTED
OPIATES: NOT DETECTED
Tetrahydrocannabinol: NOT DETECTED

## 2014-03-11 LAB — COMPREHENSIVE METABOLIC PANEL
ALK PHOS: 109 U/L (ref 39–117)
ALT: 25 U/L (ref 0–35)
AST: 22 U/L (ref 0–37)
Albumin: 3.8 g/dL (ref 3.5–5.2)
Anion gap: 14 (ref 5–15)
BUN: 30 mg/dL — ABNORMAL HIGH (ref 6–23)
CALCIUM: 9.3 mg/dL (ref 8.4–10.5)
CO2: 21 meq/L (ref 19–32)
Chloride: 104 mEq/L (ref 96–112)
Creatinine, Ser: 1.23 mg/dL — ABNORMAL HIGH (ref 0.50–1.10)
GFR calc Af Amer: 51 mL/min — ABNORMAL LOW (ref >90)
GFR, EST NON AFRICAN AMERICAN: 44 mL/min — AB (ref >90)
Glucose, Bld: 93 mg/dL (ref 70–99)
Potassium: 4.5 mEq/L (ref 3.7–5.3)
Sodium: 139 mEq/L (ref 137–147)
Total Bilirubin: 0.5 mg/dL (ref 0.3–1.2)
Total Protein: 7.2 g/dL (ref 6.0–8.3)

## 2014-03-11 LAB — URINALYSIS, ROUTINE W REFLEX MICROSCOPIC
Bilirubin Urine: NEGATIVE
GLUCOSE, UA: NEGATIVE mg/dL
HGB URINE DIPSTICK: NEGATIVE
Ketones, ur: NEGATIVE mg/dL
Leukocytes, UA: NEGATIVE
Nitrite: NEGATIVE
PROTEIN: NEGATIVE mg/dL
SPECIFIC GRAVITY, URINE: 1.015 (ref 1.005–1.030)
Urobilinogen, UA: 0.2 mg/dL (ref 0.0–1.0)
pH: 5.5 (ref 5.0–8.0)

## 2014-03-11 LAB — I-STAT TROPONIN, ED: Troponin i, poc: 0 ng/mL (ref 0.00–0.08)

## 2014-03-11 LAB — CBC WITH DIFFERENTIAL/PLATELET
BASOS ABS: 0 10*3/uL (ref 0.0–0.1)
Basophils Relative: 0 % (ref 0–1)
EOS PCT: 6 % — AB (ref 0–5)
Eosinophils Absolute: 0.4 10*3/uL (ref 0.0–0.7)
HCT: 28.6 % — ABNORMAL LOW (ref 36.0–46.0)
Hemoglobin: 8.9 g/dL — ABNORMAL LOW (ref 12.0–15.0)
Lymphocytes Relative: 29 % (ref 12–46)
Lymphs Abs: 2 10*3/uL (ref 0.7–4.0)
MCH: 26.1 pg (ref 26.0–34.0)
MCHC: 31.1 g/dL (ref 30.0–36.0)
MCV: 83.9 fL (ref 78.0–100.0)
Monocytes Absolute: 0.5 10*3/uL (ref 0.1–1.0)
Monocytes Relative: 7 % (ref 3–12)
NEUTROS ABS: 4.1 10*3/uL (ref 1.7–7.7)
NEUTROS PCT: 58 % (ref 43–77)
Platelets: 338 10*3/uL (ref 150–400)
RBC: 3.41 MIL/uL — ABNORMAL LOW (ref 3.87–5.11)
RDW: 14.5 % (ref 11.5–15.5)
WBC: 7 10*3/uL (ref 4.0–10.5)

## 2014-03-11 LAB — ETHANOL: Alcohol, Ethyl (B): <11 mg/dL (ref 0–11)

## 2014-03-11 MED ORDER — TRAMADOL HCL 50 MG PO TABS: 50.0000 mg | ORAL_TABLET | Freq: Three times a day (TID) | ORAL | Status: DC | PRN

## 2014-03-11 MED ORDER — SIMVASTATIN 20 MG PO TABS: 20.0000 mg | ORAL_TABLET | Freq: Every day | ORAL | Status: DC

## 2014-03-11 MED ORDER — LORAZEPAM 1 MG PO TABS: 2.0000 mg | ORAL_TABLET | Freq: Four times a day (QID) | ORAL | Status: DC | PRN

## 2014-03-11 MED ORDER — CARVEDILOL 25 MG PO TABS
25.0000 mg | ORAL_TABLET | Freq: Two times a day (BID) | ORAL | Status: DC
  Filled 2014-03-11: qty 1

## 2014-03-11 MED ORDER — TIZANIDINE HCL 4 MG PO TABS
4.0000 mg | ORAL_TABLET | Freq: Three times a day (TID) | ORAL | Status: DC | PRN
  Filled 2014-03-11: qty 1

## 2014-03-11 MED ORDER — QUETIAPINE FUMARATE ER 50 MG PO TB24
150.0000 mg | ORAL_TABLET | Freq: Every day | ORAL | Status: DC
  Filled 2014-03-11 (×2): qty 3

## 2014-03-11 MED ORDER — PANTOPRAZOLE SODIUM 40 MG PO TBEC: 40.0000 mg | DELAYED_RELEASE_TABLET | Freq: Every day | ORAL | Status: DC

## 2014-03-11 MED ORDER — MIRABEGRON ER 25 MG PO TB24: 25.0000 mg | ORAL_TABLET | Freq: Every day | ORAL | Status: DC

## 2014-03-11 MED ORDER — ALBUTEROL SULFATE HFA 108 (90 BASE) MCG/ACT IN AERS: 2.0000 | INHALATION_SPRAY | Freq: Every day | RESPIRATORY_TRACT | Status: DC | PRN

## 2014-03-11 MED ORDER — AMLODIPINE BESYLATE 5 MG PO TABS: 5.0000 mg | ORAL_TABLET | Freq: Every day | ORAL | Status: DC

## 2014-03-11 NOTE — ED Notes (Signed)
Pt has one personal bag and one personal belongings bag all placed in locker #29
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
## 2014-03-11 NOTE — ED Notes (Addendum)
Bedside report received from previous RN, Ivar Drape. Pt is wearing maroon scrubs from previous ED visit on 9/22

## 2014-03-11 NOTE — ED Notes (Addendum)
Pt reports she was just discharged from [facility] on the 22nd.  States it says on her discharge paper to return if she does not feel better.  Pt reports she is having shortness of breath, and cannot talk.  Pt reports when she talked to a "true" psychiatrist prior to discharged and was told that she does not have Bipolar and she does not have schizophrenia.  Pt is talking without difficulty and is able to complete a sentence.  Pt states she cannot talk and that she needs to conserve her breath so she needs to write down what she needs to be seen for today.  Pt is cursing.  She is refusing to tell this nurse the reason for her visit today.  She was told that her sats is 99% and pt states "that doesn't matter."  Pt is no distress.  Family at bedside and while pt is telling this nurse what's going on, family was shaking her head.

## 2014-03-12 MED ORDER — ALBUTEROL SULFATE HFA 108 (90 BASE) MCG/ACT IN AERS: 2.0000 | INHALATION_SPRAY | Freq: Once | RESPIRATORY_TRACT | Status: DC

## 2014-03-12 MED ORDER — TRAMADOL HCL 50 MG PO TABS: 50.0000 mg | ORAL_TABLET | Freq: Once | ORAL | Status: DC

## 2014-03-12 NOTE — Discharge Instructions (Signed)
Depression Depression is feeling sad, low, down in the dumps, blue, gloomy, or empty. In general, there are two kinds of depression:  Normal sadness or grief. This can happen after something upsetting. It often goes away on its own within 2 weeks. After losing a loved one (bereavement), normal sadness and grief may last longer than two weeks. It usually gets better with time.  Clinical depression. This kind lasts longer than normal sadness or grief. It keeps you from doing the things you normally do in life. It is often hard to function at home, work, or at school. It may affect your relationships with others. Treatment is often needed. GET HELP RIGHT AWAY IF:  You have thoughts about hurting yourself or others.  You lose touch with reality (psychotic symptoms). You may:  See or hear things that are not real.  Have untrue beliefs about your life or people around you.  Your medicine is giving you problems. MAKE SURE YOU:  Understand these instructions.  Will watch your condition.  Will get help right away if you are not doing well or get worse. Document Released: 07/07/2010 Document Revised: 10/19/2013 Document Reviewed: 10/04/2011 [employer] Patient Information 2015 [employer]. This information is not intended to replace advice given to you by your health care provider. Make sure you discuss any questions you have with your health care provider.  

## 2014-03-12 NOTE — ED Notes (Signed)
Pt is refusing discharge VS.

## 2014-03-12 NOTE — ED Provider Notes (Signed)
12:23 AM Pt not willing to stay for psych eval, just was seen by psych on 9/22, was cleared. She denies SI/HI and agrees to f/u as outpt. Daughter is agreeable to plan. I feel she is safe for d/c.   1. Depression   2. Psychosis, unspecified psychosis type       Ernestina Patches, MD 03/12/14 [phone]

## 2014-03-12 NOTE — ED Notes (Addendum)
Pt is becoming upset that she cannot take the medications she brought with her. Pt states that she wants to go home and that she had a TTS consult two days ago. Dr. Tawnya Crook notified.

## 2014-03-13 ENCOUNTER — Emergency Department (HOSPITAL_COMMUNITY)
Admission: EM | Admit: 2014-03-13 | Discharge: 2014-03-13 | Disposition: A | Discharge status: Home / Self Care | Payer: Medicare Other | Attending: Emergency Medicine | Admitting: Emergency Medicine

## 2014-03-13 ENCOUNTER — Encounter (HOSPITAL_COMMUNITY): Payer: Self-pay | Admitting: Emergency Medicine

## 2014-03-13 ENCOUNTER — Emergency Department (HOSPITAL_COMMUNITY): Payer: Medicare Other

## 2014-03-13 DIAGNOSIS — F209 Schizophrenia, unspecified: Secondary | ICD-10-CM | POA: Diagnosis not present

## 2014-03-13 DIAGNOSIS — Z9889 Other specified postprocedural states: Secondary | ICD-10-CM | POA: Diagnosis not present

## 2014-03-13 DIAGNOSIS — R000 Tachycardia, unspecified: Secondary | ICD-10-CM | POA: Insufficient documentation

## 2014-03-13 DIAGNOSIS — R0989 Other specified symptoms and signs involving the circulatory and respiratory systems: Principal | ICD-10-CM | POA: Insufficient documentation

## 2014-03-13 DIAGNOSIS — Z8673 Personal history of transient ischemic attack (TIA), and cerebral infarction without residual deficits: Secondary | ICD-10-CM | POA: Insufficient documentation

## 2014-03-13 DIAGNOSIS — R0609 Other forms of dyspnea: Secondary | ICD-10-CM | POA: Diagnosis not present

## 2014-03-13 DIAGNOSIS — Z87440 Personal history of urinary (tract) infections: Secondary | ICD-10-CM | POA: Insufficient documentation

## 2014-03-13 DIAGNOSIS — J45909 Unspecified asthma, uncomplicated: Secondary | ICD-10-CM | POA: Insufficient documentation

## 2014-03-13 DIAGNOSIS — Z79899 Other long term (current) drug therapy: Secondary | ICD-10-CM | POA: Diagnosis not present

## 2014-03-13 DIAGNOSIS — Z8739 Personal history of other diseases of the musculoskeletal system and connective tissue: Secondary | ICD-10-CM | POA: Diagnosis not present

## 2014-03-13 DIAGNOSIS — D649 Anemia, unspecified: Secondary | ICD-10-CM | POA: Insufficient documentation

## 2014-03-13 DIAGNOSIS — E669 Obesity, unspecified: Secondary | ICD-10-CM | POA: Insufficient documentation

## 2014-03-13 DIAGNOSIS — E785 Hyperlipidemia, unspecified: Secondary | ICD-10-CM | POA: Insufficient documentation

## 2014-03-13 DIAGNOSIS — IMO0002 Reserved for concepts with insufficient information to code with codable children: Secondary | ICD-10-CM | POA: Diagnosis not present

## 2014-03-13 DIAGNOSIS — K219 Gastro-esophageal reflux disease without esophagitis: Secondary | ICD-10-CM | POA: Diagnosis not present

## 2014-03-13 DIAGNOSIS — Z85528 Personal history of other malignant neoplasm of kidney: Secondary | ICD-10-CM | POA: Diagnosis not present

## 2014-03-13 DIAGNOSIS — R079 Chest pain, unspecified: Secondary | ICD-10-CM | POA: Diagnosis present

## 2014-03-13 DIAGNOSIS — F319 Bipolar disorder, unspecified: Secondary | ICD-10-CM | POA: Insufficient documentation

## 2014-03-13 DIAGNOSIS — Z792 Long term (current) use of antibiotics: Secondary | ICD-10-CM | POA: Diagnosis not present

## 2014-03-13 DIAGNOSIS — R011 Cardiac murmur, unspecified: Secondary | ICD-10-CM | POA: Insufficient documentation

## 2014-03-13 DIAGNOSIS — I10 Essential (primary) hypertension: Secondary | ICD-10-CM | POA: Insufficient documentation

## 2014-03-13 DIAGNOSIS — Z87442 Personal history of urinary calculi: Secondary | ICD-10-CM | POA: Insufficient documentation

## 2014-03-13 DIAGNOSIS — R0600 Dyspnea, unspecified: Secondary | ICD-10-CM

## 2014-03-13 LAB — COMPREHENSIVE METABOLIC PANEL
ALK PHOS: 89 U/L (ref 39–117)
ALT: 22 U/L (ref 0–35)
AST: 27 U/L (ref 0–37)
Albumin: 4.1 g/dL (ref 3.5–5.2)
Anion gap: 14 (ref 5–15)
BILIRUBIN TOTAL: 0.8 mg/dL (ref 0.3–1.2)
BUN: 21 mg/dL (ref 6–23)
CHLORIDE: 100 meq/L (ref 96–112)
CO2: 24 meq/L (ref 19–32)
Calcium: 10.5 mg/dL (ref 8.4–10.5)
Creatinine, Ser: 1.08 mg/dL (ref 0.50–1.10)
GFR calc Af Amer: 60 mL/min — ABNORMAL LOW (ref >90)
GFR, EST NON AFRICAN AMERICAN: 52 mL/min — AB (ref >90)
Glucose, Bld: 90 mg/dL (ref 70–99)
Potassium: 4.3 mEq/L (ref 3.7–5.3)
SODIUM: 138 meq/L (ref 137–147)
Total Protein: 7.5 g/dL (ref 6.0–8.3)

## 2014-03-13 LAB — MAGNESIUM: MAGNESIUM: 2.1 mg/dL (ref 1.5–2.5)

## 2014-03-13 LAB — TROPONIN I

## 2014-03-13 LAB — CBC
HCT: 30.5 % — ABNORMAL LOW (ref 36.0–46.0)
Hemoglobin: 9.9 g/dL — ABNORMAL LOW (ref 12.0–15.0)
MCH: 26.6 pg (ref 26.0–34.0)
MCHC: 32.5 g/dL (ref 30.0–36.0)
MCV: 82 fL (ref 78.0–100.0)
Platelets: 319 10*3/uL (ref 150–400)
RBC: 3.72 MIL/uL — AB (ref 3.87–5.11)
RDW: 14.5 % (ref 11.5–15.5)
WBC: 7.1 10*3/uL (ref 4.0–10.5)

## 2014-03-13 LAB — PROTIME-INR
INR: 1.05 (ref 0.00–1.49)
Prothrombin Time: 13.7 seconds (ref 11.6–15.2)

## 2014-03-13 LAB — PRO B NATRIURETIC PEPTIDE: Pro B Natriuretic peptide (BNP): 249.9 pg/mL — ABNORMAL HIGH (ref 0–125)

## 2014-03-13 MED ORDER — TRAMADOL HCL 50 MG PO TABS
50.0000 mg | ORAL_TABLET | Freq: Once | ORAL | Status: AC
  Administered 2014-03-13: 50 mg via ORAL
  Filled 2014-03-13: qty 1

## 2014-03-13 MED ORDER — QUETIAPINE FUMARATE 25 MG PO TABS: 25.0000 mg | ORAL_TABLET | Freq: Two times a day (BID) | ORAL | Status: DC

## 2014-03-13 MED ORDER — QUETIAPINE FUMARATE 25 MG PO TABS
25.0000 mg | ORAL_TABLET | Freq: Once | ORAL | Status: AC
  Administered 2014-03-13: 25 mg via ORAL
  Filled 2014-03-13: qty 1

## 2014-03-13 NOTE — ED Notes (Signed)
Dr Vanita Panda stated it was ok to removed pt from monitor and cords.  Vitals to be checked via dinamap.

## 2014-03-13 NOTE — ED Notes (Signed)
Received report from Dr. Vanita Panda. Will initiate assessment when RN is able to put machine in patient's room.
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
## 2014-03-13 NOTE — ED Notes (Signed)
Patient discharged after much talking and complaining of spasms to her knees.  Talking loudly and does not think she should be going home.

## 2014-03-13 NOTE — Discharge Instructions (Signed)
As discussed, your evaluation has been largely reassuring.  Your pain and difficulty breathing is likely related to bronchitis.  It is important to take all medication, both for your bronchitis, as well as for your bipolar disease.  Please be sure to followup with your primary care physician, and your psychiatrist.

## 2014-03-13 NOTE — ED Provider Notes (Signed)
CSN: [id number]     Arrival date & time 03/13/14  1551 History   First MD Initiated Contact with Patient 03/13/14 1559     Chief Complaint  Patient presents with  . Chest Pain  . Shortness of Breath     (Consider location/radiation/quality/duration/timing/severity/associated sxs/prior Treatment) HPI Patient presents with concern of ongoing chest pain, dyspnea, sore throat. Just complains of nausea, vomiting Symptoms have been present for at least one week, though the clear time of onset is unknown. Since onset patient has been seen here twice, and today arrives in her hospital provided scrubs from her last evaluation. She denies confusion, disorientation, fever. She does endorse generalized discomfort as well as anxiety over her condition. No clear alleviating or exacerbating factors.  Past Medical History  Diagnosis Date  . Chest pain   . Cardiomyopathy   . HTN (hypertension)   . Takotsubo cardiomyopathy   . HLD (hyperlipidemia)   . Asthma   . Fibromyalgia   . DJD (degenerative joint disease)   . Chronic anemia   . Obesity   . Carotid bruit     left  . Psychosis   . Lumbar herniated disc   . UTI (lower urinary tract infection)   . Schizophrenia   . Cancer     renal cell carcinoma  . Heart murmur   . Single kidney   . History of kidney cancer     unknown type. had R kidney removed b/c of CA.   Marland Kitchen Kidney stones   . GERD (gastroesophageal reflux disease)     on rare occas. - uses peptobismol  . Bipolar 1 disorder    Past Surgical History  Procedure Laterality Date  . Nephrectomy  1990's    right, secondary to cyst  . Breast lumpectomy  90's    left, cyst  . Cystectomy  90's    back (left)  . Total knee arthroplasty  2005    right  . Cesarean section  1980    x1  . Cardiac catheterization  2010    no intervention  . Total knee arthroplasty Left 03/25/2013    Procedure: TOTAL KNEE ARTHROPLASTY;  Surgeon: Alta Corning, MD;  Location: [place];  Service:  Orthopedics;  Laterality: Left;   Family History  Problem Relation Age of Onset  . Coronary artery disease Mother 76  . Heart failure Mother     congestive  . Stroke Mother   . Brain cancer Father 16  . Hypertension      siblings  . Aortic stenosis      sibling   History  Substance Use Topics  . Smoking status: Never Smoker   . Smokeless tobacco: Not on file  . Alcohol Use: No     Comment: rare   OB History   Grav Para Term Preterm Abortions TAB SAB Ect Mult Living                 Review of Systems  Constitutional:       Per HPI, otherwise negative  HENT:       Per HPI, otherwise negative  Respiratory:       Per HPI, otherwise negative  Cardiovascular:       Per HPI, otherwise negative  Gastrointestinal: Negative for vomiting.  Endocrine:       Negative aside from HPI  Genitourinary:       Neg aside from HPI   Musculoskeletal:       Per HPI, otherwise negative  Skin: Negative.   Neurological: Negative for syncope.      Allergies  Bee venom; Other; Avelox; and Nsaids  Home Medications   Prior to Admission medications   Medication Sig Start Date End Date Taking? Authorizing Provider  albuterol (PROVENTIL HFA;VENTOLIN HFA) 108 (90 BASE) MCG/ACT inhaler Inhale 2 puffs into the lungs 5 (five) times daily as needed for wheezing or shortness of breath.    Historical Provider, MD  albuterol (PROVENTIL) (2.5 MG/3ML) 0.083% nebulizer solution Take 2.5 mg by nebulization every 4 (four) hours as needed for wheezing or shortness of breath.    Historical Provider, MD  amLODipine (NORVASC) 5 MG tablet Take 5 mg by mouth daily.    Historical Provider, MD  carvedilol (COREG) 25 MG tablet Take 25 mg by mouth 2 (two) times daily with a meal.    Historical Provider, MD  cephALEXin (KEFLEX) 500 MG capsule Take 500 mg by mouth 3 (three) times daily. For 3 doses only    Historical Provider, MD  EPINEPHrine (EPIPEN) 0.3 mg/0.3 mL IJ SOAJ injection Inject 0.3 mg into the muscle as  needed (allergic reaction).    Historical Provider, MD  folic acid (FOLVITE) 1 MG tablet Take 1 mg by mouth daily.    Historical Provider, MD  levocetirizine (XYZAL) 5 MG tablet Take 5 mg by mouth every evening.    Historical Provider, MD  LORazepam (ATIVAN) 2 MG tablet Take 2 mg by mouth every 6 (six) hours as needed for anxiety.    Historical Provider, MD  mirabegron ER (MYRBETRIQ) 25 MG TB24 tablet Take 25 mg by mouth daily.    Historical Provider, MD  mometasone-formoterol (DULERA) 100-5 MCG/ACT AERO Inhale 2 puffs into the lungs 2 (two) times daily.    Historical Provider, MD  Multiple Vitamin (MULTIVITAMIN WITH MINERALS) TABS tablet Take 1 tablet by mouth daily with lunch.    Historical Provider, MD  Omega-3 Fatty Acids (FISH OIL) 1000 MG CAPS Take 1 capsule by mouth daily.    Historical Provider, MD  omeprazole (PRILOSEC) 20 MG capsule Take 1 capsule by mouth 2 (two) times daily. 12/30/13   Historical Provider, MD  ondansetron (ZOFRAN-ODT) 4 MG disintegrating tablet Take 4 mg by mouth every 6 (six) hours as needed for nausea or vomiting.    Historical Provider, MD  OVER THE COUNTER MEDICATION Take 4 tablets by mouth daily. Papaya supplement for nausea    Historical Provider, MD  polyethylene glycol (MIRALAX / GLYCOLAX) packet Take 17 g by mouth daily as needed (constipation). Mix in 8 oz liquid and drink    Historical Provider, MD  potassium chloride SA (K-DUR,KLOR-CON) 20 MEQ tablet Take 40 mEq by mouth daily.    Historical Provider, MD  pravastatin (PRAVACHOL) 40 MG tablet Take 40 mg by mouth daily.    Historical Provider, MD  QUEtiapine Fumarate (SEROQUEL XR) 150 MG 24 hr tablet Take 150 mg by mouth at bedtime.    Historical Provider, MD  tiZANidine (ZANAFLEX) 4 MG tablet Take 4 mg by mouth every 8 (eight) hours as needed for muscle spasms.    Historical Provider, MD  traMADol (ULTRAM) 50 MG tablet Take 50 mg by mouth 3 (three) times daily as needed (pain).    Historical Provider, MD   VITAMIN D, CHOLECALCIFEROL, PO Take 10,000 Units by mouth daily.    Historical Provider, MD  ziprasidone (GEODON) injection Inject 20 mg into the muscle once.    Historical Provider, MD   BP 156/109  Pulse 112  Resp 14  SpO2 100% Physical Exam  Nursing note and vitals reviewed. Constitutional: She is oriented to person, place, and time. She appears well-developed and well-nourished. No distress.  HENT:  Head: Normocephalic and atraumatic.  Eyes: Conjunctivae and EOM are normal.  Cardiovascular: Regular rhythm.  Tachycardia present.   Pulmonary/Chest: No stridor. Tachypnea noted. She has decreased breath sounds.  Abdominal: She exhibits no distension.  Musculoskeletal: She exhibits no edema.  Neurological: She is alert and oriented to person, place, and time. No cranial nerve deficit.  Skin: Skin is warm and dry.  Psychiatric: She has a normal mood and affect.    ED Course  Procedures (including critical care time) Labs Review Labs Reviewed  CBC - Abnormal; Notable for the following:    RBC 3.72 (*)    Hemoglobin 9.9 (*)    HCT 30.5 (*)    All other components within normal limits  PROTIME-INR  COMPREHENSIVE METABOLIC PANEL  PRO B NATRIURETIC PEPTIDE  MAGNESIUM  TROPONIN I    Imaging Review Dg Chest 2 View  03/11/2014   CLINICAL DATA:  Shortness of breath, cough, and congestion.  EXAM: CHEST  2 VIEW  COMPARISON:  March 09, 2014  FINDINGS: The heart size and mediastinal contours are within normal limits. There is no focal infiltrate, pulmonary edema, or pleural effusion. There is chronic elevation of right hemidiaphragm. The visualized skeletal structures are unremarkable.  IMPRESSION: No active cardiopulmonary disease.   Electronically Signed   By: Abelardo Diesel M.D.   On: 03/11/2014 20:55     EKG Interpretation   Date/Time:  Saturday March 13 2014 16:09:22 EDT Ventricular Rate:  109 PR Interval:  123 QRS Duration: 82 QT Interval:  340 QTC Calculation:  458 R Axis:   59 Text Interpretation:  Sinus tachycardia Atrial premature complex  Borderline T abnormalities, anterior leads Baseline wander in lead(s) II  III aVR aVL aVF Sinus tachycardia T wave abnormality Abnormal ekg  Confirmed by Carmin Muskrat  MD ([id number]) on 03/13/2014 4:34:09 PM     5:16 PM Patient particularly agitated, standing up, wearing no pants. She is mostly redirectable - states that she wants help.  She then quickly loses focus and has tangential speech. Patient has two family members with her now, and they add that the patient has been acting more strangely recently, with less predictable behavior, outbursts, and questionable medication compliance.  A review of the EMR demonstrates recent eval (x2) and psych hospitalization with d/c following statement of no indication for additional inpatient services. Patient was d/c w seroquel  9:05 PM Patient has been cooperative, after the initial episode. I discussed the patient's case again with our behavioral health team. Patient does not meet inpatient criteria for further psychiatric evaluation. Patient's labs are also reassuring for the low suspicion of ongoing coronary event. With some concern for bronchitis, patient will be taking scheduled albuterol therapy, short course of steroids.  MDM   Patient presents for the third time in the past 2 weeks with ongoing episodes of chest pain, as well as atypical behavior. Though the patient has tangential speech, is anxious, inconsistently cooperative, patient denies any suicidal or homicidal ideation, is not actively hallucinating, and after discussion with behavioral health, does not meet any criteria for further inpatient psychiatric evaluation. Patient was started on her recently prescribed home antipsychotic medication, Seroquel while in the emergency department tonight. I had multiple lengthy conversations with the patient's family members about her health, both medical and  psychiatric, as well as  the need to encourage patient to pursue close outpatient followup, and to be compliant with all medications. Absent distress, with reassuring findings, the patient is appropriate for further evaluation, management as an outpatient.    Carmin Muskrat, MD 03/13/14 2107

## 2014-03-13 NOTE — ED Notes (Addendum)
Pt states she has "3 elephants sitting on her chest."  Pt is a poor historian and difficult to redirect to focus for answers to questions at this time.

## 2014-03-13 NOTE — Consult Note (Signed)
  TTS evaluation notes no significant change from her assessment 2 days ago and her consult by Earleen Newport NP and Dr Lovena Le.No HI/SI. No risk. Recommendations as before for OP followup and assessment of memory to R/O  Alzheimer's although she is fully oriented at this visit.

## 2014-03-13 NOTE — ED Notes (Signed)
Dr Vanita Panda at bedside speaking with pt and family.

## 2014-03-13 NOTE — ED Notes (Signed)
Patient being evaluated by TTS °

## 2014-03-13 NOTE — ED Notes (Signed)
Pt standing up, refusing to get back in bed, belligerent with this RN.  Pt states she has to use to the restroom but refuses to walk to the bathroom, refuses to use a bedpan in the bed.  Pt placed chucks between her legs and began urinating while standing, then took a bedpan to urinate standing up.  Dr Vanita Panda notified and outside the room door speaking with family.

## 2014-03-13 NOTE — ED Notes (Signed)
Patient did not sign  Patient very anxious and continually talking

## 2014-03-13 NOTE — BH Assessment (Signed)
Tele Assessment Note   Heidi Young is an 68 y.o. female who presents to [facility] Emergency Department with the chief complaint of medical issues related to chest pain. She stated that she has consistently suffered from depression and is currently residing at a hotel due to her home being "infested with bugs". Patient reported that she was previously in the emergency department and returned today due to worsening symptoms. She stated that she desires to "get well" although her depression continues to worsen due to medical complications. Patient exhibited tangential speech AEB need for redirection and clarification throughout the assessment to obtain information. Patient endorses depressive symptoms that include insomnia, social isolation, and fatigue. She denied SI/HI but did acknowledge visual hallucinations at times that include "seeing shadows and black spots". Patient then minimized her AH by stating that she presumes they relate to her vision impairment. Patient denied having any current outpatient providers at this time but stated she is willing to follow up with someone. Patient stated that she has been hospitalized before "because of a lie" at [facility], [facility], and [facility] for unspecified reasons.  Patient denies current SA use but does report use of THC in the past. Patient reported moments of moderate anxiety and acknowledged having sweats and moments of being "light headed".    Axis I: Major Depression, Recurrent severe Axis II: Deferred Axis III:  Past Medical History  Diagnosis Date  . Chest pain   . Cardiomyopathy   . HTN (hypertension)   . Takotsubo cardiomyopathy   . HLD (hyperlipidemia)   . Asthma   . Fibromyalgia   . DJD (degenerative joint disease)   . Chronic anemia   . Obesity   . Carotid bruit     left  . Psychosis   . Lumbar herniated disc   . UTI (lower urinary tract infection)   . Schizophrenia   . Cancer     renal cell carcinoma   . Heart murmur   . Single kidney   . History of kidney cancer     unknown type. had R kidney removed b/c of CA.   Marland Kitchen Kidney stones   . GERD (gastroesophageal reflux disease)     on rare occas. - uses peptobismol  . Bipolar 1 disorder    Axis IV: housing problems and other psychosocial or environmental problems Axis V: 61-70 mild symptoms  Past Medical History:  Past Medical History  Diagnosis Date  . Chest pain   . Cardiomyopathy   . HTN (hypertension)   . Takotsubo cardiomyopathy   . HLD (hyperlipidemia)   . Asthma   . Fibromyalgia   . DJD (degenerative joint disease)   . Chronic anemia   . Obesity   . Carotid bruit     left  . Psychosis   . Lumbar herniated disc   . UTI (lower urinary tract infection)   . Schizophrenia   . Cancer     renal cell carcinoma  . Heart murmur   . Single kidney   . History of kidney cancer     unknown type. had R kidney removed b/c of CA.   Marland Kitchen Kidney stones   . GERD (gastroesophageal reflux disease)     on rare occas. - uses peptobismol  . Bipolar 1 disorder     Past Surgical History  Procedure Laterality Date  . Nephrectomy  1990's    right, secondary to cyst  . Breast lumpectomy  90's    left, cyst  .  Cystectomy  90's    back (left)  . Total knee arthroplasty  2005    right  . Cesarean section  1980    x1  . Cardiac catheterization  2010    no intervention  . Total knee arthroplasty Left 03/25/2013    Procedure: TOTAL KNEE ARTHROPLASTY;  Surgeon: Alta Corning, MD;  Location: [place];  Service: Orthopedics;  Laterality: Left;    Family History:  Family History  Problem Relation Age of Onset  . Coronary artery disease Mother 50  . Heart failure Mother     congestive  . Stroke Mother   . Brain cancer Father 76  . Hypertension      siblings  . Aortic stenosis      sibling    Social History:  reports that she has never smoked. She does not have any smokeless tobacco history on file. She reports that she uses illicit  drugs (Marijuana) about once per week. She reports that she does not drink alcohol.  Additional Social History:     CIWA: CIWA-Ar BP: 144/65 mmHg Pulse Rate: 114 COWS:    PATIENT STRENGTHS: (choose at least two) Ability for insight Supportive family/friends  Allergies:  Allergies  Allergen Reactions  . Bee Venom Anaphylaxis  . Other Anaphylaxis    Red ant's, roaches   . Avelox [Moxifloxacin Hydrochloride] Rash and Other (See Comments)    Fever / Upset Stomach  . Nsaids Other (See Comments)    Due to stomach ulcers    Home Medications:  (Not in a hospital admission)  OB/GYN Status:  No LMP recorded. Patient is postmenopausal.  General Assessment Data Location of Assessment: [facility] Is this a Tele or Face-to-Face Assessment?: Tele Assessment Is this an Initial Assessment or a Re-assessment for this encounter?: Initial Assessment Living Arrangements: Other (Comment) (Hotel) Can pt return to current living arrangement?: Yes Admission Status: Voluntary Is patient capable of signing voluntary admission?: Yes Transfer from: [facility] Referral Source: Self/Family/Friend     [place] Living Arrangements: Other (Comment) [facility]) Name of Psychiatrist: None Name of Therapist: None  Education Status Is patient currently in school?: No  Risk to self with the past 6 months Suicidal Ideation: No Suicidal Intent: No Is patient at risk for suicide?: No Suicidal Plan?: No Access to Means: No Previous Attempts/Gestures: No How many times?: 0 Triggers for Past Attempts: None known Intentional Self Injurious Behavior: None Family Suicide History: Unknown Recent stressful life event(s): Other (Comment) (Displacement from home to hotel) Persecutory voices/beliefs?: No Depression: Yes Depression Symptoms: Insomnia;Tearfulness;Isolating;Feeling worthless/self pity Substance abuse history and/or treatment for substance abuse?: No  Risk to  Others within the past 6 months Homicidal Ideation: No Thoughts of Harm to Others: No Current Homicidal Intent: No Current Homicidal Plan: No Access to Homicidal Means: No Identified Victim: None History of harm to others?: No Assessment of Violence: None Noted Violent Behavior Description: Pt is calm and cooperative Does patient have access to weapons?: No Criminal Charges Pending?: No Does patient have a court date: No  Psychosis Hallucinations: Visual Delusions: None noted  Mental Status Report Appear/Hygiene: Disheveled Eye Contact: Poor Motor Activity: Freedom of movement Speech: Soft;Slow Level of Consciousness: Quiet/awake Mood: Depressed;Helpless Affect: Depressed Anxiety Level: Moderate Thought Processes: Coherent;Tangential Judgement: Impaired Orientation: Person;Place;Time;Situation Obsessive Compulsive Thoughts/Behaviors: None  Cognitive Functioning Concentration: Decreased Memory: Recent Intact IQ: Average Insight: Fair Impulse Control: Fair Appetite: Fair Weight Loss: 0 Weight Gain: 0 Sleep: Decreased Total Hours of Sleep:  (  Unknown by patient) Vegetative Symptoms: None  ADLScreening [facility] Assessment Services) Patient's cognitive ability adequate to safely complete daily activities?: Yes Patient able to express need for assistance with ADLs?: Yes Independently performs ADLs?:  (UTA)  Prior Inpatient Therapy Prior Inpatient Therapy: Yes Prior Therapy Dates: 2015 Prior Therapy Facilty/Provider(s): [facility] Reason for Treatment: MH issues  Prior Outpatient Therapy Prior Outpatient Therapy: No  ADL Screening (condition at time of admission) Patient's cognitive ability adequate to safely complete daily activities?: Yes Is the patient deaf or have difficulty hearing?: No Does the patient have difficulty seeing, even when wearing glasses/contacts?: Yes Does the patient have difficulty concentrating, remembering, or making decisions?: No Patient able to  express need for assistance with ADLs?: Yes Does the patient have difficulty dressing or bathing?:  (UTA) Independently performs ADLs?:  (UTA) Does the patient have difficulty walking or climbing stairs?:  (UTA) Weakness of Legs:  (UTA) Weakness of Arms/Hands: None     Therapy Consults (therapy consults require a physician order) PT Evaluation Needed: No OT Evalulation Needed: No SLP Evaluation Needed: No Abuse/Neglect Assessment (Assessment to be complete while patient is alone) Physical Abuse: Denies Verbal Abuse: Denies Sexual Abuse: Denies Exploitation of patient/patient's resources: Denies Self-Neglect: Denies Values / Beliefs Cultural Requests During Hospitalization: None Spiritual Requests During Hospitalization: None Consults Spiritual Care Consult Needed: No Social Work Consult Needed: No Regulatory affairs officer (For Healthcare) Does patient have an advance directive?: No Would patient like information on creating an advanced directive?: No - patient declined information    Additional Information 1:1 In Past 12 Months?: No CIRT Risk: No Elopement Risk: No Does patient have medical clearance?: Yes     Disposition: Consulted with Darlyne Russian, PA-C who states that patient does not meet inpatient criteria and recommends outpatient psychiatry information to be provided to patient upon discharge. MD notified by Probation officer.    Disposition Initial Assessment Completed for this Encounter: Yes  Harriet Masson 03/13/2014 8:37 PM

## 2014-03-13 NOTE — ED Notes (Signed)
Patient transported to X-ray 

## 2014-03-13 NOTE — ED Notes (Addendum)
Pt refusing chest x-ray and becoming excessively loud.  Notified Dr Vanita Panda.

## 2014-03-13 NOTE — ED Notes (Addendum)
Family stepped out of the room and asked this RN to enter and observe pt taking medication from her purse.  Family reports pt takes all of her medication every hour.  Bag with medications and belongings removed from room and given to family. Dr Vanita Panda notified.

## 2014-03-23 ENCOUNTER — Encounter (HOSPITAL_COMMUNITY): Payer: Self-pay | Admitting: Emergency Medicine

## 2014-03-23 ENCOUNTER — Emergency Department (HOSPITAL_COMMUNITY)
Admission: EM | Admit: 2014-03-23 | Discharge: 2014-03-23 | Disposition: A | Discharge status: Home / Self Care | Payer: Medicare Other | Attending: Emergency Medicine | Admitting: Emergency Medicine

## 2014-03-23 ENCOUNTER — Emergency Department (HOSPITAL_COMMUNITY): Payer: Medicare Other

## 2014-03-23 DIAGNOSIS — Z87442 Personal history of urinary calculi: Secondary | ICD-10-CM | POA: Insufficient documentation

## 2014-03-23 DIAGNOSIS — Z85828 Personal history of other malignant neoplasm of skin: Secondary | ICD-10-CM | POA: Diagnosis not present

## 2014-03-23 DIAGNOSIS — W1830XA Fall on same level, unspecified, initial encounter: Secondary | ICD-10-CM | POA: Diagnosis not present

## 2014-03-23 DIAGNOSIS — Z905 Acquired absence of kidney: Secondary | ICD-10-CM | POA: Insufficient documentation

## 2014-03-23 DIAGNOSIS — Z7951 Long term (current) use of inhaled steroids: Secondary | ICD-10-CM | POA: Insufficient documentation

## 2014-03-23 DIAGNOSIS — Z79899 Other long term (current) drug therapy: Secondary | ICD-10-CM | POA: Insufficient documentation

## 2014-03-23 DIAGNOSIS — E785 Hyperlipidemia, unspecified: Secondary | ICD-10-CM | POA: Diagnosis not present

## 2014-03-23 DIAGNOSIS — F209 Schizophrenia, unspecified: Secondary | ICD-10-CM | POA: Diagnosis not present

## 2014-03-23 DIAGNOSIS — F319 Bipolar disorder, unspecified: Secondary | ICD-10-CM | POA: Diagnosis not present

## 2014-03-23 DIAGNOSIS — R011 Cardiac murmur, unspecified: Secondary | ICD-10-CM | POA: Insufficient documentation

## 2014-03-23 DIAGNOSIS — Z87440 Personal history of urinary (tract) infections: Secondary | ICD-10-CM | POA: Diagnosis not present

## 2014-03-23 DIAGNOSIS — R0781 Pleurodynia: Secondary | ICD-10-CM

## 2014-03-23 DIAGNOSIS — M797 Fibromyalgia: Secondary | ICD-10-CM | POA: Insufficient documentation

## 2014-03-23 DIAGNOSIS — M1990 Unspecified osteoarthritis, unspecified site: Secondary | ICD-10-CM | POA: Diagnosis not present

## 2014-03-23 DIAGNOSIS — D649 Anemia, unspecified: Secondary | ICD-10-CM | POA: Insufficient documentation

## 2014-03-23 DIAGNOSIS — Y9389 Activity, other specified: Secondary | ICD-10-CM | POA: Insufficient documentation

## 2014-03-23 DIAGNOSIS — Y92410 Unspecified street and highway as the place of occurrence of the external cause: Secondary | ICD-10-CM | POA: Insufficient documentation

## 2014-03-23 DIAGNOSIS — Z85528 Personal history of other malignant neoplasm of kidney: Secondary | ICD-10-CM | POA: Diagnosis not present

## 2014-03-23 DIAGNOSIS — J45909 Unspecified asthma, uncomplicated: Secondary | ICD-10-CM | POA: Insufficient documentation

## 2014-03-23 DIAGNOSIS — S79912A Unspecified injury of left hip, initial encounter: Secondary | ICD-10-CM | POA: Insufficient documentation

## 2014-03-23 DIAGNOSIS — I10 Essential (primary) hypertension: Secondary | ICD-10-CM | POA: Insufficient documentation

## 2014-03-23 DIAGNOSIS — S299XXA Unspecified injury of thorax, initial encounter: Secondary | ICD-10-CM | POA: Insufficient documentation

## 2014-03-23 DIAGNOSIS — E669 Obesity, unspecified: Secondary | ICD-10-CM | POA: Insufficient documentation

## 2014-03-23 DIAGNOSIS — M25552 Pain in left hip: Secondary | ICD-10-CM

## 2014-03-23 DIAGNOSIS — K219 Gastro-esophageal reflux disease without esophagitis: Secondary | ICD-10-CM | POA: Diagnosis not present

## 2014-03-23 DIAGNOSIS — S3991XA Unspecified injury of abdomen, initial encounter: Secondary | ICD-10-CM | POA: Diagnosis present

## 2014-03-23 LAB — URINE MICROSCOPIC-ADD ON

## 2014-03-23 LAB — URINALYSIS, ROUTINE W REFLEX MICROSCOPIC
Bilirubin Urine: NEGATIVE
Glucose, UA: NEGATIVE mg/dL
Hgb urine dipstick: NEGATIVE
KETONES UR: NEGATIVE mg/dL
LEUKOCYTES UA: NEGATIVE
Nitrite: NEGATIVE
PH: 5.5 (ref 5.0–8.0)
Protein, ur: 30 mg/dL — AB
SPECIFIC GRAVITY, URINE: 1.023 (ref 1.005–1.030)
Urobilinogen, UA: 0.2 mg/dL (ref 0.0–1.0)

## 2014-03-23 MED ORDER — TRAMADOL HCL 50 MG PO TABS
50.0000 mg | ORAL_TABLET | Freq: Once | ORAL | Status: AC
  Administered 2014-03-23: 50 mg via ORAL
  Filled 2014-03-23: qty 1

## 2014-03-23 MED ORDER — ACETAMINOPHEN 325 MG PO TABS
650.0000 mg | ORAL_TABLET | Freq: Once | ORAL | Status: AC
  Administered 2014-03-23: 650 mg via ORAL
  Filled 2014-03-23: qty 2

## 2014-03-23 NOTE — ED Notes (Addendum)
Patient reports chest tightness with inspiration, left flank pain 10/10. Denies N/V/D at this time. PA-C at bedside.

## 2014-03-23 NOTE — Discharge Instructions (Signed)
Hip Pain Your hip is the joint between your upper legs and your lower pelvis. The bones, cartilage, tendons, and muscles of your hip joint perform a lot of work each day supporting your body weight and allowing you to move around. Hip pain can range from a minor ache to severe pain in one or both of your hips. Pain may be felt on the inside of the hip joint near the groin, or the outside near the buttocks and upper thigh. You may have swelling or stiffness as well.  HOME CARE INSTRUCTIONS   Take medicines only as directed by your health care provider.  Apply ice to the injured area:  Put ice in a plastic bag.  Place a towel between your skin and the bag.  Leave the ice on for 15-20 minutes at a time, 3-4 times a day.  Keep your leg raised (elevated) when possible to lessen swelling.  Avoid activities that cause pain.  Follow specific exercises as directed by your health care provider.  Sleep with a pillow between your legs on your most comfortable side.  Record how often you have hip pain, the location of the pain, and what it feels like. SEEK MEDICAL CARE IF:   You are unable to put weight on your leg.  Your hip is red or swollen or very tender to touch.  Your pain or swelling continues or worsens after 1 week.  You have increasing difficulty walking.  You have a fever. SEEK IMMEDIATE MEDICAL CARE IF:   You have fallen.  You have a sudden increase in pain and swelling in your hip. MAKE SURE YOU:   Understand these instructions.  Will watch your condition.  Will get help right away if you are not doing well or get worse. Document Released: 11/22/2009 Document Revised: 10/19/2013 Document Reviewed: 01/29/2013 [facility] Patient Information 2015 [place], [place]. This information is not intended to replace advice given to you by your health care provider. Make sure you discuss any questions you have with your health care provider. Rib Contusion A rib contusion (bruise)  can occur by a blow to the chest or by a fall against a hard object. Usually these will be much better in a couple weeks. If X-rays were taken today and there are no broken bones (fractures), the diagnosis of bruising is made. However, broken ribs may not show up for several days, or may be discovered later on a routine X-ray when signs of healing show up. If this happens to you, it does not mean that something was missed on the X-ray, but simply that it did not show up on the first X-rays. Earlier diagnosis will not usually change the treatment. HOME CARE INSTRUCTIONS   Avoid strenuous activity. Be careful during activities and avoid bumping the injured ribs. Activities that pull on the injured ribs and cause pain should be avoided, if possible.  For the first day or two, an ice pack used every 20 minutes while awake may be helpful. Put ice in a plastic bag and put a towel between the bag and the skin.  Eat a normal, well-balanced diet. Drink plenty of fluids to avoid constipation.  Take deep breaths several times a day to keep lungs free of infection. Try to cough several times a day. Splint the injured area with a pillow while coughing to ease pain. Coughing can help prevent pneumonia.  Wear a rib belt or binder only if told to do so by your caregiver. If you are wearing  a rib belt or binder, you must do the breathing exercises as directed by your caregiver. If not used properly, rib belts or binders restrict breathing which can lead to pneumonia.  Only take over-the-counter or prescription medicines for pain, discomfort, or fever as directed by your caregiver. SEEK MEDICAL CARE IF:   You or your child has an oral temperature above 102 F (38.9 C).  Your baby is older than 3 months with a rectal temperature of 100.5 F (38.1 C) or higher for more than 1 day.  You develop a cough, with thick or bloody sputum. SEEK IMMEDIATE MEDICAL CARE IF:   You have difficulty breathing.  You feel sick  to your stomach (nausea), have vomiting or belly (abdominal) pain.  You have worsening pain, not controlled with medications, or there is a change in the location of the pain.  You develop sweating or radiation of the pain into the arms, jaw or shoulders, or become light headed or faint.  You or your child has an oral temperature above 102 F (38.9 C), not controlled by medicine.  Your or your baby is older than 3 months with a rectal temperature of 102 F (38.9 C) or higher.  Your baby is 77 months old or younger with a rectal temperature of 100.4 F (38 C) or higher. MAKE SURE YOU:   Understand these instructions.  Will watch your condition.  Will get help right away if you are not doing well or get worse. Document Released: 02/27/2001 Document Revised: 09/29/2012 Document Reviewed: 01/21/2008 [facility] Patient Information 2015 [place], [place]. This information is not intended to replace advice given to you by your health care provider. Make sure you discuss any questions you have with your health care provider.
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
## 2014-03-23 NOTE — ED Notes (Signed)
Bed: TK18 Expected date:  Expected time:  Means of arrival:  Comments: EMS 68 yo female-pain-dx pneumonia last week-did not get Rx filled

## 2014-03-23 NOTE — ED Notes (Signed)
Patient transported to X-ray 

## 2014-03-23 NOTE — ED Notes (Signed)
MD at bedside. 
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
## 2014-03-23 NOTE — ED Notes (Signed)
MD at bedside. EDPA ROBYN IN TO REEVALUATE PT

## 2014-03-23 NOTE — ED Notes (Signed)
Patient presents via [facility] for left flank/rib pain x1 month. Increased pain with movement and inspiration. Patient was seen at [facility] for same approximately x1 month ago for same and diagnosed with pneumonia. A&O x 4.   VS BP 160/100, HR 92, Resp 20.

## 2014-03-23 NOTE — ED Notes (Addendum)
Patient transported to X-ray 

## 2014-03-24 ENCOUNTER — Emergency Department (HOSPITAL_COMMUNITY)
Admission: EM | Admit: 2014-03-24 | Discharge: 2014-03-25 | Disposition: A | Discharge status: Home / Self Care | Payer: Medicare Other | Attending: Emergency Medicine | Admitting: Emergency Medicine

## 2014-03-24 ENCOUNTER — Ambulatory Visit (HOSPITAL_COMMUNITY)
Admission: RE | Admit: 2014-03-24 | Discharge: 2014-03-24 | Disposition: A | Discharge status: Home / Self Care | Payer: Medicare Other | Attending: Psychiatry | Admitting: Psychiatry

## 2014-03-24 DIAGNOSIS — I10 Essential (primary) hypertension: Secondary | ICD-10-CM | POA: Insufficient documentation

## 2014-03-24 DIAGNOSIS — R011 Cardiac murmur, unspecified: Secondary | ICD-10-CM | POA: Insufficient documentation

## 2014-03-24 DIAGNOSIS — E785 Hyperlipidemia, unspecified: Secondary | ICD-10-CM | POA: Diagnosis not present

## 2014-03-24 DIAGNOSIS — J45909 Unspecified asthma, uncomplicated: Secondary | ICD-10-CM | POA: Insufficient documentation

## 2014-03-24 DIAGNOSIS — E669 Obesity, unspecified: Secondary | ICD-10-CM | POA: Insufficient documentation

## 2014-03-24 DIAGNOSIS — Z9889 Other specified postprocedural states: Secondary | ICD-10-CM | POA: Insufficient documentation

## 2014-03-24 DIAGNOSIS — Z87442 Personal history of urinary calculi: Secondary | ICD-10-CM | POA: Diagnosis not present

## 2014-03-24 DIAGNOSIS — F200 Paranoid schizophrenia: Secondary | ICD-10-CM | POA: Diagnosis present

## 2014-03-24 DIAGNOSIS — K219 Gastro-esophageal reflux disease without esophagitis: Secondary | ICD-10-CM | POA: Insufficient documentation

## 2014-03-24 DIAGNOSIS — Z85528 Personal history of other malignant neoplasm of kidney: Secondary | ICD-10-CM | POA: Diagnosis not present

## 2014-03-24 DIAGNOSIS — M797 Fibromyalgia: Secondary | ICD-10-CM | POA: Diagnosis not present

## 2014-03-24 DIAGNOSIS — Z85828 Personal history of other malignant neoplasm of skin: Secondary | ICD-10-CM | POA: Insufficient documentation

## 2014-03-24 DIAGNOSIS — Z905 Acquired absence of kidney: Secondary | ICD-10-CM | POA: Diagnosis not present

## 2014-03-24 DIAGNOSIS — Z87440 Personal history of urinary (tract) infections: Secondary | ICD-10-CM | POA: Insufficient documentation

## 2014-03-24 DIAGNOSIS — F319 Bipolar disorder, unspecified: Secondary | ICD-10-CM | POA: Diagnosis not present

## 2014-03-24 DIAGNOSIS — Z79899 Other long term (current) drug therapy: Secondary | ICD-10-CM | POA: Insufficient documentation

## 2014-03-24 DIAGNOSIS — D649 Anemia, unspecified: Secondary | ICD-10-CM | POA: Diagnosis not present

## 2014-03-24 DIAGNOSIS — F919 Conduct disorder, unspecified: Secondary | ICD-10-CM | POA: Diagnosis not present

## 2014-03-24 DIAGNOSIS — R4689 Other symptoms and signs involving appearance and behavior: Secondary | ICD-10-CM
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
## 2014-03-24 NOTE — ED Notes (Signed)
Pt provided food.

## 2014-03-24 NOTE — ED Notes (Signed)
Attempts made to contact patients sister, no answer.
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
## 2014-03-24 NOTE — ED Notes (Signed)
Pt presents from [facility], pt continually yelling at staff for an additional epi pen, pt states she is allergic to bees and walked by ants on sidewalk, causing an allergic reaction. Pt refusing to give any information only loudly demanding 2-3 more epi pens. No rash or hives noted, no speaking in clear, loud sentences. Pts sister Donell Sievert [phone] would like someone to call and speak with her about her sisters care.

## 2014-03-25 NOTE — ED Notes (Signed)
Per patients sister Evelena Peat she has made multiple attempts to get patient to get her to move to her home but pt refuses. Pt becoming very agitated with staff. Pt yelling at staff, cursing. Sister Izora Gala [phone] called for ride, message left.

## 2014-03-25 NOTE — Discharge Instructions (Signed)
°Emergency Department Resource Guide °1) Find a Doctor and Pay Out of Pocket °Although you won't have to find out who is covered by your insurance plan, it is a good idea to ask around and get recommendations. You will then need to call the office and see if the doctor you have chosen will accept you as a new patient and what types of options they offer for patients who are self-pay. Some doctors offer discounts or will set up payment plans for their patients who do not have insurance, but you will need to ask so you aren't surprised when you get to your appointment. ° °2) Contact Your Local Health Department °Not all health departments have doctors that can see patients for sick visits, but many do, so it is worth a call to see if yours does. If you don't know where your local health department is, you can check in your phone book. The CDC also has a tool to help you locate your state's health department, and many state websites also have listings of all of their local health departments. ° °3) Find a Walk-in Clinic °If your illness is not likely to be very severe or complicated, you may want to try a walk in clinic. These are popping up all over the country in pharmacies, drugstores, and shopping centers. They're usually staffed by nurse practitioners or physician assistants that have been trained to treat common illnesses and complaints. They're usually fairly quick and inexpensive. However, if you have serious medical issues or chronic medical problems, these are probably not your best option. ° °No Primary Care Doctor: °- Call [facility] at  832-8000 - they can help you locate a primary care doctor that  accepts your insurance, provides certain services, etc. °- Physician Referral Service- 1-800-533-3463 ° °Chronic Pain Problems: °Organization         Address  Phone   Notes  °[facility]  (336) 297-2271 Patients need to be referred by their primary care doctor.  ° °Medication  Assistance: °Organization         Address  Phone   Notes  °[place] [facility] [address] °[place], [place] [address] (336) 641-8030 --Must be a resident of [place] °-- Must have NO insurance coverage whatsoever (no Medicaid/ Medicare, etc.) °-- The pt. MUST have a primary care doctor that directs their care regularly and follows them in the community °  °[facility]  (866) 331-1348   °[employer]  (888) 892-1162   ° °Agencies that provide inexpensive medical care: °Organization         Address  Phone   Notes  °[facility]  (336) 832-8035   °[facility]    (336) 832-7272   °[facility] [address] °[place],  [address] (336) 832-4777   °[facility] [address], °[place] (336) 271-4999   °[facility]    (336) 373-0678   °[facility]    (336) 272-1050   °[facility] ° [address], [place] Phone:  (336) 832-4444, Fax:  (336) 832-4440 Hours of Operation:  9 am - 6 pm, M-F.  Also accepts Medicaid/Medicare and self-pay.  °[facility] ° [address], [place] Phone: (336) 832-3150, Fax: (336) 832-3151. Hours of Operation:  8:30 am - 5:30 pm, M-F.  Also accepts Medicaid and self-pay.  °[facility] [place] [address], [place] Phone: (336) 878-6027   °[facility] [address], [place], [place] (336)723-1848, Ext. 123 Mondays & Thursdays: 7-9 AM.  First 15 patients are seen on a first come, first serve basis. °  ° °Medicaid-accepting [place] Providers: ° °Organization         Address  Phone   Notes  °[facility] [address], [place] (336) 641-2100 Also accepts self-pay patients.  °[facility] [address], [place] ° (336) 856-9996   °[facility] [address], [place]  (336) 288-8857   °[facility] [address] [place] [address], [place] (336) 299-7000   °Veita Bland [address], [place]  ° (336) 373-1557 Only accepts [place] Access Medicaid patients after they have their name applied to their card.  ° °Self-Pay (no insurance) in [place]: ° °Organization         Address  Phone   Notes  °Sickle Cell Patients, [facility] [address], [place] (336) 832-1970   °[facility] [address], [place] (336) 832-4400   °[facility] ° [address], [place] (336) 992-4800   °[facility]/Dr. Osei-Bonsu ° [address] [place] [address], [place] or [address], [place] (336) 841-8500 Phone number for both [place] and [place] locations is the same.  °[facility] [address], [place] (336) 299-0000   °[facility] [address] [place] [address], [place] or [address] (336) 852-7530 °(336) 878-2260   °[facility] [address], [place] (336) 350-1642, phone; (336) 294-5005, fax Sees patients 1st and 3rd Saturday of every month.  Must not qualify for public or private insurance (i.e. Medicaid, Medicare, [place] Health Choice, Veterans' Benefits) • Household income should be no more than 200% of the poverty level •The clinic cannot treat you if you are pregnant or think you are pregnant • Sexually transmitted diseases are not treated at the clinic.  ° ° °Dental Care: °Organization         Address  Phone  Notes  °[place] [facility] [address], [place] (336) 641-6152 Accepts children up to age 21 who are enrolled in Medicaid or [place] Health Choice; pregnant women with a Medicaid card; and children who have applied for Medicaid or [place] Health Choice, but were declined, whose parents can pay a reduced fee at time of service.  °[place]  [facility] [place]  [address], [place] (336) 641-7733 Accepts children up to age 21 who are enrolled in Medicaid or [place] Health Choice; pregnant women with a Medicaid card; and children who have applied for Medicaid or  Health Choice, but were declined, whose parents can pay a reduced fee at time of service.  °[facility] ° [address], [place] (336) 641-4533 Patients are seen by appointment only. Walk-ins are not accepted. [facility] will see patients 18 years of age and older. °Monday - Tuesday (8am-5pm) °Most Wednesdays (8:30-5pm) °$30 per visit, cash only  °[facility] ° [address], [place] (336) 641-4533 Patients are seen by appointment only. Walk-ins are not accepted. [facility] will see patients 18 years of age and older. °One   Wednesday Evening (Monthly: Volunteer Based).  $30 per visit, cash only  °[facility]  (919) 537-3737 for adults; Children under age 4, call Graduate Pediatric Dentistry at ([phone]) [phone]. Children aged 4-14, please call (919) [phone] to request a pediatric application. ° Dental services are provided in all areas of dental care including fillings, crowns and bridges, complete and partial dentures, implants, gum treatment, root canals, and extractions. Preventive care is also provided. Treatment is provided to both adults and children. °Patients are selected via a lottery and there is often a waiting list. °  °[facility] [address], °[place] ° (336) 763-8833 [email or web address] °  °[facility] [address], [place], [place] (336)723-1848, Ext. 123 Second and Fourth Thursday of each month, opens at 6:30 AM; Clinic ends at 9 AM.  Patients are seen on a first-come first-served basis, and a limited number are seen during each clinic.  ° °[facility] ° [address], [place], [place] (336) 723-7904    Eligibility Requirements °You must have lived in [place], [place], or [place] counties for at least the last three months. °  You cannot be eligible for state or federal sponsored healthcare insurance, including Veterans Administration, Medicaid, or Medicare. °  You generally cannot be eligible for healthcare insurance through your employer.  °  How to apply: °Eligibility screenings are held every Tuesday and Wednesday afternoon from 1:00 pm until 4:00 pm. You do not need an appointment for the interview!  °[facility] [address], [place], [place] 336-631-2330   °[facility]  336-342-8273   °[facility]  336-703-3100   °[facility]  336-570-6415   ° °Behavioral Health Resources in the Community: °Intensive Outpatient Programs °Organization         Address  Phone  Notes  °[facility] [address], [place], [place] 336-878-6098   °[facility] [address], [place], [place] 336-832-9800   °[facility] [address], [place], [place] ° 336-882-2125   °[facility] [address],  °[place], [place] 1-800-853-5163 or 336-641-4981   °Substance Abuse Resources °Organization         Address  Phone  Notes  °[facility]  336-882-2125   °[facility]  336-784-9470   °[facility]  336-285-9073   °[facility]  336-845-3988   °Residential & Outpatient Substance Abuse Program  1-800-659-3381   °Psychological Services °Organization         Address  Phone  Notes  °[facility]  336- 832-9600   °[facility]  336- 378-7881   °[facility] [address], [place] 1-800-853-5163 or 336-641-4981   ° °Mobile Crisis Teams °Organization         Address  Phone  Notes  °[facility], Mobile Crisis Care Unit  1-877-626-1772   °Assertive °[facility] ° [address]  [place], [place] 336-834-9664   °Sharon DeEsch [address] °[place] 336-554-5454   ° °Self-Help/Support Groups °Organization         Address  Phone             Notes  °[facility]  - variety of support groups  336- 373-1402 Call for more information  °Narcotics Anonymous (NA), [facility] [address], °[place]  2 meetings at this location  ° °  Residential Treatment Programs °Organization         Address  Phone  Notes  °[facility] [address],    °[place]  1-866-801-8205   °[facility] ° [address], [place], [place] 704-293-8524   °[facility] [address], [place] 336-845-3988 Admissions: 8am-3pm M-F  °[facility] [address]    °[place], [place] 336-841-1104   °[facility] [address], [place], [place] 336-379-7146   °[facility] [address]  °[place], [place] 336-285-9073   °[facility] Intensive Outpatient [address], [place], [place] 336-852-3033   °[facility] [address]  °[place], [place] 1-877-615-2722 or 336-784-9470   °[facility] [address] [place], [place] 336-227-7417 Accepts Medicaid  °[facility] [address]  °[place] 1-800-659-3381 Substance Abuse/Addiction Treatment  ° °[place] Behavioral Health Resources °Organization         Address  Phone  Notes  °[facility]  (888) 581-9988   °Julie Brannon, PhD [address] [place], [place]   (336) 349-5553 or (336) 951-0000   °[facility]   [address] °[place], [place] (336) 349-4454   °[facility] [address], [place], [place] (336) 342-8316 Insurance/Medicaid/sponsorship through [facility]  °[facility] [address]                                    [place], [place] (336) 342-8316 Therapy/tele-psych/case    °[facility] [address]  ° [place], [place] (336) 349-2233    °Dr. Arfeen  (336) 349-4544   °[facility] [place]  [employer] [place] [facility] 1) [address], [place] °2) [address], [place] °3)  [address], [place] (336) 349-3220 °(336) 342-7768 ° °(336) 342-8140   °[place] [facility] (336) 342-1394 or (336) 342-3537 (After Hours)    ° ° °

## 2014-03-29 NOTE — ED Provider Notes (Signed)
Medical screening examination/treatment/procedure(s) were conducted as a shared visit with non-physician practitioner(s) and myself.  I personally evaluated the patient during the encounter.   EKG Interpretation None      I interviewed and examined the patient. Lungs are CTAB. Cardiac exam wnl. Abdomen soft.  Pt unusual on exam, but denies SI/HI. A/o x3. Appears comfortable. No psychiatric emergency identified on exam. Will rec f/u at [facility].   Pamella Pert, MD 03/29/14 2014

## 2014-04-06 ENCOUNTER — Emergency Department (HOSPITAL_COMMUNITY)
Admission: EM | Admit: 2014-04-06 | Discharge: 2014-04-07 | Disposition: A | Discharge status: Home / Self Care | Payer: Medicare Other | Attending: Emergency Medicine | Admitting: Emergency Medicine

## 2014-04-06 ENCOUNTER — Encounter (HOSPITAL_COMMUNITY): Payer: Self-pay | Admitting: Emergency Medicine

## 2014-04-06 DIAGNOSIS — R011 Cardiac murmur, unspecified: Secondary | ICD-10-CM | POA: Diagnosis not present

## 2014-04-06 DIAGNOSIS — Z85528 Personal history of other malignant neoplasm of kidney: Secondary | ICD-10-CM | POA: Insufficient documentation

## 2014-04-06 DIAGNOSIS — Z0000 Encounter for general adult medical examination without abnormal findings: Secondary | ICD-10-CM

## 2014-04-06 DIAGNOSIS — Z859 Personal history of malignant neoplasm, unspecified: Secondary | ICD-10-CM | POA: Diagnosis not present

## 2014-04-06 DIAGNOSIS — F319 Bipolar disorder, unspecified: Secondary | ICD-10-CM | POA: Insufficient documentation

## 2014-04-06 DIAGNOSIS — R69 Illness, unspecified: Secondary | ICD-10-CM | POA: Diagnosis present

## 2014-04-06 DIAGNOSIS — I10 Essential (primary) hypertension: Secondary | ICD-10-CM | POA: Diagnosis not present

## 2014-04-06 DIAGNOSIS — Z87440 Personal history of urinary (tract) infections: Secondary | ICD-10-CM | POA: Insufficient documentation

## 2014-04-06 DIAGNOSIS — Z9889 Other specified postprocedural states: Secondary | ICD-10-CM | POA: Insufficient documentation

## 2014-04-06 DIAGNOSIS — J45909 Unspecified asthma, uncomplicated: Secondary | ICD-10-CM | POA: Diagnosis not present

## 2014-04-06 DIAGNOSIS — Z905 Acquired absence of kidney: Secondary | ICD-10-CM | POA: Diagnosis not present

## 2014-04-06 DIAGNOSIS — Z8739 Personal history of other diseases of the musculoskeletal system and connective tissue: Secondary | ICD-10-CM | POA: Insufficient documentation

## 2014-04-06 DIAGNOSIS — F310 Bipolar disorder, current episode hypomanic: Secondary | ICD-10-CM

## 2014-04-06 DIAGNOSIS — E669 Obesity, unspecified: Secondary | ICD-10-CM | POA: Diagnosis not present

## 2014-04-06 DIAGNOSIS — Z7951 Long term (current) use of inhaled steroids: Secondary | ICD-10-CM | POA: Insufficient documentation

## 2014-04-06 DIAGNOSIS — D649 Anemia, unspecified: Secondary | ICD-10-CM | POA: Diagnosis not present

## 2014-04-06 DIAGNOSIS — F209 Schizophrenia, unspecified: Secondary | ICD-10-CM | POA: Diagnosis not present

## 2014-04-06 DIAGNOSIS — Z79899 Other long term (current) drug therapy: Secondary | ICD-10-CM | POA: Insufficient documentation

## 2014-04-06 NOTE — ED Notes (Signed)
Pt was released from jail this evening and needs all her medications and she states that she wants inpatient psychiatric care, pt is not suicidal. She wants to be in the hospital sfter she gets all her stuff in order

## 2014-04-07 LAB — I-STAT CHEM 8, ED
BUN: 12 mg/dL (ref 6–23)
CREATININE: 1 mg/dL (ref 0.50–1.10)
Calcium, Ion: 1.25 mmol/L (ref 1.13–1.30)
Chloride: 109 mEq/L (ref 96–112)
GLUCOSE: 90 mg/dL (ref 70–99)
HCT: 37 % (ref 36.0–46.0)
HEMOGLOBIN: 12.6 g/dL (ref 12.0–15.0)
POTASSIUM: 3.4 meq/L — AB (ref 3.7–5.3)
Sodium: 143 mEq/L (ref 137–147)
TCO2: 23 mmol/L (ref 0–100)

## 2014-04-07 NOTE — Discharge Instructions (Signed)
Your workup today showed no signs of lung problems.  Your kidney function was normal.   Preventive Care for Adults A healthy lifestyle and preventive care can promote health and wellness. Preventive health guidelines for women include the following key practices.  A routine yearly physical is a good way to check with your health care provider about your health and preventive screening. It is a chance to share any concerns and updates on your health and to receive a thorough exam.  Visit your dentist for a routine exam and preventive care every 6 months. Brush your teeth twice a day and floss once a day. Good oral hygiene prevents tooth decay and gum disease.  The frequency of eye exams is based on your age, health, family medical history, use of contact lenses, and other factors. Follow your health care provider's recommendations for frequency of eye exams.  Eat a healthy diet. Foods like vegetables, fruits, whole grains, low-fat dairy products, and lean protein foods contain the nutrients you need without too many calories. Decrease your intake of foods high in solid fats, added sugars, and salt. Eat the right amount of calories for you.Get information about a proper diet from your health care provider, if necessary.  Regular physical exercise is one of the most important things you can do for your health. Most adults should get at least 150 minutes of moderate-intensity exercise (any activity that increases your heart rate and causes you to sweat) each week. In addition, most adults need muscle-strengthening exercises on 2 or more days a week.  Maintain a healthy weight. The body mass index (BMI) is a screening tool to identify possible weight problems. It provides an estimate of body fat based on height and weight. Your health care provider can find your BMI and can help you achieve or maintain a healthy weight.For adults 20 years and older:  A BMI below 18.5 is considered underweight.  A  BMI of 18.5 to 24.9 is normal.  A BMI of 25 to 29.9 is considered overweight.  A BMI of 30 and above is considered obese.  Maintain normal blood lipids and cholesterol levels by exercising and minimizing your intake of saturated fat. Eat a balanced diet with plenty of fruit and vegetables. Blood tests for lipids and cholesterol should begin at age 5 and be repeated every 5 years. If your lipid or cholesterol levels are high, you are over 50, or you are at high risk for heart disease, you may need your cholesterol levels checked more frequently.Ongoing high lipid and cholesterol levels should be treated with medicines if diet and exercise are not working.  If you smoke, find out from your health care provider how to quit. If you do not use tobacco, do not start.  Lung cancer screening is recommended for adults aged 31-80 years who are at high risk for developing lung cancer because of a history of smoking. A yearly low-dose CT scan of the lungs is recommended for people who have at least a 30-pack-year history of smoking and are a current smoker or have quit within the past 15 years. A pack year of smoking is smoking an average of 1 pack of cigarettes a day for 1 year (for example: 1 pack a day for 30 years or 2 packs a day for 15 years). Yearly screening should continue until the smoker has stopped smoking for at least 15 years. Yearly screening should be stopped for people who develop a health problem that would prevent them from  having lung cancer treatment.  If you are pregnant, do not drink alcohol. If you are breastfeeding, be very cautious about drinking alcohol. If you are not pregnant and choose to drink alcohol, do not have more than 1 drink per day. One drink is considered to be 12 ounces (355 mL) of beer, 5 ounces (148 mL) of wine, or 1.5 ounces (44 mL) of liquor.  Avoid use of street drugs. Do not share needles with anyone. Ask for help if you need support or instructions about stopping  the use of drugs.  High blood pressure causes heart disease and increases the risk of stroke. Your blood pressure should be checked at least every 1 to 2 years. Ongoing high blood pressure should be treated with medicines if weight loss and exercise do not work.  If you are 11-41 years old, ask your health care provider if you should take aspirin to prevent strokes.  Diabetes screening involves taking a blood sample to check your fasting blood sugar level. This should be done once every 3 years, after age 61, if you are within normal weight and without risk factors for diabetes. Testing should be considered at a younger age or be carried out more frequently if you are overweight and have at least 1 risk factor for diabetes.  Breast cancer screening is essential preventive care for women. You should practice "breast self-awareness." This means understanding the normal appearance and feel of your breasts and may include breast self-examination. Any changes detected, no matter how small, should be reported to a health care provider. Women in their 67s and 30s should have a clinical breast exam (CBE) by a health care provider as part of a regular health exam every 1 to 3 years. After age 21, women should have a CBE every year. Starting at age 79, women should consider having a mammogram (breast X-ray test) every year. Women who have a family history of breast cancer should talk to their health care provider about genetic screening. Women at a high risk of breast cancer should talk to their health care providers about having an MRI and a mammogram every year.  Breast cancer gene (BRCA)-related cancer risk assessment is recommended for women who have family members with BRCA-related cancers. BRCA-related cancers include breast, ovarian, tubal, and peritoneal cancers. Having family members with these cancers may be associated with an increased risk for harmful changes (mutations) in the breast cancer genes BRCA1  and BRCA2. Results of the assessment will determine the need for genetic counseling and BRCA1 and BRCA2 testing.  Routine pelvic exams to screen for cancer are no longer recommended for nonpregnant women who are considered low risk for cancer of the pelvic organs (ovaries, uterus, and vagina) and who do not have symptoms. Ask your health care provider if a screening pelvic exam is right for you.  If you have had past treatment for cervical cancer or a condition that could lead to cancer, you need Pap tests and screening for cancer for at least 20 years after your treatment. If Pap tests have been discontinued, your risk factors (such as having a new sexual partner) need to be reassessed to determine if screening should be resumed. Some women have medical problems that increase the chance of getting cervical cancer. In these cases, your health care provider may recommend more frequent screening and Pap tests.  The HPV test is an additional test that may be used for cervical cancer screening. The HPV test looks for the virus that  can cause the cell changes on the cervix. The cells collected during the Pap test can be tested for HPV. The HPV test could be used to screen women aged 37 years and older, and should be used in women of any age who have unclear Pap test results. After the age of 44, women should have HPV testing at the same frequency as a Pap test.  Colorectal cancer can be detected and often prevented. Most routine colorectal cancer screening begins at the age of 17 years and continues through age 69 years. However, your health care provider may recommend screening at an earlier age if you have risk factors for colon cancer. On a yearly basis, your health care provider may provide home test kits to check for hidden blood in the stool. Use of a small camera at the end of a tube, to directly examine the colon (sigmoidoscopy or colonoscopy), can detect the earliest forms of colorectal cancer. Talk to  your health care provider about this at age 68, when routine screening begins. Direct exam of the colon should be repeated every 5-10 years through age 50 years, unless early forms of pre-cancerous polyps or small growths are found.  People who are at an increased risk for hepatitis B should be screened for this virus. You are considered at high risk for hepatitis B if:  You were born in a country where hepatitis B occurs often. Talk with your health care provider about which countries are considered high risk.  Your parents were born in a high-risk country and you have not received a shot to protect against hepatitis B (hepatitis B vaccine).  You have HIV or AIDS.  You use needles to inject street drugs.  You live with, or have sex with, someone who has hepatitis B.  You get hemodialysis treatment.  You take certain medicines for conditions like cancer, organ transplantation, and autoimmune conditions.  Hepatitis C blood testing is recommended for all people born from [birth year] through 1965 and any individual with known risks for hepatitis C.  Practice safe sex. Use condoms and avoid high-risk sexual practices to reduce the spread of sexually transmitted infections (STIs). STIs include gonorrhea, chlamydia, syphilis, trichomonas, herpes, HPV, and human immunodeficiency virus (HIV). Herpes, HIV, and HPV are viral illnesses that have no cure. They can result in disability, cancer, and death.  You should be screened for sexually transmitted illnesses (STIs) including gonorrhea and chlamydia if:  You are sexually active and are younger than 24 years.  You are older than 24 years and your health care provider tells you that you are at risk for this type of infection.  Your sexual activity has changed since you were last screened and you are at an increased risk for chlamydia or gonorrhea. Ask your health care provider if you are at risk.  If you are at risk of being infected with HIV, it is  recommended that you take a prescription medicine daily to prevent HIV infection. This is called preexposure prophylaxis (PrEP). You are considered at risk if:  You are a heterosexual woman, are sexually active, and are at increased risk for HIV infection.  You take drugs by injection.  You are sexually active with a partner who has HIV.  Talk with your health care provider about whether you are at high risk of being infected with HIV. If you choose to begin PrEP, you should first be tested for HIV. You should then be tested every 3 months for as long as  you are taking PrEP.  Osteoporosis is a disease in which the bones lose minerals and strength with aging. This can result in serious bone fractures or breaks. The risk of osteoporosis can be identified using a bone density scan. Women ages 35 years and over and women at risk for fractures or osteoporosis should discuss screening with their health care providers. Ask your health care provider whether you should take a calcium supplement or vitamin D to reduce the rate of osteoporosis.  Menopause can be associated with physical symptoms and risks. Hormone replacement therapy is available to decrease symptoms and risks. You should talk to your health care provider about whether hormone replacement therapy is right for you.  Use sunscreen. Apply sunscreen liberally and repeatedly throughout the day. You should seek shade when your shadow is shorter than you. Protect yourself by wearing long sleeves, pants, a wide-brimmed hat, and sunglasses year round, whenever you are outdoors.  Once a month, do a whole body skin exam, using a mirror to look at the skin on your back. Tell your health care provider of new moles, moles that have irregular borders, moles that are larger than a pencil eraser, or moles that have changed in shape or color.  Stay current with required vaccines (immunizations).  Influenza vaccine. All adults should be immunized every  year.  Tetanus, diphtheria, and acellular pertussis (Td, Tdap) vaccine. Pregnant women should receive 1 dose of Tdap vaccine during each pregnancy. The dose should be obtained regardless of the length of time since the last dose. Immunization is preferred during the 27th-36th week of gestation. An adult who has not previously received Tdap or who does not know her vaccine status should receive 1 dose of Tdap. This initial dose should be followed by tetanus and diphtheria toxoids (Td) booster doses every 10 years. Adults with an unknown or incomplete history of completing a 3-dose immunization series with Td-containing vaccines should begin or complete a primary immunization series including a Tdap dose. Adults should receive a Td booster every 10 years.  Varicella vaccine. An adult without evidence of immunity to varicella should receive 2 doses or a second dose if she has previously received 1 dose. Pregnant females who do not have evidence of immunity should receive the first dose after pregnancy. This first dose should be obtained before leaving the health care facility. The second dose should be obtained 4-8 weeks after the first dose.  Human papillomavirus (HPV) vaccine. Females aged 13-26 years who have not received the vaccine previously should obtain the 3-dose series. The vaccine is not recommended for use in pregnant females. However, pregnancy testing is not needed before receiving a dose. If a female is found to be pregnant after receiving a dose, no treatment is needed. In that case, the remaining doses should be delayed until after the pregnancy. Immunization is recommended for any person with an immunocompromised condition through the age of 48 years if she did not get any or all doses earlier. During the 3-dose series, the second dose should be obtained 4-8 weeks after the first dose. The third dose should be obtained 24 weeks after the first dose and 16 weeks after the second dose.  Zoster  vaccine. One dose is recommended for adults aged 21 years or older unless certain conditions are present.  Measles, mumps, and rubella (MMR) vaccine. Adults born before [birth year] generally are considered immune to measles and mumps. Adults born in [birth year] or later should have 1 or more doses of MMR  vaccine unless there is a contraindication to the vaccine or there is laboratory evidence of immunity to each of the three diseases. A routine second dose of MMR vaccine should be obtained at least 28 days after the first dose for students attending postsecondary schools, health care workers, or international travelers. People who received inactivated measles vaccine or an unknown type of measles vaccine during 1963-1967 should receive 2 doses of MMR vaccine. People who received inactivated mumps vaccine or an unknown type of mumps vaccine before 1979 and are at high risk for mumps infection should consider immunization with 2 doses of MMR vaccine. For females of childbearing age, rubella immunity should be determined. If there is no evidence of immunity, females who are not pregnant should be vaccinated. If there is no evidence of immunity, females who are pregnant should delay immunization until after pregnancy. Unvaccinated health care workers born before [birth year] who lack laboratory evidence of measles, mumps, or rubella immunity or laboratory confirmation of disease should consider measles and mumps immunization with 2 doses of MMR vaccine or rubella immunization with 1 dose of MMR vaccine.  Pneumococcal 13-valent conjugate (PCV13) vaccine. When indicated, a person who is uncertain of her immunization history and has no record of immunization should receive the PCV13 vaccine. An adult aged 45 years or older who has certain medical conditions and has not been previously immunized should receive 1 dose of PCV13 vaccine. This PCV13 should be followed with a dose of pneumococcal polysaccharide (PPSV23) vaccine. The PPSV23  vaccine dose should be obtained at least 8 weeks after the dose of PCV13 vaccine. An adult aged 80 years or older who has certain medical conditions and previously received 1 or more doses of PPSV23 vaccine should receive 1 dose of PCV13. The PCV13 vaccine dose should be obtained 1 or more years after the last PPSV23 vaccine dose.  Pneumococcal polysaccharide (PPSV23) vaccine. When PCV13 is also indicated, PCV13 should be obtained first. All adults aged 79 years and older should be immunized. An adult younger than age 23 years who has certain medical conditions should be immunized. Any person who resides in a nursing home or long-term care facility should be immunized. An adult smoker should be immunized. People with an immunocompromised condition and certain other conditions should receive both PCV13 and PPSV23 vaccines. People with human immunodeficiency virus (HIV) infection should be immunized as soon as possible after diagnosis. Immunization during chemotherapy or radiation therapy should be avoided. Routine use of PPSV23 vaccine is not recommended for American Indians, [place] Natives, or people younger than 65 years unless there are medical conditions that require PPSV23 vaccine. When indicated, people who have unknown immunization and have no record of immunization should receive PPSV23 vaccine. One-time revaccination 5 years after the first dose of PPSV23 is recommended for people aged 19-64 years who have chronic kidney failure, nephrotic syndrome, asplenia, or immunocompromised conditions. People who received 1-2 doses of PPSV23 before age 2 years should receive another dose of PPSV23 vaccine at age 23 years or later if at least 5 years have passed since the previous dose. Doses of PPSV23 are not needed for people immunized with PPSV23 at or after age 85 years.  Meningococcal vaccine. Adults with asplenia or persistent complement component deficiencies should receive 2 doses of quadrivalent  meningococcal conjugate (MenACWY-D) vaccine. The doses should be obtained at least 2 months apart. Microbiologists working with certain meningococcal bacteria, [employer] recruits, people at risk during an outbreak, and people who travel to or live in  countries with a high rate of meningitis should be immunized. A first-year college student up through age 2 years who is living in a residence hall should receive a dose if she did not receive a dose on or after her 16th birthday. Adults who have certain high-risk conditions should receive one or more doses of vaccine.  Hepatitis A vaccine. Adults who wish to be protected from this disease, have certain high-risk conditions, work with hepatitis A-infected animals, work in hepatitis A research labs, or travel to or work in countries with a high rate of hepatitis A should be immunized. Adults who were previously unvaccinated and who anticipate close contact with an international adoptee during the first 60 days after arrival in the [place] from a country with a high rate of hepatitis A should be immunized.  Hepatitis B vaccine. Adults who wish to be protected from this disease, have certain high-risk conditions, may be exposed to blood or other infectious body fluids, are household contacts or sex partners of hepatitis B positive people, are clients or workers in certain care facilities, or travel to or work in countries with a high rate of hepatitis B should be immunized.  Haemophilus influenzae type b (Hib) vaccine. A previously unvaccinated person with asplenia or sickle cell disease or having a scheduled splenectomy should receive 1 dose of Hib vaccine. Regardless of previous immunization, a recipient of a hematopoietic stem cell transplant should receive a 3-dose series 6-12 months after her successful transplant. Hib vaccine is not recommended for adults with HIV infection. Preventive Services / Frequency Ages 4 to 13 years  Blood pressure check.**  / Every 1 to 2 years.  Lipid and cholesterol check.** / Every 5 years beginning at age 57.  Clinical breast exam.** / Every 3 years for women in their 3s and 84s.  BRCA-related cancer risk assessment.** / For women who have family members with a BRCA-related cancer (breast, ovarian, tubal, or peritoneal cancers).  Pap test.** / Every 2 years from ages 38 through 55. Every 3 years starting at age 8 through age 63 or 12 with a history of 3 consecutive normal Pap tests.  HPV screening.** / Every 3 years from ages 43 through ages 77 to 80 with a history of 3 consecutive normal Pap tests.  Hepatitis C blood test.** / For any individual with known risks for hepatitis C.  Skin self-exam. / Monthly.  Influenza vaccine. / Every year.  Tetanus, diphtheria, and acellular pertussis (Tdap, Td) vaccine.** / Consult your health care provider. Pregnant women should receive 1 dose of Tdap vaccine during each pregnancy. 1 dose of Td every 10 years.  Varicella vaccine.** / Consult your health care provider. Pregnant females who do not have evidence of immunity should receive the first dose after pregnancy.  HPV vaccine. / 3 doses over 6 months, if 74 and younger. The vaccine is not recommended for use in pregnant females. However, pregnancy testing is not needed before receiving a dose.  Measles, mumps, rubella (MMR) vaccine.** / You need at least 1 dose of MMR if you were born in 1957 or later. You may also need a 2nd dose. For females of childbearing age, rubella immunity should be determined. If there is no evidence of immunity, females who are not pregnant should be vaccinated. If there is no evidence of immunity, females who are pregnant should delay immunization until after pregnancy.  Pneumococcal 13-valent conjugate (PCV13) vaccine.** / Consult your health care provider.  Pneumococcal polysaccharide (PPSV23) vaccine.** /  1 to 2 doses if you smoke cigarettes or if you have certain  conditions.  Meningococcal vaccine.** / 1 dose if you are age 75 to 72 years and a Market researcher living in a residence hall, or have one of several medical conditions, you need to get vaccinated against meningococcal disease. You may also need additional booster doses.  Hepatitis A vaccine.** / Consult your health care provider.  Hepatitis B vaccine.** / Consult your health care provider.  Haemophilus influenzae type b (Hib) vaccine.** / Consult your health care provider. Ages 48 to 58 years  Blood pressure check.** / Every 1 to 2 years.  Lipid and cholesterol check.** / Every 5 years beginning at age 55 years.  Lung cancer screening. / Every year if you are aged 61-80 years and have a 30-pack-year history of smoking and currently smoke or have quit within the past 15 years. Yearly screening is stopped once you have quit smoking for at least 15 years or develop a health problem that would prevent you from having lung cancer treatment.  Clinical breast exam.** / Every year after age 13 years.  BRCA-related cancer risk assessment.** / For women who have family members with a BRCA-related cancer (breast, ovarian, tubal, or peritoneal cancers).  Mammogram.** / Every year beginning at age 29 years and continuing for as long as you are in good health. Consult with your health care provider.  Pap test.** / Every 3 years starting at age 52 years through age 32 or 15 years with a history of 3 consecutive normal Pap tests.  HPV screening.** / Every 3 years from ages 77 years through ages 68 to 102 years with a history of 3 consecutive normal Pap tests.  Fecal occult blood test (FOBT) of stool. / Every year beginning at age 14 years and continuing until age 67 years. You may not need to do this test if you get a colonoscopy every 10 years.  Flexible sigmoidoscopy or colonoscopy.** / Every 5 years for a flexible sigmoidoscopy or every 10 years for a colonoscopy beginning at age 23 years  and continuing until age 75 years.  Hepatitis C blood test.** / For all people born from [birth year] through 1965 and any individual with known risks for hepatitis C.  Skin self-exam. / Monthly.  Influenza vaccine. / Every year.  Tetanus, diphtheria, and acellular pertussis (Tdap/Td) vaccine.** / Consult your health care provider. Pregnant women should receive 1 dose of Tdap vaccine during each pregnancy. 1 dose of Td every 10 years.  Varicella vaccine.** / Consult your health care provider. Pregnant females who do not have evidence of immunity should receive the first dose after pregnancy.  Zoster vaccine.** / 1 dose for adults aged 70 years or older.  Measles, mumps, rubella (MMR) vaccine.** / You need at least 1 dose of MMR if you were born in 1957 or later. You may also need a 2nd dose. For females of childbearing age, rubella immunity should be determined. If there is no evidence of immunity, females who are not pregnant should be vaccinated. If there is no evidence of immunity, females who are pregnant should delay immunization until after pregnancy.  Pneumococcal 13-valent conjugate (PCV13) vaccine.** / Consult your health care provider.  Pneumococcal polysaccharide (PPSV23) vaccine.** / 1 to 2 doses if you smoke cigarettes or if you have certain conditions.  Meningococcal vaccine.** / Consult your health care provider.  Hepatitis A vaccine.** / Consult your health care provider.  Hepatitis B vaccine.** / Consult your  health care provider.  Haemophilus influenzae type b (Hib) vaccine.** / Consult your health care provider. Ages 77 years and over  Blood pressure check.** / Every 1 to 2 years.  Lipid and cholesterol check.** / Every 5 years beginning at age 41 years.  Lung cancer screening. / Every year if you are aged 31-80 years and have a 30-pack-year history of smoking and currently smoke or have quit within the past 15 years. Yearly screening is stopped once you have quit smoking  for at least 15 years or develop a health problem that would prevent you from having lung cancer treatment.  Clinical breast exam.** / Every year after age 33 years.  BRCA-related cancer risk assessment.** / For women who have family members with a BRCA-related cancer (breast, ovarian, tubal, or peritoneal cancers).  Mammogram.** / Every year beginning at age 88 years and continuing for as long as you are in good health. Consult with your health care provider.  Pap test.** / Every 3 years starting at age 4 years through age 70 or 53 years with 3 consecutive normal Pap tests. Testing can be stopped between 65 and 70 years with 3 consecutive normal Pap tests and no abnormal Pap or HPV tests in the past 10 years.  HPV screening.** / Every 3 years from ages 45 years through ages 26 or 43 years with a history of 3 consecutive normal Pap tests. Testing can be stopped between 65 and 70 years with 3 consecutive normal Pap tests and no abnormal Pap or HPV tests in the past 10 years.  Fecal occult blood test (FOBT) of stool. / Every year beginning at age 65 years and continuing until age 44 years. You may not need to do this test if you get a colonoscopy every 10 years.  Flexible sigmoidoscopy or colonoscopy.** / Every 5 years for a flexible sigmoidoscopy or every 10 years for a colonoscopy beginning at age 45 years and continuing until age 46 years.  Hepatitis C blood test.** / For all people born from [birth year] through 1965 and any individual with known risks for hepatitis C.  Osteoporosis screening.** / A one-time screening for women ages 24 years and over and women at risk for fractures or osteoporosis.  Skin self-exam. / Monthly.  Influenza vaccine. / Every year.  Tetanus, diphtheria, and acellular pertussis (Tdap/Td) vaccine.** / 1 dose of Td every 10 years.  Varicella vaccine.** / Consult your health care provider.  Zoster vaccine.** / 1 dose for adults aged 9 years or older.  Pneumococcal  13-valent conjugate (PCV13) vaccine.** / Consult your health care provider.  Pneumococcal polysaccharide (PPSV23) vaccine.** / 1 dose for all adults aged 27 years and older.  Meningococcal vaccine.** / Consult your health care provider.  Hepatitis A vaccine.** / Consult your health care provider.  Hepatitis B vaccine.** / Consult your health care provider.  Haemophilus influenzae type b (Hib) vaccine.** / Consult your health care provider. ** Family history and personal history of risk and conditions may change your health care provider's recommendations. Document Released: 07/31/2001 Document Revised: 10/19/2013 Document Reviewed: 10/30/2010 [facility] Patient Information 2015 [place], [place]. This information is not intended to replace advice given to you by your health care provider. Make sure you discuss any questions you have with your health care provider.

## 2014-04-07 NOTE — ED Provider Notes (Signed)
CSN: [id number]     Arrival date & time 04/06/14  2123 History   First MD Initiated Contact with Patient 04/07/14 0020     Chief Complaint  Patient presents with  . Illness     (Consider location/radiation/quality/duration/timing/severity/associated sxs/prior Treatment) HPI 68 year old female presents to emergency department wishing a check up.  Patient reports years released from jail earlier today, and has been without her medications.  She reports that she only has one kidney and is worried about kidney failure.  She also has been without her inhalers and is worried about wheezing.  She denies any cough or wheeze at this time and no shortness of breath.  Patient has frequent visits to the emergency department, has underlying mental health issues that she is not a dressing.  She denies any current SI or HI.  She reports that she has access to all of her medications, and plans to follow up with her primary care doctor in the morning.   Past Medical History  Diagnosis Date  . Chest pain   . Cardiomyopathy   . HTN (hypertension)   . Takotsubo cardiomyopathy   . HLD (hyperlipidemia)   . Asthma   . Fibromyalgia   . DJD (degenerative joint disease)   . Chronic anemia   . Obesity   . Carotid bruit     left  . Psychosis   . Lumbar herniated disc   . UTI (lower urinary tract infection)   . Schizophrenia   . Heart murmur   . Single kidney   . History of kidney cancer     unknown type. had R kidney removed b/c of CA.   Marland Kitchen Kidney stones   . GERD (gastroesophageal reflux disease)     on rare occas. - uses peptobismol  . Bipolar 1 disorder   . Cancer     renal cell carcinoma   Past Surgical History  Procedure Laterality Date  . Nephrectomy  1990's    right, secondary to cyst  . Breast lumpectomy  90's    left, cyst  . Cystectomy  90's    back (left)  . Total knee arthroplasty  2005    right  . Cesarean section  1980    x1  . Cardiac catheterization  2010    no intervention   . Total knee arthroplasty Left 03/25/2013    Procedure: TOTAL KNEE ARTHROPLASTY;  Surgeon: Alta Corning, MD;  Location: [place];  Service: Orthopedics;  Laterality: Left;   Family History  Problem Relation Age of Onset  . Coronary artery disease Mother 58  . Heart failure Mother     congestive  . Stroke Mother   . Brain cancer Father 54  . Hypertension      siblings  . Aortic stenosis      sibling   History  Substance Use Topics  . Smoking status: Never Smoker   . Smokeless tobacco: Not on file  . Alcohol Use: No     Comment: denies   OB History   Grav Para Term Preterm Abortions TAB SAB Ect Mult Living                 Review of Systems  Unable to perform ROS: Psychiatric disorder      Allergies  Bee venom; Other; Avelox; and Nsaids  Home Medications   Prior to Admission medications   Medication Sig Start Date End Date Taking? Authorizing Provider  albuterol (PROVENTIL HFA;VENTOLIN HFA) 108 (90 BASE) MCG/ACT inhaler  Inhale 2 puffs into the lungs 5 (five) times daily as needed for wheezing or shortness of breath.   Yes Historical Provider, MD  albuterol (PROVENTIL) (2.5 MG/3ML) 0.083% nebulizer solution Take 2.5 mg by nebulization every 4 (four) hours as needed for wheezing or shortness of breath.   Yes Historical Provider, MD  amLODipine (NORVASC) 5 MG tablet Take 5 mg by mouth daily.   Yes Historical Provider, MD  carvedilol (COREG) 25 MG tablet Take 25 mg by mouth 2 (two) times daily with a meal.   Yes Historical Provider, MD  EPINEPHrine (EPIPEN) 0.3 mg/0.3 mL IJ SOAJ injection Inject 0.3 mg into the muscle as needed (allergic reaction).   Yes Historical Provider, MD  folic acid (FOLVITE) 1 MG tablet Take 1 mg by mouth daily.   Yes Historical Provider, MD  furosemide (LASIX) 40 MG tablet Take 40 mg by mouth daily as needed (for fluid retention.).   Yes Historical Provider, MD  levocetirizine (XYZAL) 5 MG tablet Take 5 mg by mouth every evening.   Yes Historical  Provider, MD  mirabegron ER (MYRBETRIQ) 25 MG TB24 tablet Take 25 mg by mouth daily as needed (for urinary frequency with lasix).   Yes Historical Provider, MD  mometasone-formoterol (DULERA) 100-5 MCG/ACT AERO Inhale 2 puffs into the lungs 2 (two) times daily.   Yes Historical Provider, MD  ondansetron (ZOFRAN-ODT) 4 MG disintegrating tablet Take 4 mg by mouth every 6 (six) hours as needed for nausea or vomiting.   Yes Historical Provider, MD  OVER THE COUNTER MEDICATION Take 4 tablets by mouth daily. Papaya supplement for nausea   Yes Historical Provider, MD  polyethylene glycol (MIRALAX / GLYCOLAX) packet Take 17 g by mouth daily as needed (constipation). Mix in 8 oz liquid and drink   Yes Historical Provider, MD  potassium chloride SA (K-DUR,KLOR-CON) 20 MEQ tablet Take 20 mEq by mouth daily as needed (takes with lasix for fluid retention).   Yes Historical Provider, MD  QUEtiapine Fumarate (SEROQUEL XR) 150 MG 24 hr tablet Take 150 mg by mouth at bedtime.   Yes Historical Provider, MD  tiZANidine (ZANAFLEX) 4 MG tablet Take 4 mg by mouth every 8 (eight) hours as needed for muscle spasms.   Yes Historical Provider, MD  traMADol (ULTRAM) 50 MG tablet Take 50 mg by mouth 3 (three) times daily as needed (pain).   Yes Historical Provider, MD  VITAMIN D, CHOLECALCIFEROL, PO Take 10,000 Units by mouth daily.   Yes Historical Provider, MD   BP 181/83  Pulse 88  Temp(Src) 98.2 F (36.8 C) (Oral)  Resp 18  SpO2 100% Physical Exam  Nursing note and vitals reviewed. Constitutional: She is oriented to person, place, and time. She appears well-developed and well-nourished.  HENT:  Head: Normocephalic and atraumatic.  Right Ear: External ear normal.  Left Ear: External ear normal.  Nose: Nose normal.  Mouth/Throat: Oropharynx is clear and moist.  Eyes: Conjunctivae and EOM are normal. Pupils are equal, round, and reactive to light.  Neck: Normal range of motion. Neck supple. No JVD present. No  tracheal deviation present. No thyromegaly present.  Cardiovascular: Normal rate, regular rhythm, normal heart sounds and intact distal pulses.  Exam reveals no gallop and no friction rub.   No murmur heard. Pulmonary/Chest: Effort normal and breath sounds normal. No stridor. No respiratory distress. She has no wheezes. She has no rales. She exhibits no tenderness.  Abdominal: Soft. Bowel sounds are normal. She exhibits no distension and no mass.  There is no tenderness. There is no rebound and no guarding.  Musculoskeletal: Normal range of motion. She exhibits no edema and no tenderness.  Lymphadenopathy:    She has no cervical adenopathy.  Neurological: She is alert and oriented to person, place, and time. She displays normal reflexes. She exhibits normal muscle tone. Coordination normal.  Skin: Skin is warm and dry. No rash noted. No erythema. No pallor.  Psychiatric: She has a normal mood and affect. Her behavior is normal. Judgment and thought content normal.    ED Course  Procedures (including critical care time) Labs Review Labs Reviewed  I-STAT CHEM 8, ED - Abnormal; Notable for the following:    Potassium 3.4 (*)    All other components within normal limits    Imaging Review No results found.   EKG Interpretation None      MDM   Final diagnoses:  Normal physical exam  Bipolar affective disorder, current episode hypomanic    68 year old female released from jail earlier this evening.  Patient reported triage that she needed medications refilled, but to me she reports that she has all her medications at home.  She reports that she has access to the medications.  She reports that she is concerned about her kidney function and she only has one kidney and has history of kidney failure.  She is requesting a physical exam.  She denies any short of breath or wheezing at this time.  She denies any SI HI.  Patient has history of bipolar disorder, and has a bizarre affect, but does  not meet criteria for acute admission.  I-STAT 8 is within normal limits.  Patient instructed to followup with primary care Dr.   Kalman Drape, MD 04/08/14 [phone]

## 2014-04-07 NOTE — ED Notes (Signed)
Patient was found to be rifling through cabinets in treatment room. Patient was reminded she is not to allowed to get into cabinets and drawers. All cabinets locked at this time. Patient began cursing at staff. Patient was advised she may not speak to staff in that manner and if she con't to curse she will be escorted out.

## 2014-04-07 NOTE — ED Notes (Signed)
Patient states she is coming in for evaluation after being d/c from jail today as she had no where to go. Patient states she can not go home as the "lights have been cut off". Patient states she was without her routine medications while in jail. Patient was in jail for trespassing here at [facility]. Patient states she wants to sleep here tonight and tomorrow "Mrs. Powers, the lady who helps her will be able to come pick her up." Patient has been advised that once she is discharged she will have to leave, that she will not be able to stay in the hospital. Patient rambling and argumentative with this nurse.

## 2014-06-02 ENCOUNTER — Ambulatory Visit: Payer: Self-pay | Admitting: Family Medicine

## 2014-06-12 ENCOUNTER — Encounter (HOSPITAL_COMMUNITY): Payer: Self-pay | Admitting: Family Medicine

## 2014-06-12 ENCOUNTER — Emergency Department (HOSPITAL_COMMUNITY): Payer: Medicare Other

## 2014-06-12 ENCOUNTER — Emergency Department (HOSPITAL_COMMUNITY)
Admission: EM | Admit: 2014-06-12 | Discharge: 2014-06-12 | Disposition: A | Discharge status: Home / Self Care | Payer: Medicare Other | Attending: Emergency Medicine | Admitting: Emergency Medicine

## 2014-06-12 DIAGNOSIS — D649 Anemia, unspecified: Secondary | ICD-10-CM | POA: Insufficient documentation

## 2014-06-12 DIAGNOSIS — R51 Headache: Secondary | ICD-10-CM | POA: Insufficient documentation

## 2014-06-12 DIAGNOSIS — R0789 Other chest pain: Secondary | ICD-10-CM | POA: Diagnosis not present

## 2014-06-12 DIAGNOSIS — G8929 Other chronic pain: Secondary | ICD-10-CM

## 2014-06-12 DIAGNOSIS — G8921 Chronic pain due to trauma: Secondary | ICD-10-CM | POA: Insufficient documentation

## 2014-06-12 DIAGNOSIS — J45909 Unspecified asthma, uncomplicated: Secondary | ICD-10-CM | POA: Diagnosis not present

## 2014-06-12 DIAGNOSIS — I10 Essential (primary) hypertension: Secondary | ICD-10-CM | POA: Insufficient documentation

## 2014-06-12 DIAGNOSIS — R011 Cardiac murmur, unspecified: Secondary | ICD-10-CM | POA: Diagnosis not present

## 2014-06-12 DIAGNOSIS — F209 Schizophrenia, unspecified: Secondary | ICD-10-CM | POA: Insufficient documentation

## 2014-06-12 DIAGNOSIS — R519 Headache, unspecified: Secondary | ICD-10-CM

## 2014-06-12 DIAGNOSIS — Z85528 Personal history of other malignant neoplasm of kidney: Secondary | ICD-10-CM | POA: Diagnosis not present

## 2014-06-12 DIAGNOSIS — Z79899 Other long term (current) drug therapy: Secondary | ICD-10-CM | POA: Insufficient documentation

## 2014-06-12 DIAGNOSIS — F319 Bipolar disorder, unspecified: Secondary | ICD-10-CM | POA: Diagnosis not present

## 2014-06-12 DIAGNOSIS — E669 Obesity, unspecified: Secondary | ICD-10-CM | POA: Insufficient documentation

## 2014-06-12 DIAGNOSIS — Z9889 Other specified postprocedural states: Secondary | ICD-10-CM | POA: Diagnosis not present

## 2014-06-12 DIAGNOSIS — Z8639 Personal history of other endocrine, nutritional and metabolic disease: Secondary | ICD-10-CM | POA: Insufficient documentation

## 2014-06-12 DIAGNOSIS — Z87440 Personal history of urinary (tract) infections: Secondary | ICD-10-CM | POA: Insufficient documentation

## 2014-06-12 DIAGNOSIS — M797 Fibromyalgia: Secondary | ICD-10-CM | POA: Diagnosis not present

## 2014-06-12 DIAGNOSIS — R079 Chest pain, unspecified: Secondary | ICD-10-CM | POA: Diagnosis present

## 2014-06-12 DIAGNOSIS — Z87442 Personal history of urinary calculi: Secondary | ICD-10-CM | POA: Insufficient documentation

## 2014-06-12 DIAGNOSIS — Z8719 Personal history of other diseases of the digestive system: Secondary | ICD-10-CM | POA: Insufficient documentation

## 2014-06-12 MED ORDER — METHOCARBAMOL 500 MG PO TABS: 500.0000 mg | ORAL_TABLET | Freq: Two times a day (BID) | ORAL | Status: DC | PRN
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
## 2014-06-12 NOTE — Discharge Instructions (Signed)
°Emergency Department Resource Guide °1) Find a Doctor and Pay Out of Pocket °Although you won't have to find out who is covered by your insurance plan, it is a good idea to ask around and get recommendations. You will then need to call the office and see if the doctor you have chosen will accept you as a new patient and what types of options they offer for patients who are self-pay. Some doctors offer discounts or will set up payment plans for their patients who do not have insurance, but you will need to ask so you aren't surprised when you get to your appointment. ° °2) Contact Your Local Health Department °Not all health departments have doctors that can see patients for sick visits, but many do, so it is worth a call to see if yours does. If you don't know where your local health department is, you can check in your phone book. The CDC also has a tool to help you locate your state's health department, and many state websites also have listings of all of their local health departments. ° °3) Find a Walk-in Clinic °If your illness is not likely to be very severe or complicated, you may want to try a walk in clinic. These are popping up all over the country in pharmacies, drugstores, and shopping centers. They're usually staffed by nurse practitioners or physician assistants that have been trained to treat common illnesses and complaints. They're usually fairly quick and inexpensive. However, if you have serious medical issues or chronic medical problems, these are probably not your best option. ° °No Primary Care Doctor: °- Call [facility] at  832-8000 - they can help you locate a primary care doctor that  accepts your insurance, provides certain services, etc. °- Physician Referral Service- 1-800-533-3463 ° °Chronic Pain Problems: °Organization         Address  Phone   Notes  °[facility]  (336) 297-2271 Patients need to be referred by their primary care doctor.  ° °Medication  Assistance: °Organization         Address  Phone   Notes  °[place] [facility] [address] °[place], [place] [address] (336) 641-8030 --Must be a resident of [place] °-- Must have NO insurance coverage whatsoever (no Medicaid/ Medicare, etc.) °-- The pt. MUST have a primary care doctor that directs their care regularly and follows them in the community °  °[facility]  (866) 331-1348   °[employer]  (888) 892-1162   ° °Agencies that provide inexpensive medical care: °Organization         Address  Phone   Notes  °[facility]  (336) 832-8035   °[facility]    (336) 832-7272   °[facility] [address] °[place], [place] [address] (336) 832-4777   °[facility] [address], °[place] (336) 271-4999   °[facility]    (336) 373-0678   °[facility]    (336) 272-1050   °[facility] ° [address], [place] Phone:  (336) 832-4444, Fax:  (336) 832-4440 Hours of Operation:  9 am - 6 pm, M-F.  Also accepts Medicaid/Medicare and self-pay.  °[facility] ° [address], [place] Phone: (336) 832-3150, Fax: (336) 832-3151. Hours of Operation:  8:30 am - 5:30 pm, M-F.  Also accepts Medicaid and self-pay.  °[facility] [place] [address], [place] Phone: (336) 878-6027   °[facility] [address], [place], [place] (336)723-1848, Ext. 123 Mondays & Thursdays: 7-9 AM.  First 15 patients are seen on a first come, first serve basis. °  ° °Medicaid-accepting [place] Providers: ° °Organization         Address  Phone   Notes  °[facility] [address], [place] (336) 641-2100 Also accepts self-pay patients.  °[facility] [address], [place] ° (336) 856-9996   °[facility] [address], [place]  (336) 288-8857   °[facility] [address] [place] [address], [place] (336) 299-7000   °Veita Bland [address], [place]  ° (336) 373-1557 Only accepts [place] Access Medicaid patients after they have their name applied to their card.  ° °Self-Pay (no insurance) in [place]: ° °Organization         Address  Phone   Notes  °Sickle Cell Patients, [facility] [address], [place] (336) 832-1970   °[facility] [address], [place] (336) 832-4400   °[facility] ° [address], [place] (336) 992-4800   °[facility]/Dr. Osei-Bonsu ° [address] [place] [address], [place] or [address], [place] (336) 841-8500 Phone number for both [place] and [place] locations is the same.  °[facility] [address], [place] (336) 299-0000   °[facility] [address] [place] [address], [place] or [address] (336) 852-7530 °(336) 878-2260   °[facility] [address], [place] (336) 350-1642, phone; (336) 294-5005, fax Sees patients 1st and 3rd Saturday of every month.  Must not qualify for public or private insurance (i.e. Medicaid, Medicare, [place] Health Choice, Veterans' Benefits) • Household income should be no more than 200% of the poverty level •The clinic cannot treat you if you are pregnant or think you are pregnant • Sexually transmitted diseases are not treated at the clinic.  ° ° °Dental Care: °Organization         Address  Phone  Notes  °[place] [facility] [address], [place] (336) 641-6152 Accepts children up to age 21 who are enrolled in Medicaid or [place] Health Choice; pregnant women with a Medicaid card; and children who have applied for Medicaid or [place] Health Choice, but were declined, whose parents can pay a reduced fee at time of service.  °[place]  [facility] [place]  [address], [place] (336) 641-7733 Accepts children up to age 21 who are enrolled in Medicaid or [place] Health Choice; pregnant women with a Medicaid card; and children who have applied for Medicaid or  Health Choice, but were declined, whose parents can pay a reduced fee at time of service.  °[facility] ° [address], [place] (336) 641-4533 Patients are seen by appointment only. Walk-ins are not accepted. [facility] will see patients 18 years of age and older. °Monday - Tuesday (8am-5pm) °Most Wednesdays (8:30-5pm) °$30 per visit, cash only  °[facility] ° [address], [place] (336) 641-4533 Patients are seen by appointment only. Walk-ins are not accepted. [facility] will see patients 18 years of age and older. °One   Wednesday Evening (Monthly: Volunteer Based).  $30 per visit, cash only  °[facility]  (919) 537-3737 for adults; Children under age 4, call Graduate Pediatric Dentistry at ([phone]) [phone]. Children aged 4-14, please call (919) [phone] to request a pediatric application. ° Dental services are provided in all areas of dental care including fillings, crowns and bridges, complete and partial dentures, implants, gum treatment, root canals, and extractions. Preventive care is also provided. Treatment is provided to both adults and children. °Patients are selected via a lottery and there is often a waiting list. °  °[facility] [address], °[place] ° (336) 763-8833 [email or web address] °  °[facility] [address], [place], [place] (336)723-1848, Ext. 123 Second and Fourth Thursday of each month, opens at 6:30 AM; Clinic ends at 9 AM.  Patients are seen on a first-come first-served basis, and a limited number are seen during each clinic.  ° °[facility] ° [address], [place], [place] (336) 723-7904    Eligibility Requirements °You must have lived in [place], [place], or [place] counties for at least the last three months. °  You cannot be eligible for state or federal sponsored healthcare insurance, including Veterans Administration, Medicaid, or Medicare. °  You generally cannot be eligible for healthcare insurance through your employer.  °  How to apply: °Eligibility screenings are held every Tuesday and Wednesday afternoon from 1:00 pm until 4:00 pm. You do not need an appointment for the interview!  °[facility] [address], [place], [place] 336-631-2330   °[facility]  336-342-8273   °[facility]  336-703-3100   °[facility]  336-570-6415   ° °Behavioral Health Resources in the Community: °Intensive Outpatient Programs °Organization         Address  Phone  Notes  °[facility] [address], [place], [place] 336-878-6098   °[facility] [address], [place], [place] 336-832-9800   °[facility] [address], [place], [place] ° 336-882-2125   °[facility] [address],  °[place], [place] 1-800-853-5163 or 336-641-4981   °Substance Abuse Resources °Organization         Address  Phone  Notes  °[facility]  336-882-2125   °[facility]  336-784-9470   °[facility]  336-285-9073   °[facility]  336-845-3988   °Residential & Outpatient Substance Abuse Program  1-800-659-3381   °Psychological Services °Organization         Address  Phone  Notes  °[facility]  336- 832-9600   °[facility]  336- 378-7881   °[facility] [address], [place] 1-800-853-5163 or 336-641-4981   ° °Mobile Crisis Teams °Organization         Address  Phone  Notes  °[facility], Mobile Crisis Care Unit  1-877-626-1772   °Assertive °[facility] ° [address]  [place], [place] 336-834-9664   °Sharon DeEsch [address] °[place] 336-554-5454   ° °Self-Help/Support Groups °Organization         Address  Phone             Notes  °[facility] [place] - variety of support groups  336- 373-1402 Call for more information  °Narcotics Anonymous (NA), [facility] [address], °[place]  2 meetings at this location  ° °  Residential Treatment Programs °Organization         Address  Phone  Notes  °[facility] [address],    °[place]  1-866-801-8205   °[facility] ° [address], [place], [place] 704-293-8524   °[facility] [address], [place] 336-845-3988 Admissions: 8am-3pm M-F  °[facility] [address]    °[place], [place] 336-841-1104   °[facility] [address], [place], [place] 336-379-7146   °[facility] [address]  °[place], [place] 336-285-9073   °[facility] - Intensive Outpatient [address], [place], [place] 336-852-3033   °[facility] [address]  °[place], [place] 1-877-615-2722 or 336-784-9470   °[facility] [address] [place], [place] 336-227-7417 Accepts Medicaid  °[facility] [address]  ° [place] 1-800-659-3381 Substance Abuse/Addiction Treatment  ° °[place] Behavioral Health Resources °Organization         Address  Phone  Notes  °[facility]  (888) 581-9988   °Julie Brannon, PhD [address] [place], [place]   (336) 349-5553 or (336) 951-0000   °[facility]   [address] °[place], [place] (336) 349-4454   °[facility] [address], [place], [place] (336) 342-8316 Insurance/Medicaid/sponsorship through [facility]  °[facility] [address]                                    [place], [place] (336) 342-8316 Therapy/tele-psych/case    °[facility] [address]  ° [place], [place] (336) 349-2233    °Dr. Arfeen  (336) 349-4544   °[facility] [place]  [employer] [place] [facility] 1) [address], [place] °2) [address], [place] °3)  [address], [place] (336) 349-3220 °(336) 342-7768 ° °(336) 342-8140   °[place] [facility] Abuse [facility] (336) 342-1394 or (336) 342-3537 (After Hours)    ° ° °Take the prescription as directed.  Apply moist heat or ice to the area(s) of discomfort, for 15 minutes at a time, several times per day for the next few days.  Do not fall asleep on a heating or ice pack.  Call your regular medical doctor on Monday to schedule a follow up appointment this week.  Return to the Emergency Department immediately if worsening. ° °
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
## 2014-06-17 ENCOUNTER — Emergency Department (HOSPITAL_COMMUNITY)
Admission: EM | Admit: 2014-06-17 | Discharge: 2014-06-17 | Disposition: A | Discharge status: Home / Self Care | Payer: Medicare Other | Attending: Emergency Medicine | Admitting: Emergency Medicine

## 2014-06-17 ENCOUNTER — Encounter (HOSPITAL_COMMUNITY): Payer: Self-pay | Admitting: Family Medicine

## 2014-06-17 DIAGNOSIS — I10 Essential (primary) hypertension: Secondary | ICD-10-CM | POA: Diagnosis not present

## 2014-06-17 DIAGNOSIS — M797 Fibromyalgia: Secondary | ICD-10-CM | POA: Insufficient documentation

## 2014-06-17 DIAGNOSIS — Z8719 Personal history of other diseases of the digestive system: Secondary | ICD-10-CM | POA: Diagnosis not present

## 2014-06-17 DIAGNOSIS — F209 Schizophrenia, unspecified: Secondary | ICD-10-CM | POA: Insufficient documentation

## 2014-06-17 DIAGNOSIS — J45909 Unspecified asthma, uncomplicated: Secondary | ICD-10-CM | POA: Diagnosis not present

## 2014-06-17 DIAGNOSIS — D649 Anemia, unspecified: Secondary | ICD-10-CM | POA: Insufficient documentation

## 2014-06-17 DIAGNOSIS — E669 Obesity, unspecified: Secondary | ICD-10-CM | POA: Insufficient documentation

## 2014-06-17 DIAGNOSIS — Z85528 Personal history of other malignant neoplasm of kidney: Secondary | ICD-10-CM | POA: Insufficient documentation

## 2014-06-17 DIAGNOSIS — Z7951 Long term (current) use of inhaled steroids: Secondary | ICD-10-CM | POA: Insufficient documentation

## 2014-06-17 DIAGNOSIS — Z9889 Other specified postprocedural states: Secondary | ICD-10-CM | POA: Diagnosis not present

## 2014-06-17 DIAGNOSIS — R000 Tachycardia, unspecified: Secondary | ICD-10-CM | POA: Diagnosis not present

## 2014-06-17 DIAGNOSIS — Z87440 Personal history of urinary (tract) infections: Secondary | ICD-10-CM | POA: Diagnosis not present

## 2014-06-17 DIAGNOSIS — F319 Bipolar disorder, unspecified: Secondary | ICD-10-CM | POA: Insufficient documentation

## 2014-06-17 DIAGNOSIS — R51 Headache: Secondary | ICD-10-CM | POA: Diagnosis not present

## 2014-06-17 DIAGNOSIS — Z905 Acquired absence of kidney: Secondary | ICD-10-CM | POA: Insufficient documentation

## 2014-06-17 DIAGNOSIS — Z79899 Other long term (current) drug therapy: Secondary | ICD-10-CM | POA: Insufficient documentation

## 2014-06-17 DIAGNOSIS — Z87442 Personal history of urinary calculi: Secondary | ICD-10-CM | POA: Diagnosis not present

## 2014-06-17 DIAGNOSIS — R519 Headache, unspecified: Secondary | ICD-10-CM

## 2014-06-17 DIAGNOSIS — R011 Cardiac murmur, unspecified: Secondary | ICD-10-CM | POA: Diagnosis not present

## 2014-06-17 DIAGNOSIS — R4789 Other speech disturbances: Secondary | ICD-10-CM | POA: Insufficient documentation

## 2014-06-17 DIAGNOSIS — R42 Dizziness and giddiness: Secondary | ICD-10-CM

## 2014-06-17 MED ORDER — MECLIZINE HCL 25 MG PO TABS: 25.0000 mg | ORAL_TABLET | Freq: Three times a day (TID) | ORAL | Status: AC | PRN

## 2014-06-17 MED ORDER — MECLIZINE HCL 25 MG PO TABS
25.0000 mg | ORAL_TABLET | Freq: Once | ORAL | Status: AC
  Administered 2014-06-17: 25 mg via ORAL
  Filled 2014-06-17: qty 1

## 2014-06-17 NOTE — ED Notes (Signed)
Pt made aware to return if symptoms worsen or if any life threatening symptoms occur.   

## 2014-06-17 NOTE — Discharge Instructions (Signed)
Please read and follow all provided instructions.  Your diagnoses today include:  1. Vertigo   2. Acute nonintractable headache, unspecified headache type     Tests performed today include:  Vital signs. See below for your results today.   Medications:  In the Emergency Department you received:  Meclizine - antinausea/dizziness medication  Take any prescribed medications only as directed.  Additional information:  Follow any educational materials contained in this packet.  You are having a headache. No specific cause was found today for your headache. It may have been a migraine or other cause of headache. Stress, anxiety, fatigue, and depression are common triggers for headaches.   Your headache today does not appear to be life-threatening or require hospitalization, but often the exact cause of headaches is not determined in the emergency department. Therefore, follow-up with your doctor is very important to find out what may have caused your headache and whether or not you need any further diagnostic testing or treatment.   Sometimes headaches can appear benign (not harmful), but then more serious symptoms can develop which should prompt an immediate re-evaluation by your doctor or the emergency department.  BE VERY CAREFUL not to take multiple medicines containing Tylenol (also called acetaminophen). Doing so can lead to an overdose which can damage your liver and cause liver failure and possibly death.   Follow-up instructions: Please follow-up with your primary care provider in the next 3 days for further evaluation of your symptoms.   Return instructions:   Please return to the Emergency Department if you experience worsening symptoms.  Return if the medications do not resolve your headache, if it recurs, or if you have multiple episodes of vomiting or cannot keep down fluids.  Return if you have a change from the usual headache.  RETURN IMMEDIATELY IF you:  Develop a  sudden, severe headache  Develop confusion or become poorly responsive or faint  Develop a fever above 100.24F or problem breathing  Have a change in speech, vision, swallowing, or understanding  Develop new weakness, numbness, tingling, incoordination in your arms or legs  Have a seizure  Please return if you have any other emergent concerns.  Additional Information:  Your vital signs today were: BP 108/69 mmHg   Pulse 71   Temp(Src) 98.3 F (36.8 C)   Resp 14   SpO2 99% If your blood pressure (BP) was elevated above 135/85 this visit, please have this repeated by your doctor within one month. --------------
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
## 2014-08-13 DIAGNOSIS — M179 Osteoarthritis of knee, unspecified: Secondary | ICD-10-CM | POA: Diagnosis not present

## 2014-08-26 DIAGNOSIS — E559 Vitamin D deficiency, unspecified: Secondary | ICD-10-CM | POA: Diagnosis not present

## 2014-08-26 DIAGNOSIS — Z7289 Other problems related to lifestyle: Secondary | ICD-10-CM | POA: Diagnosis not present

## 2014-08-26 DIAGNOSIS — R7309 Other abnormal glucose: Secondary | ICD-10-CM | POA: Diagnosis not present

## 2014-08-26 DIAGNOSIS — J4520 Mild intermittent asthma, uncomplicated: Secondary | ICD-10-CM | POA: Diagnosis not present

## 2014-08-26 DIAGNOSIS — Z0000 Encounter for general adult medical examination without abnormal findings: Secondary | ICD-10-CM | POA: Diagnosis not present

## 2014-08-26 DIAGNOSIS — R05 Cough: Secondary | ICD-10-CM | POA: Diagnosis not present

## 2014-09-20 DIAGNOSIS — M545 Low back pain: Secondary | ICD-10-CM | POA: Diagnosis not present

## 2022-10-08 ENCOUNTER — Other Ambulatory Visit: Payer: Self-pay | Admitting: Internal Medicine

## 2022-10-08 DIAGNOSIS — Z1231 Encounter for screening mammogram for malignant neoplasm of breast: Secondary | ICD-10-CM

## 2022-10-10 ENCOUNTER — Ambulatory Visit
Admission: RE | Admit: 2022-10-10 | Discharge: 2022-10-10 | Disposition: A | Discharge status: Home / Self Care | Payer: Medicare Other | Source: Ambulatory Visit | Attending: Internal Medicine | Admitting: Internal Medicine

## 2022-10-10 DIAGNOSIS — Z1231 Encounter for screening mammogram for malignant neoplasm of breast: Secondary | ICD-10-CM

## 2022-10-12 ENCOUNTER — Other Ambulatory Visit: Payer: Self-pay | Admitting: Internal Medicine

## 2022-10-12 DIAGNOSIS — R928 Other abnormal and inconclusive findings on diagnostic imaging of breast: Secondary | ICD-10-CM

## 2022-10-18 ENCOUNTER — Other Ambulatory Visit: Payer: Self-pay | Admitting: Internal Medicine

## 2022-10-18 ENCOUNTER — Ambulatory Visit
Admission: RE | Admit: 2022-10-18 | Discharge: 2022-10-18 | Disposition: A | Discharge status: Home / Self Care | Payer: Medicare Other | Source: Ambulatory Visit | Attending: Internal Medicine | Admitting: Internal Medicine

## 2022-10-18 DIAGNOSIS — N6310 Unspecified lump in the right breast, unspecified quadrant: Secondary | ICD-10-CM

## 2022-10-18 DIAGNOSIS — R928 Other abnormal and inconclusive findings on diagnostic imaging of breast: Secondary | ICD-10-CM

## 2022-10-24 ENCOUNTER — Ambulatory Visit
Admission: RE | Admit: 2022-10-24 | Discharge: 2022-10-24 | Disposition: A | Discharge status: Home / Self Care | Payer: Medicare Other | Source: Ambulatory Visit | Attending: Internal Medicine | Admitting: Internal Medicine

## 2022-10-24 DIAGNOSIS — R928 Other abnormal and inconclusive findings on diagnostic imaging of breast: Secondary | ICD-10-CM

## 2022-10-24 DIAGNOSIS — N6310 Unspecified lump in the right breast, unspecified quadrant: Secondary | ICD-10-CM

## 2022-10-24 HISTORY — PX: BREAST BIOPSY: SHX20

## 2023-04-16 ENCOUNTER — Other Ambulatory Visit: Payer: Self-pay | Admitting: Internal Medicine

## 2023-04-16 DIAGNOSIS — R921 Mammographic calcification found on diagnostic imaging of breast: Secondary | ICD-10-CM

## 2023-07-03 ENCOUNTER — Ambulatory Visit
Admission: RE | Admit: 2023-07-03 | Discharge: 2023-07-03 | Disposition: A | Discharge status: Home / Self Care | Payer: Medicare Other | Source: Ambulatory Visit | Attending: Internal Medicine | Admitting: Internal Medicine

## 2023-07-03 DIAGNOSIS — R921 Mammographic calcification found on diagnostic imaging of breast: Secondary | ICD-10-CM

## 2023-10-17 ENCOUNTER — Other Ambulatory Visit: Payer: Self-pay | Admitting: Internal Medicine

## 2023-10-17 DIAGNOSIS — R921 Mammographic calcification found on diagnostic imaging of breast: Secondary | ICD-10-CM

## 2023-11-07 ENCOUNTER — Encounter

## 2023-11-15 ENCOUNTER — Encounter

## 2023-11-27 ENCOUNTER — Encounter

## 2023-12-05 ENCOUNTER — Ambulatory Visit
Admission: RE | Admit: 2023-12-05 | Discharge: 2023-12-05 | Disposition: A | Discharge status: Home / Self Care | Source: Ambulatory Visit | Attending: Internal Medicine | Admitting: Internal Medicine

## 2023-12-05 DIAGNOSIS — R921 Mammographic calcification found on diagnostic imaging of breast: Secondary | ICD-10-CM

## 2024-02-10 ENCOUNTER — Encounter (HOSPITAL_BASED_OUTPATIENT_CLINIC_OR_DEPARTMENT_OTHER): Payer: Self-pay

## 2024-02-10 ENCOUNTER — Other Ambulatory Visit: Payer: Self-pay

## 2024-02-10 ENCOUNTER — Observation Stay (HOSPITAL_BASED_OUTPATIENT_CLINIC_OR_DEPARTMENT_OTHER)
Admission: EM | Admit: 2024-02-10 | Discharge: 2024-02-13 | Disposition: A | Discharge status: Home / Self Care | Attending: Internal Medicine | Admitting: Internal Medicine

## 2024-02-10 ENCOUNTER — Emergency Department (HOSPITAL_BASED_OUTPATIENT_CLINIC_OR_DEPARTMENT_OTHER): Admitting: Radiology

## 2024-02-10 DIAGNOSIS — D649 Anemia, unspecified: Secondary | ICD-10-CM | POA: Insufficient documentation

## 2024-02-10 DIAGNOSIS — I890 Lymphedema, not elsewhere classified: Secondary | ICD-10-CM | POA: Diagnosis not present

## 2024-02-10 DIAGNOSIS — R079 Chest pain, unspecified: Principal | ICD-10-CM | POA: Diagnosis present

## 2024-02-10 DIAGNOSIS — Z6839 Body mass index (BMI) 39.0-39.9, adult: Secondary | ICD-10-CM | POA: Diagnosis not present

## 2024-02-10 DIAGNOSIS — I129 Hypertensive chronic kidney disease with stage 1 through stage 4 chronic kidney disease, or unspecified chronic kidney disease: Secondary | ICD-10-CM | POA: Insufficient documentation

## 2024-02-10 DIAGNOSIS — I519 Heart disease, unspecified: Secondary | ICD-10-CM | POA: Diagnosis not present

## 2024-02-10 DIAGNOSIS — F259 Schizoaffective disorder, unspecified: Secondary | ICD-10-CM | POA: Insufficient documentation

## 2024-02-10 DIAGNOSIS — N1832 Chronic kidney disease, stage 3b: Secondary | ICD-10-CM | POA: Diagnosis not present

## 2024-02-10 DIAGNOSIS — R0600 Dyspnea, unspecified: Secondary | ICD-10-CM | POA: Diagnosis not present

## 2024-02-10 DIAGNOSIS — J45909 Unspecified asthma, uncomplicated: Secondary | ICD-10-CM | POA: Diagnosis not present

## 2024-02-10 DIAGNOSIS — F319 Bipolar disorder, unspecified: Secondary | ICD-10-CM | POA: Insufficient documentation

## 2024-02-10 DIAGNOSIS — I5189 Other ill-defined heart diseases: Secondary | ICD-10-CM

## 2024-02-10 DIAGNOSIS — E785 Hyperlipidemia, unspecified: Secondary | ICD-10-CM | POA: Diagnosis not present

## 2024-02-10 DIAGNOSIS — E66812 Obesity, class 2: Secondary | ICD-10-CM | POA: Diagnosis present

## 2024-02-10 DIAGNOSIS — I10 Essential (primary) hypertension: Secondary | ICD-10-CM | POA: Diagnosis present

## 2024-02-10 DIAGNOSIS — F1290 Cannabis use, unspecified, uncomplicated: Secondary | ICD-10-CM | POA: Diagnosis not present

## 2024-02-10 DIAGNOSIS — R0789 Other chest pain: Secondary | ICD-10-CM | POA: Diagnosis not present

## 2024-02-10 DIAGNOSIS — R0602 Shortness of breath: Secondary | ICD-10-CM | POA: Diagnosis present

## 2024-02-10 DIAGNOSIS — R7989 Other specified abnormal findings of blood chemistry: Secondary | ICD-10-CM | POA: Diagnosis not present

## 2024-02-10 DIAGNOSIS — R4182 Altered mental status, unspecified: Secondary | ICD-10-CM | POA: Diagnosis not present

## 2024-02-10 LAB — BASIC METABOLIC PANEL WITH GFR
Anion gap: 15 (ref 5–15)
BUN: 34 mg/dL — ABNORMAL HIGH (ref 8–23)
CO2: 20 mmol/L — ABNORMAL LOW (ref 22–32)
Calcium: 9.6 mg/dL (ref 8.9–10.3)
Chloride: 108 mmol/L (ref 98–111)
Creatinine, Ser: 1.63 mg/dL — ABNORMAL HIGH (ref 0.44–1.00)
GFR, Estimated: 32 mL/min — ABNORMAL LOW (ref >60)
Glucose, Bld: 100 mg/dL — ABNORMAL HIGH (ref 70–99)
Potassium: 3.8 mmol/L (ref 3.5–5.1)
Sodium: 142 mmol/L (ref 135–145)

## 2024-02-10 LAB — CBC
HCT: 30.7 % — ABNORMAL LOW (ref 36.0–46.0)
Hemoglobin: 9.7 g/dL — ABNORMAL LOW (ref 12.0–15.0)
MCH: 27 pg (ref 26.0–34.0)
MCHC: 31.6 g/dL (ref 30.0–36.0)
MCV: 85.5 fL (ref 80.0–100.0)
Platelets: 304 K/uL (ref 150–400)
RBC: 3.59 MIL/uL — ABNORMAL LOW (ref 3.87–5.11)
RDW: 14.5 % (ref 11.5–15.5)
WBC: 7.7 K/uL (ref 4.0–10.5)
nRBC: 0 % (ref 0.0–0.2)

## 2024-02-10 NOTE — ED Triage Notes (Signed)
 Shortness of breath increased need for breathing treatments, weakness, and increased pain, episodes of loss of bowel control. Patient appears in no acute distress in triage. Son reports episodes of disorientation, patient reports recently changing to marijuana gummies.

## 2024-02-10 NOTE — ED Notes (Signed)
 Assessed patient per Heidi Young request. Pt breathing equal, regular, and unlabored. Explained to patient pending room availability to be seen by provider.

## 2024-02-10 NOTE — ED Notes (Signed)
 Pt states that her pain is getting worse, and reports that her generalized pain has been getting worse x2 weeks. Vitals stable. Pt A&Ox4. No signs of distress.

## 2024-02-11 ENCOUNTER — Emergency Department (HOSPITAL_BASED_OUTPATIENT_CLINIC_OR_DEPARTMENT_OTHER)

## 2024-02-11 ENCOUNTER — Observation Stay (HOSPITAL_COMMUNITY)

## 2024-02-11 DIAGNOSIS — J45909 Unspecified asthma, uncomplicated: Secondary | ICD-10-CM | POA: Diagnosis not present

## 2024-02-11 DIAGNOSIS — F1290 Cannabis use, unspecified, uncomplicated: Secondary | ICD-10-CM | POA: Diagnosis not present

## 2024-02-11 DIAGNOSIS — E785 Hyperlipidemia, unspecified: Secondary | ICD-10-CM | POA: Diagnosis not present

## 2024-02-11 DIAGNOSIS — N200 Calculus of kidney: Secondary | ICD-10-CM | POA: Insufficient documentation

## 2024-02-11 DIAGNOSIS — N1832 Chronic kidney disease, stage 3b: Secondary | ICD-10-CM | POA: Diagnosis not present

## 2024-02-11 DIAGNOSIS — R7989 Other specified abnormal findings of blood chemistry: Secondary | ICD-10-CM | POA: Diagnosis not present

## 2024-02-11 DIAGNOSIS — I129 Hypertensive chronic kidney disease with stage 1 through stage 4 chronic kidney disease, or unspecified chronic kidney disease: Secondary | ICD-10-CM | POA: Diagnosis not present

## 2024-02-11 DIAGNOSIS — F319 Bipolar disorder, unspecified: Secondary | ICD-10-CM | POA: Diagnosis not present

## 2024-02-11 DIAGNOSIS — R079 Chest pain, unspecified: Principal | ICD-10-CM | POA: Diagnosis present

## 2024-02-11 DIAGNOSIS — D649 Anemia, unspecified: Secondary | ICD-10-CM | POA: Diagnosis present

## 2024-02-11 DIAGNOSIS — I5189 Other ill-defined heart diseases: Secondary | ICD-10-CM

## 2024-02-11 DIAGNOSIS — I519 Heart disease, unspecified: Secondary | ICD-10-CM | POA: Diagnosis not present

## 2024-02-11 DIAGNOSIS — R0600 Dyspnea, unspecified: Secondary | ICD-10-CM | POA: Diagnosis not present

## 2024-02-11 DIAGNOSIS — Z6839 Body mass index (BMI) 39.0-39.9, adult: Secondary | ICD-10-CM | POA: Diagnosis not present

## 2024-02-11 DIAGNOSIS — F259 Schizoaffective disorder, unspecified: Secondary | ICD-10-CM | POA: Diagnosis not present

## 2024-02-11 DIAGNOSIS — R0602 Shortness of breath: Secondary | ICD-10-CM | POA: Diagnosis present

## 2024-02-11 DIAGNOSIS — E66812 Obesity, class 2: Secondary | ICD-10-CM | POA: Diagnosis present

## 2024-02-11 DIAGNOSIS — R0789 Other chest pain: Secondary | ICD-10-CM | POA: Diagnosis not present

## 2024-02-11 DIAGNOSIS — I890 Lymphedema, not elsewhere classified: Secondary | ICD-10-CM | POA: Diagnosis not present

## 2024-02-11 LAB — D-DIMER, QUANTITATIVE: D-Dimer, Quant: 2.62 ug{FEU}/mL — ABNORMAL HIGH (ref 0.00–0.50)

## 2024-02-11 LAB — TROPONIN T, HIGH SENSITIVITY
Troponin T High Sensitivity: 19 ng/L (ref 0–19)
Troponin T High Sensitivity: 19 ng/L (ref 0–19)

## 2024-02-11 LAB — PRO BRAIN NATRIURETIC PEPTIDE: Pro Brain Natriuretic Peptide: 401 pg/mL — ABNORMAL HIGH (ref <300.0)

## 2024-02-11 MED ORDER — METHOCARBAMOL 500 MG PO TABS
500.0000 mg | ORAL_TABLET | Freq: Two times a day (BID) | ORAL | Status: DC | PRN
  Administered 2024-02-11: 500 mg via ORAL
  Filled 2024-02-11: qty 1

## 2024-02-11 MED ORDER — METOPROLOL TARTRATE 50 MG PO TABS
50.0000 mg | ORAL_TABLET | Freq: Three times a day (TID) | ORAL | Status: DC
  Administered 2024-02-11 – 2024-02-13 (×7): 50 mg via ORAL
  Filled 2024-02-11 (×7): qty 1

## 2024-02-11 MED ORDER — ENOXAPARIN SODIUM 120 MG/0.8ML IJ SOSY
120.0000 mg | PREFILLED_SYRINGE | Freq: Once | INTRAMUSCULAR | Status: AC
  Administered 2024-02-11: 120 mg via SUBCUTANEOUS
  Filled 2024-02-11: qty 0.8

## 2024-02-11 MED ORDER — ACETAMINOPHEN 325 MG PO TABS
650.0000 mg | ORAL_TABLET | Freq: Four times a day (QID) | ORAL | Status: DC | PRN
Start: 2024-02-11 — End: 2024-02-13
  Administered 2024-02-11: 650 mg via ORAL
  Filled 2024-02-11: qty 2

## 2024-02-11 MED ORDER — FUROSEMIDE 40 MG PO TABS: 40.0000 mg | ORAL_TABLET | Freq: Every day | ORAL | Status: DC | PRN

## 2024-02-11 MED ORDER — AMLODIPINE BESYLATE 10 MG PO TABS
10.0000 mg | ORAL_TABLET | Freq: Every day | ORAL | Status: DC
  Administered 2024-02-12 – 2024-02-13 (×2): 10 mg via ORAL
  Filled 2024-02-11 (×2): qty 1

## 2024-02-11 MED ORDER — GABAPENTIN 300 MG PO CAPS
300.0000 mg | ORAL_CAPSULE | Freq: Three times a day (TID) | ORAL | Status: DC
  Administered 2024-02-11 – 2024-02-13 (×6): 300 mg via ORAL
  Filled 2024-02-11 (×6): qty 1

## 2024-02-11 MED ORDER — AMLODIPINE BESYLATE 5 MG PO TABS
5.0000 mg | ORAL_TABLET | Freq: Every day | ORAL | Status: DC
  Administered 2024-02-11: 5 mg via ORAL
  Filled 2024-02-11: qty 1

## 2024-02-11 MED ORDER — AMLODIPINE BESYLATE 10 MG PO TABS
10.0000 mg | ORAL_TABLET | Freq: Every day | ORAL | Status: DC
Start: 2024-02-11 — End: 2024-02-11

## 2024-02-11 MED ORDER — TRAMADOL HCL 50 MG PO TABS
50.0000 mg | ORAL_TABLET | Freq: Three times a day (TID) | ORAL | Status: DC | PRN
  Administered 2024-02-12 (×2): 50 mg via ORAL
  Filled 2024-02-11 (×2): qty 1

## 2024-02-11 MED ORDER — ACETAMINOPHEN 650 MG RE SUPP: 650.0000 mg | Freq: Four times a day (QID) | RECTAL | Status: DC | PRN

## 2024-02-11 MED ORDER — ENOXAPARIN SODIUM 40 MG/0.4ML IJ SOSY
40.0000 mg | PREFILLED_SYRINGE | INTRAMUSCULAR | Status: DC
  Administered 2024-02-12: 40 mg via SUBCUTANEOUS
  Filled 2024-02-11: qty 0.4

## 2024-02-11 MED ORDER — ATORVASTATIN CALCIUM 10 MG PO TABS
20.0000 mg | ORAL_TABLET | Freq: Every day | ORAL | Status: DC
  Administered 2024-02-11 – 2024-02-13 (×3): 20 mg via ORAL
  Filled 2024-02-11 (×3): qty 2

## 2024-02-11 MED ORDER — LACTATED RINGERS IV BOLUS
1000.0000 mL | Freq: Once | INTRAVENOUS | Status: AC
  Administered 2024-02-11: 1000 mL via INTRAVENOUS

## 2024-02-11 MED ORDER — IOHEXOL 350 MG/ML SOLN: 65.0000 mL | Freq: Once | INTRAVENOUS | Status: DC | PRN

## 2024-02-11 MED ORDER — TECHNETIUM TO 99M ALBUMIN AGGREGATED
4.4000 | Freq: Once | INTRAVENOUS | Status: AC | PRN
  Administered 2024-02-11: 4.4 via INTRAVENOUS

## 2024-02-11 MED ORDER — CARVEDILOL 25 MG PO TABS
25.0000 mg | ORAL_TABLET | Freq: Two times a day (BID) | ORAL | Status: DC
  Administered 2024-02-11: 25 mg via ORAL
  Filled 2024-02-11: qty 2

## 2024-02-11 MED ORDER — ONDANSETRON HCL 4 MG PO TABS
4.0000 mg | ORAL_TABLET | Freq: Four times a day (QID) | ORAL | Status: DC | PRN
  Administered 2024-02-12: 4 mg via ORAL
  Filled 2024-02-11: qty 1

## 2024-02-11 MED ORDER — POLYETHYLENE GLYCOL 3350 17 G PO PACK: 17.0000 g | PACK | Freq: Every day | ORAL | Status: DC | PRN

## 2024-02-11 MED ORDER — ALBUTEROL SULFATE (2.5 MG/3ML) 0.083% IN NEBU: 2.5000 mg | INHALATION_SOLUTION | RESPIRATORY_TRACT | Status: DC | PRN

## 2024-02-11 MED ORDER — QUETIAPINE FUMARATE ER 50 MG PO TB24
150.0000 mg | ORAL_TABLET | Freq: Every day | ORAL | Status: DC
  Administered 2024-02-11 – 2024-02-12 (×2): 150 mg via ORAL
  Filled 2024-02-11 (×3): qty 3

## 2024-02-11 MED ORDER — LACTULOSE 10 GM/15ML PO SOLN: 20.0000 g | Freq: Every day | ORAL | Status: DC | PRN

## 2024-02-11 MED ORDER — ONDANSETRON HCL 4 MG/2ML IJ SOLN
4.0000 mg | Freq: Four times a day (QID) | INTRAMUSCULAR | Status: DC | PRN
  Administered 2024-02-13: 4 mg via INTRAVENOUS
  Filled 2024-02-11: qty 2

## 2024-02-11 MED ORDER — IPRATROPIUM-ALBUTEROL 0.5-2.5 (3) MG/3ML IN SOLN
3.0000 mL | Freq: Once | RESPIRATORY_TRACT | Status: AC
  Administered 2024-02-11: 3 mL via RESPIRATORY_TRACT
  Filled 2024-02-11: qty 3

## 2024-02-11 MED ORDER — ALBUTEROL SULFATE HFA 108 (90 BASE) MCG/ACT IN AERS: 2.0000 | INHALATION_SPRAY | Freq: Every day | RESPIRATORY_TRACT | Status: DC | PRN

## 2024-02-11 MED ORDER — MIRTAZAPINE 15 MG PO TABS
45.0000 mg | ORAL_TABLET | Freq: Every day | ORAL | Status: DC
Start: 2024-02-11 — End: 2024-02-13
  Administered 2024-02-11 – 2024-02-12 (×2): 45 mg via ORAL
  Filled 2024-02-11 (×2): qty 3

## 2024-02-11 MED ORDER — FLUTICASONE FUROATE-VILANTEROL 100-25 MCG/ACT IN AEPB
1.0000 | INHALATION_SPRAY | Freq: Every day | RESPIRATORY_TRACT | Status: DC
  Administered 2024-02-12 – 2024-02-13 (×2): 1 via RESPIRATORY_TRACT
  Filled 2024-02-11: qty 28

## 2024-02-11 MED ORDER — MIRABEGRON ER 25 MG PO TB24: 25.0000 mg | ORAL_TABLET | Freq: Every day | ORAL | Status: DC | PRN

## 2024-02-11 NOTE — ED Notes (Signed)
 Pt ambulated around the unit with no complaints of sob,chest pain. Pt reports mild dizziness. Pt sp02 maintain 96-98%.

## 2024-02-11 NOTE — ED Notes (Signed)
[facility] at bedside to transport patient. 

## 2024-02-11 NOTE — ED Notes (Signed)
 Patient had episode of urinary incontinence. Patient and bed cleaned and given fresh linens. Patient states she uses pure wick at home. Patient placed on pure wick at this time.

## 2024-02-11 NOTE — H&P (Signed)
 History and Physical    Patient: Heidi Young FMW:[id number] DOB: Jan 08, 1946 DOA: 02/10/2024 DOS: the patient was seen and examined on 02/11/2024 PCP: [name], MD  Patient coming from: Home  Chief Complaint:  Chief Complaint  Patient presents with   Shortness of Breath   Weakness   Multiple Complaints   HPI: Heidi Young is a 78 y.o. female with medical history significant of asthma, psychosis, schizophrenia, depression, bipolar 1 disorder, renal cell carcinoma, Takotsubo cardiomyopathy, left carotid bruit, chronic anemia, osteoarthritis, fibromyalgia, GERD, nephrolithiasis, hyperlipidemia, lumbar herniated disc, hypertension, heart murmur, chest pain please who presented to the emergency department with complaints of dyspnea, generalized weakness, arthralgias who presented to the emergency department with complaints of having sudden worsening dyspnea associated with cough and chest tightness.  Cough is nonproductive.  No  palpitations, diaphoresis, PND, orthopnea, but occasionally develops pitting edema of the lower extremities.  She denied fever, chills, rhinorrhea, sore throat, wheezing or hemoptysis.  No abdominal pain, nausea, emesis, diarrhea, constipation, melena or hematochezia.  No flank pain, dysuria, frequency or hematuria.  No polyuria, polydipsia, polyphagia or blurred vision.  The patient spends a lot of time sitting or in bed.  Lab work: CBC showed a white count of 7.7, hemoglobin 9.7 g/dL and platelets 695.  D-dimer was 2.62.  Troponin x 2 normal.  proBNP 401.0 pg/mL.  BMP showed a CO2 of 20 mmol/L with a normal anion gap, the rest of the electrolytes were normal.  Glucose 100, BUN 34 and creatinine 1.64 mg/dL.  Imaging: 2 view chest radiograph showing elevation of the right hemidiaphragm, similar to slightly increased when compared to prior exam.  Recommend chest CT for further assessment if clinically warranted.  CT head without contrast with no acute  intracranial normality.  VQ scan was very low probability for PE.  ED course: Initial vital signs were temperature 98.2 F, pulse 101, respiration 19, BP 157/98 mmHg and O2 sat 98% on room air.  The patient received LR bolus 1000 mL.   Review of Systems: As mentioned in the history of present illness. All other systems reviewed and are negative. Past Medical History:  Diagnosis Date   Asthma    Bipolar 1 disorder (HCC)    Cancer (HCC)    renal cell carcinoma   Cardiomyopathy    Carotid bruit    left   Chest pain    Chronic anemia    DJD (degenerative joint disease)    Fibromyalgia    GERD (gastroesophageal reflux disease)    on rare occas. - uses peptobismol   Heart murmur    History of kidney cancer    unknown type. had R kidney removed b/c of CA.    HLD (hyperlipidemia)    HTN (hypertension)    Kidney stones    Lumbar herniated disc    Obesity    Psychosis (HCC)    Schizophrenia (HCC)    Single kidney    Takotsubo cardiomyopathy    UTI (lower urinary tract infection)    Past Surgical History:  Procedure Laterality Date   BREAST BIOPSY Right 10/24/2022   US  RT BREAST BX W LOC DEV 1ST LESION IMG BX SPEC US  GUIDE 10/24/2022 [facility]   BREAST LUMPECTOMY  90's   left, cyst   CARDIAC CATHETERIZATION  2010   no intervention   CESAREAN SECTION  1980   x1   CYSTECTOMY  90's   back (left)   NEPHRECTOMY  1990's   right, secondary to cyst  TOTAL KNEE ARTHROPLASTY  2005   right   TOTAL KNEE ARTHROPLASTY Left 03/25/2013   Procedure: TOTAL KNEE ARTHROPLASTY;  Surgeon: Norleen LITTIE Gavel, MD;  Location: [facility];  Service: Orthopedics;  Laterality: Left;   Social History:  reports that she has never smoked. She does not have any smokeless tobacco history on file. She reports current drug use. Frequency: 1.00 time per week. Drug: Marijuana. She reports that she does not drink alcohol.  Allergies  Allergen Reactions   Bee Venom Anaphylaxis   Other Anaphylaxis    Red ant's,  roaches    Avelox [Moxifloxacin Hydrochloride] Rash and Other (See Comments)    Fever / Upset Stomach   Nsaids Other (See Comments)    Due to stomach ulcers    Family History  Problem Relation Age of Onset   Coronary artery disease Mother 12   Heart failure Mother        congestive   Stroke Mother    Brain cancer Father 36   Hypertension Other        siblings   Aortic stenosis Other        sibling    Prior to Admission medications   Medication Sig Start Date End Date Taking? Authorizing Provider  albuterol  (PROVENTIL  HFA;VENTOLIN  HFA) 108 (90 BASE) MCG/ACT inhaler Inhale 2 puffs into the lungs 5 (five) times daily as needed for wheezing or shortness of breath.    [provider]  albuterol  (PROVENTIL ) (2.5 MG/3ML) 0.083% nebulizer solution Take 2.5 mg by nebulization every 4 (four) hours as needed for wheezing or shortness of breath.    [provider]  amLODipine  (NORVASC ) 10 MG tablet Take 10 mg by mouth daily. 02/04/24   [provider]  atorvastatin  (LIPITOR) 20 MG tablet Take 20 mg by mouth daily. 02/04/24   [provider]  carvedilol  (COREG ) 25 MG tablet Take 25 mg by mouth 2 (two) times daily with a meal.    [provider]  CONSTULOSE  10 GM/15ML solution Take 20 g by mouth daily. 10/18/23   [provider]  EPINEPHrine (EPIPEN) 0.3 mg/0.3 mL IJ SOAJ injection Inject 0.3 mg into the muscle as needed (allergic reaction).    [provider]  folic acid  (FOLVITE ) 1 MG tablet Take 1 mg by mouth daily.    [provider]  furosemide  (LASIX ) 40 MG tablet Take 40 mg by mouth daily as needed (for fluid retention.).    [provider]  gabapentin  (NEURONTIN ) 300 MG capsule Take 300 mg by mouth 3 (three) times daily. 02/04/24   [provider]  levocetirizine (XYZAL) 5 MG tablet Take 5 mg by mouth every evening.    [provider]  meclizine  (ANTIVERT ) 25 MG tablet Take 1 tablet (25 mg total)  by mouth 3 (three) times daily as needed. 06/17/14   Geiple, Joshua, PA-C  melatonin 3 MG TABS tablet Take 3 mg by mouth at bedtime. 12/21/23   [provider]  methocarbamol  (ROBAXIN ) 500 MG tablet Take 1 tablet (500 mg total) by mouth 2 (two) times daily as needed for muscle spasms. 06/12/14   Joyice Sauer, DO  metoprolol  tartrate (LOPRESSOR ) 50 MG tablet Take 50 mg by mouth 3 (three) times daily. 02/04/24   [provider]  mirabegron  ER (MYRBETRIQ ) 25 MG TB24 tablet Take 25 mg by mouth daily as needed (for urinary frequency with lasix ).    [provider]  mirtazapine  (REMERON ) 45 MG tablet Take 45 mg by mouth at  bedtime. 02/04/24   [provider]  mometasone -formoterol  (DULERA ) 100-5 MCG/ACT AERO Inhale 2 puffs into the lungs 2 (two) times daily.    [provider]  ondansetron  (ZOFRAN -ODT) 4 MG disintegrating tablet Take 4 mg by mouth every 6 (six) hours as needed for nausea or vomiting.    [provider]  OVER THE COUNTER MEDICATION Take 4 tablets by mouth daily. Papaya supplement for nausea    [provider]  polyethylene glycol (MIRALAX  / GLYCOLAX ) packet Take 17 g by mouth daily as needed (constipation). Mix in 8 oz liquid and drink    [provider]  potassium chloride  SA (K-DUR,KLOR-CON ) 20 MEQ tablet Take 20 mEq by mouth daily as needed (takes with lasix  for fluid retention).    [provider]  QUEtiapine  Fumarate (SEROQUEL  XR) 150 MG 24 hr tablet Take 150 mg by mouth at bedtime.    [provider]  tiZANidine  (ZANAFLEX ) 4 MG tablet Take 4 mg by mouth every 8 (eight) hours as needed for muscle spasms.    [provider]  traMADol  (ULTRAM ) 50 MG tablet Take 50 mg by mouth 3 (three) times daily as needed (pain).    [provider]  VITAMIN D , CHOLECALCIFEROL , PO Take 10,000 Units by mouth daily.    [provider]    Physical Exam: Vitals:   02/11/24 0900 02/11/24  1000 02/11/24 1002 02/11/24 1058  BP: (!) 147/63 116/75  132/74  Pulse: 85 62  79  Resp:  18  (!) 24  Temp:    98.4 F (36.9 C)  TempSrc:    Oral  SpO2: 93% 97% 99% 100%  Weight:      Height:       Physical Exam Vitals and nursing note reviewed.  Constitutional:      Appearance: She is well-developed. She is obese. She is ill-appearing.  HENT:     Head: Normocephalic.  Eyes:     Pupils: Pupils are equal, round, and reactive to light.  Neck:     Vascular: No JVD.  Cardiovascular:     Rate and Rhythm: Normal rate and regular rhythm.  Pulmonary:     Breath sounds: No wheezing, rhonchi or rales.  Musculoskeletal:     Cervical back: Neck supple.     Right lower leg: No edema.     Left lower leg: No edema.  Skin:    General: Skin is warm and dry.  Neurological:     General: No focal deficit present.     Mental Status: She is alert and oriented to person, place, and time.  Psychiatric:        Mood and Affect: Mood normal.        Behavior: Behavior normal.     Data Reviewed:  Results are pending, will review when available. 12/17/2013 echocardiogram report. -----------------  Study Conclusions   - Left ventricle: The cavity size was normal. Systolic function was    normal. The estimated ejection fraction was in the range of 60%    to 65%. Wall motion was normal; there were no regional wall    motion abnormalities. Doppler parameters are consistent with    abnormal left ventricular relaxation (grade 1 diastolic    dysfunction).  - Mitral valve: There was mild regurgitation.   EKG: Vent. rate 103 BPM PR interval 176 ms QRS duration 72 ms QT/QTcB 334/437 ms P-R-T axes 54 36 5 Sinus tachycardia Cannot rule out Anterior infarct , age undetermined Abnormal ECG  Assessment and Plan: Principal Problem:   Chest pain Observation/telemetry. Troponin level negative. VQ scan low probability. Continue beta-blocker 3 times daily. Sublingual nitroglycerin  as  needed. Will obtain an echocardiogram.  Active Problems:   Lymphedema Spends a lot of time sitting or in bed. With elevated D-dimer will also check for DVT.    Grade I diastolic dysfunction Continue beta-blocker and CCB. Will evaluate progression with echocardiogram.    Hyperlipidemia Continue atorvastatin  40 mg p.o. daily.    Essential hypertension Continue amlodipine  10 mg p.o. daily. Continue metoprolol  50 mg p.o. 3 times daily.    Asthma Bronchodilators as needed.    Bipolar 1 disorder (HCC)   Schizoaffective disorder (HCC) Continue mirtazapine  45 mg p.o. bedtime. Continue quetiapine  XR 150 mg p.o. at bedtime.    Class 2 obesity Current BMI  kg/m. Would benefit from lifestyle modifications. Follow-up closely with PCP and/or bariatric clinic.    CKD stage 3b, GFR 30-44 ml/min (HCC) Monitor renal function electrolytes. Avoid nephrotoxins.    Normocytic anemia Monitor hematocrit and hemoglobin.     Advance Care Planning:   Code Status: Full Code   Consults:   Family Communication: I spoke to her son JINNY.  Elnor via telephone.  Severity of Illness: The appropriate patient status for this patient is OBSERVATION. Observation status is judged to be reasonable and necessary in order to provide the required intensity of service to ensure the patient's safety. The patient's presenting symptoms, physical exam findings, and initial radiographic and laboratory data in the context of their medical condition is felt to place them at decreased risk for further clinical deterioration. Furthermore, it is anticipated that the patient will be medically stable for discharge from the hospital within 2 midnights of admission.   Author: Alm Dorn Castor, MD 02/11/2024 11:20 AM  For on call review [email or web address].   This document was prepared using Dragon voice recognition software and may contain some unintended transcription errors.

## 2024-02-11 NOTE — Progress Notes (Signed)
 PHARMACY - ANTICOAGULATION CONSULT NOTE  Pharmacy Consult for Lovenox  Indication: suspected PE  Allergies  Allergen Reactions   Bee Venom Anaphylaxis   Other Anaphylaxis    Red ant's, roaches    Avelox [Moxifloxacin Hydrochloride] Rash and Other (See Comments)    Fever / Upset Stomach   Nsaids Other (See Comments)    Due to stomach ulcers   Ciprofloxacin Other (See Comments)    Upset stomach    Patient Measurements: Height: 5' 7 (170.2 cm) Weight: 113.4 kg (250 lb) IBW/kg (Calculated) : 61.6 HEPARIN  DW (KG): 87.9  Vital Signs: Temp: 98.4 F (36.9 C) (08/26 1058) Temp Source: Oral (08/26 1058) BP: 142/80 (08/26 1135) Pulse Rate: 79 (08/26 1058)  Labs: Recent Labs    02/10/24 1918  HGB 9.7*  HCT 30.7*  PLT 304  CREATININE 1.63*    Estimated Creatinine Clearance: 37 mL/min (A) (by C-G formula based on SCr of 1.63 mg/dL (H)).   Medical History: Past Medical History:  Diagnosis Date   Asthma    Bipolar 1 disorder (HCC)    Cancer (HCC)    renal cell carcinoma   Cardiomyopathy    Carotid bruit    left   Chest pain    Chronic anemia    DJD (degenerative joint disease)    Fibromyalgia    GERD (gastroesophageal reflux disease)    on rare occas. - uses peptobismol   Heart murmur    History of kidney cancer    unknown type. had R kidney removed b/c of CA.    HLD (hyperlipidemia)    HTN (hypertension)    Kidney stones    Lumbar herniated disc    Obesity    Psychosis (HCC)    Schizophrenia (HCC)    Single kidney    Takotsubo cardiomyopathy    UTI (lower urinary tract infection)     Medications:  Medications Prior to Admission  Medication Sig Dispense Refill Last Dose/Taking   albuterol  (PROVENTIL  HFA;VENTOLIN  HFA) 108 (90 BASE) MCG/ACT inhaler Inhale 2 puffs into the lungs 5 (five) times daily as needed for wheezing or shortness of breath.   02/10/2024   albuterol  (PROVENTIL ) (2.5 MG/3ML) 0.083% nebulizer solution Take 2.5 mg by nebulization every 4  (four) hours as needed for wheezing or shortness of breath.   02/10/2024   amLODipine  (NORVASC ) 10 MG tablet Take 10 mg by mouth daily.   02/10/2024   atorvastatin  (LIPITOR) 20 MG tablet Take 20 mg by mouth at bedtime.   Past Week   Bismuth Subsalicylate (KAOPECTATE PO) Take 1 Dose by mouth 2 (two) times daily as needed (Diarrhea).   Past Week   CONSTULOSE  10 GM/15ML solution Take 20 g by mouth daily as needed for moderate constipation.   02/10/2024   EPINEPHrine (EPIPEN) 0.3 mg/0.3 mL IJ SOAJ injection Inject 0.3 mg into the muscle as needed (allergic reaction).   Unknown   folic acid  (FOLVITE ) 1 MG tablet Take 1 mg by mouth daily.   02/10/2024   furosemide  (LASIX ) 40 MG tablet Take 40 mg by mouth daily as needed (for fluid retention.).   Past Week   gabapentin  (NEURONTIN ) 300 MG capsule Take 300 mg by mouth 3 (three) times daily.   02/10/2024   levocetirizine (XYZAL) 5 MG tablet Take 5 mg by mouth every evening.   02/10/2024   meclizine  (ANTIVERT ) 25 MG tablet Take 1 tablet (25 mg total) by mouth 3 (three) times daily as needed. 30 tablet 0 02/10/2024   melatonin 3 MG  TABS tablet Take 3 mg by mouth at bedtime.   Past Week   metoprolol  tartrate (LOPRESSOR ) 50 MG tablet Take 50 mg by mouth 3 (three) times daily.   02/10/2024   mirabegron  ER (MYRBETRIQ ) 25 MG TB24 tablet Take 25 mg by mouth daily as needed (for urinary frequency with lasix ).   Unknown   mirtazapine  (REMERON ) 45 MG tablet Take 45 mg by mouth at bedtime.   Past Week   mometasone -formoterol  (DULERA ) 100-5 MCG/ACT AERO Inhale 2 puffs into the lungs 2 (two) times daily as needed for wheezing or shortness of breath.   Past Week   ondansetron  (ZOFRAN -ODT) 4 MG disintegrating tablet Take 4 mg by mouth every 6 (six) hours as needed for nausea or vomiting.   02/10/2024   OVER THE COUNTER MEDICATION Take 4 tablets by mouth daily. Papaya supplement for nausea   02/10/2024   polyethylene glycol (MIRALAX  / GLYCOLAX ) packet Take 17 g by mouth daily as  needed (constipation). Mix in 8 oz liquid and drink   Unknown   potassium chloride  SA (K-DUR,KLOR-CON ) 20 MEQ tablet Take 20 mEq by mouth daily as needed (takes with lasix  for fluid retention).   Past Week   QUEtiapine  Fumarate (SEROQUEL  XR) 150 MG 24 hr tablet Take 150 mg by mouth at bedtime.   Past Week   tiZANidine  (ZANAFLEX ) 4 MG tablet Take 4 mg by mouth every 8 (eight) hours as needed for muscle spasms.   02/10/2024   traMADol  (ULTRAM ) 50 MG tablet Take 50 mg by mouth 3 (three) times daily as needed (pain).   Past Week   VITAMIN D , CHOLECALCIFEROL , PO Take 1 tablet by mouth daily.   02/10/2024   Scheduled:   [START ON 02/12/2024] amLODipine   10 mg Oral Daily   fluticasone  furoate-vilanterol  1 puff Inhalation Daily   metoprolol  tartrate  50 mg Oral TID   QUEtiapine  Fumarate  150 mg Oral QHS    Assessment: 78 YO female presenting with shortness of breath, cough, some chest tightness. EKG without acute ischemia and troponin negative x2. High suspicion for VTE, VQ scan pending. No anticoagulation noted PTA and none given so far during this admission. Pharmacy consulted for one-time dose of Lovenox  while awaiting VQ scan results.  Today, 02/11/24: Hgb 9.7--stable, ~baseline Plts 304--stable D-dimer 2.62--elevated Scr 1.63, CrCl 37 ml/min--baseline unknown  No s/sx of bleeding reported   Goal of Therapy:  Monitor platelets by anticoagulation protocol: Yes   Plan:  Give Lovenox  120mg  (~1mg /kg) SQ x1 now F/u VQ scan results  Monitor CBC at least every 72hrs while on therapeutic Lovenox   Continue to monitor renal function    Lacinda Moats, PharmD Clinical Pharmacist  8/26/20251:41 PM

## 2024-02-11 NOTE — ED Notes (Signed)
 Report given to Christa, Charity fundraiser at [employer].

## 2024-02-11 NOTE — ED Notes (Signed)
Lauren with cl called for transport 

## 2024-02-11 NOTE — ED Notes (Signed)
 Patient reports increasing generalized muscular pain. Patient was offered and accepted her PRN robaxin .

## 2024-02-11 NOTE — ED Provider Notes (Signed)
 [facility] DEPARTMENT AT [facility] Provider Note   CSN: [id number] Arrival date & time: 02/10/24  8143     Patient presents with: Shortness of Breath, Weakness, and Multiple Complaints   Heidi Young is a 78 y.o. female.   Patient with a history of COPD, hypertension, fibromyalgia, obesity, schizophrenia, bipolar disorder presenting with shortness of breath onset this afternoon.  Reports she is breathing better now after getting out of that filthy apartment.  She developed shortness of breath with coughing onset this afternoon.  Associated with some tightness to her chest.  No fever.  Cough is nonproductive.  Has been using marijuana Gummies at home but these are not new.  Not having any chest pain currently.  Feels like she is breathing better currently.  No abdominal pain, vomiting.  No fever.  She states she has been using breathing treatments at home without relief.  Has noticed increased swelling in her legs as well as shortness of breath.  Feels breathing normally now.  The history is provided by the patient.  Shortness of Breath Associated symptoms: chest pain, cough and headaches   Associated symptoms: no fever, no rash and no vomiting   Weakness Associated symptoms: chest pain, cough, headaches and shortness of breath   Associated symptoms: no arthralgias, no dysuria, no fever, no myalgias, no nausea and no vomiting        Prior to Admission medications   Medication Sig Start Date End Date Taking? Authorizing Provider  albuterol  (PROVENTIL  HFA;VENTOLIN  HFA) 108 (90 BASE) MCG/ACT inhaler Inhale 2 puffs into the lungs 5 (five) times daily as needed for wheezing or shortness of breath.    [provider]  albuterol  (PROVENTIL ) (2.5 MG/3ML) 0.083% nebulizer solution Take 2.5 mg by nebulization every 4 (four) hours as needed for wheezing or shortness of breath.    [provider]  amLODipine  (NORVASC ) 5 MG tablet Take 5 mg by mouth  daily.    [provider]  carvedilol  (COREG ) 25 MG tablet Take 25 mg by mouth 2 (two) times daily with a meal.    [provider]  EPINEPHrine (EPIPEN) 0.3 mg/0.3 mL IJ SOAJ injection Inject 0.3 mg into the muscle as needed (allergic reaction).    [provider]  folic acid  (FOLVITE ) 1 MG tablet Take 1 mg by mouth daily.    [provider]  furosemide  (LASIX ) 40 MG tablet Take 40 mg by mouth daily as needed (for fluid retention.).    [provider]  levocetirizine (XYZAL) 5 MG tablet Take 5 mg by mouth every evening.    [provider]  meclizine  (ANTIVERT ) 25 MG tablet Take 1 tablet (25 mg total) by mouth 3 (three) times daily as needed. 06/17/14   Geiple, Joshua, PA-C  methocarbamol  (ROBAXIN ) 500 MG tablet Take 1 tablet (500 mg total) by mouth 2 (two) times daily as needed for muscle spasms. 06/12/14   Joyice Sauer, DO  mirabegron  ER (MYRBETRIQ ) 25 MG TB24 tablet Take 25 mg by mouth daily as needed (for urinary frequency with lasix ).    [provider]  mometasone -formoterol  (DULERA ) 100-5 MCG/ACT AERO Inhale 2 puffs into the lungs 2 (two) times daily.    [provider]  ondansetron  (ZOFRAN -ODT) 4 MG disintegrating tablet Take 4 mg by mouth every 6 (six) hours as needed for nausea or vomiting.    [provider]  OVER THE COUNTER MEDICATION Take 4 tablets by mouth daily. Papaya supplement for nausea    [provider]  polyethylene glycol (MIRALAX  / GLYCOLAX ) packet Take 17 g by mouth daily as needed (constipation). Mix in 8 oz liquid and drink    [provider]  potassium chloride  SA (K-DUR,KLOR-CON ) 20 MEQ tablet Take 20 mEq by mouth daily as needed (takes with lasix  for fluid retention).    [provider]  QUEtiapine  Fumarate (SEROQUEL  XR) 150 MG 24 hr tablet Take 150 mg by mouth at bedtime.    [provider]  tiZANidine  (ZANAFLEX ) 4 MG tablet Take 4 mg by mouth every 8  (eight) hours as needed for muscle spasms.    [provider]  traMADol  (ULTRAM ) 50 MG tablet Take 50 mg by mouth 3 (three) times daily as needed (pain).    [provider]  VITAMIN D , CHOLECALCIFEROL , PO Take 10,000 Units by mouth daily.    [provider]    Allergies: Bee venom, Other, Avelox [moxifloxacin hydrochloride], and Nsaids    Review of Systems  Constitutional:  Negative for activity change, appetite change and fever.  HENT:  Negative for congestion and rhinorrhea.   Respiratory:  Positive for cough, chest tightness and shortness of breath. Negative for apnea.   Cardiovascular:  Positive for chest pain.  Gastrointestinal:  Negative for nausea and vomiting.  Genitourinary:  Negative for dysuria and hematuria.  Musculoskeletal:  Negative for arthralgias and myalgias.  Skin:  Negative for rash.  Neurological:  Positive for weakness and headaches.    all other systems are negative except as noted in the HPI and PMH.   Updated Vital Signs BP (!) 148/58   Pulse 95   Temp 98 F (36.7 C)   Resp 18   Ht 5' 7 (1.702 m)   Wt 113.4 kg   SpO2 99%   BMI 39.16 kg/m   Physical Exam Vitals and nursing note reviewed.  Constitutional:      General: She is not in acute distress.    Appearance: She is well-developed.     Comments: Speaking in Full sentences, no distress  HENT:     Head: Normocephalic and atraumatic.     Mouth/Throat:     Pharynx: No oropharyngeal exudate.  Eyes:     Conjunctiva/sclera: Conjunctivae normal.     Pupils: Pupils are equal, round, and reactive to light.  Neck:     Comments: No meningismus. Cardiovascular:     Rate and Rhythm: Normal rate and regular rhythm.     Heart sounds: Normal heart sounds. No murmur heard. Pulmonary:     Effort: Pulmonary effort is normal. No respiratory distress.     Breath sounds: Normal breath sounds.  Abdominal:     Palpations: Abdomen is soft.     Tenderness: There is no abdominal  tenderness. There is no guarding or rebound.  Musculoskeletal:        General: No tenderness. Normal range of motion.     Cervical back: Normal range of motion and neck supple.     Right lower leg: Edema present.     Left lower leg: Edema present.  Skin:    General: Skin is warm.  Neurological:     Mental Status: She is alert and oriented to person, place, and time.     Cranial Nerves: No cranial nerve deficit.     Motor: No abnormal muscle tone.     Coordination: Coordination normal.     Comments:  5/5 strength throughout. CN 2-12 intact.Equal grip strength.   Psychiatric:  Behavior: Behavior normal.     (all labs ordered are listed, but only abnormal results are displayed) Labs Reviewed  BASIC METABOLIC PANEL WITH GFR - Abnormal; Notable for the following components:      Result Value   CO2 20 (*)    Glucose, Bld 100 (*)    BUN 34 (*)    Creatinine, Ser 1.63 (*)    GFR, Estimated 32 (*)    All other components within normal limits  CBC - Abnormal; Notable for the following components:   RBC 3.59 (*)    Hemoglobin 9.7 (*)    HCT 30.7 (*)    All other components within normal limits  PRO BRAIN NATRIURETIC PEPTIDE - Abnormal; Notable for the following components:   Pro Brain Natriuretic Peptide 401.0 (*)    All other components within normal limits  D-DIMER, QUANTITATIVE - Abnormal; Notable for the following components:   D-Dimer, Quant 2.62 (*)    All other components within normal limits  TROPONIN T, HIGH SENSITIVITY  TROPONIN T, HIGH SENSITIVITY    EKG: EKG Interpretation Date/Time:  Monday February 10 2024 19:17:27 EDT Ventricular Rate:  103 PR Interval:  176 QRS Duration:  72 QT Interval:  334 QTC Calculation: 437 R Axis:   36  Text Interpretation: Sinus tachycardia Cannot rule out Anterior infarct , age undetermined Abnormal ECG When compared with ECG of 17-Jun-2014 11:35, PREVIOUS ECG IS PRESENT Nonspecific T wave abnormality Confirmed by Carita Senior [phone]) on 02/10/2024 11:43:55 PM  Radiology: CT Head Wo Contrast Result Date: 02/11/2024 CLINICAL DATA:  Altered mental status EXAM: CT HEAD WITHOUT CONTRAST TECHNIQUE: Contiguous axial images were obtained from the base of the skull through the vertex without intravenous contrast. RADIATION DOSE REDUCTION: This exam was performed according to the departmental dose-optimization program which includes automated exposure control, adjustment of the mA and/or kV according to patient size and/or use of iterative reconstruction technique. COMPARISON:  06/12/2014 FINDINGS: Brain: No evidence of acute infarction, hemorrhage, hydrocephalus, extra-axial collection or mass lesion/mass effect. Vascular: No hyperdense vessel or unexpected calcification. Skull: Normal. Negative for fracture or focal lesion. Sinuses/Orbits: No acute finding. Other: None. IMPRESSION: No acute intracranial abnormality noted. Electronically Signed   By: Oneil Devonshire M.D.   On: 02/11/2024 02:18   DG Chest 2 View Result Date: 02/10/2024 CLINICAL DATA:  Shortness of breath EXAM: CHEST - 2 VIEW COMPARISON:  Chest radiograph June 12, 2014 FINDINGS: Elevation of right hemidiaphragm, slightly increased to prior. The heart size and mediastinal contours are within normal limits. Aortic knob is calcified. Both lungs are clear. No pleural effusion. Prominent right paratracheal soft tissue possibly due to vascular structures. Increased dorsal kyphosis. IMPRESSION: Elevation of right hemidiaphragm, similar to slightly increased to prior exam which may be due to eventrated hemidiaphragm or segmental lung atelectasis. Recommend chest CT for further assessment if clinically warranted. Electronically Signed   By: Megan  Zare M.D.   On: 02/10/2024 20:31     Procedures   Medications Ordered in the ED  ipratropium-albuterol  (DUONEB) 0.5-2.5 (3) MG/3ML nebulizer solution 3 mL (has no administration in time range)                                     Medical Decision Making Amount and/or Complexity of Data Reviewed Labs: ordered. Decision-making details documented in ED Course. Radiology: ordered and independent interpretation performed. Decision-making details documented in ED Course. ECG/medicine tests:  ordered and independent interpretation performed. Decision-making details documented in ED Course.  Risk Prescription drug management. Decision regarding hospitalization.   Difficulty breathing since this afternoon with generalized weakness.  No increased work of breathing or hypoxia.  Clear lungs.  EKG without acute ischemia.  No hypoxia or increased work of breathing.  Does feel subjectively short of breath.  EKG without acute ischemia.  Chest x-ray as above shows right hemidiaphragm elevation  Troponin negative with low suspicion for ACS.  D-dimer positive at 2.6.  Creatinine slightly worse than baseline.  Will hydrate Stable anemia.   Attempted CT angiogram chest.  Patient reports she cannot receive IV contrast secondary to allergies.  This is not listed in her chart but she states she previously developed hives and shortness of breath with contrast.  On recheck she is in no distress.  No hypoxia or increased work of breathing.  She is agreeable to VQ scan in the morning.  Troponin negative x 2 with low suspicion for ACS.  Denies any chest pain or shortness of breath currently. Declines receiving IV contrast with pretreatment for allergy.  Observation admission discussed with Dr. Lenon     Final diagnoses:  None    ED Discharge Orders     None          Ingris Pasquarella, Garnette, MD 02/11/24 [phone]

## 2024-02-12 ENCOUNTER — Observation Stay (HOSPITAL_BASED_OUTPATIENT_CLINIC_OR_DEPARTMENT_OTHER)

## 2024-02-12 DIAGNOSIS — N1832 Chronic kidney disease, stage 3b: Secondary | ICD-10-CM

## 2024-02-12 DIAGNOSIS — I10 Essential (primary) hypertension: Secondary | ICD-10-CM

## 2024-02-12 DIAGNOSIS — E785 Hyperlipidemia, unspecified: Secondary | ICD-10-CM

## 2024-02-12 DIAGNOSIS — R609 Edema, unspecified: Secondary | ICD-10-CM

## 2024-02-12 DIAGNOSIS — D649 Anemia, unspecified: Secondary | ICD-10-CM

## 2024-02-12 DIAGNOSIS — F319 Bipolar disorder, unspecified: Secondary | ICD-10-CM

## 2024-02-12 DIAGNOSIS — J45909 Unspecified asthma, uncomplicated: Secondary | ICD-10-CM

## 2024-02-12 DIAGNOSIS — R0609 Other forms of dyspnea: Secondary | ICD-10-CM | POA: Diagnosis not present

## 2024-02-12 DIAGNOSIS — I890 Lymphedema, not elsewhere classified: Secondary | ICD-10-CM | POA: Diagnosis not present

## 2024-02-12 DIAGNOSIS — F250 Schizoaffective disorder, bipolar type: Secondary | ICD-10-CM

## 2024-02-12 DIAGNOSIS — I5189 Other ill-defined heart diseases: Secondary | ICD-10-CM

## 2024-02-12 DIAGNOSIS — R079 Chest pain, unspecified: Secondary | ICD-10-CM | POA: Diagnosis not present

## 2024-02-12 LAB — CBC
HCT: 29.5 % — ABNORMAL LOW (ref 36.0–46.0)
Hemoglobin: 8.6 g/dL — ABNORMAL LOW (ref 12.0–15.0)
MCH: 26.7 pg (ref 26.0–34.0)
MCHC: 29.2 g/dL — ABNORMAL LOW (ref 30.0–36.0)
MCV: 91.6 fL (ref 80.0–100.0)
Platelets: 252 K/uL (ref 150–400)
RBC: 3.22 MIL/uL — ABNORMAL LOW (ref 3.87–5.11)
RDW: 14.7 % (ref 11.5–15.5)
WBC: 6.3 K/uL (ref 4.0–10.5)
nRBC: 0 % (ref 0.0–0.2)

## 2024-02-12 LAB — COMPREHENSIVE METABOLIC PANEL WITH GFR
ALT: 44 U/L (ref 0–44)
AST: 42 U/L — ABNORMAL HIGH (ref 15–41)
Albumin: 3.5 g/dL (ref 3.5–5.0)
Alkaline Phosphatase: 71 U/L (ref 38–126)
Anion gap: 14 (ref 5–15)
BUN: 15 mg/dL (ref 8–23)
CO2: 19 mmol/L — ABNORMAL LOW (ref 22–32)
Calcium: 9.1 mg/dL (ref 8.9–10.3)
Chloride: 110 mmol/L (ref 98–111)
Creatinine, Ser: 1.2 mg/dL — ABNORMAL HIGH (ref 0.44–1.00)
GFR, Estimated: 46 mL/min — ABNORMAL LOW (ref >60)
Glucose, Bld: 126 mg/dL — ABNORMAL HIGH (ref 70–99)
Potassium: 4 mmol/L (ref 3.5–5.1)
Sodium: 142 mmol/L (ref 135–145)
Total Bilirubin: 0.5 mg/dL (ref 0.0–1.2)
Total Protein: 5.9 g/dL — ABNORMAL LOW (ref 6.5–8.1)

## 2024-02-12 LAB — ECHOCARDIOGRAM COMPLETE
AR max vel: 2.07 cm2
AV Area VTI: 2.54 cm2
AV Area mean vel: 2.33 cm2
AV Mean grad: 5 mmHg
AV Peak grad: 8.9 mmHg
Ao pk vel: 1.49 m/s
Area-P 1/2: 4.39 cm2
Height: 67 in
S' Lateral: 2.9 cm
Weight: 4000 [oz_av]

## 2024-02-12 LAB — SEDIMENTATION RATE: Sed Rate: 26 mm/h — ABNORMAL HIGH (ref 0–22)

## 2024-02-12 LAB — MAGNESIUM: Magnesium: 2.2 mg/dL (ref 1.7–2.4)

## 2024-02-12 LAB — PHOSPHORUS: Phosphorus: 2.5 mg/dL (ref 2.5–4.6)

## 2024-02-12 MED ORDER — DIPHENHYDRAMINE HCL 25 MG PO CAPS
25.0000 mg | ORAL_CAPSULE | Freq: Once | ORAL | Status: AC | PRN
  Administered 2024-02-12: 25 mg via ORAL
  Filled 2024-02-12: qty 1

## 2024-02-12 MED ORDER — MELATONIN 3 MG PO TABS
3.0000 mg | ORAL_TABLET | Freq: Every day | ORAL | Status: DC
  Administered 2024-02-12: 3 mg via ORAL
  Filled 2024-02-12: qty 1

## 2024-02-12 MED ORDER — PANTOPRAZOLE SODIUM 40 MG PO TBEC
40.0000 mg | DELAYED_RELEASE_TABLET | Freq: Every day | ORAL | Status: DC
  Administered 2024-02-12 – 2024-02-13 (×2): 40 mg via ORAL
  Filled 2024-02-12 (×2): qty 1

## 2024-02-12 MED ORDER — FOLIC ACID 1 MG PO TABS
1.0000 mg | ORAL_TABLET | Freq: Every day | ORAL | Status: DC
  Administered 2024-02-12 – 2024-02-13 (×2): 1 mg via ORAL
  Filled 2024-02-12 (×2): qty 1

## 2024-02-12 MED ORDER — MECLIZINE HCL 25 MG PO TABS: 25.0000 mg | ORAL_TABLET | Freq: Three times a day (TID) | ORAL | Status: DC | PRN

## 2024-02-12 NOTE — Care Management Obs Status (Signed)
 MEDICARE OBSERVATION STATUS NOTIFICATION   Patient Details  Name: Heidi Young MRN: [id number] Date of Birth: 1946-04-05   Medicare Observation Status Notification Given:  Yes    Sheri ONEIDA Sharps, LCSW 02/12/2024, 4:59 PM

## 2024-02-12 NOTE — Progress Notes (Signed)
 Bilateral lower extremity venous duplex has been completed. Preliminary results can be found in CV Proc through chart review.   02/12/24 1:41 PM Cathlyn Collet RVT

## 2024-02-12 NOTE — Evaluation (Addendum)
 Occupational Therapy Evaluation Patient Details Name: Heidi Young MRN: [id number] DOB: 1945-10-10 Today's Date: 02/12/2024   History of Present Illness   78 y.o. female  who presented to the emergency department with complaints of dyspnea, generalized weakness, arthralgias, sudden worsening dyspnea associated with cough and chest tightness, with medical history significant of asthma, psychosis, schizophrenia, depression, bipolar 1 disorder, renal cell carcinoma, Takotsubo cardiomyopathy, left carotid bruit, chronic anemia, osteoarthritis, fibromyalgia, GERD, nephrolithiasis, hyperlipidemia, lumbar herniated disc, hypertension, heart murmur, chest pain please     Clinical Impressions Pt resting in bed comfortably, eager to participate. Pt lives alone, son comes by daily to assist if needed, PLOF mod I. Pt currently overall supervision for safety for ADLs and ambulation with RW, uses standard walker at home. Pt with some inconsistencies in PLOF and home set up, misspeaking about them and correcting herself multiple times. Pt was ambulating around room prior to entry, able to transport objects as needed, with RW. Pt overall appears to be close to baseline, would benefit from [facility] follow up to maximize safety in home setting, will continue to follow acutely to progress as able. Pt would benefit from RW upon return home, may trial Rollator tomorrow to see if Pt would benefit from Rollator vs RW.     If plan is discharge home, recommend the following:   A little help with walking and/or transfers;A little help with bathing/dressing/bathroom;Assistance with cooking/housework;Assist for transportation     Functional Status Assessment   Patient has had a recent decline in their functional status and demonstrates the ability to make significant improvements in function in a reasonable and predictable amount of time.     Equipment Recommendations   Other (comment) (RW)      Recommendations for Other Services         Precautions/Restrictions   Precautions Precautions: Fall Restrictions Weight Bearing Restrictions Per Provider Order: No     Mobility Bed Mobility Overal bed mobility: Modified Independent             General bed mobility comments: mod I from flat bed    Transfers Overall transfer level: Needs assistance Equipment used: Rolling walker (2 wheels) Transfers: Sit to/from Stand, Bed to chair/wheelchair/BSC Sit to Stand: Supervision     Step pivot transfers: Supervision     General transfer comment: supervision for safety with RW      Balance Overall balance assessment: Needs assistance Sitting-balance support: No upper extremity supported, Feet supported Sitting balance-Leahy Scale: Good     Standing balance support: During functional activity Standing balance-Leahy Scale: Fair Standing balance comment: RW for support                           ADL either performed or assessed with clinical judgement   ADL Overall ADL's : At baseline;Needs assistance/impaired                                       General ADL Comments: overall supervision for safety, able to don/doff socks, was ambulating to restroom carrying items while holding RW prior to session, no physical assistance needed ambulating around room with RW, supervision for safety     Vision Baseline Vision/History: 1 Wears glasses Ability to See in Adequate Light: 1 Impaired Patient Visual Report: No change from baseline       Perception  Praxis         Pertinent Vitals/Pain Pain Assessment Pain Assessment: No/denies pain     Extremity/Trunk Assessment             Communication Communication Communication: No apparent difficulties   Cognition Arousal: Alert Behavior During Therapy: WFL for tasks assessed/performed Cognition: No apparent impairments             OT - Cognition Comments: Pt grossly  WFLs but very evident inconsistencies with PLOF and home set up.                 Following commands: Intact       Cueing  General Comments   Cueing Techniques: Verbal cues      Exercises     Shoulder Instructions      Home Living Family/patient expects to be discharged to:: Private residence Living Arrangements: Alone Available Help at Discharge: Family;Available PRN/intermittently Type of Home: Apartment Home Access: Level entry     Home Layout: One level     Bathroom Shower/Tub: Tub/shower unit         Home Equipment: Standard Walker;Cane - single point;Tub bench   Additional Comments: Pt son visits 1X/day to assist with ADLs. Pt wears oxygen as needed. pt reports she has plans to move in with family as soon as possible      Prior Functioning/Environment Prior Level of Function : Needs assist             Mobility Comments: uses standard walker ADLs Comments: needs help from son who visits 1X/day    OT Problem List: Decreased strength;Decreased activity tolerance   OT Treatment/Interventions: Self-care/ADL training;Therapeutic exercise;Energy conservation;DME and/or AE instruction;Therapeutic activities;Patient/family education;Balance training      OT Goals(Current goals can be found in the care plan section)   Acute Rehab OT Goals Patient Stated Goal: to return home OT Goal Formulation: With patient Time For Goal Achievement: 02/26/24 Potential to Achieve Goals: Good   OT Frequency:  Min 2X/week    Co-evaluation              AM-PAC OT 6 Clicks Daily Activity     Outcome Measure Help from another person eating meals?: None Help from another person taking care of personal grooming?: A Little Help from another person toileting, which includes using toliet, bedpan, or urinal?: A Little Help from another person bathing (including washing, rinsing, drying)?: A Little Help from another person to put on and taking off regular upper  body clothing?: A Little Help from another person to put on and taking off regular lower body clothing?: A Little 6 Click Score: 19   End of Session Equipment Utilized During Treatment: Gait belt;Rolling walker (2 wheels) Nurse Communication: Mobility status  Activity Tolerance: Patient tolerated treatment well Patient left: in bed;with call bell/phone within reach;with nursing/sitter in room  OT Visit Diagnosis: Unsteadiness on feet (R26.81);Other abnormalities of gait and mobility (R26.89);Muscle weakness (generalized) (M62.81)                Time: 8478-8452 OT Time Calculation (min): 26 min Charges:  OT General Charges $OT Visit: 1 Visit OT Evaluation $OT Eval Low Complexity: 1 Low OT Treatments $Self Care/Home Management : 8-22 mins  [place], OTR/L   Elouise JONELLE Bott 02/12/2024, 3:50 PM

## 2024-02-12 NOTE — Evaluation (Signed)
 Physical Therapy Evaluation Patient Details Name: Heidi Young MRN: [id number] DOB: 12-28-1945 Today's Date: 02/12/2024  History of Present Illness  78 y.o. female  who presented to the emergency department with complaints of dyspnea, generalized weakness, arthralgias, sudden worsening dyspnea associated with cough and chest tightness. Hx of asthma, psychosis, schizophrenia, depression, bipolar 1 disorder, renal cell carcinoma, Takotsubo cardiomyopathy, left carotid bruit, chronic anemia, osteoarthritis, fibromyalgia, GERD, nephrolithiasis, hyperlipidemia, lumbar herniated disc, hypertension, heart murmur, TKA  Clinical Impression  On eval, pt able to ambulate ~120 feet with a RW. O2 >90% throughout session-pt requested to wear 2L [place] O2-reports she wears O2 with exertion PRN. She tolerated session well. Will recommend HHPT f/u and a RW. Will plan to follow pt during hospital stay.         If plan is discharge home, recommend the following: Assistance with cooking/housework;Assist for transportation;Help with stairs or ramp for entrance   Can travel by private vehicle        Equipment Recommendations Rolling walker (2 wheels)  Recommendations for Other Services       Functional Status Assessment Patient has had a recent decline in their functional status and demonstrates the ability to make significant improvements in function in a reasonable and predictable amount of time.     Precautions / Restrictions Precautions Precautions: Fall Precaution/Restrictions Comments: wears O2 PRN per pt Restrictions Weight Bearing Restrictions Per Provider Order: No      Mobility  Bed Mobility Overal bed mobility: Modified Independent                  Transfers Overall transfer level: Needs assistance Equipment used: Rolling walker (2 wheels) Transfers: Sit to/from Stand Sit to Stand: Supervision           General transfer comment: supervision for safety.     Ambulation/Gait Ambulation/Gait assistance: Contact guard assist Gait Distance (Feet): 120 Feet Assistive device: Rolling walker (2 wheels) Gait Pattern/deviations: Step-through pattern       General Gait Details: Pt requested to wear 2L O2 with ambulation: sats >90%. Pt tolerated distance well.  Stairs            Wheelchair Mobility     Tilt Bed    Modified Rankin (Stroke Patients Only)       Balance Overall balance assessment: Needs assistance         Standing balance support: Bilateral upper extremity supported, Reliant on assistive device for balance, During functional activity Standing balance-Leahy Scale: Fair                               Pertinent Vitals/Pain Pain Assessment Pain Assessment: Faces Faces Pain Scale: Hurts little more Pain Location: generalized Pain Descriptors / Indicators: Sore Pain Intervention(s): Monitored during session    Home Living Family/patient expects to be discharged to:: Private residence Living Arrangements: Alone Available Help at Discharge: Family;Available PRN/intermittently Type of Home: Apartment Home Access: Level entry       Home Layout: One level Home Equipment: Standard Walker;Cane - single point;Tub bench Additional Comments: Pt son visits 1X/day to assist with ADLs. Pt wears oxygen as needed. pt reports she has plans to move in with family as soon as possible    Prior Function Prior Level of Function : Needs assist             Mobility Comments: uses standard walker ADLs Comments: needs help from son who visits 1X/day  Extremity/Trunk Assessment   Upper Extremity Assessment Upper Extremity Assessment: Defer to OT evaluation    Lower Extremity Assessment Lower Extremity Assessment: Generalized weakness    Cervical / Trunk Assessment Cervical / Trunk Assessment: Normal  Communication   Communication Communication: No apparent difficulties    Cognition Arousal:  Alert Behavior During Therapy: WFL for tasks assessed/performed                             Following commands: Intact       Cueing Cueing Techniques: Verbal cues     General Comments      Exercises     Assessment/Plan    PT Assessment Patient needs continued PT services  PT Problem List Decreased strength;Decreased range of motion;Decreased activity tolerance;Decreased mobility;Decreased balance;Decreased knowledge of use of DME;Obesity       PT Treatment Interventions DME instruction;Gait training;Functional mobility training;Therapeutic activities;Therapeutic exercise;Patient/family education;Balance training    PT Goals (Current goals can be found in the Care Plan section)  Acute Rehab PT Goals Patient Stated Goal: to continue to feel better PT Goal Formulation: With patient Time For Goal Achievement: 02/26/24 Potential to Achieve Goals: Good    Frequency Min 3X/week     Co-evaluation               AM-PAC PT 6 Clicks Mobility  Outcome Measure Help needed turning from your back to your side while in a flat bed without using bedrails?: None Help needed moving from lying on your back to sitting on the side of a flat bed without using bedrails?: None Help needed moving to and from a bed to a chair (including a wheelchair)?: None Help needed standing up from a chair using your arms (e.g., wheelchair or bedside chair)?: None Help needed to walk in hospital room?: A Little Help needed climbing 3-5 steps with a railing? : A Little 6 Click Score: 22    End of Session Equipment Utilized During Treatment: Gait belt;Oxygen Activity Tolerance: Patient tolerated treatment well Patient left: in bed;with call bell/phone within reach;with bed alarm set   PT Visit Diagnosis: Muscle weakness (generalized) (M62.81);Difficulty in walking, not elsewhere classified (R26.2)    Time: 8774-8744 PT Time Calculation (min) (ACUTE ONLY): 30 min   Charges:   PT  Evaluation $PT Eval Low Complexity: 1 Low PT Treatments $Gait Training: 8-22 mins PT General Charges $$ ACUTE PT VISIT: 1 Visit            Dannial SQUIBB, PT Acute Rehabilitation  Office: [phone]

## 2024-02-12 NOTE — Progress Notes (Addendum)
 PROGRESS NOTE    Heidi Young  FMW:[id number] DOB: 1945-08-18 DOA: 02/10/2024 PCP: [name], MD   Chief Complaint  Patient presents with   Shortness of Breath   Weakness   Multiple Complaints    Brief Narrative:  Patient 78 year old female history of asthma, psychosis, schizophrenia, bipolar 1 disorder, depression, renal cell carcinoma, Takotsubo's cardiomyopathy, hypertension, hyperlipidemia who presented to the ED with complaints of dyspnea, generalized weakness, arthralgias, cough and chest tightness.  VQ scan done with low probability for PE.  Lower extremity Dopplers ordered as well as 2D echo.  Patient admitted for further evaluation of chest pain.   Assessment & Plan:   Principal Problem:   Chest pain Active Problems:   Hyperlipidemia   Essential hypertension   Asthma   Bipolar 1 disorder (HCC)   Grade I diastolic dysfunction   Class 2 obesity   Lymphedema   CKD stage 3b, GFR 30-44 ml/min (HCC)   Normocytic anemia   Schizoaffective disorder (HCC)  #1 chest pain -Patient presented with some chest tightness, cough, generalized weakness. - Chest pain noted to be described with typical and atypical features. - High-sensitivity troponin negative. - 2D echo done with EF of 55 to 60%,NWMA, grade 1 diastolic dysfunction. - VQ scan done with very low probability of pulmonary embolus. - Lower extremity Dopplers done negative for DVT. - Patient chest pain somewhat reproducible. - Patient with clinical improvement. - Continue Ultram  as needed.  2.?  Lymphedema -Patient noted to spend lots of time sitting or in bed. - Patient noted to have elevated D-dimer. - Lower extremity Dopplers negative for DVT.  3.  Grade 1 diastolic dysfunction -Continue home regimen of beta-blocker and CCB. - 2D echo done with EF of 55 to 60%,NWMA, grade 1 diastolic dysfunction. - Outpatient follow-up.  4.  Hyperlipidemia -Continue statin.  5.  Hypertension -Continue Norvasc  10  mg daily, Lopressor  50 mg 3 times daily.  6.  Asthma -Stable. - Bronchodilators as needed.  7.  Bipolar 1 disorder/schizoaffective disorder -Stable. - Continue home regimen mirtazapine  45 mg nightly. - Continue Seroquel  XR 150 mg nightly.  8.  Normocytic anemia -H&H stable. - No overt bleeding  9.  CKD stage IIIb -Stable.   DVT prophylaxis: Lovenox  Code Status: Full Family Communication: Updated patient.  No family at bedside. Disposition: TBD  Status is: Observation The patient remains OBS appropriate and will d/c before 2 midnights.   Consultants:  None  Procedures:  CT head without contrast 02/11/2024 Chest x-ray 02/10/2024 VQ scan 02/11/2024 Lower extremity Dopplers 02/12/2024 2D echo 02/12/2024   Antimicrobials:  Anti-infectives (From admission, onward)    None         Subjective: Patient sitting up in recliner just finished eating lunch.  States some improvement with chest pain and neck pain.  Denies any ongoing shortness of breath.  No abdominal pain.  Objective: Vitals:   02/11/24 2032 02/12/24 0440 02/12/24 0809 02/12/24 1016  BP: (!) 136/59 (!) 124/57  (!) 147/75  Pulse: 75 72  90  Resp: 19 19    Temp: 99.3 F (37.4 C) 98.1 F (36.7 C)    TempSrc: Oral Oral    SpO2: 98% 100% 99% 100%  Weight:      Height:       No intake or output data in the 24 hours ending 02/12/24 1215 Filed Weights   02/10/24 1913  Weight: 113.4 kg    Examination:  General exam: Appears calm and comfortable  Respiratory system: Clear to  auscultation.  No wheezes, no crackles, no rhonchi.  Fair air movement.  Speaking in full sentences.  Respiratory effort normal. Cardiovascular system: S1 & S2 heard, RRR. No JVD, murmurs, rubs, gallops or clicks. No pedal edema.  Chest pain somewhat reproducible. Gastrointestinal system: Abdomen is nondistended, soft and nontender. No organomegaly or masses felt. Normal bowel sounds heard. Central nervous system: Alert and oriented.  No focal neurological deficits. Extremities: Symmetric 5 x 5 power. Skin: No rashes, lesions or ulcers Psychiatry: Judgement and insight appear normal. Mood & affect appropriate.     Data Reviewed: I have personally reviewed following labs and imaging studies  CBC: Recent Labs  Lab 02/10/24 1918 02/12/24 0556  WBC 7.7 6.3  HGB 9.7* 8.6*  HCT 30.7* 29.5*  MCV 85.5 91.6  PLT 304 252    Basic Metabolic Panel: Recent Labs  Lab 02/10/24 1918 02/12/24 0556  NA 142 142  K 3.8 4.0  CL 108 110  CO2 20* 19*  GLUCOSE 100* 126*  BUN 34* 15  CREATININE 1.63* 1.20*  CALCIUM  9.6 9.1  MG  --  2.2  PHOS  --  2.5    GFR: Estimated Creatinine Clearance: 50.2 mL/min (A) (by C-G formula based on SCr of 1.2 mg/dL (H)).  Liver Function Tests: Recent Labs  Lab 02/12/24 0556  AST 42*  ALT 44  ALKPHOS 71  BILITOT 0.5  PROT 5.9*  ALBUMIN  3.5    CBG: No results for input(s): GLUCAP in the last 168 hours.   No results found for this or any previous visit (from the past 240 hours).       Radiology Studies: NM Pulmonary Perfusion Result Date: 02/11/2024 CLINICAL DATA:  Pulmonary embolus suspected with low to intermediate probability. Positive D-dimer. No previous history of pulmonary embolus. Some leg swelling. History of COPD and asthma. Shortness of breath. EXAM: NUCLEAR MEDICINE PERFUSION LUNG SCAN TECHNIQUE: Perfusion images were obtained in multiple projections after intravenous injection of radiopharmaceutical. Ventilation scans intentionally deferred if perfusion scan and chest x-ray adequate for interpretation during COVID 19 epidemic. RADIOPHARMACEUTICALS:  4.4 mCi Tc-33m MAA IV COMPARISON:  Chest radiograph 02/10/2024 FINDINGS: Normal homogeneous distribution of tracer activity throughout the lungs on perfusion imaging. No photopenic defects are demonstrated. IMPRESSION: Very low probability of pulmonary embolus. Electronically Signed   By: Elsie Gravely M.D.   On:  02/11/2024 15:37   CT Head Wo Contrast Result Date: 02/11/2024 CLINICAL DATA:  Altered mental status EXAM: CT HEAD WITHOUT CONTRAST TECHNIQUE: Contiguous axial images were obtained from the base of the skull through the vertex without intravenous contrast. RADIATION DOSE REDUCTION: This exam was performed according to the departmental dose-optimization program which includes automated exposure control, adjustment of the mA and/or kV according to patient size and/or use of iterative reconstruction technique. COMPARISON:  06/12/2014 FINDINGS: Brain: No evidence of acute infarction, hemorrhage, hydrocephalus, extra-axial collection or mass lesion/mass effect. Vascular: No hyperdense vessel or unexpected calcification. Skull: Normal. Negative for fracture or focal lesion. Sinuses/Orbits: No acute finding. Other: None. IMPRESSION: No acute intracranial abnormality noted. Electronically Signed   By: Oneil Devonshire M.D.   On: 02/11/2024 02:18   DG Chest 2 View Result Date: 02/10/2024 CLINICAL DATA:  Shortness of breath EXAM: CHEST - 2 VIEW COMPARISON:  Chest radiograph June 12, 2014 FINDINGS: Elevation of right hemidiaphragm, slightly increased to prior. The heart size and mediastinal contours are within normal limits. Aortic knob is calcified. Both lungs are clear. No pleural effusion. Prominent right paratracheal soft tissue possibly  due to vascular structures. Increased dorsal kyphosis. IMPRESSION: Elevation of right hemidiaphragm, similar to slightly increased to prior exam which may be due to eventrated hemidiaphragm or segmental lung atelectasis. Recommend chest CT for further assessment if clinically warranted. Electronically Signed   By: Megan  Zare M.D.   On: 02/10/2024 20:31        Scheduled Meds:  amLODipine   10 mg Oral Daily   atorvastatin   20 mg Oral Daily   enoxaparin  (LOVENOX ) injection  40 mg Subcutaneous Q24H   fluticasone  furoate-vilanterol  1 puff Inhalation Daily   folic acid   1 mg  Oral Daily   gabapentin   300 mg Oral TID   melatonin  3 mg Oral QHS   metoprolol  tartrate  50 mg Oral TID   mirtazapine   45 mg Oral QHS   QUEtiapine  Fumarate  150 mg Oral QHS   Continuous Infusions:   LOS: 0 days    Time spent: 40 minutes    Toribio Hummer, MD [facility]   To contact the attending provider between 7A-7P or the covering provider during after hours 7P-7A, please log into the web site [email or web address] and access using universal  password for that web site. If you do not have the password, please call the hospital operator.  02/12/2024, 12:15 PM

## 2024-02-12 NOTE — Plan of Care (Signed)

## 2024-02-13 DIAGNOSIS — I890 Lymphedema, not elsewhere classified: Secondary | ICD-10-CM | POA: Diagnosis not present

## 2024-02-13 DIAGNOSIS — D649 Anemia, unspecified: Secondary | ICD-10-CM | POA: Diagnosis not present

## 2024-02-13 DIAGNOSIS — R079 Chest pain, unspecified: Secondary | ICD-10-CM | POA: Diagnosis not present

## 2024-02-13 DIAGNOSIS — I10 Essential (primary) hypertension: Secondary | ICD-10-CM | POA: Diagnosis not present

## 2024-02-13 LAB — CBC
HCT: 30.7 % — ABNORMAL LOW (ref 36.0–46.0)
Hemoglobin: 8.9 g/dL — ABNORMAL LOW (ref 12.0–15.0)
MCH: 26.6 pg (ref 26.0–34.0)
MCHC: 29 g/dL — ABNORMAL LOW (ref 30.0–36.0)
MCV: 91.9 fL (ref 80.0–100.0)
Platelets: 291 K/uL (ref 150–400)
RBC: 3.34 MIL/uL — ABNORMAL LOW (ref 3.87–5.11)
RDW: 14.6 % (ref 11.5–15.5)
WBC: 6.5 K/uL (ref 4.0–10.5)
nRBC: 0 % (ref 0.0–0.2)

## 2024-02-13 LAB — BASIC METABOLIC PANEL WITH GFR
Anion gap: 14 (ref 5–15)
BUN: 17 mg/dL (ref 8–23)
CO2: 19 mmol/L — ABNORMAL LOW (ref 22–32)
Calcium: 9.4 mg/dL (ref 8.9–10.3)
Chloride: 109 mmol/L (ref 98–111)
Creatinine, Ser: 1.42 mg/dL — ABNORMAL HIGH (ref 0.44–1.00)
GFR, Estimated: 38 mL/min — ABNORMAL LOW (ref >60)
Glucose, Bld: 85 mg/dL (ref 70–99)
Potassium: 4.6 mmol/L (ref 3.5–5.1)
Sodium: 142 mmol/L (ref 135–145)

## 2024-02-13 MED ORDER — ACETAMINOPHEN 325 MG PO TABS: 650.0000 mg | ORAL_TABLET | Freq: Four times a day (QID) | ORAL | Status: DC | PRN

## 2024-02-13 MED ORDER — SODIUM BICARBONATE 650 MG PO TABS
650.0000 mg | ORAL_TABLET | Freq: Two times a day (BID) | ORAL | Status: DC
  Administered 2024-02-13: 650 mg via ORAL
  Filled 2024-02-13: qty 1

## 2024-02-13 MED ORDER — MIRABEGRON ER 25 MG PO TB24: 25.0000 mg | ORAL_TABLET | Freq: Every day | ORAL | 0 refills | Status: AC | PRN

## 2024-02-13 MED ORDER — PANTOPRAZOLE SODIUM 40 MG PO TBEC: 40.0000 mg | DELAYED_RELEASE_TABLET | Freq: Every day | ORAL | 1 refills | Status: AC

## 2024-02-13 MED ORDER — SODIUM BICARBONATE 650 MG PO TABS: 650.0000 mg | ORAL_TABLET | Freq: Two times a day (BID) | ORAL | 0 refills | Status: AC

## 2024-02-13 NOTE — Discharge Summary (Signed)
 Physician Discharge Summary  Heidi Young FMW:[id number] DOB: [birth year] DOA: 02/10/2024  PCP: [name], MD  Admit date: 02/10/2024 Discharge date: 02/13/2024  Time spent: 60 minutes  Recommendations for Outpatient Follow-up:  Follow-up with [name], MD in 2 weeks.  On follow-up patient will need a basic metabolic profile done to follow-up on electrolytes and renal function. Chest pain will need to be reassessed at that time.   Discharge Diagnoses:  Principal Problem:   Chest pain Active Problems:   Hyperlipidemia   Essential hypertension   Asthma   Bipolar 1 disorder (HCC)   Grade I diastolic dysfunction   Class 2 obesity   Lymphedema   CKD stage 3b, GFR 30-44 ml/min (HCC)   Normocytic anemia   Schizoaffective disorder (HCC)   Discharge Condition: Stable and improved.  Diet recommendation: Heart healthy  Filed Weights   02/10/24 1913  Weight: 113.4 kg    History of present illness:  HPI per Dr. Celinda Comer Young is a 78 y.o. female with medical history significant of asthma, psychosis, schizophrenia, depression, bipolar 1 disorder, renal cell carcinoma, Takotsubo cardiomyopathy, left carotid bruit, chronic anemia, osteoarthritis, fibromyalgia, GERD, nephrolithiasis, hyperlipidemia, lumbar herniated disc, hypertension, heart murmur, chest pain please who presented to the emergency department with complaints of dyspnea, generalized weakness, arthralgias who presented to the emergency department with complaints of having sudden worsening dyspnea associated with cough and chest tightness.  Cough is nonproductive.  No  palpitations, diaphoresis, PND, orthopnea, but occasionally develops pitting edema of the lower extremities.  She denied fever, chills, rhinorrhea, sore throat, wheezing or hemoptysis.  No abdominal pain, nausea, emesis, diarrhea, constipation, melena or hematochezia.  No flank pain, dysuria, frequency or hematuria.  No polyuria, polydipsia,  polyphagia or blurred vision.  The patient spends a lot of time sitting or in bed.   Lab work: CBC showed a white count of 7.7, hemoglobin 9.7 g/dL and platelets 695.  D-dimer was 2.62.  Troponin x 2 normal.  proBNP 401.0 pg/mL.  BMP showed a CO2 of 20 mmol/L with a normal anion gap, the rest of the electrolytes were normal.  Glucose 100, BUN 34 and creatinine 1.64 mg/dL.   Imaging: 2 view chest radiograph showing elevation of the right hemidiaphragm, similar to slightly increased when compared to prior exam.  Recommend chest CT for further assessment if clinically warranted.  CT head without contrast with no acute intracranial normality.  VQ scan was very low probability for PE.   ED course: Initial vital signs were temperature 98.2 F, pulse 101, respiration 19, BP 157/98 mmHg and O2 sat 98% on room air.  The patient received LR bolus 1000 mL.   Hospital Course:  #1 chest pain -Patient presented with some chest tightness, cough, generalized weakness. - Chest pain noted to be described with typical and atypical features. - High-sensitivity troponin negative. - 2D echo done with EF of 55 to 60%,NWMA, grade 1 diastolic dysfunction. - VQ scan done with very low probability of pulmonary embolus. - Lower extremity Dopplers done negative for DVT. - Patient chest pain somewhat reproducible. -Patient maintained on PPI daily. -Patient also placed on home regimen of Ultram  as needed. -Patient improved clinically and patient will be discharged in stable and improved condition.   2.?  Lymphedema -Patient noted to spend lots of time sitting or in bed. - Patient noted to have elevated D-dimer. - Lower extremity Dopplers negative for DVT.   3.  Grade 1 diastolic dysfunction - Patient maintained on home regimen  of beta-blocker and CCB. - 2D echo done with EF of 55 to 60%,NWMA, grade 1 diastolic dysfunction. - Outpatient follow-up.   4.  Hyperlipidemia - Patient maintained on home regimen statin.     5.  Hypertension - Patient maintained on home regimen Norvasc  10 mg daily, Lopressor  50 mg 3 times daily.   6.  Asthma -Stable. - Bronchodilators as needed.   7.  Bipolar 1 disorder/schizoaffective disorder -Remained stable during the hospitalization. - Patient placed back on home regimen mirtazapine  45 mg nightly and Seroquel  XR 150 mg nightly. - Outpatient follow-up with PCP.   8.  Normocytic anemia -H&H stable. - No overt bleeding   9.  CKD stage IIIb - Remained stable during the hospitalization.  - Outpatient follow-up.    Procedures: CT head without contrast 02/11/2024 Chest x-ray 02/10/2024 VQ scan 02/11/2024 Lower extremity Dopplers 02/12/2024 2D echo 02/12/2024  Consultations: None  Discharge Exam: Vitals:   02/13/24 0759 02/13/24 1327  BP:  (!) 144/70  Pulse:  78  Resp:  18  Temp:  98.1 F (36.7 C)  SpO2: 100% 98%    General: NAD. Cardiovascular: Regular rate and rhythm no murmurs rubs or gallops.  No JVD.  No lower extremity edema. Respiratory: Clear to auscultation bilaterally.  No wheezes, no crackles, no rhonchi.  Fair air movement.  Speaking in full sentences.  Discharge Instructions   Discharge Instructions     Diet - low sodium heart healthy   Complete by: As directed    Increase activity slowly   Complete by: As directed       Allergies as of 02/13/2024       Reactions   Bee Venom Anaphylaxis   Other Anaphylaxis   Red ant's, roaches    Avelox [moxifloxacin Hydrochloride] Rash, Other (See Comments)   Fever / Upset Stomach   Nsaids Other (See Comments)   Due to stomach ulcers   Ciprofloxacin Other (See Comments)   Upset stomach        Medication List     TAKE these medications    acetaminophen  325 MG tablet Commonly known as: TYLENOL  Take 2 tablets (650 mg total) by mouth every 6 (six) hours as needed for mild pain (pain score 1-3) or fever (or Fever >/= 101).   albuterol  108 (90 Base) MCG/ACT inhaler Commonly known as:  VENTOLIN  HFA Inhale 2 puffs into the lungs 5 (five) times daily as needed for wheezing or shortness of breath.   albuterol  (2.5 MG/3ML) 0.083% nebulizer solution Commonly known as: PROVENTIL  Take 2.5 mg by nebulization every 4 (four) hours as needed for wheezing or shortness of breath.   amLODipine  10 MG tablet Commonly known as: NORVASC  Take 10 mg by mouth daily.   atorvastatin  20 MG tablet Commonly known as: LIPITOR Take 20 mg by mouth at bedtime.   Constulose  10 GM/15ML solution Generic drug: lactulose  Take 20 g by mouth daily as needed for moderate constipation.   EpiPen 0.3 mg/0.3 mL Soaj injection Generic drug: EPINEPHrine Inject 0.3 mg into the muscle as needed (allergic reaction).   folic acid  1 MG tablet Commonly known as: FOLVITE  Take 1 mg by mouth daily.   furosemide  40 MG tablet Commonly known as: LASIX  Take 40 mg by mouth daily as needed (for fluid retention.).   gabapentin  300 MG capsule Commonly known as: NEURONTIN  Take 300 mg by mouth 3 (three) times daily.   KAOPECTATE PO Take 1 Dose by mouth 2 (two) times daily as needed (Diarrhea).  levocetirizine 5 MG tablet Commonly known as: XYZAL Take 5 mg by mouth every evening.   meclizine  25 MG tablet Commonly known as: ANTIVERT  Take 1 tablet (25 mg total) by mouth 3 (three) times daily as needed.   melatonin 3 MG Tabs tablet Take 3 mg by mouth at bedtime.   metoprolol  tartrate 50 MG tablet Commonly known as: LOPRESSOR  Take 50 mg by mouth 3 (three) times daily.   mirabegron  ER 25 MG Tb24 tablet Commonly known as: Myrbetriq  Take 1 tablet (25 mg total) by mouth daily as needed (for urinary frequency with lasix ).   mirtazapine  45 MG tablet Commonly known as: REMERON  Take 45 mg by mouth at bedtime.   mometasone -formoterol  100-5 MCG/ACT Aero Commonly known as: DULERA  Inhale 2 puffs into the lungs 2 (two) times daily as needed for wheezing or shortness of breath.   ondansetron  4 MG disintegrating  tablet Commonly known as: ZOFRAN -ODT Take 4 mg by mouth every 6 (six) hours as needed for nausea or vomiting.   OVER THE COUNTER MEDICATION Take 4 tablets by mouth daily. Papaya supplement for nausea   pantoprazole  40 MG tablet Commonly known as: PROTONIX  Take 1 tablet (40 mg total) by mouth daily at 6 (six) AM. Start taking on: February 14, 2024   polyethylene glycol 17 g packet Commonly known as: MIRALAX  / GLYCOLAX  Take 17 g by mouth daily as needed (constipation). Mix in 8 oz liquid and drink   potassium chloride  SA 20 MEQ tablet Commonly known as: KLOR-CON  M Take 20 mEq by mouth daily as needed (takes with lasix  for fluid retention).   SEROquel  XR 150 MG 24 hr tablet Generic drug: QUEtiapine  Fumarate Take 150 mg by mouth at bedtime.   sodium bicarbonate  650 MG tablet Take 1 tablet (650 mg total) by mouth 2 (two) times daily for 3 days.   tiZANidine  4 MG tablet Commonly known as: ZANAFLEX  Take 4 mg by mouth every 8 (eight) hours as needed for muscle spasms.   traMADol  50 MG tablet Commonly known as: ULTRAM  Take 50 mg by mouth 3 (three) times daily as needed (pain).   VITAMIN D  (CHOLECALCIFEROL ) PO Take 1 tablet by mouth daily.       Allergies  Allergen Reactions   Bee Venom Anaphylaxis   Other Anaphylaxis    Red ant's, roaches    Avelox [Moxifloxacin Hydrochloride] Rash and Other (See Comments)    Fever / Upset Stomach   Nsaids Other (See Comments)    Due to stomach ulcers   Ciprofloxacin Other (See Comments)    Upset stomach    Follow-up Information     [name], MD. Schedule an appointment as soon as possible for a visit in 2 week(s).   Specialty: Internal Medicine Contact information: [address] [place] [address] [phone]                  The results of significant diagnostics from this hospitalization (including imaging, microbiology, ancillary and laboratory) are listed below for reference.    Significant Diagnostic  Studies: ECHOCARDIOGRAM COMPLETE Result Date: 02/12/2024    ECHOCARDIOGRAM REPORT   Patient Name:   Lilu Pitcher Date of Exam: 02/11/2024 Medical Rec #:  [id number]          Height:       67.0 in Accession #:    [id number]         Weight:       250.0 lb Date of Birth:  Mar 01, 1946  BSA:          2.223 m Patient Age:    78 years           BP:           124/57 mmHg Patient Gender: F                  HR:           81 bpm. Exam Location:  Inpatient Procedure: 2D Echo (Both Spectral and Color Flow Doppler were utilized during            procedure). Indications:    Dyspnea R06.00  History:        Patient has no prior history of Echocardiogram examinations.                 Risk Factors:Hypertension.  Sonographer:    Jayson Gaskins Referring Phys: [id number] DAVID MANUEL ORTIZ IMPRESSIONS  1. Left ventricular ejection fraction, by estimation, is 55 to 60%. The left ventricle has normal function. The left ventricle has no regional wall motion abnormalities. Left ventricular diastolic parameters are consistent with Grade I diastolic dysfunction (impaired relaxation).  2. Right ventricular systolic function is normal. The right ventricular size is normal. IVC is not visualized. RVSP is atleast .  3. The mitral valve is normal in structure. Trivial mitral valve regurgitation.  4. The aortic valve is normal in structure. Aortic valve regurgitation is not visualized. FINDINGS  Left Ventricle: Left ventricular ejection fraction, by estimation, is 55 to 60%. The left ventricle has normal function. The left ventricle has no regional wall motion abnormalities. The left ventricular internal cavity size was normal in size. There is  no left ventricular hypertrophy. Left ventricular diastolic parameters are consistent with Grade I diastolic dysfunction (impaired relaxation). Right Ventricle: The right ventricular size is normal. No increase in right ventricular wall thickness. Right ventricular systolic function is normal.  Left Atrium: Left atrial size was normal in size. Right Atrium: Right atrial size was normal in size. Pericardium: There is no evidence of pericardial effusion. Presence of epicardial fat layer. Mitral Valve: The mitral valve is normal in structure. Trivial mitral valve regurgitation. Tricuspid Valve: The tricuspid valve is grossly normal. Tricuspid valve regurgitation is mild. Aortic Valve: The aortic valve is normal in structure. Aortic valve regurgitation is not visualized. Aortic valve mean gradient measures 5.0 mmHg. Aortic valve peak gradient measures 8.9 mmHg. Aortic valve area, by VTI measures 2.54 cm. Pulmonic Valve: The pulmonic valve was grossly normal. Pulmonic valve regurgitation is trivial. Aorta: The aortic root and ascending aorta are structurally normal, with no evidence of dilitation. IAS/Shunts: No atrial level shunt detected by color flow Doppler.  LEFT VENTRICLE PLAX 2D LVIDd:         4.80 cm   Diastology LVIDs:         2.90 cm   LV e' medial:    6.85 cm/s LV PW:         0.80 cm   LV E/e' medial:  17.8 LV IVS:        0.90 cm   LV e' lateral:   6.53 cm/s LVOT diam:     1.80 cm   LV E/e' lateral: 18.7 LV SV:         72 LV SV Index:   32 LVOT Area:     2.54 cm  RIGHT VENTRICLE RV S prime:     18.50 cm/s TAPSE (M-mode): 3.1 cm LEFT ATRIUM  Index        RIGHT ATRIUM           Index LA Vol (A2C):   44.1 ml 19.83 ml/m  RA Area:     14.80 cm LA Vol (A4C):   66.2 ml 29.77 ml/m  RA Volume:   33.80 ml  15.20 ml/m LA Biplane Vol: 55.3 ml 24.87 ml/m  AORTIC VALVE AV Area (Vmax):    2.07 cm AV Area (Vmean):   2.33 cm AV Area (VTI):     2.54 cm AV Vmax:           149.00 cm/s AV Vmean:          99.800 cm/s AV VTI:            0.283 m AV Peak Grad:      8.9 mmHg AV Mean Grad:      5.0 mmHg LVOT Vmax:         121.00 cm/s LVOT Vmean:        91.400 cm/s LVOT VTI:          0.282 m LVOT/AV VTI ratio: 1.00  AORTA Ao Root diam: 2.50 cm Ao Asc diam:  2.80 cm MITRAL VALVE                TRICUSPID  VALVE MV Area (PHT): 4.39 cm     TR Peak grad:   39.7 mmHg MV Decel Time: 173 msec     TR Vmax:        315.00 cm/s MV E velocity: 122.00 cm/s MV A velocity: 115.00 cm/s  SHUNTS MV E/A ratio:  1.06         Systemic VTI:  0.28 m                             Systemic Diam: 1.80 cm Aditya Sabharwal Electronically signed by Ria Commander Signature Date/Time: 02/12/2024/7:33:25 PM    Final    VAS US  LOWER EXTREMITY VENOUS (DVT) Result Date: 02/12/2024  Lower Venous DVT Study Patient Name:  Joda Mayeux  Date of Exam:   02/12/2024 Medical Rec #: [id number]           Accession #:    [id number] Date of Birth: 25-Mar-1946            Patient Gender: F Patient Age:   14 years Exam Location:  [facility] Procedure:      VAS US  LOWER EXTREMITY VENOUS (DVT) Referring Phys: DAVID ORTIZ --------------------------------------------------------------------------------  Indications: Edema.  Risk Factors: None identified. Limitations: Body habitus and poor ultrasound/tissue interface. Comparison Study: No prior studies. Performing Technologist: Cordella Collet RVT  Examination Guidelines: A complete evaluation includes B-mode imaging, spectral Doppler, color Doppler, and power Doppler as needed of all accessible portions of each vessel. Bilateral testing is considered an integral part of a complete examination. Limited examinations for reoccurring indications may be performed as noted. The reflux portion of the exam is performed with the patient in reverse Trendelenburg.  +---------+---------------+---------+-----------+----------+-------------------+ RIGHT    CompressibilityPhasicitySpontaneityPropertiesThrombus Aging      +---------+---------------+---------+-----------+----------+-------------------+ CFV      Full           Yes      Yes                                      +---------+---------------+---------+-----------+----------+-------------------+ SFJ  Full                                                              +---------+---------------+---------+-----------+----------+-------------------+ FV Prox  Full                                                             +---------+---------------+---------+-----------+----------+-------------------+ FV Mid   Full                                                             +---------+---------------+---------+-----------+----------+-------------------+ FV DistalFull                                                             +---------+---------------+---------+-----------+----------+-------------------+ PFV      Full                                                             +---------+---------------+---------+-----------+----------+-------------------+ POP      Full           Yes      Yes                                      +---------+---------------+---------+-----------+----------+-------------------+ PTV      Full                                                             +---------+---------------+---------+-----------+----------+-------------------+ PERO                                                  Not well visualized +---------+---------------+---------+-----------+----------+-------------------+   +---------+---------------+---------+-----------+----------+-------------------+ LEFT     CompressibilityPhasicitySpontaneityPropertiesThrombus Aging      +---------+---------------+---------+-----------+----------+-------------------+ CFV      Full           Yes      Yes                                      +---------+---------------+---------+-----------+----------+-------------------+ SFJ      Full                                                             +---------+---------------+---------+-----------+----------+-------------------+  FV Prox  Full                                                              +---------+---------------+---------+-----------+----------+-------------------+ FV Mid   Full                                                             +---------+---------------+---------+-----------+----------+-------------------+ FV DistalFull                                                             +---------+---------------+---------+-----------+----------+-------------------+ PFV      Full                                                             +---------+---------------+---------+-----------+----------+-------------------+ POP      Full           Yes      Yes                                      +---------+---------------+---------+-----------+----------+-------------------+ PTV      Full                                                             +---------+---------------+---------+-----------+----------+-------------------+ PERO                                                  Not well visualized +---------+---------------+---------+-----------+----------+-------------------+     Summary: RIGHT: - There is no evidence of deep vein thrombosis in the lower extremity. However, portions of this examination were limited- see technologist comments above.  - No cystic structure found in the popliteal fossa.  LEFT: - There is no evidence of deep vein thrombosis in the lower extremity. However, portions of this examination were limited- see technologist comments above.  - No cystic structure found in the popliteal fossa.  *See table(s) above for measurements and observations. Electronically signed by Lonni Gaskins MD on 02/12/2024 at 6:05:29 PM.    Final    NM Pulmonary Perfusion Result Date: 02/11/2024 CLINICAL DATA:  Pulmonary embolus suspected with low to intermediate probability. Positive D-dimer. No previous history of pulmonary embolus. Some leg swelling. History of COPD and asthma. Shortness of breath. EXAM: NUCLEAR MEDICINE PERFUSION LUNG SCAN  TECHNIQUE: Perfusion images were obtained in multiple projections  after intravenous injection of radiopharmaceutical. Ventilation scans intentionally deferred if perfusion scan and chest x-ray adequate for interpretation during COVID 19 epidemic. RADIOPHARMACEUTICALS:  4.4 mCi Tc-69m MAA IV COMPARISON:  Chest radiograph 02/10/2024 FINDINGS: Normal homogeneous distribution of tracer activity throughout the lungs on perfusion imaging. No photopenic defects are demonstrated. IMPRESSION: Very low probability of pulmonary embolus. Electronically Signed   By: Elsie Gravely M.D.   On: 02/11/2024 15:37   CT Head Wo Contrast Result Date: 02/11/2024 CLINICAL DATA:  Altered mental status EXAM: CT HEAD WITHOUT CONTRAST TECHNIQUE: Contiguous axial images were obtained from the base of the skull through the vertex without intravenous contrast. RADIATION DOSE REDUCTION: This exam was performed according to the departmental dose-optimization program which includes automated exposure control, adjustment of the mA and/or kV according to patient size and/or use of iterative reconstruction technique. COMPARISON:  06/12/2014 FINDINGS: Brain: No evidence of acute infarction, hemorrhage, hydrocephalus, extra-axial collection or mass lesion/mass effect. Vascular: No hyperdense vessel or unexpected calcification. Skull: Normal. Negative for fracture or focal lesion. Sinuses/Orbits: No acute finding. Other: None. IMPRESSION: No acute intracranial abnormality noted. Electronically Signed   By: Oneil Devonshire M.D.   On: 02/11/2024 02:18   DG Chest 2 View Result Date: 02/10/2024 CLINICAL DATA:  Shortness of breath EXAM: CHEST - 2 VIEW COMPARISON:  Chest radiograph June 12, 2014 FINDINGS: Elevation of right hemidiaphragm, slightly increased to prior. The heart size and mediastinal contours are within normal limits. Aortic knob is calcified. Both lungs are clear. No pleural effusion. Prominent right paratracheal soft tissue possibly  due to vascular structures. Increased dorsal kyphosis. IMPRESSION: Elevation of right hemidiaphragm, similar to slightly increased to prior exam which may be due to eventrated hemidiaphragm or segmental lung atelectasis. Recommend chest CT for further assessment if clinically warranted. Electronically Signed   By: Megan  Zare M.D.   On: 02/10/2024 20:31    Microbiology: No results found for this or any previous visit (from the past 240 hours).   Labs: Basic Metabolic Panel: Recent Labs  Lab 02/10/24 1918 02/12/24 0556 02/13/24 0530  NA 142 142 142  K 3.8 4.0 4.6  CL 108 110 109  CO2 20* 19* 19*  GLUCOSE 100* 126* 85  BUN 34* 15 17  CREATININE 1.63* 1.20* 1.42*  CALCIUM  9.6 9.1 9.4  MG  --  2.2  --   PHOS  --  2.5  --    Liver Function Tests: Recent Labs  Lab 02/12/24 0556  AST 42*  ALT 44  ALKPHOS 71  BILITOT 0.5  PROT 5.9*  ALBUMIN  3.5   No results for input(s): LIPASE, AMYLASE in the last 168 hours. No results for input(s): AMMONIA in the last 168 hours. CBC: Recent Labs  Lab 02/10/24 1918 02/12/24 0556 02/13/24 0530  WBC 7.7 6.3 6.5  HGB 9.7* 8.6* 8.9*  HCT 30.7* 29.5* 30.7*  MCV 85.5 91.6 91.9  PLT 304 252 291   Cardiac Enzymes: No results for input(s): CKTOTAL, CKMB, CKMBINDEX, TROPONINI in the last 168 hours. BNP: BNP (last 3 results) No results for input(s): BNP in the last 8760 hours.  ProBNP (last 3 results) Recent Labs    02/11/24 0000  PROBNP 401.0*    CBG: No results for input(s): GLUCAP in the last 168 hours.     Signed:  Toribio Hummer MD.  [facility] 02/13/2024, 3:54 PM

## 2024-02-13 NOTE — TOC Transition Note (Signed)
 Transition of Care [facility]) - Discharge Note   Patient Details  Name: Heidi Young MRN: [id number] Date of Birth: 1945-10-26  Transition of Care [facility]) CM/SW Contact:  Sheri ONEIDA Sharps, LCSW Phone Number: 02/13/2024, 4:17 PM   Clinical Narrative:    Pt medically ready to dc home. Pt recommended for HH and RW. HHPT setup w/ [facility]. RW ordered to be delivered to room via [employer].   Final next level of care: Home w Home Health Services Barriers to Discharge: Barriers Resolved   Patient Goals and CMS Choice Patient states their goals for this hospitalization and ongoing recovery are:: return home   Choice offered to / list presented to : NA      Discharge Placement                    Patient and family notified of of transfer: 02/13/24  Discharge Plan and Services Additional resources added to the After Visit Summary for                  DME Arranged: Walker rolling DME Agency: [employer] Date DME Agency Contacted: 02/13/24 Time DME Agency Contacted: [phone] Representative spoke with at DME Agency: [place] HH Arranged: PT, OT HH Agency: [facility] Date [facility] Agency Contacted: 02/13/24 Time HH Agency Contacted: 1617 Representative spoke with at [facility] Agency: Cind[place]  Social Drivers of Health (SDOH) Interventions SDOH Screenings   Food Insecurity: Food Insecurity Present (02/11/2024)  Housing: Low Risk  (02/11/2024)  Transportation Needs: No Transportation Needs (02/11/2024)  Utilities: Not At Risk (02/11/2024)  Social Connections: Unknown (02/11/2024)  Tobacco Use: Unknown (02/10/2024)     Readmission Risk Interventions     No data to display

## 2024-02-13 NOTE — Plan of Care (Signed)

## 2024-02-16 ENCOUNTER — Ambulatory Visit (HOSPITAL_COMMUNITY)
Admission: EM | Admit: 2024-02-16 | Discharge: 2024-02-16 | Disposition: A | Discharge status: Home / Self Care | Attending: Psychiatry | Admitting: Psychiatry

## 2024-02-16 DIAGNOSIS — N189 Chronic kidney disease, unspecified: Secondary | ICD-10-CM | POA: Insufficient documentation

## 2024-02-16 DIAGNOSIS — I129 Hypertensive chronic kidney disease with stage 1 through stage 4 chronic kidney disease, or unspecified chronic kidney disease: Secondary | ICD-10-CM | POA: Insufficient documentation

## 2024-02-16 DIAGNOSIS — E669 Obesity, unspecified: Secondary | ICD-10-CM | POA: Insufficient documentation

## 2024-02-16 DIAGNOSIS — M7989 Other specified soft tissue disorders: Secondary | ICD-10-CM | POA: Insufficient documentation

## 2024-02-16 DIAGNOSIS — Z85528 Personal history of other malignant neoplasm of kidney: Secondary | ICD-10-CM | POA: Insufficient documentation

## 2024-02-16 DIAGNOSIS — F308 Other manic episodes: Secondary | ICD-10-CM | POA: Insufficient documentation

## 2024-02-16 DIAGNOSIS — Z79899 Other long term (current) drug therapy: Secondary | ICD-10-CM | POA: Insufficient documentation

## 2024-02-16 DIAGNOSIS — R350 Frequency of micturition: Secondary | ICD-10-CM | POA: Insufficient documentation

## 2024-02-16 DIAGNOSIS — Z87440 Personal history of urinary (tract) infections: Secondary | ICD-10-CM | POA: Insufficient documentation

## 2024-02-16 DIAGNOSIS — Z8659 Personal history of other mental and behavioral disorders: Secondary | ICD-10-CM | POA: Insufficient documentation

## 2024-02-16 DIAGNOSIS — I429 Cardiomyopathy, unspecified: Secondary | ICD-10-CM | POA: Insufficient documentation

## 2024-02-16 DIAGNOSIS — M549 Dorsalgia, unspecified: Secondary | ICD-10-CM | POA: Insufficient documentation

## 2024-02-16 DIAGNOSIS — R3915 Urgency of urination: Secondary | ICD-10-CM | POA: Insufficient documentation

## 2024-02-16 DIAGNOSIS — R4182 Altered mental status, unspecified: Secondary | ICD-10-CM | POA: Insufficient documentation

## 2024-02-16 DIAGNOSIS — R451 Restlessness and agitation: Secondary | ICD-10-CM | POA: Insufficient documentation

## 2024-02-16 DIAGNOSIS — M797 Fibromyalgia: Secondary | ICD-10-CM | POA: Insufficient documentation

## 2024-02-16 DIAGNOSIS — Z905 Acquired absence of kidney: Secondary | ICD-10-CM | POA: Insufficient documentation

## 2024-02-16 DIAGNOSIS — N179 Acute kidney failure, unspecified: Secondary | ICD-10-CM | POA: Diagnosis not present

## 2024-02-16 DIAGNOSIS — R410 Disorientation, unspecified: Secondary | ICD-10-CM | POA: Diagnosis not present

## 2024-02-16 NOTE — Discharge Instructions (Signed)
 Discharge recommendations:   Medications: Patient is to take medications as prescribed. The patient or patient's guardian is to contact a medical professional and/or outpatient provider to address any new side effects that develop. The patient or the patient's guardian should update outpatient providers of any new medications and/or medication changes.    Outpatient Follow up: Please review list of outpatient resources for psychiatry and counseling. Please follow up with your primary care provider for all medical related needs.    Therapy: We recommend that patient participate in individual therapy to address mental health concerns.   Atypical antipsychotics: If you are prescribed an atypical antipsychotic, it is recommended that your height, weight, BMI, blood pressure, fasting lipid panel, and fasting blood sugar be monitored by your outpatient providers.  Safety:   The following safety precautions should be taken:   No sharp objects. This includes scissors, razors, scrapers, and putty knives.   Chemicals should be removed and locked up.   Medications should be removed and locked up.   Weapons should be removed and locked up. This includes firearms, knives and instruments that can be used to cause injury.   The patient should abstain from use of illicit substances/drugs and abuse of any medications.  If symptoms worsen or do not continue to improve or if the patient becomes actively suicidal or homicidal then it is recommended that the patient return to the closest hospital emergency department, the [facility], or call 911 for further evaluation and treatment. National Suicide Prevention Lifeline 1-800-SUICIDE or (860)307-7349.  About 988 988 offers 24/7 access to trained crisis counselors who can help people experiencing mental health-related distress. People can call or text 988 or chat 988lifeline.org for themselves or if they are worried about a  loved one who may need crisis support.    You are encouraged to follow up with [facility] for outpatient treatment.  Walk in/ Open Access Hours: Monday - Friday 8AM - 11AM (To see provider and therapist) PLEASE ARRIVE BY 7:30am  [facility] [address] [place], [place] [phone]

## 2024-02-16 NOTE — ED Provider Notes (Signed)
 Behavioral Health Urgent Care Medical Screening Exam  Patient Name: Heidi Young MRN: [id number] Date of Evaluation: 02/16/24 Chief Complaint:  I just noticed my thinking has been off.  Diagnosis:  Final diagnoses:  Altered mental status, unspecified altered mental status type    History of Present illness: Heidi Young 78 y.o., female patient presented to [facility] as a voluntary walk in accompanied by her adult son with complaints of change in behavior and thinking about 1 week ago.  Heidi Young, is seen face to face by this provider and chart reviewed on 02/16/24.  Per chart review, pt has a PPHx of bipolar 1 disorder and psychosis.  Patient has an extensive medical history including, cardiomyopathy anemia fibromyalgia, history of kidney cancer and surgical removal, hypertension, cardiomyopathy, CKD, lymphedema and history of multiple UTIs.  Upon assessment patient smells of urine and is using a wheelchair with feet propped up in chair due to swelling.  She reports that she was just discharged from being medically hospitalized 2 days ago at [facility] for chest pain and dyspnea.  No current outpatient mental health providers.  Patient currently prescribed mirtazapine  45 mg at bedtime.  She was previously prescribed Seroquel  XR 150 mg years ago but does not currently have that medication.  On evaluation Heidi Young reports that in the past she has been in denial of her diagnoses, including bipolar disorder and schizophrenia, and that she is now ready to receive treatment as she now realizes her change in thinking.  She reports that she has moments of clarity and moments where she feels somewhat confused.  She reports loss of memory.  She endorses agitation at times and can be somewhat mean to her family.  She is currently living on her own as her grandson recently moved out however, her adult son is 5 minutes away and is frequently over his mother's house helping her out.   She reports that she had a point where she just wants to enjoy life.  She denies having suicidal ideations, homicidal ideations and hallucinations.  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  She is able to tell me the date, where she is located, her name/ DOB and the current president's name.   During evaluation Heidi Young is sitting up in wheelchair, in no acute distress.  She is alert & oriented to self, date, place and current president. She is calm, cooperative but easily distracted for this assessment.  Her mood is euthymic with congruent affect.  She has somewhat pressured speech, and restless behavior. Pt does not appear to be responding to internal or external stimuli.  Patient is able to converse coherently at times but does display some circumstantial thinking.   She denies suicidal/self-harm/homicidal ideation, psychosis, and paranoia.    This provider also spoke with her son who states that even while hospitalized a couple of days ago she was showing some signs of underlying mental health issues, however did not see a psychiatric provider.  Her son has all of her medications here with him and states that she is not currently prescribed quetiapine .  He states that her baseline is very quiet, calm and does not talk much at all but has recently been very talkative. He states that twice this week, he has went to pt's house and she had left drawers open, had stuff everywhere around the house including silverware, knives, food and junk . She states that this happens when she is in  that confused state, not in clarity. He reports that pt does have episodes of agitation and makes verbal threats to family members but has not acted or attempted to act on these expressions, this last occurred 3 days ago. Pt requires assistance with ADLs and has been recommended for a home health aid.    Flowsheet Row ED from 02/16/2024 in [facility] ED to Hosp-Admission (Discharged) from 02/10/2024 in [facility] 5 EAST MEDICAL UNIT  C-SSRS RISK CATEGORY No Risk No Risk    Psychiatric Specialty Exam  Presentation  General Appearance:No data recorded Eye Contact:No data recorded Speech:No data recorded Speech Volume:No data recorded Handedness:No data recorded  Mood and Affect  Mood:No data recorded Affect:No data recorded  Thought Process  Thought Processes:No data recorded Descriptions of Associations:No data recorded Orientation:No data recorded Thought Content:No data recorded   Hallucinations:No data recorded Ideas of Reference:No data recorded Suicidal Thoughts:No data recorded Homicidal Thoughts:No data recorded  Sensorium  Memory:No data recorded Judgment:No data recorded Insight:No data recorded  Executive Functions  Concentration:No data recorded Attention Span:No data recorded Recall:No data recorded Fund of Knowledge:No data recorded Language:No data recorded  Psychomotor Activity  Psychomotor Activity:No data recorded  Assets  Assets:No data recorded  Sleep  Sleep:No data recorded Number of hours: No data recorded  Physical Exam: Physical Exam Vitals and nursing note reviewed.  Constitutional:      Appearance: She is obese.  HENT:     Head: Normocephalic.     Nose: Nose normal.  Eyes:     Extraocular Movements: Extraocular movements intact.  Cardiovascular:     Rate and Rhythm: Normal rate.  Pulmonary:     Effort: Pulmonary effort is normal.  Musculoskeletal:        General: Normal range of motion.     Cervical back: Normal range of motion.     Right lower leg: Edema present.     Left lower leg: Edema present.  Neurological:     General: No focal deficit present.     Mental Status: She is alert and oriented to person, place, and time.    Review of Systems  Constitutional: Negative.   HENT: Negative.    Eyes: Negative.   Respiratory: Negative.     Cardiovascular:  Positive for leg swelling.  Gastrointestinal: Negative.   Genitourinary:  Positive for frequency and urgency.  Musculoskeletal:  Positive for back pain, joint pain and myalgias.  Neurological: Negative.   Endo/Heme/Allergies: Negative.   Psychiatric/Behavioral:  Positive for memory loss.    Blood pressure (!) 160/90, pulse 79, temperature 98.7 F (37.1 C), temperature source Oral, resp. rate 19, SpO2 99%. There is no height or weight on file to calculate BMI.  Musculoskeletal: Strength & Muscle Tone: decreased Gait & Station: unsteady Patient leans: Front   [facility] MSE Discharge Disposition for Follow up and Recommendations: Based on my evaluation the patient does not appear to have an emergency medical condition and can be discharged with resources and follow up care in outpatient services for Medication Management and Individual Therapy Pt is discharged home behavioral health urgent care with her adult son with the plan to follow-up this week with the PCP to rule out confusion related to UTI.  Patient is also to return to [facility] for open access services to restart her psychiatric medications which may help her hypomanic symptoms.  Patient will also discuss send for early dementia symptoms with PCP/neurology.  Patient's son is agreeable to stay  with patient until Tuesday morning can present to open access for medication management.  Discussed the following differentials with patient and patient's son: Rule out UTI: New onset confusion in 78 year old female who requires assistance with ADLs.  Follow-up with PCP as soon as possible. Hypomania: Patient has a history of bipolar disorder and was previously on Seroquel  XR 150 mg but stopped taking it some years ago.  Symptoms include some pressured speech, increased energy, circumstantial thinking and mood lability.  Return to [facility] for outpatient psychiatry through open access on Tuesday morning. Early  signs of possible neurocognitive decline: Patient reports memory loss, agitation with family members, difficulty performing ADLs and requiring assistance, feeling confused at times.  Follow-up with PCP as soon as possible.  Alan JAYSON Mcardle, NP 02/16/2024, 2:00 PM
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
## 2024-02-18 ENCOUNTER — Other Ambulatory Visit: Payer: Self-pay

## 2024-02-18 ENCOUNTER — Encounter (HOSPITAL_COMMUNITY): Payer: Self-pay

## 2024-02-18 ENCOUNTER — Inpatient Hospital Stay (HOSPITAL_COMMUNITY)
Admission: EM | Admit: 2024-02-18 | Discharge: 2024-02-24 | DRG: 683 | Disposition: A | Discharge status: Rehabilitation Facility | Attending: Internal Medicine | Admitting: Internal Medicine

## 2024-02-18 DIAGNOSIS — F319 Bipolar disorder, unspecified: Secondary | ICD-10-CM | POA: Diagnosis present

## 2024-02-18 DIAGNOSIS — J45909 Unspecified asthma, uncomplicated: Secondary | ICD-10-CM | POA: Diagnosis present

## 2024-02-18 DIAGNOSIS — Z96652 Presence of left artificial knee joint: Secondary | ICD-10-CM | POA: Diagnosis present

## 2024-02-18 DIAGNOSIS — Z8249 Family history of ischemic heart disease and other diseases of the circulatory system: Secondary | ICD-10-CM

## 2024-02-18 DIAGNOSIS — Z6839 Body mass index (BMI) 39.0-39.9, adult: Secondary | ICD-10-CM

## 2024-02-18 DIAGNOSIS — E66812 Obesity, class 2: Secondary | ICD-10-CM | POA: Diagnosis present

## 2024-02-18 DIAGNOSIS — D631 Anemia in chronic kidney disease: Secondary | ICD-10-CM | POA: Diagnosis present

## 2024-02-18 DIAGNOSIS — E1122 Type 2 diabetes mellitus with diabetic chronic kidney disease: Secondary | ICD-10-CM | POA: Diagnosis present

## 2024-02-18 DIAGNOSIS — F419 Anxiety disorder, unspecified: Secondary | ICD-10-CM | POA: Diagnosis present

## 2024-02-18 DIAGNOSIS — I10 Essential (primary) hypertension: Secondary | ICD-10-CM | POA: Diagnosis present

## 2024-02-18 DIAGNOSIS — Z905 Acquired absence of kidney: Secondary | ICD-10-CM

## 2024-02-18 DIAGNOSIS — E785 Hyperlipidemia, unspecified: Secondary | ICD-10-CM | POA: Diagnosis present

## 2024-02-18 DIAGNOSIS — F259 Schizoaffective disorder, unspecified: Secondary | ICD-10-CM | POA: Diagnosis present

## 2024-02-18 DIAGNOSIS — Z881 Allergy status to other antibiotic agents status: Secondary | ICD-10-CM

## 2024-02-18 DIAGNOSIS — Z823 Family history of stroke: Secondary | ICD-10-CM

## 2024-02-18 DIAGNOSIS — F329 Major depressive disorder, single episode, unspecified: Secondary | ICD-10-CM | POA: Diagnosis present

## 2024-02-18 DIAGNOSIS — K5900 Constipation, unspecified: Secondary | ICD-10-CM | POA: Diagnosis present

## 2024-02-18 DIAGNOSIS — I129 Hypertensive chronic kidney disease with stage 1 through stage 4 chronic kidney disease, or unspecified chronic kidney disease: Secondary | ICD-10-CM | POA: Diagnosis present

## 2024-02-18 DIAGNOSIS — I5189 Other ill-defined heart diseases: Secondary | ICD-10-CM

## 2024-02-18 DIAGNOSIS — Z791 Long term (current) use of non-steroidal anti-inflammatories (NSAID): Secondary | ICD-10-CM

## 2024-02-18 DIAGNOSIS — Z85528 Personal history of other malignant neoplasm of kidney: Secondary | ICD-10-CM

## 2024-02-18 DIAGNOSIS — M797 Fibromyalgia: Secondary | ICD-10-CM | POA: Diagnosis present

## 2024-02-18 DIAGNOSIS — N1832 Chronic kidney disease, stage 3b: Secondary | ICD-10-CM | POA: Diagnosis present

## 2024-02-18 DIAGNOSIS — E872 Acidosis, unspecified: Secondary | ICD-10-CM | POA: Diagnosis present

## 2024-02-18 DIAGNOSIS — M6282 Rhabdomyolysis: Secondary | ICD-10-CM | POA: Diagnosis present

## 2024-02-18 DIAGNOSIS — Z886 Allergy status to analgesic agent status: Secondary | ICD-10-CM

## 2024-02-18 DIAGNOSIS — Z808 Family history of malignant neoplasm of other organs or systems: Secondary | ICD-10-CM

## 2024-02-18 DIAGNOSIS — I429 Cardiomyopathy, unspecified: Secondary | ICD-10-CM | POA: Diagnosis present

## 2024-02-18 DIAGNOSIS — Z7951 Long term (current) use of inhaled steroids: Secondary | ICD-10-CM

## 2024-02-18 DIAGNOSIS — Z91030 Bee allergy status: Secondary | ICD-10-CM

## 2024-02-18 DIAGNOSIS — D649 Anemia, unspecified: Secondary | ICD-10-CM | POA: Diagnosis present

## 2024-02-18 DIAGNOSIS — Z79899 Other long term (current) drug therapy: Secondary | ICD-10-CM

## 2024-02-18 DIAGNOSIS — R410 Disorientation, unspecified: Secondary | ICD-10-CM

## 2024-02-18 DIAGNOSIS — N179 Acute kidney failure, unspecified: Principal | ICD-10-CM | POA: Diagnosis present

## 2024-02-18 DIAGNOSIS — K219 Gastro-esophageal reflux disease without esophagitis: Secondary | ICD-10-CM | POA: Diagnosis present

## 2024-02-18 NOTE — ED Triage Notes (Addendum)
 Pt BIBA from home, c/o skin irritation from voodoo. Denies wanting to hurt herself. Does want to hurt other people. PD is on the way with IVC papers for behavioral reasons.   BP 116 HR 118 O2 98 RA

## 2024-02-18 NOTE — ED Provider Notes (Signed)
 [place] EMERGENCY DEPARTMENT AT [facility] Provider Note   CSN: [id number] Arrival date & time: 02/18/24  2235     Patient presents with: skin irritation   Heidi Young is a 78 y.o. female.  {Add pertinent medical, surgical, social history, OB history to YEP:67052} The history is provided by the patient, medical records and a relative.   Heidi Young is a 77 y.o. female who presents to the Emergency Department complaining of *** Tired of [employer] Son wants checked for uti Trying to get her psych help for the last week. Son at work she was at home. She got agitated and was sitting on the steps.  No SI. Danger to herself.  [place].   1week to 10 days.  Complaining of pain all over No fever, chest pain, sob, dysuria, n/v/d.    Marijuana gummies, occasional alcohol    Prior to Admission medications   Medication Sig Start Date End Date Taking? Authorizing Provider  acetaminophen  (TYLENOL ) 325 MG tablet Take 2 tablets (650 mg total) by mouth every 6 (six) hours as needed for mild pain (pain score 1-3) or fever (or Fever >/= 101). 02/13/24   Sebastian Toribio GAILS, MD  albuterol  (PROVENTIL  HFA;VENTOLIN  HFA) 108 (90 BASE) MCG/ACT inhaler Inhale 2 puffs into the lungs 5 (five) times daily as needed for wheezing or shortness of breath.    [provider]  albuterol  (PROVENTIL ) (2.5 MG/3ML) 0.083% nebulizer solution Take 2.5 mg by nebulization every 4 (four) hours as needed for wheezing or shortness of breath.    [provider]  amLODipine  (NORVASC ) 10 MG tablet Take 10 mg by mouth daily. 02/04/24   [provider]  atorvastatin  (LIPITOR) 20 MG tablet Take 20 mg by mouth at bedtime. 02/04/24   [provider]  Bismuth Subsalicylate (KAOPECTATE PO) Take 1 Dose by mouth 2 (two) times daily as needed (Diarrhea).    [provider]  CONSTULOSE  10 GM/15ML solution Take 20 g by mouth daily as needed for moderate constipation.  10/18/23   [provider]  EPINEPHrine (EPIPEN) 0.3 mg/0.3 mL IJ SOAJ injection Inject 0.3 mg into the muscle as needed (allergic reaction).    [provider]  folic acid  (FOLVITE ) 1 MG tablet Take 1 mg by mouth daily.    [provider]  furosemide  (LASIX ) 40 MG tablet Take 40 mg by mouth daily as needed (for fluid retention.).    [provider]  gabapentin  (NEURONTIN ) 300 MG capsule Take 300 mg by mouth 3 (three) times daily. 02/04/24   [provider]  levocetirizine (XYZAL) 5 MG tablet Take 5 mg by mouth every evening.    [provider]  meclizine  (ANTIVERT ) 25 MG tablet Take 1 tablet (25 mg total) by mouth 3 (three) times daily as needed. 06/17/14   Geiple, Joshua, PA-C  melatonin 3 MG TABS tablet Take 3 mg by mouth at bedtime. 12/21/23   [provider]  metoprolol  tartrate (LOPRESSOR ) 50 MG tablet Take 50 mg by mouth 3 (three) times daily. 02/04/24   [provider]  mirabegron  ER (MYRBETRIQ ) 25 MG TB24 tablet Take 1 tablet (25 mg total) by mouth daily as needed (for urinary frequency with lasix ). 02/13/24   Sebastian Toribio GAILS, MD  mirtazapine  (REMERON ) 45 MG tablet Take 45 mg by mouth at bedtime. 02/04/24   [provider]  mometasone -formoterol  (DULERA ) 100-5 MCG/ACT AERO Inhale 2 puffs into the lungs 2 (two) times daily as needed for wheezing or  shortness of breath.    [provider]  ondansetron  (ZOFRAN -ODT) 4 MG disintegrating tablet Take 4 mg by mouth every 6 (six) hours as needed for nausea or vomiting.    [provider]  OVER THE COUNTER MEDICATION Take 4 tablets by mouth daily. Papaya supplement for nausea    [provider]  pantoprazole  (PROTONIX ) 40 MG tablet Take 1 tablet (40 mg total) by mouth daily at 6 (six) AM. 02/14/24   Sebastian Toribio GAILS, MD  polyethylene glycol (MIRALAX  / GLYCOLAX ) packet Take 17 g by mouth daily as needed (constipation). Mix in 8 oz liquid and drink     [provider]  potassium chloride  SA (K-DUR,KLOR-CON ) 20 MEQ tablet Take 20 mEq by mouth daily as needed (takes with lasix  for fluid retention).    [provider]  QUEtiapine  Fumarate (SEROQUEL  XR) 150 MG 24 hr tablet Take 150 mg by mouth at bedtime.    [provider]  tiZANidine  (ZANAFLEX ) 4 MG tablet Take 4 mg by mouth every 8 (eight) hours as needed for muscle spasms.    [provider]  traMADol  (ULTRAM ) 50 MG tablet Take 50 mg by mouth 3 (three) times daily as needed (pain).    [provider]  VITAMIN D , CHOLECALCIFEROL , PO Take 1 tablet by mouth daily.    [provider]    Allergies: Bee venom, Other, Avelox [moxifloxacin hydrochloride], Nsaids, and Ciprofloxacin    Review of Systems  All other systems reviewed and are negative.   Updated Vital Signs BP (!) 177/85   Pulse (!) 117   Temp 98 F (36.7 C)   Resp 20   Ht 5' 7 (1.702 m)   Wt 113.4 kg   SpO2 100%   BMI 39.16 kg/m   Physical Exam Vitals and nursing note reviewed.  Constitutional:      Appearance: She is well-developed.  HENT:     Head: Normocephalic and atraumatic.  Cardiovascular:     Rate and Rhythm: Regular rhythm. Tachycardia present.     Heart sounds: No murmur heard. Pulmonary:     Effort: Pulmonary effort is normal. No respiratory distress.     Breath sounds: Normal breath sounds.  Abdominal:     Palpations: Abdomen is soft.     Tenderness: There is no abdominal tenderness. There is no guarding or rebound.  Musculoskeletal:        General: No tenderness.  Skin:    General: Skin is warm and dry.  Neurological:     Mental Status: She is alert and oriented to person, place, and time.     Comments: ***  Psychiatric:        Behavior: Behavior normal.     (all labs ordered are listed, but only abnormal results are displayed) Labs Reviewed - No data to display  EKG: None  Radiology: No results found.  {Document cardiac monitor,  telemetry assessment procedure when appropriate:32947} Procedures   Medications Ordered in the ED - No data to display    {Click here for ABCD2, HEART and other calculators REFRESH Note before signing:1}                              Medical Decision Making  ***  {Document critical care time when appropriate  Document review of labs and clinical decision tools ie CHADS2VASC2, etc  Document your independent review of radiology images and any outside records  Document your discussion  with family members, caretakers and with consultants  Document social determinants of health affecting pt's care  Document your decision making why or why not admission, treatments were needed:32947:::1}   Final diagnoses:  None    ED Discharge Orders     None

## 2024-02-19 ENCOUNTER — Emergency Department (HOSPITAL_COMMUNITY)

## 2024-02-19 ENCOUNTER — Other Ambulatory Visit: Payer: Self-pay

## 2024-02-19 DIAGNOSIS — Z7951 Long term (current) use of inhaled steroids: Secondary | ICD-10-CM | POA: Diagnosis not present

## 2024-02-19 DIAGNOSIS — M6282 Rhabdomyolysis: Secondary | ICD-10-CM | POA: Diagnosis present

## 2024-02-19 DIAGNOSIS — F259 Schizoaffective disorder, unspecified: Secondary | ICD-10-CM | POA: Diagnosis present

## 2024-02-19 DIAGNOSIS — N179 Acute kidney failure, unspecified: Principal | ICD-10-CM | POA: Diagnosis present

## 2024-02-19 DIAGNOSIS — E785 Hyperlipidemia, unspecified: Secondary | ICD-10-CM | POA: Diagnosis present

## 2024-02-19 DIAGNOSIS — I129 Hypertensive chronic kidney disease with stage 1 through stage 4 chronic kidney disease, or unspecified chronic kidney disease: Secondary | ICD-10-CM | POA: Diagnosis present

## 2024-02-19 DIAGNOSIS — Z79899 Other long term (current) drug therapy: Secondary | ICD-10-CM | POA: Diagnosis not present

## 2024-02-19 DIAGNOSIS — Z8249 Family history of ischemic heart disease and other diseases of the circulatory system: Secondary | ICD-10-CM | POA: Diagnosis not present

## 2024-02-19 DIAGNOSIS — J45909 Unspecified asthma, uncomplicated: Secondary | ICD-10-CM | POA: Diagnosis present

## 2024-02-19 DIAGNOSIS — Z6839 Body mass index (BMI) 39.0-39.9, adult: Secondary | ICD-10-CM | POA: Diagnosis not present

## 2024-02-19 DIAGNOSIS — Z905 Acquired absence of kidney: Secondary | ICD-10-CM | POA: Diagnosis not present

## 2024-02-19 DIAGNOSIS — D631 Anemia in chronic kidney disease: Secondary | ICD-10-CM | POA: Diagnosis present

## 2024-02-19 DIAGNOSIS — M797 Fibromyalgia: Secondary | ICD-10-CM | POA: Diagnosis present

## 2024-02-19 DIAGNOSIS — Z886 Allergy status to analgesic agent status: Secondary | ICD-10-CM | POA: Diagnosis not present

## 2024-02-19 DIAGNOSIS — F319 Bipolar disorder, unspecified: Secondary | ICD-10-CM | POA: Diagnosis not present

## 2024-02-19 DIAGNOSIS — F419 Anxiety disorder, unspecified: Secondary | ICD-10-CM | POA: Diagnosis present

## 2024-02-19 DIAGNOSIS — Z881 Allergy status to other antibiotic agents status: Secondary | ICD-10-CM | POA: Diagnosis not present

## 2024-02-19 DIAGNOSIS — I429 Cardiomyopathy, unspecified: Secondary | ICD-10-CM | POA: Diagnosis present

## 2024-02-19 DIAGNOSIS — F250 Schizoaffective disorder, bipolar type: Secondary | ICD-10-CM | POA: Diagnosis not present

## 2024-02-19 DIAGNOSIS — R410 Disorientation, unspecified: Secondary | ICD-10-CM | POA: Diagnosis present

## 2024-02-19 DIAGNOSIS — N1832 Chronic kidney disease, stage 3b: Secondary | ICD-10-CM | POA: Diagnosis present

## 2024-02-19 DIAGNOSIS — Z96652 Presence of left artificial knee joint: Secondary | ICD-10-CM | POA: Diagnosis present

## 2024-02-19 DIAGNOSIS — E1122 Type 2 diabetes mellitus with diabetic chronic kidney disease: Secondary | ICD-10-CM | POA: Diagnosis present

## 2024-02-19 DIAGNOSIS — E872 Acidosis, unspecified: Secondary | ICD-10-CM | POA: Diagnosis present

## 2024-02-19 DIAGNOSIS — K219 Gastro-esophageal reflux disease without esophagitis: Secondary | ICD-10-CM | POA: Diagnosis present

## 2024-02-19 DIAGNOSIS — Z91030 Bee allergy status: Secondary | ICD-10-CM | POA: Diagnosis not present

## 2024-02-19 DIAGNOSIS — Z808 Family history of malignant neoplasm of other organs or systems: Secondary | ICD-10-CM | POA: Diagnosis not present

## 2024-02-19 LAB — CBC WITH DIFFERENTIAL/PLATELET
Abs Immature Granulocytes: 0.03 K/uL (ref 0.00–0.07)
Basophils Absolute: 0 K/uL (ref 0.0–0.1)
Basophils Relative: 1 %
Eosinophils Absolute: 0.2 K/uL (ref 0.0–0.5)
Eosinophils Relative: 3 %
HCT: 32 % — ABNORMAL LOW (ref 36.0–46.0)
Hemoglobin: 9.2 g/dL — ABNORMAL LOW (ref 12.0–15.0)
Immature Granulocytes: 0 %
Lymphocytes Relative: 25 %
Lymphs Abs: 2.1 K/uL (ref 0.7–4.0)
MCH: 25.9 pg — ABNORMAL LOW (ref 26.0–34.0)
MCHC: 28.8 g/dL — ABNORMAL LOW (ref 30.0–36.0)
MCV: 90.1 fL (ref 80.0–100.0)
Monocytes Absolute: 0.8 K/uL (ref 0.1–1.0)
Monocytes Relative: 9 %
Neutro Abs: 5.2 K/uL (ref 1.7–7.7)
Neutrophils Relative %: 62 %
Platelets: 286 K/uL (ref 150–400)
RBC: 3.55 MIL/uL — ABNORMAL LOW (ref 3.87–5.11)
RDW: 14.4 % (ref 11.5–15.5)
WBC: 8.4 K/uL (ref 4.0–10.5)
nRBC: 0 % (ref 0.0–0.2)
nRBC: 0 /100{WBCs}

## 2024-02-19 LAB — TROPONIN T, HIGH SENSITIVITY
Troponin T High Sensitivity: 34 ng/L — ABNORMAL HIGH (ref 0–19)
Troponin T High Sensitivity: 34 ng/L — ABNORMAL HIGH (ref 0–19)

## 2024-02-19 LAB — COMPREHENSIVE METABOLIC PANEL WITH GFR
ALT: 40 U/L (ref 0–44)
AST: 48 U/L — ABNORMAL HIGH (ref 15–41)
Albumin: 3.9 g/dL (ref 3.5–5.0)
Alkaline Phosphatase: 82 U/L (ref 38–126)
Anion gap: 17 — ABNORMAL HIGH (ref 5–15)
BUN: 38 mg/dL — ABNORMAL HIGH (ref 8–23)
CO2: 21 mmol/L — ABNORMAL LOW (ref 22–32)
Calcium: 9.7 mg/dL (ref 8.9–10.3)
Chloride: 107 mmol/L (ref 98–111)
Creatinine, Ser: 2.1 mg/dL — ABNORMAL HIGH (ref 0.44–1.00)
GFR, Estimated: 24 mL/min — ABNORMAL LOW (ref >60)
Glucose, Bld: 93 mg/dL (ref 70–99)
Potassium: 4.1 mmol/L (ref 3.5–5.1)
Sodium: 145 mmol/L (ref 135–145)
Total Bilirubin: 0.8 mg/dL (ref 0.0–1.2)
Total Protein: 6.8 g/dL (ref 6.5–8.1)

## 2024-02-19 LAB — CK: Total CK: 1083 U/L — ABNORMAL HIGH (ref 38–234)

## 2024-02-19 LAB — AMMONIA: Ammonia: 28 umol/L (ref 9–35)

## 2024-02-19 MED ORDER — SODIUM CHLORIDE 0.9 % IV BOLUS
1000.0000 mL | Freq: Once | INTRAVENOUS | Status: AC
  Administered 2024-02-19: 1000 mL via INTRAVENOUS

## 2024-02-19 MED ORDER — SODIUM CHLORIDE 0.9 % IV BOLUS
800.0000 mL | Freq: Once | INTRAVENOUS | Status: AC
  Administered 2024-02-19: 800 mL via INTRAVENOUS

## 2024-02-19 MED ORDER — MIRTAZAPINE 15 MG PO TABS
45.0000 mg | ORAL_TABLET | Freq: Every day | ORAL | Status: DC
  Administered 2024-02-19 – 2024-02-23 (×5): 45 mg via ORAL
  Filled 2024-02-19 (×5): qty 3

## 2024-02-19 MED ORDER — LACTATED RINGERS IV SOLN: INTRAVENOUS | Status: DC

## 2024-02-19 MED ORDER — METOPROLOL TARTRATE 50 MG PO TABS
50.0000 mg | ORAL_TABLET | Freq: Three times a day (TID) | ORAL | Status: DC
  Administered 2024-02-19 – 2024-02-24 (×15): 50 mg via ORAL
  Filled 2024-02-19 (×15): qty 1

## 2024-02-19 MED ORDER — HALOPERIDOL LACTATE 5 MG/ML IJ SOLN
5.0000 mg | Freq: Once | INTRAMUSCULAR | Status: AC
  Administered 2024-02-19: 5 mg via INTRAMUSCULAR
  Filled 2024-02-19: qty 1

## 2024-02-19 MED ORDER — PANTOPRAZOLE SODIUM 40 MG PO TBEC
40.0000 mg | DELAYED_RELEASE_TABLET | Freq: Every day | ORAL | Status: DC
  Administered 2024-02-20 – 2024-02-24 (×5): 40 mg via ORAL
  Filled 2024-02-19 (×5): qty 1

## 2024-02-19 MED ORDER — AMLODIPINE BESYLATE 10 MG PO TABS
10.0000 mg | ORAL_TABLET | Freq: Every day | ORAL | Status: DC
  Administered 2024-02-20 – 2024-02-24 (×5): 10 mg via ORAL
  Filled 2024-02-19 (×5): qty 1

## 2024-02-19 MED ORDER — FOLIC ACID 1 MG PO TABS
1.0000 mg | ORAL_TABLET | Freq: Every day | ORAL | Status: DC
Start: 2024-02-20 — End: 2024-02-24
  Administered 2024-02-20 – 2024-02-24 (×5): 1 mg via ORAL
  Filled 2024-02-19 (×5): qty 1

## 2024-02-19 MED ORDER — QUETIAPINE FUMARATE ER 50 MG PO TB24
150.0000 mg | ORAL_TABLET | Freq: Every day | ORAL | Status: DC
  Administered 2024-02-19 – 2024-02-23 (×5): 150 mg via ORAL
  Filled 2024-02-19 (×5): qty 3

## 2024-02-19 MED ORDER — MIRABEGRON ER 25 MG PO TB24
25.0000 mg | ORAL_TABLET | Freq: Every day | ORAL | Status: DC | PRN
Start: 2024-02-19 — End: 2024-02-24

## 2024-02-19 MED ORDER — GABAPENTIN 100 MG PO CAPS
300.0000 mg | ORAL_CAPSULE | Freq: Three times a day (TID) | ORAL | Status: DC
  Administered 2024-02-19 – 2024-02-24 (×15): 300 mg via ORAL
  Filled 2024-02-19 (×7): qty 1
  Filled 2024-02-19: qty 3
  Filled 2024-02-19 (×4): qty 1
  Filled 2024-02-19: qty 3
  Filled 2024-02-19 (×2): qty 1

## 2024-02-19 MED ORDER — SODIUM CHLORIDE 0.45 % IV SOLN: INTRAVENOUS | Status: AC

## 2024-02-19 MED ORDER — ACETAMINOPHEN 325 MG PO TABS
650.0000 mg | ORAL_TABLET | Freq: Four times a day (QID) | ORAL | Status: DC | PRN
  Administered 2024-02-19 – 2024-02-23 (×3): 650 mg via ORAL
  Filled 2024-02-19 (×3): qty 2

## 2024-02-19 MED ORDER — ONDANSETRON HCL 4 MG/2ML IJ SOLN: 4.0000 mg | Freq: Four times a day (QID) | INTRAMUSCULAR | Status: DC | PRN

## 2024-02-19 MED ORDER — ACETAMINOPHEN 650 MG RE SUPP: 650.0000 mg | Freq: Four times a day (QID) | RECTAL | Status: DC | PRN

## 2024-02-19 MED ORDER — HALOPERIDOL LACTATE 5 MG/ML IJ SOLN: 5.0000 mg | Freq: Four times a day (QID) | INTRAMUSCULAR | Status: DC | PRN

## 2024-02-19 MED ORDER — ONDANSETRON HCL 4 MG PO TABS
4.0000 mg | ORAL_TABLET | Freq: Four times a day (QID) | ORAL | Status: DC | PRN
  Administered 2024-02-22 (×2): 4 mg via ORAL
  Filled 2024-02-19 (×2): qty 1

## 2024-02-19 MED ORDER — TRAMADOL HCL 50 MG PO TABS
50.0000 mg | ORAL_TABLET | Freq: Three times a day (TID) | ORAL | Status: DC | PRN
  Administered 2024-02-19 – 2024-02-24 (×10): 50 mg via ORAL
  Filled 2024-02-19 (×10): qty 1

## 2024-02-19 MED ORDER — FLUTICASONE FUROATE-VILANTEROL 100-25 MCG/ACT IN AEPB
1.0000 | INHALATION_SPRAY | Freq: Every day | RESPIRATORY_TRACT | Status: DC
  Administered 2024-02-20 – 2024-02-23 (×4): 1 via RESPIRATORY_TRACT
  Filled 2024-02-19: qty 28

## 2024-02-19 NOTE — ED Notes (Signed)
IV attempt unsuccessful

## 2024-02-19 NOTE — Consult Note (Incomplete)
 [facility] Consult {CHL [facility] INITIAL OR FOLLOW LE:68184}  Patient Name: .Heidi Young  MRN: [id number]  DOB: 1946/06/03  Consult Order details:  Orders (From admission, onward)     Start     Ordered   02/19/24 0625  IP CONSULT TO PSYCHIATRY       Ordering Provider: Sundil, Subrina, MD  Provider:  (Not yet assigned)  Question Answer Comment  Location [facility]   Reason for Consult? Psychosis-bipolar disorder, schizophrenia and anxiety      02/19/24 0624             Mode of Visit: {Type of visit:31911}    Psychiatry Consult Evaluation  Service Date: February 19, 2024 LOS:  LOS: 0 days  Chief Complaint ***  Primary Psychiatric Diagnoses  *** 2.  *** 3.  ***  Assessment  Chandlar Staebell is a 78 y.o. female admitted: {CHL [facility] or Presented to ZI:68182}qnm 02/18/2024 10:38 PM for ***. She carries the psychiatric diagnoses of *** and has a past medical history of  ***.   Her current presentation of *** is most consistent with ***. She meets criteria for *** based on ***.  Current outpatient psychotropic medications include *** and historically she has had a *** response to these medications. She was *** compliant with medications prior to admission as evidenced by ***. On initial examination, patient ***. Please see plan below for detailed recommendations.   Diagnoses:  Active Hospital problems: Principal Problem:   AKI (acute kidney injury) (HCC) Active Problems:   Hyperlipidemia   Essential hypertension   Asthma   Bipolar 1 disorder (HCC)   Grade I diastolic dysfunction   Class 2 obesity   CKD stage 3b, GFR 30-44 ml/min (HCC)   Normocytic anemia   GERD (gastroesophageal reflux disease)   Major depression   Schizoaffective disorder (HCC)   Rhabdomyolysis    Plan   ## Psychiatric Medication Recommendations:  ***  ## Medical Decision Making Capacity: {CHL BH MEDICAL DECISION MAKING CAPACITY:31818}  ## Further  Work-up:  -- *** {CHLmacgeneralandspecificworkuprecs:31821} -- most recent EKG on *** had QtC of *** -- Pertinent labwork reviewed earlier this admission includes: ***   ## Disposition:-- {CHLmaccldispo:31820}  ## Behavioral / Environmental: -{CHLmacbehavioralenvironmental2:31847}    ## Safety and Observation Level:  - Based on my clinical evaluation, I estimate the patient to be at *** risk of self harm in the current setting. - At this time, we recommend  {CHL BH SUICIDE OBSERVATION LEVEL:31850}. This decision is based on my review of the chart including patient's history and current presentation, interview of the patient, mental status examination, and consideration of suicide risk including evaluating suicidal ideation, plan, intent, suicidal or self-harm behaviors, risk factors, and protective factors. This judgment is based on our ability to directly address suicide risk, implement suicide prevention strategies, and develop a safety plan while the patient is in the clinical setting. Please contact our team if there is a concern that risk level has changed.  CSSR Risk Category:C-SSRS RISK CATEGORY: No Risk  Suicide Risk Assessment: Patient has following modifiable risk factors for suicide: {CHLmacmodifiablesuicideriskfactors:31822}, which we are addressing by ***. Patient has following non-modifiable or demographic risk factors for suicide: {CHLmacnonmodifiablesuicideriskfactors:31823} Patient has the following protective factors against suicide: {CHLmacprotectivefactors:31824}  Thank you for this consult request. Recommendations have been communicated to the primary team.  We will *** at this time.   [name], FNP       History of Present Illness  Relevant Aspects of Hospital [facility] or ED course:31819} Course:  Admitted on 02/18/2024 for ***. They ***.   Patient Report:  ***  Psych ROS:  Depression: *** Anxiety:  *** Mania (lifetime and current):  *** Psychosis: (lifetime and current): ***  Collateral information:  Contacted *** at *** on ***  ROS   Psychiatric and Social History  Psychiatric History:  Information collected from ***  Prev Dx/Sx: *** Current Psych Provider: *** Home Meds (current): *** Previous Med Trials: *** Therapy: ***  Prior Psych Hospitalization: ***  Prior Self Harm: *** Prior Violence: ***  Family Psych History: *** Family Hx suicide: ***  Social History:  Developmental Hx: *** Educational Hx: *** Occupational Hx: *** Legal Hx: *** Living Situation: *** Spiritual Hx: *** Access to weapons/lethal means: ***   Substance History Alcohol: ***  Type of alcohol *** Last Drink *** Number of drinks per day *** History of alcohol withdrawal seizures *** History of DT's *** Tobacco: *** Illicit drugs: *** Prescription drug abuse: *** Rehab hx: ***  Exam Findings  Physical Exam: *** Vital Signs:  Temp:  [97.8 F (36.6 C)-98.5 F (36.9 C)] 98.5 F (36.9 C) (09/03 0837) Pulse Rate:  [87-120] 87 (09/03 0837) Resp:  [20-30] 20 (09/03 0837) BP: (96-177)/(64-109) 147/67 (09/03 0837) SpO2:  [93 %-100 %] 99 % (09/03 0837) Weight:  [113.4 kg] 113.4 kg (09/02 2254) Blood pressure (!) 147/67, pulse 87, temperature 98.5 F (36.9 C), resp. rate 20, height 5' 7 (1.702 m), weight 113.4 kg, SpO2 99%. Body mass index is 39.16 kg/m.  Physical Exam  Mental Status Exam: General Appearance: {Appearance:22683}  Orientation:  {BHH ORIENTATION (PAA):22689}  Memory:  {BHH MEMORY:22881}  Concentration:  {Concentration:21399}  Recall:  {BHH GOOD/FAIR/POOR:22877}  Attention  {BH Attention Span:31825}  Eye Contact:  {BHH EYE CONTACT:22684}  Speech:  {Speech:22685}  Language:  {BHH GOOD/FAIR/POOR:22877}  Volume:  {Volume (PAA):22686}  Mood: ***  Affect:  {Affect (PAA):22687}  Thought Process:  {Thought Process (PAA):22688}  Thought Content:  {Thought Content:22690}  Suicidal Thoughts:  {ST/HT  (PAA):22692}  Homicidal Thoughts:  {ST/HT (PAA):22692}  Judgement:  {Judgement (PAA):22694}  Insight:  {Insight (PAA):22695}  Psychomotor Activity:  {Psychomotor (PAA):22696}  Akathisia:  {BHH YES OR NO:22294}  Fund of Knowledge:  {BHH GOOD/FAIR/POOR:22877}      Assets:  {Assets (PAA):22698}  Cognition:  {chl bhh cognition:304700322}  ADL's:  {BHH JIO'D:77709}  AIMS (if indicated):        Other History   These have been pulled in through the EMR, reviewed, and updated if appropriate.  Family History:  The patient's family history includes Aortic stenosis in an other family member; Brain cancer (age of onset: 64) in her father; Coronary artery disease (age of onset: 42) in her mother; Heart failure in her mother; Hypertension in an other family member; Stroke in her mother.  Medical History: Past Medical History:  Diagnosis Date   Asthma    Bipolar 1 disorder (HCC)    Cancer (HCC)    renal cell carcinoma   Cardiomyopathy    Carotid bruit    left   Chest pain    Chronic anemia    DJD (degenerative joint disease)    Fibromyalgia    GERD (gastroesophageal reflux disease)    on rare occas. - uses peptobismol   Heart murmur    History of kidney cancer    unknown type. had R kidney removed b/c of CA.    HLD (hyperlipidemia)    HTN (hypertension)  Kidney stones    Lumbar herniated disc    Obesity    Psychosis (HCC)    Schizophrenia (HCC)    Single kidney    Takotsubo cardiomyopathy    UTI (lower urinary tract infection)     Surgical History: Past Surgical History:  Procedure Laterality Date   BREAST BIOPSY Right 10/24/2022   US  RT BREAST BX W LOC DEV 1ST LESION IMG BX SPEC US  GUIDE 10/24/2022 [facility] MAMMOGRAPHY   BREAST LUMPECTOMY  90's   left, cyst   CARDIAC CATHETERIZATION  2010   no intervention   CESAREAN SECTION  1980   x1   CYSTECTOMY  90's   back (left)   NEPHRECTOMY  1990's   right, secondary to cyst   TOTAL KNEE ARTHROPLASTY  2005   right   TOTAL  KNEE ARTHROPLASTY Left 03/25/2013   Procedure: TOTAL KNEE ARTHROPLASTY;  Surgeon: Norleen LITTIE Gavel, MD;  Location: [facility] OR;  Service: Orthopedics;  Laterality: Left;     Medications:   Current Facility-Administered Medications:    0.45 % sodium chloride  infusion, , Intravenous, Continuous, Celinda Alm Lot, MD   acetaminophen  (TYLENOL ) tablet 650 mg, 650 mg, Oral, Q6H PRN, 650 mg at 02/19/24 1137 **OR** acetaminophen  (TYLENOL ) suppository 650 mg, 650 mg, Rectal, Q6H PRN, Celinda Alm Lot, MD   haloperidol  lactate (HALDOL ) injection 5 mg, 5 mg, Intravenous, Q6H PRN, Celinda Alm Lot, MD   lactated ringers  infusion, , Intravenous, Continuous, Sundil, Subrina, MD   ondansetron  (ZOFRAN ) tablet 4 mg, 4 mg, Oral, Q6H PRN **OR** ondansetron  (ZOFRAN ) injection 4 mg, 4 mg, Intravenous, Q6H PRN, Celinda Alm Lot, MD  Allergies: Allergies  Allergen Reactions   Bee Venom Anaphylaxis   Other Anaphylaxis    Red ant's, roaches    Avelox [Moxifloxacin Hydrochloride] Rash and Other (See Comments)    Fever / Upset Stomach   Nsaids Other (See Comments)    Due to stomach ulcers   Ciprofloxacin Other (See Comments)    Upset stomach    [name], FNP

## 2024-02-19 NOTE — Hospital Course (Addendum)
  78 year old female past medical history of asthma, psychosis, schizophrenia, bipolar disorder, renal cell carcinoma, Takotsubo cardiomyopathy, chronic anemia, osteoarthritis, fibromyalgia, GERD, nephrolithiasis, hyperlipidemia, lumbar disc herniation, essential hypertension and GERD presented to emergency department complaining of skin irritation from voodoo.  Patient does not hurt herself but want to hurt other people.   Brought to ED via police department with IVC paperwork for behavior reason.  Patient's son made the IVC.  Patient was admitted and discharged 8/28 for chest pain and workup was negative.  Patient has extensive workup for chest pain and everything was negative.  Found to have reproducible chest pain.  At presentation to ED patient found to have labile blood pressure hypo and hypertensive.  Tachycardic.  Afebrile.  EKG showed sinus tachycardia heart rate 110. Lab work, elevated CK 1,083.  Normal ammonia.  Elevated troponin level 34.  CBC unremarkable.  CMP showing low bicarb 21, elevated creatinine 2.10 and elevated anion gap 17. Pending UA and UDS.  Chest x-ray showed bilateral atelectasis. CT head no acute endocrine abnormality.  In the ED patient has been given haloperidol  and 1 L of NS bolus.  Hospitalist has been consulted for further evaluation management of acute encephalopathy vs delirium vs psychosis, AKI and rhabdomyolysis.  Need to consult psychiatry for evaluation and medication adjustment.

## 2024-02-19 NOTE — Consult Note (Signed)
 [facility] Psychiatric Consult Initial  Patient Name: .Heidi Young  MRN: [id number]  DOB: 1946-03-25  Consult Order details:  Orders (From admission, onward)     Start     Ordered   02/19/24 0625  IP CONSULT TO PSYCHIATRY       Ordering Provider: Sundil, Subrina, MD  Provider:  (Not yet assigned)  Question Answer Comment  Location [facility]   Reason for Consult? Psychosis-bipolar disorder, schizophrenia and anxiety      02/19/24 0624             Mode of Visit: In person    Psychiatry Consult Evaluation  Service Date: February 19, 2024 LOS:  LOS: 0 days  Chief Complaint Weakness  Primary Psychiatric Diagnoses  Schizoaffective disorder 2.  Bipolar 1 disorder   Assessment  Heidi Young is a 78 y.o. female admitted: Presented to the ED by [facility] 02/18/2024 10:38 PM for IVC. She carries the psychiatric diagnoses of Schizoaffective disorder and has a past medical history of .    Her current presentation of schizophrenia is most consistent with paranoid delusions. She meets criteria for schizophrenia based on a history of fixed false beliefs (e.g., voodoo-related delusions) and ongoing psychotic symptoms that have caused functional impairment over an extended period.  Current outpatient psychotropic medications include quetiapine  (Seroquel ) and mirtazapine , and historically she has had a good response to these medications. She was compliant with medications prior to admission, as evidenced by her report and collateral information confirming regular use.  On initial examination, patient presented with complaints of skin burning she attributed to voodoo, indicative of somatic and persecutory delusions. However, she currently denies these symptoms, suggesting partial remission with treatment.  Diagnoses:  Active Hospital problems: Principal Problem:   AKI (acute kidney injury) (HCC) Active Problems:   Hyperlipidemia   Essential hypertension    Asthma   Bipolar 1 disorder (HCC)   Grade I diastolic dysfunction   Class 2 obesity   CKD stage 3b, GFR 30-44 ml/min (HCC)   Normocytic anemia   GERD (gastroesophageal reflux disease)   Major depression   Schizoaffective disorder (HCC)   Rhabdomyolysis    Plan   ## Psychiatric Medication Recommendations:  -Resume home medications once medically stable -No current medicaitons at this time.   ## Medical Decision Making Capacity: Not specifically addressed in this encounter  ## Further Work-up:  --  TSH, B12, folate, EKG, While pt on Qtc prolonging medications, please monitor & replete K+ to 4 and Mg2+ to 2, TOC consult for substance abuse resources, U/A, or UDS -- most recent EKG on 02/19/24 had QtC of 450 -- Pertinent labwork reviewed earlier this admission includes:    ## Disposition:-- We recommend inpatient psychiatric hospitalization after medical hospitalization. Patient has been involuntarily committed on 02/18/2024.   ## Behavioral / Environmental: -Utilize compassion and acknowledge the patient's experiences while setting clear and realistic expectations for care.    ## Safety and Observation Level:  - Based on my clinical evaluation, I estimate the patient to be at low risk of self harm in the current setting. - At this time, we recommend  1:1 Observation. This decision is based on my review of the chart including patient's history and current presentation, interview of the patient, mental status examination, and consideration of suicide risk including evaluating suicidal ideation, plan, intent, suicidal or self-harm behaviors, risk factors, and protective factors. This judgment is based on our ability to directly address suicide risk, implement suicide prevention strategies, and  develop a safety plan while the patient is in the clinical setting. Please contact our team if there is a concern that risk level has changed.  CSSR Risk Category:C-SSRS RISK CATEGORY: No  Risk  Suicide Risk Assessment: Patient has following modifiable risk factors for suicide: active mental illness (to encompass adhd, tbi, mania, psychosis, trauma reaction), which we are addressing by inpatient psychiatric admission. Patient has following non-modifiable or demographic risk factors for suicide: psychiatric hospitalization Patient has the following protective factors against suicide: Access to outpatient mental health care, Supportive family, Cultural, spiritual, or religious beliefs that discourage suicide, and no history of suicide attempts  Thank you for this consult request. Recommendations have been communicated to the primary team.  We will continue to follow at this time.   [name], FNP       History of Present Illness  Relevant Aspects of [facility] Course:  Admitted on 02/18/2024 for AKI. Heidi Young is a 78 y.o. female with medical history significant of asthma, psychosis, schizophrenia, depression, bipolar 1 disorder, renal cell carcinoma, Takotsubo cardiomyopathy, left carotid bruit, chronic anemia, osteoarthritis, fibromyalgia, GERD, nephrolithiasis, hyperlipidemia, lumbar herniated disc, hypertension, heart murmur, chest pain please who presented to the emergency department with complaints of dyspnea, generalized weakness, arthralgias who presented to the emergency department who was admitted from 02/10/2024 until 02/13/2024 due to chest pain who presented to the emergency department BIBEMS after being [facility] by her son due to self harming behaviors and the delusion that she is having irritation over her skin from Voodoo.  She is still somnolent from earlier haloperidol  administration.  She is able to answer simple questions.  She denied headache, chest, back or abdominal pain at this time.  She does not feel nauseous.   Patient Report:  She states that she was in the house and needed to leave because her skin was on fire. Once outside she realized  she left the blanket and called 911 to tell them her skin felt like it was burning. During this time she does remember having a disagreement with her nosey neighbor. She reports over the past 2 weeks she has noticed increasing irritability, agitation and anger. She denies any trauma or triggers with direct correlation to the mood swings. She reports compliance with her medications. She has is open to inpatient psychiatric treatment. She denies suicidal ideation, homicidal ideations. She denies psychosis, paranoia, and hallucinations at this time.   Psych ROS:  Depression: yes Anxiety:  none noted in chart Mania (lifetime and current): Yes, some recent anger, agitation, and irritability Psychosis: (lifetime and current): skin burning  Collateral information:  Attempted to contact son, who initiated IVC unsuccessful.   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  Vital Signs:  Temp:  [97.8  F (36.6 C)-98.5 F (36.9 C)] 98.5 F (36.9 C) (09/03 0837) Pulse Rate:  [87-120] 87 (09/03 0837) Resp:  [20-30] 20 (09/03 0837) BP: (96-177)/(64-109) 147/67 (09/03 0837) SpO2:  [93 %-100 %] 99 % (09/03 0837) Weight:  [113.4 kg] 113.4 kg (09/02 2254) Blood pressure (!) 147/67, pulse 87, temperature 98.5 F (36.9 C), resp. rate 20, height 5' 7 (1.702 m), weight 113.4 kg, SpO2 99%. Body mass index is 39.16 kg/m.  Physical Exam Vitals and nursing note reviewed.  Constitutional:      Appearance: Normal appearance. She is obese.  Neurological:     General: No focal deficit present.     Mental Status: She is alert and oriented to person, place, and time. Mental status is at baseline.  Psychiatric:        Mood and Affect: Mood normal.        Behavior: Behavior normal.        Thought Content: Thought content normal.        Judgment: Judgment normal.     Mental Status Exam: General Appearance: Disheveled  Orientation:  Full (Time, Place, and Person)  Memory:  Recent;   Fair Remote;   Poor  Concentration:  Concentration: Good and Attention Span: Good  Recall:  Fair  Attention  Good  Eye Contact:  Good  Speech:  Clear and Coherent  Language:  Good  Volume:  Normal  Mood: Pleasant  Affect:  Appropriate  Thought Process:  Linear  Thought Content:  WDL  Suicidal Thoughts:  No  Homicidal Thoughts:  No  Judgement:  Impaired  Insight:  Fair  Psychomotor Activity:  EPS and TD  Akathisia:  Yes  Fund of Knowledge:  Fair      Assets:  Manufacturing systems engineer Desire for Improvement Housing Social Support  Cognition:  Impaired,  Mild  ADL's:  Impaired  AIMS (if indicated):        Other History   These have been pulled in through the EMR, reviewed, and updated if appropriate.  Family History:  The patient's family history includes Aortic stenosis in an other family member; Brain cancer (age of onset: 60) in her father; Coronary artery disease (age of onset: 37) in her mother;  Heart failure in her mother; Hypertension in an other family member; Stroke in her mother.  Medical History: Past Medical History:  Diagnosis Date   Asthma    Bipolar 1 disorder (HCC)    Cancer (HCC)    renal cell carcinoma   Cardiomyopathy    Carotid bruit    left   Chest pain    Chronic anemia    DJD (degenerative joint disease)    Fibromyalgia    GERD (gastroesophageal reflux disease)    on rare occas. - uses peptobismol   Heart murmur    History of kidney cancer    unknown type. had R kidney removed b/c of CA.    HLD (hyperlipidemia)    HTN (hypertension)    Kidney stones    Lumbar herniated disc    Obesity    Psychosis (HCC)    Schizophrenia (HCC)    Single kidney    Takotsubo cardiomyopathy    UTI (lower urinary tract infection)     Surgical History: Past Surgical History:  Procedure Laterality Date  BREAST BIOPSY Right 10/24/2022   US  RT BREAST BX W LOC DEV 1ST LESION IMG BX SPEC US  GUIDE 10/24/2022 [facility] MAMMOGRAPHY   BREAST LUMPECTOMY  90's   left, cyst   CARDIAC CATHETERIZATION  2010   no intervention   CESAREAN SECTION  1980   x1   CYSTECTOMY  90's   back (left)   NEPHRECTOMY  1990's   right, secondary to cyst   TOTAL KNEE ARTHROPLASTY  2005   right   TOTAL KNEE ARTHROPLASTY Left 03/25/2013   Procedure: TOTAL KNEE ARTHROPLASTY;  Surgeon: Norleen LITTIE Gavel, MD;  Location: [facility] OR;  Service: Orthopedics;  Laterality: Left;     Medications:   Current Facility-Administered Medications:    0.45 % sodium chloride  infusion, , Intravenous, Continuous, Celinda Alm Lot, MD   acetaminophen  (TYLENOL ) tablet 650 mg, 650 mg, Oral, Q6H PRN, 650 mg at 02/19/24 1137 **OR** acetaminophen  (TYLENOL ) suppository 650 mg, 650 mg, Rectal, Q6H PRN, Celinda Alm Lot, MD   haloperidol  lactate (HALDOL ) injection 5 mg, 5 mg, Intravenous, Q6H PRN, Celinda Alm Lot, MD   lactated ringers  infusion, , Intravenous, Continuous, Sundil, Subrina, MD   ondansetron  (ZOFRAN ) tablet  4 mg, 4 mg, Oral, Q6H PRN **OR** ondansetron  (ZOFRAN ) injection 4 mg, 4 mg, Intravenous, Q6H PRN, Celinda Alm Lot, MD  Allergies: Allergies  Allergen Reactions   Bee Venom Anaphylaxis   Other Anaphylaxis    Red ant's, roaches    Avelox [Moxifloxacin Hydrochloride] Rash and Other (See Comments)    Fever / Upset Stomach   Nsaids Other (See Comments)    Due to stomach ulcers   Ciprofloxacin Other (See Comments)    Upset stomach    [name], FNP

## 2024-02-19 NOTE — H&P (Addendum)
 History and Physical    Patient: Heidi Young FMW:[id number] DOB: 13-May-1946 DOA: 02/18/2024 DOS: the patient was seen and examined on 02/19/2024 PCP: [name], MD  Patient coming from: Home  Chief Complaint:  Chief Complaint  Patient presents with   skin irritation   HPI: Heidi Young is a 78 y.o. female with medical history significant of asthma, psychosis, schizophrenia, depression, bipolar 1 disorder, renal cell carcinoma, Takotsubo cardiomyopathy, left carotid bruit, chronic anemia, osteoarthritis, fibromyalgia, GERD, nephrolithiasis, hyperlipidemia, lumbar herniated disc, hypertension, heart murmur, chest pain please who presented to the emergency department with complaints of dyspnea, generalized weakness, arthralgias who presented to the emergency department who was admitted from 02/10/2024 until 02/13/2024 due to chest pain who presented to the emergency department BIBEMS after being [facility] by her son due to self harming behaviors and the delusion that she is having irritation over her skin from Voodoo.  She is still somnolent from earlier haloperidol  administration.  She is able to answer simple questions.  She denied headache, chest, back or abdominal pain at this time.  She does not feel nauseous.  Lab work: CBC showed a white count of 8.4, hemoglobin 9.2 g/dL platelets 713.  Ammonia was 28 mol/L.  CMP showed CO2 of 21 mmol/L with an anion gap of 17, the rest of the electrolytes and glucose were normal.  AST was 48 units/L, the rest of the LFTs were normal.  BUN was 38 and creatinine 2.1 mg/dL her baseline renal function last week ranged from 1.28 to 1.63 mg/dL.  Total CK 1083 units/L.  First troponin 34 ng/L.  Imaging: Portable 1 view chest radiograph with cerumen and small lung volumes with bronchovascular crowding.  Questionable hilar adenopathy although these may be bronchovascular crowding.  2 view chest recommended.  2 view chest radiograph showing bibasilar atelectasis  with markedly low volume.  CT head without contrast with no acute intracranial abnormality.  There is age-related atrophy.   ED course: Initial vital signs were temperature 98 F, pulse 9017, respirations 20, BP 177/85 mmHg O2 sat 100% on room air.  Patient received haloperidol  5 mg IVP and 1000 mL normal saline bolus.  Review of Systems: As mentioned in the history of present illness. All other systems reviewed and are negative. Past Medical History:  Diagnosis Date   Asthma    Bipolar 1 disorder (HCC)    Cancer (HCC)    renal cell carcinoma   Cardiomyopathy    Carotid bruit    left   Chest pain    Chronic anemia    DJD (degenerative joint disease)    Fibromyalgia    GERD (gastroesophageal reflux disease)    on rare occas. - uses peptobismol   Heart murmur    History of kidney cancer    unknown type. had R kidney removed b/c of CA.    HLD (hyperlipidemia)    HTN (hypertension)    Kidney stones    Lumbar herniated disc    Obesity    Psychosis (HCC)    Schizophrenia (HCC)    Single kidney    Takotsubo cardiomyopathy    UTI (lower urinary tract infection)    Past Surgical History:  Procedure Laterality Date   BREAST BIOPSY Right 10/24/2022   US  RT BREAST BX W LOC DEV 1ST LESION IMG BX SPEC US  GUIDE 10/24/2022 [facility]   BREAST LUMPECTOMY  90's   left, cyst   CARDIAC CATHETERIZATION  2010   no intervention   CESAREAN SECTION  1980  x1   CYSTECTOMY  90's   back (left)   NEPHRECTOMY  1990's   right, secondary to cyst   TOTAL KNEE ARTHROPLASTY  2005   right   TOTAL KNEE ARTHROPLASTY Left 03/25/2013   Procedure: TOTAL KNEE ARTHROPLASTY;  Surgeon: Norleen LITTIE Gavel, MD;  Location: [facility] OR;  Service: Orthopedics;  Laterality: Left;   Social History:  reports that she has never smoked. She does not have any smokeless tobacco history on file. She reports current drug use. Frequency: 1.00 time per week. Drug: Marijuana. She reports that she does not drink  alcohol.  Allergies  Allergen Reactions   Bee Venom Anaphylaxis   Other Anaphylaxis    Red ant's, roaches    Avelox [Moxifloxacin Hydrochloride] Rash and Other (See Comments)    Fever / Upset Stomach   Nsaids Other (See Comments)    Due to stomach ulcers   Ciprofloxacin Other (See Comments)    Upset stomach    Family History  Problem Relation Age of Onset   Coronary artery disease Mother 62   Heart failure Mother        congestive   Stroke Mother    Brain cancer Father 79   Hypertension Other        siblings   Aortic stenosis Other        sibling    Prior to Admission medications   Medication Sig Start Date End Date Taking? Authorizing Provider  acetaminophen  (TYLENOL ) 325 MG tablet Take 2 tablets (650 mg total) by mouth every 6 (six) hours as needed for mild pain (pain score 1-3) or fever (or Fever >/= 101). 02/13/24   Sebastian Toribio GAILS, MD  albuterol  (PROVENTIL  HFA;VENTOLIN  HFA) 108 (90 BASE) MCG/ACT inhaler Inhale 2 puffs into the lungs 5 (five) times daily as needed for wheezing or shortness of breath.    [provider]  albuterol  (PROVENTIL ) (2.5 MG/3ML) 0.083% nebulizer solution Take 2.5 mg by nebulization every 4 (four) hours as needed for wheezing or shortness of breath.    [provider]  amLODipine  (NORVASC ) 10 MG tablet Take 10 mg by mouth daily. 02/04/24   [provider]  atorvastatin  (LIPITOR) 20 MG tablet Take 20 mg by mouth at bedtime. 02/04/24   [provider]  Bismuth Subsalicylate (KAOPECTATE PO) Take 1 Dose by mouth 2 (two) times daily as needed (Diarrhea).    [provider]  CONSTULOSE  10 GM/15ML solution Take 20 g by mouth daily as needed for moderate constipation. 10/18/23   [provider]  EPINEPHrine (EPIPEN) 0.3 mg/0.3 mL IJ SOAJ injection Inject 0.3 mg into the muscle as needed (allergic reaction).    [provider]  folic acid  (FOLVITE ) 1 MG tablet Take 1 mg by mouth daily.    [provider]  furosemide  (LASIX ) 40 MG tablet Take 40 mg by mouth daily as needed (for fluid retention.).    [provider]  gabapentin  (NEURONTIN ) 300 MG capsule Take 300 mg by mouth 3 (three) times daily. 02/04/24   [provider]  levocetirizine (XYZAL) 5 MG tablet Take 5 mg by mouth every evening.    [provider]  meclizine  (ANTIVERT ) 25 MG tablet Take 1 tablet (25 mg total) by mouth 3 (three) times daily as needed. 06/17/14   Geiple, Joshua, PA-C  melatonin 3 MG TABS tablet Take 3 mg by mouth at bedtime. 12/21/23   [provider]  metoprolol  tartrate (LOPRESSOR ) 50 MG tablet Take 50 mg by mouth  3 (three) times daily. 02/04/24   [provider]  mirabegron  ER (MYRBETRIQ ) 25 MG TB24 tablet Take 1 tablet (25 mg total) by mouth daily as needed (for urinary frequency with lasix ). 02/13/24   Sebastian Toribio GAILS, MD  mirtazapine  (REMERON ) 45 MG tablet Take 45 mg by mouth at bedtime. 02/04/24   [provider]  mometasone -formoterol  (DULERA ) 100-5 MCG/ACT AERO Inhale 2 puffs into the lungs 2 (two) times daily as needed for wheezing or shortness of breath.    [provider]  ondansetron  (ZOFRAN -ODT) 4 MG disintegrating tablet Take 4 mg by mouth every 6 (six) hours as needed for nausea or vomiting.    [provider]  OVER THE COUNTER MEDICATION Take 4 tablets by mouth daily. Papaya supplement for nausea    [provider]  pantoprazole  (PROTONIX ) 40 MG tablet Take 1 tablet (40 mg total) by mouth daily at 6 (six) AM. 02/14/24   Sebastian Toribio GAILS, MD  polyethylene glycol (MIRALAX  / GLYCOLAX ) packet Take 17 g by mouth daily as needed (constipation). Mix in 8 oz liquid and drink    [provider]  potassium chloride  SA (K-DUR,KLOR-CON ) 20 MEQ tablet Take 20 mEq by mouth daily as needed (takes with lasix  for fluid retention).    [provider]  QUEtiapine  Fumarate (SEROQUEL  XR) 150 MG 24 hr tablet Take 150 mg  by mouth at bedtime.    [provider]  tiZANidine  (ZANAFLEX ) 4 MG tablet Take 4 mg by mouth every 8 (eight) hours as needed for muscle spasms.    [provider]  traMADol  (ULTRAM ) 50 MG tablet Take 50 mg by mouth 3 (three) times daily as needed (pain).    [provider]  VITAMIN D , CHOLECALCIFEROL , PO Take 1 tablet by mouth daily.    [provider]    Physical Exam: Vitals:   02/19/24 0300 02/19/24 0430 02/19/24 0630 02/19/24 0735  BP: (!) 149/109  (!) 141/68 (!) 153/64  Pulse: (!) 117 (!) 112 88 97  Resp: (!) 25 (!) 21 20 (!) 25  Temp:    97.8 F (36.6 C)  TempSrc:    Oral  SpO2: 100%  93% 96%  Weight:      Height:       Physical Exam Vitals and nursing note reviewed.  Constitutional:      General: She is awake. She is not in acute distress.    Appearance: Normal appearance. She is obese. She is ill-appearing.  HENT:     Head: Normocephalic.     Nose: No rhinorrhea.     Mouth/Throat:     Mouth: Mucous membranes are dry.  Eyes:     General: No scleral icterus.    Pupils: Pupils are equal, round, and reactive to light.  Neck:     Vascular: No JVD.  Cardiovascular:     Rate and Rhythm: Normal rate and regular rhythm.     Heart sounds: S1 normal and S2 normal.  Pulmonary:     Effort: Pulmonary effort is normal.     Breath sounds: Normal breath sounds. No wheezing, rhonchi or rales.  Abdominal:     General: Bowel sounds are normal. There is no distension.     Palpations: Abdomen is soft.     Tenderness: There is no abdominal tenderness. There is no right CVA tenderness or left CVA tenderness.  Musculoskeletal:     Cervical back: Neck supple.     Right lower leg: No edema.  Left lower leg: No edema.  Skin:    General: Skin is warm and dry.  Neurological:     General: No focal deficit present.     Mental Status: She is alert.  Psychiatric:        Mood and Affect: Mood normal.        Behavior: Behavior normal. Behavior is  cooperative.     Data Reviewed:  Results are pending, will review when available. 02/12/2024 echocardiogram report. IMPRESSIONS:   1. Left ventricular ejection fraction, by estimation, is 55 to 60%. The  left ventricle has normal function. The left ventricle has no regional  wall motion abnormalities. Left ventricular diastolic parameters are  consistent with Grade I diastolic  dysfunction (impaired relaxation).   2. Right ventricular systolic function is normal. The right ventricular  size is normal. IVC is not visualized. RVSP is atleast .   3. The mitral valve is normal in structure. Trivial mitral valve  regurgitation.   4. The aortic valve is normal in structure. Aortic valve regurgitation is  not visualized.  EKG: Vent. rate 127 BPM PR interval 134 ms QRS duration 99 ms QT/QTcB 308/450 ms P-R-T axes 0 52 29 Sinus tachycardia Borderline T abnormalities, anterior leads  Assessment and Plan: Principal Problem:   AKI (acute kidney injury) (HCC) In the setting of:   Rhabdomyolysis Superimposed on:   CKD stage 3b, GFR 30-44 ml/min (HCC) Observation/MedSurg. Continue IV fluids. Hold diuretic. Avoid hypotension. Avoid nephrotoxins. Monitor intake and output. Monitor renal function electrolytes.  Active Problems:   Bipolar 1 disorder (HCC)   Major depression   Schizoaffective disorder (HCC) Continue mirtazapine  45 mg p.o. bedtime. Continue Seroquel  150 mg p.o. bedtime. Continue haloperidol  5 mg IVP every 6 hours as needed. Behavioral health has been consulted. -Will follow the recommendations.    Hyperlipidemia Hold statin due to rhabdomyolysis.    Essential hypertension Continue amlodipine  10 mg p.o. daily. Continue metoprolol  tartrate 50 mg p.o. twice daily.    Asthma Continue Dulera  1 Inh daily or formulary equivalent.    Grade I diastolic dysfunction Continue beta-blocker twice daily.    Class 2 obesity Current BMI 39.16 kg/m. Would  benefit from lifestyle modifications. Follow-up closely with PCP.    Normocytic anemia Monitor hematocrit and hemoglobin.    GERD (gastroesophageal reflux disease) Continue pantoprazole  40 mg p.o. daily.     Advance Care Planning:   Code Status: Full Code   Consults: Behavioral health.  Family Communication:   Severity of Illness: The appropriate patient status for this patient is OBSERVATION. Observation status is judged to be reasonable and necessary in order to provide the required intensity of service to ensure the patient's safety. The patient's presenting symptoms, physical exam findings, and initial radiographic and laboratory data in the context of their medical condition is felt to place them at decreased risk for further clinical deterioration. Furthermore, it is anticipated that the patient will be medically stable for discharge from the hospital within 2 midnights of admission.   Author: Alm Dorn Castor, MD 02/19/2024 8:01 AM  For on call review [email or web address].   This document was prepared using Dragon voice recognition software and may contain some unintended transcription errors.

## 2024-02-20 ENCOUNTER — Ambulatory Visit (HOSPITAL_COMMUNITY): Admitting: Family

## 2024-02-20 DIAGNOSIS — N179 Acute kidney failure, unspecified: Secondary | ICD-10-CM | POA: Diagnosis not present

## 2024-02-20 LAB — COMPREHENSIVE METABOLIC PANEL WITH GFR
ALT: 56 U/L — ABNORMAL HIGH (ref 0–44)
AST: 55 U/L — ABNORMAL HIGH (ref 15–41)
Albumin: 3.7 g/dL (ref 3.5–5.0)
Alkaline Phosphatase: 81 U/L (ref 38–126)
Anion gap: 12 (ref 5–15)
BUN: 27 mg/dL — ABNORMAL HIGH (ref 8–23)
CO2: 21 mmol/L — ABNORMAL LOW (ref 22–32)
Calcium: 9.2 mg/dL (ref 8.9–10.3)
Chloride: 108 mmol/L (ref 98–111)
Creatinine, Ser: 1.47 mg/dL — ABNORMAL HIGH (ref 0.44–1.00)
GFR, Estimated: 36 mL/min — ABNORMAL LOW (ref >60)
Glucose, Bld: 98 mg/dL (ref 70–99)
Potassium: 3.9 mmol/L (ref 3.5–5.1)
Sodium: 141 mmol/L (ref 135–145)
Total Bilirubin: 0.6 mg/dL (ref 0.0–1.2)
Total Protein: 6.4 g/dL — ABNORMAL LOW (ref 6.5–8.1)

## 2024-02-20 LAB — URINE DRUG SCREEN
Amphetamines: NEGATIVE
Barbiturates: NEGATIVE
Benzodiazepines: NEGATIVE
Cocaine: NEGATIVE
Fentanyl: NEGATIVE
Methadone Scn, Ur: NEGATIVE
Opiates: NEGATIVE
Tetrahydrocannabinol: POSITIVE — AB

## 2024-02-20 LAB — URINALYSIS, W/ REFLEX TO CULTURE (INFECTION SUSPECTED)
Bacteria, UA: NONE SEEN
Bilirubin Urine: NEGATIVE
Glucose, UA: NEGATIVE mg/dL
Hgb urine dipstick: NEGATIVE
Ketones, ur: NEGATIVE mg/dL
Nitrite: NEGATIVE
Protein, ur: NEGATIVE mg/dL
Specific Gravity, Urine: 1.008 (ref 1.005–1.030)
pH: 5 (ref 5.0–8.0)

## 2024-02-20 LAB — CBC
HCT: 30.8 % — ABNORMAL LOW (ref 36.0–46.0)
Hemoglobin: 8.8 g/dL — ABNORMAL LOW (ref 12.0–15.0)
MCH: 26 pg (ref 26.0–34.0)
MCHC: 28.6 g/dL — ABNORMAL LOW (ref 30.0–36.0)
MCV: 91.1 fL (ref 80.0–100.0)
Platelets: 311 K/uL (ref 150–400)
RBC: 3.38 MIL/uL — ABNORMAL LOW (ref 3.87–5.11)
RDW: 14.6 % (ref 11.5–15.5)
WBC: 6.4 K/uL (ref 4.0–10.5)
nRBC: 0 % (ref 0.0–0.2)

## 2024-02-20 LAB — CK: Total CK: 705 U/L — ABNORMAL HIGH (ref 38–234)

## 2024-02-20 MED ORDER — SODIUM CHLORIDE 0.45 % IV SOLN: INTRAVENOUS | Status: DC

## 2024-02-20 MED ORDER — ALBUTEROL SULFATE (2.5 MG/3ML) 0.083% IN NEBU: 3.0000 mL | INHALATION_SOLUTION | Freq: Every day | RESPIRATORY_TRACT | Status: DC | PRN

## 2024-02-20 NOTE — Plan of Care (Signed)

## 2024-02-20 NOTE — Progress Notes (Signed)
 PROGRESS NOTE  Heidi Young  FMW:[id number] DOB: 1946/06/05 DOA: 02/18/2024 PCP: [name], MD  Consultants  Brief Narrative: 78 y.o. female with medical history significant of asthma, psychosis, schizophrenia, depression, bipolar 1 disorder, renal cell carcinoma, Takotsubo cardiomyopathy, left carotid bruit, chronic anemia, osteoarthritis, fibromyalgia, GERD, nephrolithiasis, hyperlipidemia, lumbar herniated disc, hypertension, heart murmur, chest pain please who presented to the emergency department with complaints of dyspnea, generalized weakness, arthralgias who presented to the emergency department who was admitted from 02/10/2024 until 02/13/2024 due to chest pain who presented to the emergency department via EMS after being [facility] by her son due to self harming behaviors and the delusion that she is having irritation over her skin from Voodoo.  Admitted to [facility] for same.    Assessment & Plan: Principal Problem:   AKI (acute kidney injury) (HCC) In the setting of:   Rhabdomyolysis Superimposed on:   CKD stage 3b, GFR 30-44 ml/min (HCC) -IV fluids written to be continued on admission but none were hanging on my examination this morning and did not seem to be hanging overnight last night. -Unclear cause of rhabdo.  Last fall was about a week ago per son.  Wonder about outpt excited delirium in light of her psych issues, regardless CK is downtrending.  No hx/o seizures.  - No evidence of compartment syndrome - Restarted today. -Avoid hypotension. -CK downtrending but still 700.  Will continue to trend since she has been off IV fluids for some time. -Serum creatinine improving.  1.47 today from 2.1 yesterday -Transaminitis noted.  Secondary to rhabdo.  Will trend   Active Problems:   Bipolar 1 disorder (HCC)   Major depression   Schizoaffective disorder (HCC) Continue mirtazapine  45 mg p.o. bedtime. Continue Seroquel  150 mg p.o. bedtime. Continue haloperidol  5 mg IVP every 6  hours as needed. -Evaluated by psychiatry today.  Plan will be for continuing home psych medications and recommend for inpatient psychiatric hospitalization after medically cleared. -Spoke with son today who is at bedside and he and patient agree with this plan.     Hyperlipidemia Hold statin due to rhabdomyolysis.     Essential hypertension Continue amlodipine  10 mg p.o. daily. Continue metoprolol  tartrate 50 mg p.o. twice daily.     Asthma Continue Dulera  1 Inh daily or formulary equivalent.     Grade I diastolic dysfunction Continue beta-blocker twice daily.     Class 2 obesity Current BMI 39.16 kg/m. Would benefit from lifestyle modifications. Follow-up closely with PCP.     Normocytic anemia Monitor hematocrit and hemoglobin.     GERD (gastroesophageal reflux disease) Continue pantoprazole  40 mg p.o. daily.   DVT prophylaxis:  SCDs Start: 02/19/24 0809  Code Status:   Code Status: Full Code Family Communication: Son at bedside and all questions answered Level of care: Med-Surg Status is: Inpatient Dispo: Inpatient while we treat her AKI and rhabdomyolysis  Subjective: No complaints today except for the way she was treated by police when brought to the emergency department.  Otherwise fully aware of what led her to be brought to the hospital.  Eating and drinking well.  Does have some mild bilateral thigh and calf pain.  Objective: Vitals:   02/20/24 0623 02/20/24 0913 02/20/24 0923 02/20/24 0925  BP: (!) 153/72 (!) 153/72    Pulse: 84     Resp: (!) 22     Temp: 98 F (36.7 C)     TempSrc: Oral     SpO2: 99%  98% 98%  Weight:  Height:        Intake/Output Summary (Last 24 hours) at 02/20/2024 1239 Last data filed at 02/20/2024 0818 Gross per 24 hour  Intake 320 ml  Output --  Net 320 ml   Filed Weights   02/18/24 2254  Weight: 113.4 kg   Body mass index is 39.16 kg/m.  Gen: 78 y.o. female in no apparent distress.  Nontoxic Pulm: Non-labored  breathing.  Clear to auscultation bilaterally.  CV: Regular rate and rhythm. Grade II/VI SEM noted.  GI: Abdomen soft, non-tender, non-distended, with normoactive bowel sounds. No organomegaly or masses felt. Ext: Warm, no deformities, obese legs but no pitting edema and not tightness/tenderness consistent with compartment syndrome.  Skin: No rashes, lesions no ulcers Neuro: Alert and oriented. No focal neurological deficits. Psych: Calm     I have personally reviewed the following labs and images: CBC: Recent Labs  Lab 02/19/24 0323 02/20/24 0601  WBC 8.4 6.4  NEUTROABS 5.2  --   HGB 9.2* 8.8*  HCT 32.0* 30.8*  MCV 90.1 91.1  PLT 286 311   BMP &GFR Recent Labs  Lab 02/19/24 0323 02/20/24 0601  NA 145 141  K 4.1 3.9  CL 107 108  CO2 21* 21*  GLUCOSE 93 98  BUN 38* 27*  CREATININE 2.10* 1.47*  CALCIUM  9.7 9.2   Estimated Creatinine Clearance: 41 mL/min (A) (by C-G formula based on SCr of 1.47 mg/dL (H)). Liver & Pancreas: Recent Labs  Lab 02/19/24 0323 02/20/24 0601  AST 48* 55*  ALT 40 56*  ALKPHOS 82 81  BILITOT 0.8 0.6  PROT 6.8 6.4*  ALBUMIN  3.9 3.7   No results for input(s): LIPASE, AMYLASE in the last 168 hours. Recent Labs  Lab 02/19/24 0323  AMMONIA 28   Diabetic: No results for input(s): HGBA1C in the last 72 hours. No results for input(s): GLUCAP in the last 168 hours. Cardiac Enzymes: Recent Labs  Lab 02/19/24 0323 02/20/24 0601  CKTOTAL 1,083* 705*   Recent Labs    02/11/24 0000  PROBNP 401.0*   Coagulation Profile: No results for input(s): INR, PROTIME in the last 168 hours. Thyroid  Function Tests: No results for input(s): TSH, T4TOTAL, FREET4, T3FREE, THYROIDAB in the last 72 hours. Lipid Profile: No results for input(s): CHOL, HDL, LDLCALC, TRIG, CHOLHDL, LDLDIRECT in the last 72 hours. Anemia Panel: No results for input(s): VITAMINB12, FOLATE, FERRITIN, TIBC, IRON, RETICCTPCT in  the last 72 hours. Urine analysis:    Component Value Date/Time   COLORURINE YELLOW 03/23/2014 0710   APPEARANCEUR CLEAR 03/23/2014 0710   LABSPEC 1.023 03/23/2014 0710   PHURINE 5.5 03/23/2014 0710   GLUCOSEU NEGATIVE 03/23/2014 0710   HGBUR NEGATIVE 03/23/2014 0710   BILIRUBINUR NEGATIVE 03/23/2014 0710   KETONESUR NEGATIVE 03/23/2014 0710   PROTEINUR 30 (A) 03/23/2014 0710   UROBILINOGEN 0.2 03/23/2014 0710   NITRITE NEGATIVE 03/23/2014 0710   LEUKOCYTESUR NEGATIVE 03/23/2014 0710   Sepsis Labs: Invalid input(s): PROCALCITONIN, LACTICIDVEN  Microbiology: No results found for this or any previous visit (from the past 240 hours).  Radiology Studies: No results found.  Scheduled Meds:  amLODipine   10 mg Oral Daily   fluticasone  furoate-vilanterol  1 puff Inhalation Daily   folic acid   1 mg Oral Daily   gabapentin   300 mg Oral Q8H   metoprolol  tartrate  50 mg Oral Q8H   mirtazapine   45 mg Oral QHS   pantoprazole   40 mg Oral Daily   QUEtiapine  Fumarate  150 mg Oral QHS  Continuous Infusions:  sodium chloride        LOS: 1 day   35 minutes with more than 50% spent in reviewing records, counseling patient/family and coordinating care.  Heidi VEAR Gaw, MD [facility] [email or web address] 02/20/2024, 12:39 PM

## 2024-02-21 DIAGNOSIS — F259 Schizoaffective disorder, unspecified: Secondary | ICD-10-CM | POA: Diagnosis not present

## 2024-02-21 DIAGNOSIS — N179 Acute kidney failure, unspecified: Secondary | ICD-10-CM | POA: Diagnosis not present

## 2024-02-21 DIAGNOSIS — J45909 Unspecified asthma, uncomplicated: Secondary | ICD-10-CM

## 2024-02-21 DIAGNOSIS — F319 Bipolar disorder, unspecified: Secondary | ICD-10-CM | POA: Diagnosis not present

## 2024-02-21 DIAGNOSIS — M6282 Rhabdomyolysis: Secondary | ICD-10-CM

## 2024-02-21 DIAGNOSIS — E785 Hyperlipidemia, unspecified: Secondary | ICD-10-CM

## 2024-02-21 DIAGNOSIS — E66812 Obesity, class 2: Secondary | ICD-10-CM

## 2024-02-21 DIAGNOSIS — D649 Anemia, unspecified: Secondary | ICD-10-CM

## 2024-02-21 DIAGNOSIS — N1832 Chronic kidney disease, stage 3b: Secondary | ICD-10-CM

## 2024-02-21 LAB — CBC
HCT: 28.4 % — ABNORMAL LOW (ref 36.0–46.0)
Hemoglobin: 8.1 g/dL — ABNORMAL LOW (ref 12.0–15.0)
MCH: 26.2 pg (ref 26.0–34.0)
MCHC: 28.5 g/dL — ABNORMAL LOW (ref 30.0–36.0)
MCV: 91.9 fL (ref 80.0–100.0)
Platelets: 299 K/uL (ref 150–400)
RBC: 3.09 MIL/uL — ABNORMAL LOW (ref 3.87–5.11)
RDW: 14.6 % (ref 11.5–15.5)
WBC: 6.5 K/uL (ref 4.0–10.5)
nRBC: 0 % (ref 0.0–0.2)

## 2024-02-21 LAB — COMPREHENSIVE METABOLIC PANEL WITH GFR
ALT: 46 U/L — ABNORMAL HIGH (ref 0–44)
AST: 35 U/L (ref 15–41)
Albumin: 3.5 g/dL (ref 3.5–5.0)
Alkaline Phosphatase: 72 U/L (ref 38–126)
Anion gap: 11 (ref 5–15)
BUN: 21 mg/dL (ref 8–23)
CO2: 21 mmol/L — ABNORMAL LOW (ref 22–32)
Calcium: 9.4 mg/dL (ref 8.9–10.3)
Chloride: 107 mmol/L (ref 98–111)
Creatinine, Ser: 1.3 mg/dL — ABNORMAL HIGH (ref 0.44–1.00)
GFR, Estimated: 42 mL/min — ABNORMAL LOW (ref >60)
Glucose, Bld: 89 mg/dL (ref 70–99)
Potassium: 4.1 mmol/L (ref 3.5–5.1)
Sodium: 139 mmol/L (ref 135–145)
Total Bilirubin: 0.5 mg/dL (ref 0.0–1.2)
Total Protein: 5.9 g/dL — ABNORMAL LOW (ref 6.5–8.1)

## 2024-02-21 LAB — URINE CULTURE: Culture: <10000 — AB

## 2024-02-21 LAB — CK: Total CK: 471 U/L — ABNORMAL HIGH (ref 38–234)

## 2024-02-21 NOTE — Progress Notes (Signed)
 PROGRESS NOTE  Heidi Young  FMW:[id number] DOB: [birth year] DOA: 02/18/2024 PCP: [name], MD  Consultants  Brief Narrative: 78 y.o. female with medical history significant of asthma, psychosis, schizophrenia, depression, bipolar 1 disorder, renal cell carcinoma, Takotsubo cardiomyopathy, left carotid bruit, chronic anemia, osteoarthritis, fibromyalgia, GERD, nephrolithiasis, hyperlipidemia, lumbar herniated disc, hypertension, heart murmur, chest pain please who presented to the emergency department with complaints of dyspnea, generalized weakness, arthralgias who presented to the emergency department who was admitted from 02/10/2024 until 02/13/2024 due to chest pain who presented to the emergency department via EMS after being IVC'ed by her son due to self harming behaviors and the delusion that she is having irritation over her skin from Voodoo.  Admitted to [facility] for same.    Assessment & Plan:   AKI (acute kidney injury) on CKD stage 3b, GFR 30-44 ml/min - 2/2 to rhabdo below. -Creatinine today is 1.3.  Her baseline creatinine going back to at least 2015 appears to be about 1.2-1.3 so looks like she is at her baseline.  Rhabdomyolysis -CK downtrending.  Will continue IV fluids x 24 more hours to ensure continued improvement. -LFTs also downtrending. - Unclear cause of rhabdo.  Last fall was about a week ago per son.  Wonder about outpt excited delirium in light of her psych issues, regardless CK is downtrending.  No hx/o seizures.  - No evidence of compartment syndrome, up and moving around room easily without any leg pain/tenderness     Bipolar 1 disorder (HCC)   Major depression   Schizoaffective disorder (HCC) -Reason for initial presentation to the emergency department. -Psych following.  Continue mirtazapine  45 mg p.o. bedtime. Continue Seroquel  150 mg p.o. bedtime. Continue haloperidol  5 mg IVP every 6 hours as needed. Plan will be for continuing home psych medications  and recommend for inpatient psychiatric hospitalization after medically cleared.  Patient and son both agree with plan     Hyperlipidemia Hold statin due to rhabdomyolysis. Restart at discharge     Essential hypertension Continue amlodipine  10 mg p.o. daily. Continue metoprolol  tartrate 50 mg p.o. twice daily. BP is acceptable here   Asthma Continue Dulera  1 Inh daily or formulary equivalent.     Grade I diastolic dysfunction Continue beta-blocker twice daily.     Class 2 obesity Current BMI 39.16 kg/m. Would benefit from lifestyle modifications. Follow-up closely with PCP.     Normocytic anemia Monitor hematocrit and hemoglobin.     GERD (gastroesophageal reflux disease) Continue pantoprazole  40 mg p.o. daily.   DVT prophylaxis:  SCDs Start: 02/19/24 0809  Code Status:   Code Status: Full Code Family Communication: Son at bedside and all questions answered Level of care: Med-Surg Status is: Inpatient Dispo: Inpatient while we treat her AKI and rhabdomyolysis  Subjective: No complaints today, reports feeling very well.  Eating and drinking well.  Objective: Vitals:   02/20/24 1913 02/21/24 0619 02/21/24 0620 02/21/24 0901  BP: 138/68 134/60 134/60 (!) 154/77  Pulse: 81 80 80 80  Resp: 17 20    Temp: 98.8 F (37.1 C) 98.3 F (36.8 C)    TempSrc: Oral Oral    SpO2: 100% 99%    Weight:      Height:        Intake/Output Summary (Last 24 hours) at 02/21/2024 1255 Last data filed at 02/20/2024 1501 Gross per 24 hour  Intake 390 ml  Output --  Net 390 ml   Filed Weights   02/18/24 2254  Weight: 113.4 kg  Body mass index is 39.16 kg/m.  Gen: 78 y.o. female in no apparent distress.  Nontoxic Pulm: Non-labored breathing.  Clear to auscultation bilaterally.  CV: Regular rate and rhythm. Grade II/VI SEM noted.  GI: Abdomen soft, non-tender, non-distended, with normoactive bowel sounds. No organomegaly or masses felt. Ext: Warm, no deformities, obese legs but  no pitting edema and not tightness/tenderness consistent with compartment syndrome.  Skin: No rashes, lesions no ulcers Neuro: Alert and oriented. No focal neurological deficits. Psych: Calm     I have personally reviewed the following labs and images: CBC: Recent Labs  Lab 02/19/24 0323 02/20/24 0601 02/21/24 0358  WBC 8.4 6.4 6.5  NEUTROABS 5.2  --   --   HGB 9.2* 8.8* 8.1*  HCT 32.0* 30.8* 28.4*  MCV 90.1 91.1 91.9  PLT 286 311 299   BMP &GFR Recent Labs  Lab 02/19/24 0323 02/20/24 0601 02/21/24 0358  NA 145 141 139  K 4.1 3.9 4.1  CL 107 108 107  CO2 21* 21* 21*  GLUCOSE 93 98 89  BUN 38* 27* 21  CREATININE 2.10* 1.47* 1.30*  CALCIUM  9.7 9.2 9.4   Estimated Creatinine Clearance: 46.3 mL/min (A) (by C-G formula based on SCr of 1.3 mg/dL (H)). Liver & Pancreas: Recent Labs  Lab 02/19/24 0323 02/20/24 0601 02/21/24 0358  AST 48* 55* 35  ALT 40 56* 46*  ALKPHOS 82 81 72  BILITOT 0.8 0.6 0.5  PROT 6.8 6.4* 5.9*  ALBUMIN  3.9 3.7 3.5   No results for input(s): LIPASE, AMYLASE in the last 168 hours. Recent Labs  Lab 02/19/24 0323  AMMONIA 28   Diabetic: No results for input(s): HGBA1C in the last 72 hours. No results for input(s): GLUCAP in the last 168 hours. Cardiac Enzymes: Recent Labs  Lab 02/19/24 0323 02/20/24 0601 02/21/24 0358  CKTOTAL 1,083* 705* 471*   Recent Labs    02/11/24 0000  PROBNP 401.0*   Coagulation Profile: No results for input(s): INR, PROTIME in the last 168 hours. Thyroid  Function Tests: No results for input(s): TSH, T4TOTAL, FREET4, T3FREE, THYROIDAB in the last 72 hours. Lipid Profile: No results for input(s): CHOL, HDL, LDLCALC, TRIG, CHOLHDL, LDLDIRECT in the last 72 hours. Anemia Panel: No results for input(s): VITAMINB12, FOLATE, FERRITIN, TIBC, IRON, RETICCTPCT in the last 72 hours. Urine analysis:    Component Value Date/Time   COLORURINE STRAW (A) 02/20/2024  1357   APPEARANCEUR CLEAR 02/20/2024 1357   LABSPEC 1.008 02/20/2024 1357   PHURINE 5.0 02/20/2024 1357   GLUCOSEU NEGATIVE 02/20/2024 1357   HGBUR NEGATIVE 02/20/2024 1357   BILIRUBINUR NEGATIVE 02/20/2024 1357   KETONESUR NEGATIVE 02/20/2024 1357   PROTEINUR NEGATIVE 02/20/2024 1357   UROBILINOGEN 0.2 03/23/2014 0710   NITRITE NEGATIVE 02/20/2024 1357   LEUKOCYTESUR SMALL (A) 02/20/2024 1357   Sepsis Labs: Invalid input(s): PROCALCITONIN, LACTICIDVEN  Microbiology: No results found for this or any previous visit (from the past 240 hours).  Radiology Studies: No results found.  Scheduled Meds:  amLODipine   10 mg Oral Daily   fluticasone  furoate-vilanterol  1 puff Inhalation Daily   folic acid   1 mg Oral Daily   gabapentin   300 mg Oral Q8H   metoprolol  tartrate  50 mg Oral Q8H   mirtazapine   45 mg Oral QHS   pantoprazole   40 mg Oral Daily   QUEtiapine  Fumarate  150 mg Oral QHS   Continuous Infusions:  sodium chloride  100 mL/hr at 02/21/24 1028     LOS: 2 days  35 minutes with more than 50% spent in reviewing records, counseling patient/family and coordinating care.  Reyes VEAR Gaw, MD [facility] [email or web address] 02/21/2024, 12:55 PM

## 2024-02-21 NOTE — Plan of Care (Signed)

## 2024-02-21 NOTE — TOC Progression Note (Signed)
 Transition of Care [facility]) - Progression Note    Patient Details  Name: Heidi Young MRN: [id number] Date of Birth: 07-May-1946  Transition [facility] Care [facility]) CM/SW Contact  Doneta Glenys DASEN, RN Phone Number: 02/21/2024, 4:23 PM  Clinical Narrative:    CM sent out referrals for a BH bed. Awaiting offers   Expected Discharge Plan and Services    Social Drivers of Health (SDOH) Interventions SDOH Screenings   Food Insecurity: No Food Insecurity (02/19/2024)  Recent Concern: Food Insecurity - Food Insecurity Present (02/11/2024)  Housing: Low Risk  (02/19/2024)  Transportation Needs: No Transportation Needs (02/19/2024)  Utilities: Not At Risk (02/19/2024)  Social Connections: Moderately Isolated (02/19/2024)  Tobacco Use: Unknown (02/18/2024)    Readmission Risk Interventions    02/20/2024    4:47 PM  Readmission Risk Prevention Plan  Transportation Screening Complete  PCP or Specialist Appt within 3-5 Days Complete  HRI or Home Care Consult Complete  Social Work Consult for Recovery Care Planning/Counseling Complete  Palliative Care Screening Not Applicable  Medication Review Oceanographer) Complete

## 2024-02-21 NOTE — TOC Initial Note (Addendum)
 Transition of Care [facility]) - Initial/Assessment Note    Patient Details  Name: Heidi Young MRN: [id number] Date of Birth: 01/21/1946  Transition [facility] Care [facility]) CM/SW Contact:    Doneta Glenys DASEN, RN Phone Number:  Clinical Narrative:                 IVC on 9/2 at 10:43 PM. Expires 9/9/ at 10:43 PM IVC folder with 4 copies in shadow chart. Patient states PTA lives with son.DME-walker, rollator,BSC and active with [facility] for [facility] PT.  Expected Discharge Plan: Home w Home Health Services Barriers to Discharge: Continued Medical Work up, Other (must enter comment) (IVC)   Patient Goals and CMS Choice   CMS Medicare.gov Compare Post Acute Care list provided to::  (NA) Choice offered to / list presented to : NA [place] ownership interest in [facility].provided to:: Parent NA    Expected Discharge Plan and Services In-house Referral: NA Discharge Planning Services: CM Consult Post Acute Care Choice: NA Living arrangements for the past 2 months: Apartment                 DME Arranged: N/A DME Agency: NA       HH Arranged: NA HH Agency: NA        Prior Living Arrangements/Services Living arrangements for the past 2 months: Apartment Lives with:: Adult Children Patient language and need for interpreter reviewed:: Yes Do you feel safe going back to the place where you live?: Yes      Need for Family Participation in Patient Care: Yes (Comment) Care giver support system in place?: Yes (comment)   Criminal Activity/Legal Involvement Pertinent to Current Situation/Hospitalization: No - Comment as needed  Activities of Daily Living   ADL Screening (condition at time of admission) Independently performs ADLs?: Yes (appropriate for developmental age) Is the patient deaf or have difficulty hearing?: No Does the patient have difficulty seeing, even when wearing glasses/contacts?: No Does the patient have difficulty concentrating, remembering, or making  decisions?: No  Permission Sought/Granted Permission sought to share information with : Case Manager Permission granted to share information with : Yes, Verbal Permission Granted  Share Information with NAME: elnor Hsu (Son)  [phone]           Emotional Assessment Appearance:: Appears stated age Attitude/Demeanor/Rapport: Engaged Affect (typically observed): Pleasant, Appropriate Orientation: : Oriented to Self, Oriented to Place, Oriented to  Time, Oriented to Situation Alcohol / Substance Use: Not Applicable    Admission diagnosis:  Delirium [R41.0] AKI (acute kidney injury) (HCC) [N17.9] Patient Active Problem List   Diagnosis Date Noted   AKI (acute kidney injury) (HCC) 02/19/2024   Rhabdomyolysis 02/19/2024   Chest pain 02/11/2024   Grade I diastolic dysfunction 02/11/2024   Class 2 obesity 02/11/2024   Lymphedema 02/11/2024   CKD stage 3b, GFR 30-44 ml/min (HCC) 02/11/2024   Normocytic anemia 02/11/2024   Kidney stones 02/11/2024   Schizoaffective disorder (HCC) 02/11/2024   Dyspnea 12/17/2013   Syncope 12/16/2013   Acute on chronic renal failure (HCC) 12/16/2013   Bipolar disorder (HCC) 10/05/2013   Bipolar 1 disorder (HCC) 10/05/2013   GERD (gastroesophageal reflux disease) 08/30/2013   History of kidney cancer 08/30/2013   Schizophrenia (HCC) 08/30/2013   Leg edema 08/30/2013   Major depression 08/28/2013   Osteoarthritis of left knee 03/25/2013   CARDIOMYOPATHY 04/13/2009   Hyperlipidemia 04/12/2009   Essential hypertension 04/12/2009   Asthma 04/12/2009   Osteoarthritis 04/12/2009   FIBROMYALGIA 04/12/2009   CHEST  PAIN-UNSPECIFIED 04/12/2009   PCP:  [name], MD Pharmacy:   [facility] [phone] - [address], [place] - [address] AT [address] [place] [address] Phone: [phone] Fax: [phone]  [id number] - [address] [place] - [address] [place] [address] Phone:  [phone] Fax: [phone]     Social Drivers of Health (SDOH) Social History: SDOH Screenings   Food Insecurity: No Food Insecurity (02/19/2024)  Recent Concern: Food Insecurity - Food Insecurity Present (02/11/2024)  Housing: Low Risk  (02/19/2024)  Transportation Needs: No Transportation Needs (02/19/2024)  Utilities: Not At Risk (02/19/2024)  Social Connections: Moderately Isolated (02/19/2024)  Tobacco Use: Unknown (02/18/2024)   SDOH Interventions:     Readmission Risk Interventions    02/20/2024    4:47 PM  Readmission Risk Prevention Plan  Transportation Screening Complete  PCP or Specialist Appt within 3-5 Days Complete  HRI or Home Care Consult Complete  Social Work Consult for Recovery Care Planning/Counseling Complete  Palliative Care Screening Not Applicable  Medication Review Oceanographer) Complete

## 2024-02-21 NOTE — TOC Progression Note (Addendum)
 Transition of Care [facility]) - Progression Note    Patient Details  Name: Heidi Young MRN: [id number] Date of Birth: [birth year]  Transition [facility]) CM/SW Contact  Doneta Glenys DASEN, RN Phone Number: 02/21/2024, 4:34 PM  Clinical Narrative:    CM sent out referrals for [facility] 5:00 PM [facility] has accepted patient. Dr. Millie Manners admitting provider. When medically ready for discharge the nurse to call report to [phone], option 2.  Expected Discharge Plan: Home w Home Health Services Barriers to Discharge: Continued Medical Work up, Other (must enter comment) (IVC)               Expected Discharge Plan and Services In-house Referral: NA Discharge Planning Services: CM Consult Post Acute Care Choice: NA Living arrangements for the past 2 months: Apartment                 DME Arranged: N/A DME Agency: NA       HH Arranged: NA HH Agency: NA         Social Drivers of Health (SDOH) Interventions SDOH Screenings   Food Insecurity: No Food Insecurity (02/19/2024)  Recent Concern: Food Insecurity - Food Insecurity Present (02/11/2024)  Housing: Low Risk  (02/19/2024)  Transportation Needs: No Transportation Needs (02/19/2024)  Utilities: Not At Risk (02/19/2024)  Social Connections: Moderately Isolated (02/19/2024)  Tobacco Use: Unknown (02/18/2024)    Readmission Risk Interventions    02/20/2024    4:47 PM  Readmission Risk Prevention Plan  Transportation Screening Complete  PCP or Specialist Appt within 3-5 Days Complete  HRI or Home Care Consult Complete  Social Work Consult for Recovery Care Planning/Counseling Complete  Palliative Care Screening Not Applicable  Medication Review Oceanographer) Complete

## 2024-02-21 NOTE — Consult Note (Signed)
 [facility] Psychiatric Consult Initial  Patient Name: .Heidi Young  MRN: [id number]  DOB: 09-13-1945  Consult Order details:  Orders (From admission, onward)     Start     Ordered   02/19/24 0625  IP CONSULT TO PSYCHIATRY       Ordering Provider: Sundil, Subrina, MD  Provider:  (Not yet assigned)  Question Answer Comment  Location [facility]   Reason for Consult? Psychosis-bipolar disorder, schizophrenia and anxiety      02/19/24 0624             Mode of Visit: In person    Psychiatry Consult Evaluation  Service Date: February 21, 2024 LOS:  LOS: 2 days  Chief Complaint Weakness  Primary Psychiatric Diagnoses  Schizoaffective disorder 2.  Bipolar 1 disorder   Assessment  Heidi Young is a 78 y.o. female admitted: Presented to the ED by [facility] 02/18/2024 10:38 PM for IVC. She carries the psychiatric diagnoses of Schizoaffective disorder and has a past medical history of .    Her current presentation of schizophrenia is most consistent with paranoid delusions. She meets criteria for schizophrenia based on a history of fixed false beliefs (e.g., voodoo-related delusions) and ongoing psychotic symptoms that have caused functional impairment over an extended period.  Current outpatient psychotropic medications include quetiapine  (Seroquel ) and mirtazapine , and historically she has had a good response to these medications. She was compliant with medications prior to admission, as evidenced by her report and collateral information confirming regular use.  On initial examination, patient presented with complaints of skin burning she attributed to voodoo, indicative of somatic and persecutory delusions. However, she currently denies these symptoms, suggesting partial remission with treatment.  Diagnoses:  Active Hospital problems: Principal Problem:   AKI (acute kidney injury) (HCC) Active Problems:   Hyperlipidemia   Essential hypertension    Asthma   Bipolar 1 disorder (HCC)   Grade I diastolic dysfunction   Class 2 obesity   CKD stage 3b, GFR 30-44 ml/min (HCC)   Normocytic anemia   GERD (gastroesophageal reflux disease)   Major depression   Schizoaffective disorder (HCC)   Rhabdomyolysis    Plan   ## Psychiatric Medication Recommendations:  -Resume home medications once medically stable -No current medicaitons at this time.   ## Medical Decision Making Capacity: Not specifically addressed in this encounter  ## Further Work-up:  --  TSH, B12, folate, EKG, While pt on Qtc prolonging medications, please monitor & replete K+ to 4 and Mg2+ to 2, TOC consult for substance abuse resources, U/A, or UDS -- most recent EKG on 02/19/24 had QtC of 450 -- Pertinent labwork reviewed earlier this admission includes:    ## Disposition:-- We recommend inpatient psychiatric hospitalization after medical hospitalization. Patient has been involuntarily committed on 02/18/2024.   ## Behavioral / Environmental: -Utilize compassion and acknowledge the patient's experiences while setting clear and realistic expectations for care.    ## Safety and Observation Level:  - Based on my clinical evaluation, I estimate the patient to be at low risk of self harm in the current setting. - At this time, we recommend  1:1 Observation. This decision is based on my review of the chart including patient's history and current presentation, interview of the patient, mental status examination, and consideration of suicide risk including evaluating suicidal ideation, plan, intent, suicidal or self-harm behaviors, risk factors, and protective factors. This judgment is based on our ability to directly address suicide risk, implement suicide prevention strategies, and  develop a safety plan while the patient is in the clinical setting. Please contact our team if there is a concern that risk level has changed.  CSSR Risk Category:C-SSRS RISK CATEGORY: No  Risk  Suicide Risk Assessment: Patient has following modifiable risk factors for suicide: active mental illness (to encompass adhd, tbi, mania, psychosis, trauma reaction), which we are addressing by inpatient psychiatric admission. Patient has following non-modifiable or demographic risk factors for suicide: psychiatric hospitalization Patient has the following protective factors against suicide: Access to outpatient mental health care, Supportive family, Cultural, spiritual, or religious beliefs that discourage suicide, and no history of suicide attempts  Thank you for this consult request. Recommendations have been communicated to the primary team.  We will continue to follow at this time.   Sharlot Becker, NP       History of Present Illness  Relevant Aspects of [facility] Course:  Admitted on 02/18/2024 for AKI. Heidi Young is a 78 y.o. female with medical history significant of asthma, psychosis, schizophrenia, depression, bipolar 1 disorder, renal cell carcinoma, Takotsubo cardiomyopathy, left carotid bruit, chronic anemia, osteoarthritis, fibromyalgia, GERD, nephrolithiasis, hyperlipidemia, lumbar herniated disc, hypertension, heart murmur, chest pain please who presented to the emergency department with complaints of dyspnea, generalized weakness, arthralgias who presented to the emergency department who was admitted from 02/10/2024 until 02/13/2024 due to chest pain who presented to the emergency department BIBEMS after being [facility] by her son due to self harming behaviors and the delusion that she is having irritation over her skin from Voodoo.  She is still somnolent from earlier haloperidol  administration.  She is able to answer simple questions.  She denied headache, chest, back or abdominal pain at this time.  She does not feel nauseous.   Patient Report:  She states that she was in the house and needed to leave because her skin was on fire. Once outside she realized she left  the blanket and called 911 to tell them her skin felt like it was burning. During this time she does remember having a disagreement with her nosey neighbor. She reports over the past 2 weeks she has noticed increasing irritability, agitation and anger. She denies any trauma or triggers with direct correlation to the mood swings. She reports compliance with her medications. She has is open to inpatient psychiatric treatment. She denies suicidal ideation, homicidal ideations. She denies psychosis, paranoia, and hallucinations at this time.   02/21/2024: The client engaged easily in conversation.  She reported she is mentally good but not good in some areas with anger and dark thoughts.  Her depression fluctuates from mild to high with dark thoughts creeping in at times.  That's why I called the police, I was a danger to myself.  She reported having visual hallucinations of bugs in her home until she cleaned her house.  Auditory hallucinations of a voice singing at times.  Her sleep is going very well, appetite is tremendous, I always enjoyed food.  Then she segued into how voodoo is effecting her life and she felt better in [place] because it was a higher altitude.  I didn't have schizophrenia until the voodoo came into my life and then showed me the darker colored skin on her face that the voodoo caused and how she needs the lighter shades to take over and rid the voodoo.  Prior to hospitalization, she was living alone with her son five minutes away.    Psych ROS:  Depression: yes Anxiety:  none noted in chart  Mania (lifetime and current): Yes, some recent anger, agitation, and irritability Psychosis: (lifetime and current): skin burning  Collateral information:  Attempted to contact son, who initiated IVC unsuccessful.   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  Vital Signs:  Temp:  [98.3 F (36.8 C)-98.8 F (37.1 C)] 98.3 F (36.8 C) (09/05 1323) Pulse Rate:  [77-84] 77 (09/05 1323) Resp:  [17-20] 20 (09/05 0619) BP: (134-166)/(60-84) 166/84 (09/05 1323) SpO2:  [99 %-100 %] 99 % (09/05 1323) Blood pressure (!) 166/84, pulse 77, temperature 98.3 F (36.8 C), temperature source Oral, resp. rate 20, height 5' 7 (1.702 m), weight 113.4 kg, SpO2 99%. Body mass index is 39.16 kg/m.  Physical Exam Vitals and nursing note reviewed.  Constitutional:      Appearance: Normal appearance. She is obese.  Neurological:     General: No focal deficit present.     Mental Status: She is alert and oriented to person, place, and time. Mental status is at baseline.  Psychiatric:        Mood and Affect: Mood normal.        Behavior: Behavior normal.        Thought Content: Thought content normal.        Judgment: Judgment normal.     Mental Status Exam: General Appearance: Disheveled  Orientation:  Full (Time, Place, and Person)  Memory:  Recent;   Fair Remote;   Poor   Concentration:  Concentration: Good and Attention Span: Good  Recall:  Fair  Attention  Good  Eye Contact:  Good  Speech:  Clear and Coherent  Language:  Good  Volume:  Normal  Mood: Pleasant  Affect:  Appropriate  Thought Process:  Linear  Thought Content:  WDL  Suicidal Thoughts:  No  Homicidal Thoughts:  No  Judgement:  Impaired  Insight:  Fair  Psychomotor Activity:  EPS and TD  Akathisia:  Yes  Fund of Knowledge:  Fair      Assets:  Manufacturing systems engineer Desire for Improvement Housing Social Support  Cognition:  Impaired,  Mild  ADL's:  Impaired  AIMS (if indicated):        Other History   These have been pulled in through the EMR, reviewed, and updated if appropriate.  Family History:  The patient's family history includes Aortic stenosis in an other family member; Brain cancer (age of onset: 67) in her father; Coronary artery disease (age of onset: 57) in her mother; Heart failure in her mother; Hypertension in an other family member; Stroke in her mother.  Medical History: Past Medical History:  Diagnosis Date   Asthma    Bipolar 1 disorder (HCC)    Cancer (HCC)    renal cell carcinoma   Cardiomyopathy    Carotid bruit    left   Chest pain    Chronic anemia    DJD (degenerative joint disease)    Fibromyalgia    GERD (gastroesophageal reflux disease)    on rare occas. - uses peptobismol   Heart murmur    History of kidney cancer    unknown type. had R kidney removed b/c of CA.    HLD (hyperlipidemia)    HTN (hypertension)    Kidney stones    Lumbar herniated disc    Obesity    Psychosis (HCC)    Schizophrenia (HCC)    Single kidney    Takotsubo cardiomyopathy    UTI (lower urinary tract infection)     Surgical History: Past Surgical History:  Procedure Laterality Date   BREAST BIOPSY Right 10/24/2022   US  RT BREAST BX W LOC DEV 1ST LESION IMG BX SPEC US  GUIDE 10/24/2022 [facility] MAMMOGRAPHY   BREAST LUMPECTOMY  90's   left, cyst   CARDIAC  CATHETERIZATION  2010   no intervention   CESAREAN SECTION  1980   x1   CYSTECTOMY  90's   back (left)   NEPHRECTOMY  1990's   right, secondary to cyst   TOTAL KNEE ARTHROPLASTY  2005   right   TOTAL KNEE ARTHROPLASTY Left 03/25/2013   Procedure: TOTAL KNEE ARTHROPLASTY;  Surgeon: Norleen LITTIE Gavel, MD;  Location: [facility] OR;  Service: Orthopedics;  Laterality: Left;     Medications:   Current Facility-Administered Medications:    0.45 % sodium chloride  infusion, , Intravenous, Continuous, Elpidio Reyes DEL, MD, Last Rate: 100 mL/hr at 02/21/24 1028, New Bag at 02/21/24 1028   acetaminophen  (TYLENOL ) tablet 650 mg, 650 mg, Oral, Q6H PRN, 650 mg at 02/19/24 1137 **OR** acetaminophen  (TYLENOL ) suppository 650 mg, 650 mg, Rectal, Q6H PRN, Celinda Alm Lot, MD   albuterol  (PROVENTIL ) (2.5 MG/3ML) 0.083% nebulizer solution 3 mL, 3 mL, Inhalation, 5 X Daily PRN, Elpidio Reyes DEL, MD   amLODipine  (NORVASC ) tablet 10 mg, 10 mg, Oral, Daily, Celinda Alm Lot, MD, 10 mg at 02/21/24 0900   fluticasone  furoate-vilanterol (BREO ELLIPTA ) 100-25 MCG/ACT 1 puff, 1 puff, Inhalation, Daily, Celinda Alm Lot, MD, 1 puff at 02/21/24 0747   folic acid  (FOLVITE ) tablet 1 mg, 1 mg, Oral, Daily, Ortiz, David Manuel, MD, 1 mg at 02/21/24 0900   gabapentin  (NEURONTIN ) capsule 300 mg, 300 mg, Oral, Q8H, Celinda Alm Lot, MD, 300 mg at 02/21/24 1319   haloperidol  lactate (HALDOL ) injection 5 mg, 5 mg, Intravenous, Q6H PRN, Celinda Alm Lot, MD   metoprolol  tartrate (LOPRESSOR ) tablet 50 mg, 50 mg, Oral, Q8H, Celinda Alm Lot, MD, 50 mg at 02/21/24 1319   mirabegron  ER (MYRBETRIQ ) tablet 25 mg, 25 mg, Oral, Daily PRN, Celinda Alm Lot, MD   mirtazapine  (REMERON ) tablet 45 mg, 45 mg, Oral, QHS, Celinda Alm Lot, MD, 45 mg at 02/20/24 2108   ondansetron  (ZOFRAN ) tablet 4 mg, 4 mg, Oral, Q6H PRN **OR** ondansetron  (ZOFRAN ) injection 4 mg, 4 mg, Intravenous, Q6H PRN, Celinda Alm Lot, MD    pantoprazole  (PROTONIX ) EC tablet 40 mg, 40 mg, Oral, Daily, Celinda Alm Lot, MD, 40 mg at 02/21/24 0900   QUEtiapine  (SEROQUEL  XR) 24 hr tablet 150 mg, 150 mg, Oral, QHS, Celinda Alm Lot, MD, 150 mg at  02/20/24 2108   traMADol  (ULTRAM ) tablet 50 mg, 50 mg, Oral, Q8H PRN, Celinda Alm Lot, MD, 50 mg at 02/21/24 1032  Allergies: Allergies  Allergen Reactions   Bee Venom Anaphylaxis   Other Anaphylaxis    Red ant's, roaches    Avelox [Moxifloxacin Hydrochloride] Rash and Other (See Comments)    Fever / Upset Stomach   Nsaids Other (See Comments)    Due to stomach ulcers   Ciprofloxacin Other (See Comments)    Upset stomach    Sharlot Becker, NP

## 2024-02-22 DIAGNOSIS — N179 Acute kidney failure, unspecified: Secondary | ICD-10-CM | POA: Diagnosis not present

## 2024-02-22 LAB — COMPREHENSIVE METABOLIC PANEL WITH GFR
ALT: 45 U/L — ABNORMAL HIGH (ref 0–44)
AST: 32 U/L (ref 15–41)
Albumin: 3.7 g/dL (ref 3.5–5.0)
Alkaline Phosphatase: 81 U/L (ref 38–126)
Anion gap: 13 (ref 5–15)
BUN: 20 mg/dL (ref 8–23)
CO2: 19 mmol/L — ABNORMAL LOW (ref 22–32)
Calcium: 10 mg/dL (ref 8.9–10.3)
Chloride: 105 mmol/L (ref 98–111)
Creatinine, Ser: 1.35 mg/dL — ABNORMAL HIGH (ref 0.44–1.00)
GFR, Estimated: 40 mL/min — ABNORMAL LOW (ref >60)
Glucose, Bld: 91 mg/dL (ref 70–99)
Potassium: 4.1 mmol/L (ref 3.5–5.1)
Sodium: 138 mmol/L (ref 135–145)
Total Bilirubin: 0.6 mg/dL (ref 0.0–1.2)
Total Protein: 6.4 g/dL — ABNORMAL LOW (ref 6.5–8.1)

## 2024-02-22 LAB — CBC
HCT: 31.3 % — ABNORMAL LOW (ref 36.0–46.0)
Hemoglobin: 8.8 g/dL — ABNORMAL LOW (ref 12.0–15.0)
MCH: 25.7 pg — ABNORMAL LOW (ref 26.0–34.0)
MCHC: 28.1 g/dL — ABNORMAL LOW (ref 30.0–36.0)
MCV: 91.5 fL (ref 80.0–100.0)
Platelets: 342 K/uL (ref 150–400)
RBC: 3.42 MIL/uL — ABNORMAL LOW (ref 3.87–5.11)
RDW: 14.5 % (ref 11.5–15.5)
WBC: 6.4 K/uL (ref 4.0–10.5)
nRBC: 0 % (ref 0.0–0.2)

## 2024-02-22 LAB — CK: Total CK: 306 U/L — ABNORMAL HIGH (ref 38–234)

## 2024-02-22 MED ORDER — HYDRALAZINE HCL 20 MG/ML IJ SOLN: 5.0000 mg | Freq: Four times a day (QID) | INTRAMUSCULAR | Status: DC | PRN

## 2024-02-22 NOTE — Progress Notes (Signed)
 PROGRESS NOTE  Heidi Young  FMW:[id number] DOB: November 16, 1945 DOA: 02/18/2024 PCP: [name], MD  Consultants  Brief Narrative: 78 y.o. female with medical history significant of asthma, psychosis, schizophrenia, depression, bipolar 1 disorder, renal cell carcinoma, Takotsubo cardiomyopathy, left carotid bruit, chronic anemia, osteoarthritis, fibromyalgia, GERD, nephrolithiasis, hyperlipidemia, lumbar herniated disc, hypertension, heart murmur, chest pain please who presented to the emergency department with complaints of dyspnea, generalized weakness, arthralgias who presented to the emergency department who was admitted from 02/10/2024 until 02/13/2024 due to chest pain who presented to the emergency department via EMS after being IVC'ed by her son due to self harming behaviors and the delusion that she is having irritation over her skin from Voodoo.  Admitted to [facility] for same.    Assessment & Plan:   AKI (acute kidney injury) on CKD stage 3b, GFR 30-44 ml/min - 2/2 to rhabdo below. -Creatinine today is 1.35, up from 1.3.   - The last labs we have on her prior to this admission were from 2015, which showed creatinine/GFR in stage III CKD class.  She is now back to that baseline. - Will continue with just oral rehydration.   - If creatinine remains in normal range home oral rehydration then she should likely be medically ready for discharge 9/7 tomorrow.  Rhabdomyolysis -CK continues to downtrend. -LFTs also downtrending, ALT is 45 and I presume this minimal elevation is secondary to fatty liver disease. - Can stop trending CK and move to just oral rehydration  Metabolic acidosis: - Now nonanion gap. -Most likely secondary to what appears to be longstanding stage III kidney disease. - Defer treatment to outpatient neurology, recommend referral to neurology outpatient on discharge.   Bipolar 1 disorder (HCC) Major depression Schizoaffective disorder (HCC) -Reason for initial  presentation to the emergency department. -Psych following.   Continue mirtazapine  45 mg p.o. bedtime. Continue Seroquel  150 mg p.o. bedtime. Continue haloperidol  5 mg IVP every 6 hours as needed--> has been very stable and has not needed Plan will be for continuing home psych medications and recommend for inpatient psychiatric hospitalization after medically cleared.  Patient and son both agree with plan If she continues in her current trend likely be medically cleared tomorrow 9/7.     Hyperlipidemia Hold statin due to rhabdomyolysis. Restart at discharge     Essential hypertension Continue amlodipine  10 mg p.o. daily. Continue metoprolol  tartrate 50 mg p.o. twice daily. BP is acceptable here overall, she did have some elevated systolics to 166 yesterday. Hesitant to add any renally acting medications like ACE/ARB or thiazide in light of her recent dehydration and AKI.  Will add hydralazine  as needed and can follow-up outpatient for any further blood pressure adjustments.   Asthma Continue Dulera  1 Inh daily or formulary equivalent.     Grade I diastolic dysfunction Continue beta-blocker twice daily.     Class 2 obesity Current BMI 39.16 kg/m. Would benefit from lifestyle modifications. Follow-up closely with PCP.     Normocytic anemia Monitor hematocrit and hemoglobin.  Presume chronic anemia secondary to longstanding renal disease.     GERD (gastroesophageal reflux disease) Continue pantoprazole  40 mg p.o. daily.   DVT prophylaxis:  SCDs Start: 02/19/24 0809  Code Status:   Code Status: Full Code Family Communication: Son at bedside and all questions answered Level of care: Med-Surg Status is: Inpatient Dispo: Inpatient while we treat her AKI and rhabdomyolysis--> likely medically ready for discharge Sunday 9/7  Subjective: No complaints or concerns today, reports continuing to feel  very well.  Eating and drinking well.  Objective: Vitals:   02/21/24 1319  02/21/24 1323 02/21/24 1900 02/22/24 0614  BP: (!) 166/84 (!) 166/84 (!) 144/85 (!) 140/68  Pulse: 84 77  66  Resp:   18 18  Temp:  98.3 F (36.8 C) 99.3 F (37.4 C) 98.5 F (36.9 C)  TempSrc:  Oral Oral Oral  SpO2:  99% 97% 99%  Weight:      Height:        Intake/Output Summary (Last 24 hours) at 02/22/2024 0750 Last data filed at 02/22/2024 0500 Gross per 24 hour  Intake 4772.86 ml  Output 850 ml  Net 3922.86 ml   Filed Weights   02/18/24 2254  Weight: 113.4 kg   Body mass index is 39.16 kg/m.  Gen: 78 y.o. female in no apparent distress.  Nontoxic Pulm: Non-labored breathing.  Clear to auscultation bilaterally.  CV: Regular rate and rhythm. Grade II/VI SEM noted.  GI: Abdomen soft, non-tender, non-distended, with normoactive bowel sounds. No organomegaly or masses felt. Ext: Warm, no deformities, obese legs but no pitting edema and not tightness/tenderness consistent with compartment syndrome.  Skin: No rashes, lesions no ulcers Neuro: Alert and oriented. No focal neurological deficits. Psych: Calm     I have personally reviewed the following labs and images: CBC: Recent Labs  Lab 02/19/24 0323 02/20/24 0601 02/21/24 0358 02/22/24 0500  WBC 8.4 6.4 6.5 6.4  NEUTROABS 5.2  --   --   --   HGB 9.2* 8.8* 8.1* 8.8*  HCT 32.0* 30.8* 28.4* 31.3*  MCV 90.1 91.1 91.9 91.5  PLT 286 311 299 342   BMP &GFR Recent Labs  Lab 02/19/24 0323 02/20/24 0601 02/21/24 0358 02/22/24 0500  NA 145 141 139 138  K 4.1 3.9 4.1 4.1  CL 107 108 107 105  CO2 21* 21* 21* 19*  GLUCOSE 93 98 89 91  BUN 38* 27* 21 20  CREATININE 2.10* 1.47* 1.30* 1.35*  CALCIUM  9.7 9.2 9.4 10.0   Estimated Creatinine Clearance: 44.6 mL/min (A) (by C-G formula based on SCr of 1.35 mg/dL (H)). Liver & Pancreas: Recent Labs  Lab 02/19/24 0323 02/20/24 0601 02/21/24 0358 02/22/24 0500  AST 48* 55* 35 32  ALT 40 56* 46* 45*  ALKPHOS 82 81 72 81  BILITOT 0.8 0.6 0.5 0.6  PROT 6.8 6.4* 5.9*  6.4*  ALBUMIN  3.9 3.7 3.5 3.7   No results for input(s): LIPASE, AMYLASE in the last 168 hours. Recent Labs  Lab 02/19/24 0323  AMMONIA 28   Diabetic: No results for input(s): HGBA1C in the last 72 hours. No results for input(s): GLUCAP in the last 168 hours. Cardiac Enzymes: Recent Labs  Lab 02/19/24 0323 02/20/24 0601 02/21/24 0358 02/22/24 0500  CKTOTAL 1,083* 705* 471* 306*   Recent Labs    02/11/24 0000  PROBNP 401.0*   Coagulation Profile: No results for input(s): INR, PROTIME in the last 168 hours. Thyroid  Function Tests: No results for input(s): TSH, T4TOTAL, FREET4, T3FREE, THYROIDAB in the last 72 hours. Lipid Profile: No results for input(s): CHOL, HDL, LDLCALC, TRIG, CHOLHDL, LDLDIRECT in the last 72 hours. Anemia Panel: No results for input(s): VITAMINB12, FOLATE, FERRITIN, TIBC, IRON, RETICCTPCT in the last 72 hours. Urine analysis:    Component Value Date/Time   COLORURINE STRAW (A) 02/20/2024 1357   APPEARANCEUR CLEAR 02/20/2024 1357   LABSPEC 1.008 02/20/2024 1357   PHURINE 5.0 02/20/2024 1357   GLUCOSEU NEGATIVE 02/20/2024 1357   HGBUR  NEGATIVE 02/20/2024 1357   BILIRUBINUR NEGATIVE 02/20/2024 1357   KETONESUR NEGATIVE 02/20/2024 1357   PROTEINUR NEGATIVE 02/20/2024 1357   UROBILINOGEN 0.2 03/23/2014 0710   NITRITE NEGATIVE 02/20/2024 1357   LEUKOCYTESUR SMALL (A) 02/20/2024 1357   Sepsis Labs: Invalid input(s): PROCALCITONIN, LACTICIDVEN  Microbiology: Recent Results (from the past 240 hours)  Urine Culture     Status: Abnormal   Collection Time: 02/20/24  1:57 PM   Specimen: Urine, Random  Result Value Ref Range Status   Specimen Description   Final    URINE, RANDOM Performed at [facility], [address] [place], [place] [address]    Special Requests   Final    NONE Reflexed from [id number] Performed at [facility], [address],  [place], [place] [address]    Culture (A)  Final    <10,000 COLONIES/mL INSIGNIFICANT GROWTH Performed at [facility], [address], [place], [place] [address]    Report Status 02/21/2024 FINAL  Final    Radiology Studies: No results found.  Scheduled Meds:  amLODipine   10 mg Oral Daily   fluticasone  furoate-vilanterol  1 puff Inhalation Daily   folic acid   1 mg Oral Daily   gabapentin   300 mg Oral Q8H   metoprolol  tartrate  50 mg Oral Q8H   mirtazapine   45 mg Oral QHS   pantoprazole   40 mg Oral Daily   QUEtiapine  Fumarate  150 mg Oral QHS   Continuous Infusions:  sodium chloride  100 mL/hr at 02/21/24 1504     LOS: 3 days   35 minutes with more than 50% spent in reviewing records, counseling patient/family and coordinating care.  Reyes VEAR Gaw, MD [facility] [email or web address] 02/22/2024, 7:50 AM

## 2024-02-22 NOTE — Plan of Care (Signed)

## 2024-02-22 NOTE — TOC Progression Note (Addendum)
 Transition of Care [facility]) - Progression Note    Patient Details  Name: Heidi Young MRN: [id number] Date of Birth: 08/11/1945  Transition of Care [facility]) CM/SW Contact  Sonda Manuella Quill, RN Phone Number: 02/22/2024, 11:10 AM  Clinical Narrative:    Notified by Dr Elpidio pt not medically ready for d/c today; TOC following.  -[id number]- call from [facility]; she bed was offered, and requested ETA; she was updated pt not medically ready for d/c today; she said bed cannot be held, and new referral will need to be place when pt ready for d/c; Dr Elpidio notified via secure chat.  Expected Discharge Plan: Home w Home Health Services Barriers to Discharge: Continued Medical Work up, Other (must enter comment) (IVC)               Expected Discharge Plan and Services In-house Referral: NA Discharge Planning Services: CM Consult Post Acute Care Choice: NA Living arrangements for the past 2 months: Apartment                 DME Arranged: N/A DME Agency: NA       HH Arranged: NA HH Agency: NA         Social Drivers of Health (SDOH) Interventions SDOH Screenings   Food Insecurity: No Food Insecurity (02/19/2024)  Recent Concern: Food Insecurity - Food Insecurity Present (02/11/2024)  Housing: Low Risk  (02/19/2024)  Transportation Needs: No Transportation Needs (02/19/2024)  Utilities: Not At Risk (02/19/2024)  Social Connections: Moderately Isolated (02/19/2024)  Tobacco Use: Unknown (02/18/2024)    Readmission Risk Interventions    02/20/2024    4:47 PM  Readmission Risk Prevention Plan  Transportation Screening Complete  PCP or Specialist Appt within 3-5 Days Complete  HRI or Home Care Consult Complete  Social Work Consult for Recovery Care Planning/Counseling Complete  Palliative Care Screening Not Applicable  Medication Review Oceanographer) Complete

## 2024-02-23 DIAGNOSIS — N179 Acute kidney failure, unspecified: Secondary | ICD-10-CM | POA: Diagnosis not present

## 2024-02-23 LAB — BASIC METABOLIC PANEL WITH GFR
Anion gap: 12 (ref 5–15)
BUN: 19 mg/dL (ref 8–23)
CO2: 23 mmol/L (ref 22–32)
Calcium: 10.5 mg/dL — ABNORMAL HIGH (ref 8.9–10.3)
Chloride: 103 mmol/L (ref 98–111)
Creatinine, Ser: 1.37 mg/dL — ABNORMAL HIGH (ref 0.44–1.00)
GFR, Estimated: 39 mL/min — ABNORMAL LOW (ref >60)
Glucose, Bld: 94 mg/dL (ref 70–99)
Potassium: 4.2 mmol/L (ref 3.5–5.1)
Sodium: 139 mmol/L (ref 135–145)

## 2024-02-23 LAB — SARS CORONAVIRUS 2 BY RT PCR: SARS Coronavirus 2 by RT PCR: NEGATIVE

## 2024-02-23 LAB — CK: Total CK: 267 U/L — ABNORMAL HIGH (ref 38–234)

## 2024-02-23 MED ORDER — SENNOSIDES-DOCUSATE SODIUM 8.6-50 MG PO TABS: 2.0000 | ORAL_TABLET | Freq: Every day | ORAL | Status: DC | PRN

## 2024-02-23 MED ORDER — POLYETHYLENE GLYCOL 3350 17 G PO PACK: 17.0000 g | PACK | Freq: Two times a day (BID) | ORAL | 0 refills | Status: AC

## 2024-02-23 MED ORDER — POLYETHYLENE GLYCOL 3350 17 G PO PACK
17.0000 g | PACK | Freq: Every day | ORAL | Status: DC | PRN
  Administered 2024-02-23: 17 g via ORAL
  Filled 2024-02-23: qty 1

## 2024-02-23 MED ORDER — BISACODYL 10 MG RE SUPP
10.0000 mg | Freq: Every day | RECTAL | 0 refills | Status: AC | PRN
Start: 2024-02-23 — End: ?

## 2024-02-23 MED ORDER — SENNOSIDES-DOCUSATE SODIUM 8.6-50 MG PO TABS
2.0000 | ORAL_TABLET | ORAL | Status: AC
  Administered 2024-02-23: 2 via ORAL
  Filled 2024-02-23: qty 2

## 2024-02-23 MED ORDER — POLYETHYLENE GLYCOL 3350 17 G PO PACK
17.0000 g | PACK | Freq: Two times a day (BID) | ORAL | Status: DC
  Administered 2024-02-23: 17 g via ORAL
  Filled 2024-02-23 (×2): qty 1

## 2024-02-23 MED ORDER — BISACODYL 10 MG RE SUPP: 10.0000 mg | Freq: Every day | RECTAL | Status: DC | PRN

## 2024-02-23 NOTE — TOC Progression Note (Signed)
 Transition of Care [facility]) - Progression Note    Patient Details  Name: Heidi Young MRN: [id number] Date of Birth: [birth year]  Transition of Care [facility]) CM/SW Contact  Sonda Manuella Quill, RN Phone Number: 02/23/2024, 12:29 PM  Clinical Narrative:    Pt ready to d/c to [facility]; pt accept at [facility] RM # [id number], call report # [phone]; transport by [facility]; LVM for Carin Righter Clipstein [phone]) at [phone]; awaiting return call.   Expected Discharge Plan: Home w Home Health Services Barriers to Discharge: Continued Medical Work up, Other (must enter comment) (IVC)               Expected Discharge Plan and Services In-house Referral: NA Discharge Planning Services: CM Consult Post Acute Care Choice: NA Living arrangements for the past 2 months: Apartment Expected Discharge Date: 02/23/24               DME Arranged: N/A DME Agency: NA       HH Arranged: NA HH Agency: NA         Social Drivers of Health (SDOH) Interventions SDOH Screenings   Food Insecurity: No Food Insecurity (02/19/2024)  Recent Concern: Food Insecurity - Food Insecurity Present (02/11/2024)  Housing: Low Risk  (02/19/2024)  Transportation Needs: No Transportation Needs (02/19/2024)  Utilities: Not At Risk (02/19/2024)  Social Connections: Moderately Isolated (02/19/2024)  Tobacco Use: Unknown (02/18/2024)    Readmission Risk Interventions    02/20/2024    4:47 PM  Readmission Risk Prevention Plan  Transportation Screening Complete  PCP or Specialist Appt within 3-5 Days Complete  HRI or Home Care Consult Complete  Social Work Consult for Recovery Care Planning/Counseling Complete  Palliative Care Screening Not Applicable  Medication Review Oceanographer) Complete

## 2024-02-23 NOTE — Plan of Care (Signed)

## 2024-02-23 NOTE — Progress Notes (Addendum)
 PROGRESS NOTE  Heidi Young  FMW:[id number] DOB: 1945/07/22 DOA: 02/18/2024 PCP: [name], MD  Consultants  Brief Narrative: 78 y.o. female with medical history significant of asthma, psychosis, schizophrenia, depression, bipolar 1 disorder, renal cell carcinoma, Takotsubo cardiomyopathy, left carotid bruit, chronic anemia, osteoarthritis, fibromyalgia, GERD, nephrolithiasis, hyperlipidemia, lumbar herniated disc, hypertension, heart murmur, chest pain please who presented to the emergency department with complaints of dyspnea, generalized weakness, arthralgias who presented to the emergency department who was admitted from 02/10/2024 until 02/13/2024 due to chest pain who presented to the emergency department via EMS after being IVC'ed by her son due to self harming behaviors and the delusion that she is having irritation over her skin from Voodoo.  Admitted to [facility] for same.  Treated with IVF and noted decreasing CK and SCr.  Turned off IVF 9/6 and she did well with oral hydration.  Medically ready for DC, awaiting word from inpt psych unit for transfer.      Assessment & Plan: AKI (acute kidney injury) on CKD stage 3b, GFR 30-44 ml/min - 2/2 to rhabdo below. -Creatinine essentially stable past several days.   - The last labs we have on her prior to this admission were from 2015, which showed creatinine/GFR in stage III CKD class.  She is now back to that baseline. - Will continue with just oral rehydration.   - CK and SCr both stable on oral hydration, now medically ready for DC to inpt psych facility.    Rhabdomyolysis -CK continues to downtrend-->267 today.  No need to check any further. -LFTs have also downtrending, ALT is 45 and I presume this minimal elevation is secondary to fatty liver disease. - continue oral rehydration.  Metabolic acidosis: - now resolved.  -Most likely secondary to what appears to be longstanding stage III kidney disease. - Defer treatment to outpatient  neurology, recommend referral to neurology outpatient on discharge.   Bipolar 1 disorder (HCC) Major depression Schizoaffective disorder (HCC) -Reason for initial presentation to the emergency department. -Psych following.   Continue mirtazapine  45 mg p.o. bedtime. Continue Seroquel  150 mg p.o. bedtime. Continue haloperidol  5 mg IVP every 6 hours as needed--> has been very stable and has not needed Plan will be for continuing home psych medications and recommend for inpatient psychiatric hospitalization after medically cleared.  Patient and son both agree with plan - Medically cleared for transfer when bed available as of today 9/7.  Constipation: - hasn't been able to have a bowel movement.  - encouraged ambulation - adding BID miralax  up from daily PRN, senna, suppository as needed.    Hyperlipidemia Hold statin due to rhabdomyolysis. Can restart at discharge     Essential hypertension Continue amlodipine  10 mg p.o. daily. Continue metoprolol  tartrate 50 mg p.o. twice daily. BP is acceptable here overall, has had a few higher systolics over past couple of days Hesitant to add any renally acting medications like ACE/ARB or thiazide in light of her recent dehydration and AKI.  Hydralazine  added as needed and can follow-up outpatient for any further blood pressure adjustments.   Asthma Continue Dulera  1 Inh daily or formulary equivalent.   Grade I diastolic dysfunction Continue beta-blocker twice daily.   Class 2 obesity Current BMI 39.16 kg/m. Would benefit from lifestyle modifications. Follow-up closely with PCP.   Normocytic anemia Stable.  Monitor hematocrit and hemoglobin.  Presume chronic anemia secondary to longstanding renal disease.   GERD (gastroesophageal reflux disease) Continue pantoprazole  40 mg p.o. daily.   DVT prophylaxis:  SCDs Start: 02/19/24 0809  Code Status:   Code Status: Full Code Family Communication: Son at bedside and all questions  answered Level of care: Med-Surg Status is: Inpatient Dispo: Medically ready for DC, awaiting inpt psych facility bed  Subjective: No complaints or concerns today, continues o feel very well.  Eating and drinking well.  Objective: Vitals:   02/22/24 1414 02/22/24 2059 02/22/24 2335 02/23/24 0408  BP: (!) 152/67 (!) 178/91 (!) 151/73 (!) 156/75  Pulse: 69 87 79 71  Resp:  17 18 17   Temp:  99.3 F (37.4 C) 98.8 F (37.1 C) 98.3 F (36.8 C)  TempSrc:      SpO2:  98% 99% 95%  Weight:      Height:        Intake/Output Summary (Last 24 hours) at 02/23/2024 0849 Last data filed at 02/23/2024 9375 Gross per 24 hour  Intake 2640 ml  Output --  Net 2640 ml   Filed Weights   02/18/24 2254  Weight: 113.4 kg   Body mass index is 39.16 kg/m.  Gen: 78 y.o. female in no apparent distress.  Nontoxic Pulm: Non-labored breathing.  Clear to auscultation bilaterally.  CV: Regular rate and rhythm. Grade II/VI SEM noted.  GI: Abdomen soft, non-tender, non-distended, with normoactive bowel sounds. No organomegaly or masses felt. Ext: Warm, no deformities, obese legs but no pitting edema and not tightness/tenderness consistent with compartment syndrome.  Skin: No rashes, lesions no ulcers Neuro: Alert and oriented. No focal neurological deficits. Psych: Calm, mood and affect normal.    I have personally reviewed the following labs and images: CBC: Recent Labs  Lab 02/19/24 0323 02/20/24 0601 02/21/24 0358 02/22/24 0500  WBC 8.4 6.4 6.5 6.4  NEUTROABS 5.2  --   --   --   HGB 9.2* 8.8* 8.1* 8.8*  HCT 32.0* 30.8* 28.4* 31.3*  MCV 90.1 91.1 91.9 91.5  PLT 286 311 299 342   BMP &GFR Recent Labs  Lab 02/19/24 0323 02/20/24 0601 02/21/24 0358 02/22/24 0500 02/23/24 0640  NA 145 141 139 138 139  K 4.1 3.9 4.1 4.1 4.2  CL 107 108 107 105 103  CO2 21* 21* 21* 19* 23  GLUCOSE 93 98 89 91 94  BUN 38* 27* 21 20 19   CREATININE 2.10* 1.47* 1.30* 1.35* 1.37*  CALCIUM  9.7 9.2 9.4 10.0  10.5*   Estimated Creatinine Clearance: 44 mL/min (A) (by C-G formula based on SCr of 1.37 mg/dL (H)). Liver & Pancreas: Recent Labs  Lab 02/19/24 0323 02/20/24 0601 02/21/24 0358 02/22/24 0500  AST 48* 55* 35 32  ALT 40 56* 46* 45*  ALKPHOS 82 81 72 81  BILITOT 0.8 0.6 0.5 0.6  PROT 6.8 6.4* 5.9* 6.4*  ALBUMIN  3.9 3.7 3.5 3.7   No results for input(s): LIPASE, AMYLASE in the last 168 hours. Recent Labs  Lab 02/19/24 0323  AMMONIA 28   Diabetic: No results for input(s): HGBA1C in the last 72 hours. No results for input(s): GLUCAP in the last 168 hours. Cardiac Enzymes: Recent Labs  Lab 02/19/24 0323 02/20/24 0601 02/21/24 0358 02/22/24 0500 02/23/24 0640  CKTOTAL 1,083* 705* 471* 306* 267*   Recent Labs    02/11/24 0000  PROBNP 401.0*   Coagulation Profile: No results for input(s): INR, PROTIME in the last 168 hours. Thyroid  Function Tests: No results for input(s): TSH, T4TOTAL, FREET4, T3FREE, THYROIDAB in the last 72 hours. Lipid Profile: No results for input(s): CHOL, HDL, LDLCALC, TRIG, CHOLHDL, LDLDIRECT  in the last 72 hours. Anemia Panel: No results for input(s): VITAMINB12, FOLATE, FERRITIN, TIBC, IRON, RETICCTPCT in the last 72 hours. Urine analysis:    Component Value Date/Time   COLORURINE STRAW (A) 02/20/2024 1357   APPEARANCEUR CLEAR 02/20/2024 1357   LABSPEC 1.008 02/20/2024 1357   PHURINE 5.0 02/20/2024 1357   GLUCOSEU NEGATIVE 02/20/2024 1357   HGBUR NEGATIVE 02/20/2024 1357   BILIRUBINUR NEGATIVE 02/20/2024 1357   KETONESUR NEGATIVE 02/20/2024 1357   PROTEINUR NEGATIVE 02/20/2024 1357   UROBILINOGEN 0.2 03/23/2014 0710   NITRITE NEGATIVE 02/20/2024 1357   LEUKOCYTESUR SMALL (A) 02/20/2024 1357   Sepsis Labs: Invalid input(s): PROCALCITONIN, LACTICIDVEN  Microbiology: Recent Results (from the past 240 hours)  Urine Culture     Status: Abnormal   Collection Time: 02/20/24  1:57 PM    Specimen: Urine, Random  Result Value Ref Range Status   Specimen Description   Final    URINE, RANDOM Performed at [facility], [address] [place], [place] [address]    Special Requests   Final    NONE Reflexed from [id number] Performed at [facility], [address] [place], [place] [address]    Culture (A)  Final    <10,000 COLONIES/mL INSIGNIFICANT GROWTH Performed at [facility], [address], [place], [place] [address]    Report Status 02/21/2024 FINAL  Final    Radiology Studies: No results found.  Scheduled Meds:  amLODipine   10 mg Oral Daily   fluticasone  furoate-vilanterol  1 puff Inhalation Daily   folic acid   1 mg Oral Daily   gabapentin   300 mg Oral Q8H   metoprolol  tartrate  50 mg Oral Q8H   mirtazapine   45 mg Oral QHS   pantoprazole   40 mg Oral Daily   QUEtiapine  Fumarate  150 mg Oral QHS   Continuous Infusions:     LOS: 4 days   35 minutes with more than 50% spent in reviewing records, counseling patient/family and coordinating care.  Reyes VEAR Gaw, MD [facility] [email or web address] 02/23/2024, 8:49 AM

## 2024-02-23 NOTE — Discharge Summary (Addendum)
 Physician Discharge Summary   Patient: Heidi Young MRN: [id number] DOB: 08-Apr-1946  Admit date:     02/18/2024  Discharge date: 02/23/24  Discharge Physician: Reyes VEAR Gaw   PCP: [name], MD   Recommendations at discharge:    Patient with longstanding constipation outpatient, takes lactulose  PRN.  Started on miralax , suppository, and Senna inpatient to help with bowel movements while admitted.   Recommend repeat BMET in next 5 days to ensure creatinine remains at her baseline.   BP has crept upwards while in house.  Restarted home BP meds.  Recommend BP check in next several days to ensure BP remains stable.    Discharge Diagnoses: Principal Problem:   AKI (acute kidney injury) (HCC) Active Problems:   Hyperlipidemia   Essential hypertension   Asthma   Bipolar 1 disorder (HCC)   Grade I diastolic dysfunction   Class 2 obesity   CKD stage 3b, GFR 30-44 ml/min (HCC)   Normocytic anemia   GERD (gastroesophageal reflux disease)   Major depression   Schizoaffective disorder (HCC)   Rhabdomyolysis  Resolved Problems:   * No resolved hospital problems. *  Hospital Course: 78 y.o. female with medical history significant of asthma, psychosis, schizophrenia, depression, bipolar 1 disorder, renal cell carcinoma, Takotsubo cardiomyopathy, left carotid bruit, chronic anemia, osteoarthritis, fibromyalgia, GERD, nephrolithiasis, hyperlipidemia, lumbar herniated disc, hypertension, heart murmur, chest pain please who presented to the emergency department with complaints of dyspnea, generalized weakness, arthralgias who presented to the emergency department who was admitted from 02/10/2024 until 02/13/2024 due to chest pain who presented to the emergency department via EMS after being IVC'ed by her son due to self harming behaviors and the delusion that she is having irritation over her skin from Voodoo.  Admitted to [facility] for same.  Treated with IVF and noted decreasing CK and SCr.   Turned off IVF 9/6 and she did well with oral hydration.  Medically ready for DC as of 02/23/2024.  DC and transfer to [facility].         Assessment & Plan: AKI (acute kidney injury) on CKD stage 3b, GFR 30-44 ml/min - 2/2 to rhabdo below. -Creatinine essentially stable past several days.   - The last labs we have on her prior to this admission were from 2015, which showed creatinine/GFR in stage III CKD class.  She is now back to that baseline. - Will continue with just oral rehydration.   - CK and SCr both stable on oral hydration, now medically ready for DC to inpt psych facility.     Rhabdomyolysis -CK continues to downtrend-->267 today.  No need to check any further. -LFTs have also downtrending, ALT is 45 and I presume this minimal elevation is secondary to fatty liver disease. - continue oral rehydration.   Metabolic acidosis: - now resolved.  -Most likely secondary to what appears to be longstanding stage III kidney disease. - Defer treatment to outpatient nephrology, recommend referral to nephrology outpatient on discharge.   Bipolar 1 disorder (HCC) Major depression Schizoaffective disorder (HCC) -Reason for initial presentation to the emergency department. -Psych following.   Continue mirtazapine  45 mg p.o. bedtime. Continue Seroquel  150 mg p.o. bedtime. Continue haloperidol  5 mg IVP every 6 hours as needed--> has been very stable and has not needed Plan will be for continuing home psych medications and recommend for inpatient psychiatric hospitalization after medically cleared.  Patient and son both agree with plan - Medically cleared for transfer when bed available as of today 9/7.  Constipation: - encouraged ambulation - adding BID miralax  up from daily PRN, senna, suppository as needed.    Hyperlipidemia Hold statin due to rhabdomyolysis. Can restart at discharge     Essential hypertension Continue amlodipine  10 mg p.o. daily. Continue metoprolol  tartrate  50 mg p.o. twice daily. BP is acceptable here overall, has had a few higher systolics over past couple of days Hesitant to add any renally acting medications like ACE/ARB or thiazide in light of her recent dehydration and AKI.  Hydralazine  added as needed and can follow-up outpatient for any further blood pressure adjustments.   Asthma Continue Dulera  1 Inh daily or formulary equivalent.   Grade I diastolic dysfunction Continue beta-blocker twice daily.   Class 2 obesity Current BMI 39.16 kg/m. Would benefit from lifestyle modifications. Follow-up closely with PCP.   Normocytic anemia Stable.  Monitor hematocrit and hemoglobin.  Presume chronic anemia secondary to longstanding renal disease.   GERD (gastroesophageal reflux disease) Continue pantoprazole  40 mg p.o. daily.   Consultants: Psych Procedures performed: none  Disposition: Inpt psych facility -- [facility] Diet recommendation:  Discharge Diet Orders (From admission, onward)     Start     Ordered   02/23/24 0000  Diet - low sodium heart healthy        02/23/24 1133           Regular diet DISCHARGE MEDICATION: Allergies as of 02/23/2024       Reactions   Bee Venom Anaphylaxis   Other Anaphylaxis   Red ant's, roaches    Avelox [moxifloxacin Hydrochloride] Rash, Other (See Comments)   Fever / Upset Stomach   Nsaids Other (See Comments)   Due to stomach ulcers   Ciprofloxacin Other (See Comments)   Upset stomach        Medication List     TAKE these medications    acetaminophen  325 MG tablet Commonly known as: TYLENOL  Take 2 tablets (650 mg total) by mouth every 6 (six) hours as needed for mild pain (pain score 1-3) or fever (or Fever >/= 101).   albuterol  108 (90 Base) MCG/ACT inhaler Commonly known as: VENTOLIN  HFA Inhale 2 puffs into the lungs 5 (five) times daily as needed for wheezing or shortness of breath.   albuterol  (2.5 MG/3ML) 0.083% nebulizer solution Commonly known as:  PROVENTIL  Take 2.5 mg by nebulization every 4 (four) hours as needed for wheezing or shortness of breath.   amLODipine  10 MG tablet Commonly known as: NORVASC  Take 10 mg by mouth daily.   atorvastatin  20 MG tablet Commonly known as: LIPITOR Take 20 mg by mouth at bedtime.   bisacodyl  10 MG suppository Commonly known as: DULCOLAX Place 1 suppository (10 mg total) rectally daily as needed for severe constipation (severe).   Constulose  10 GM/15ML solution Generic drug: lactulose  Take 20 g by mouth daily as needed for moderate constipation.   EpiPen 0.3 mg/0.3 mL Soaj injection Generic drug: EPINEPHrine Inject 0.3 mg into the muscle as needed (allergic reaction).   folic acid  1 MG tablet Commonly known as: FOLVITE  Take 1 mg by mouth daily.   furosemide  40 MG tablet Commonly known as: LASIX  Take 40 mg by mouth daily as needed (for fluid retention.).   gabapentin  300 MG capsule Commonly known as: NEURONTIN  Take 300 mg by mouth 3 (three) times daily.   levocetirizine 5 MG tablet Commonly known as: XYZAL Take 5 mg by mouth every evening.   meclizine  25 MG tablet Commonly known as: ANTIVERT  Take 1 tablet (  25 mg total) by mouth 3 (three) times daily as needed. What changed: reasons to take this   melatonin 3 MG Tabs tablet Take 3 mg by mouth at bedtime.   metoprolol  tartrate 50 MG tablet Commonly known as: LOPRESSOR  Take 50 mg by mouth 3 (three) times daily.   mirabegron  ER 25 MG Tb24 tablet Commonly known as: Myrbetriq  Take 1 tablet (25 mg total) by mouth daily as needed (for urinary frequency with lasix ).   mirtazapine  45 MG tablet Commonly known as: REMERON  Take 45 mg by mouth at bedtime.   mometasone -formoterol  100-5 MCG/ACT Aero Commonly known as: DULERA  Inhale 2 puffs into the lungs 2 (two) times daily as needed for wheezing or shortness of breath.   ondansetron  4 MG disintegrating tablet Commonly known as: ZOFRAN -ODT Take 4 mg by mouth every 6 (six) hours  as needed for nausea or vomiting.   pantoprazole  40 MG tablet Commonly known as: PROTONIX  Take 1 tablet (40 mg total) by mouth daily at 6 (six) AM.   polyethylene glycol 17 g packet Commonly known as: MIRALAX  / GLYCOLAX  Take 17 g by mouth 2 (two) times daily.   potassium chloride  SA 20 MEQ tablet Commonly known as: KLOR-CON  M Take 20 mEq by mouth daily as needed (takes with lasix  for fluid retention).   SEROquel  XR 150 MG 24 hr tablet Generic drug: QUEtiapine  Fumarate Take 150 mg by mouth at bedtime.   tiZANidine  4 MG tablet Commonly known as: ZANAFLEX  Take 4 mg by mouth every 8 (eight) hours as needed for muscle spasms.   traMADol  50 MG tablet Commonly known as: ULTRAM  Take 50 mg by mouth 3 (three) times daily as needed (pain).   VITAMIN D  (CHOLECALCIFEROL ) PO Take 1 tablet by mouth daily.        Discharge Exam: Filed Weights   02/18/24 2254  Weight: 113.4 kg   Gen: 78 y.o. female in no apparent distress.  Nontoxic Pulm: Non-labored breathing.  Clear to auscultation bilaterally.  CV: Regular rate and rhythm. Grade II/VI SEM noted.  GI: Abdomen soft, non-tender, non-distended, with normoactive bowel sounds. No organomegaly or masses felt. Ext: Warm, no deformities, obese legs but no pitting edema and not tightness/tenderness consistent with compartment syndrome.  Skin: No rashes, lesions no ulcers Neuro: Alert and oriented. No focal neurological deficits. Psych: Calm, mood and affect normal.   Condition at discharge: good  The results of significant diagnostics from this hospitalization (including imaging, microbiology, ancillary and laboratory) are listed below for reference.   Imaging Studies: DG Chest 2 View Result Date: 02/19/2024 CLINICAL DATA:  Tachycardia. EXAM: CHEST - 2 VIEW COMPARISON:  Earlier same day FINDINGS: Markedly low volume film with asymmetric elevation of the right hemidiaphragm as before. Patient is rotated to the right. Streaky density at  both lung bases is likely atelectatic. No edema or dense focal airspace consolidation. Soft tissue fullness in the high right paratracheal region is similar to 02/10/2024 and comparing back to a remote study from 03/23/2014 compatible with benign etiology. No acute bony abnormality. Telemetry leads overlie the chest. IMPRESSION: Markedly low volume film with bibasilar atelectasis. Electronically Signed   By: Camellia Candle M.D.   On: 02/19/2024 05:08   CT Head Wo Contrast Result Date: 02/19/2024 EXAM: CT HEAD WITHOUT CONTRAST 02/19/2024 04:08:14 AM TECHNIQUE: CT of the head was performed without the administration of intravenous contrast. Automated exposure control, iterative reconstruction, and/or weight based adjustment of the mA/kV was utilized to reduce the radiation dose to as low  as reasonably achievable. COMPARISON: CT of the head dated 02/11/2024. CLINICAL HISTORY: Mental status change, unknown cause; agitation, paranoia for 10 days. AMS, skin irritation from voodoo, hx of rt renal ca 1990- nephrectomy. FINDINGS: BRAIN AND VENTRICLES: No acute hemorrhage. No evidence of acute infarct. No hydrocephalus. No extra-axial collection. No mass effect or midline shift. Age-related atrophy. ORBITS: No acute abnormality. SINUSES: No acute abnormality. SOFT TISSUES AND SKULL: No acute soft tissue abnormality. No skull fracture. VASCULATURE: Moderate calcifications within the carotid siphons. IMPRESSION: 1. No acute intracranial abnormality. 2. Age-related atrophy. Electronically signed by: Evalene Coho MD 02/19/2024 04:17 AM EDT RP Workstation: [id number]   DG Chest Port 1 View Result Date: 02/19/2024 CLINICAL DATA:  Altered mental status, tachycardia EXAM: PORTABLE CHEST 1 VIEW COMPARISON:  02/10/2024 FINDINGS: Lung volumes are extremely small and pulmonary insufflation has decreased since prior examination. Stable elevation of the right hemidiaphragm. There is increasing opacity within right hilum which may  relate to bronchovascular crowding, but is nonspecific. Hilar adenopathy or perihilar pulmonary infiltrate could appear similarly. No pneumothorax or pleural effusion. Cardiac size within normal limits. Pulmonary vascularity is normal. No acute bone abnormality. IMPRESSION: 1. Extremely small lung volumes with bronchovascular crowding. 2. Increasing opacity within the right hilum which may relate to bronchovascular crowding, but is nonspecific. Hilar adenopathy or perihilar pulmonary infiltrate could appear similarly. This would be better assessed with a standard, inspiratory two view chest radiograph when the patient is able to do so. Electronically Signed   By: Dorethia Molt M.D.   On: 02/19/2024 01:14   ECHOCARDIOGRAM COMPLETE Result Date: 02/12/2024    ECHOCARDIOGRAM REPORT   Patient Name:   Heidi Young Date of Exam: 02/11/2024 Medical Rec #:  [id number]          Height:       67.0 in Accession #:    [id number]         Weight:       250.0 lb Date of Birth:  1945-08-15           BSA:          2.223 m Patient Age:    78 years           BP:           124/57 mmHg Patient Gender: F                  HR:           81 bpm. Exam Location:  Inpatient Procedure: 2D Echo (Both Spectral and Color Flow Doppler were utilized during            procedure). Indications:    Dyspnea R06.00  History:        Patient has no prior history of Echocardiogram examinations.                 Risk Factors:Hypertension.  Sonographer:    Jayson Gaskins Referring Phys: [id number] DAVID MANUEL ORTIZ IMPRESSIONS  1. Left ventricular ejection fraction, by estimation, is 55 to 60%. The left ventricle has normal function. The left ventricle has no regional wall motion abnormalities. Left ventricular diastolic parameters are consistent with Grade I diastolic dysfunction (impaired relaxation).  2. Right ventricular systolic function is normal. The right ventricular size is normal. IVC is not visualized. RVSP is atleast .  3. The mitral valve  is normal in structure. Trivial mitral valve regurgitation.  4. The aortic valve is normal in structure. Aortic valve regurgitation is not visualized.  FINDINGS  Left Ventricle: Left ventricular ejection fraction, by estimation, is 55 to 60%. The left ventricle has normal function. The left ventricle has no regional wall motion abnormalities. The left ventricular internal cavity size was normal in size. There is  no left ventricular hypertrophy. Left ventricular diastolic parameters are consistent with Grade I diastolic dysfunction (impaired relaxation). Right Ventricle: The right ventricular size is normal. No increase in right ventricular wall thickness. Right ventricular systolic function is normal. Left Atrium: Left atrial size was normal in size. Right Atrium: Right atrial size was normal in size. Pericardium: There is no evidence of pericardial effusion. Presence of epicardial fat layer. Mitral Valve: The mitral valve is normal in structure. Trivial mitral valve regurgitation. Tricuspid Valve: The tricuspid valve is grossly normal. Tricuspid valve regurgitation is mild. Aortic Valve: The aortic valve is normal in structure. Aortic valve regurgitation is not visualized. Aortic valve mean gradient measures 5.0 mmHg. Aortic valve peak gradient measures 8.9 mmHg. Aortic valve area, by VTI measures 2.54 cm. Pulmonic Valve: The pulmonic valve was grossly normal. Pulmonic valve regurgitation is trivial. Aorta: The aortic root and ascending aorta are structurally normal, with no evidence of dilitation. IAS/Shunts: No atrial level shunt detected by color flow Doppler.  LEFT VENTRICLE PLAX 2D LVIDd:         4.80 cm   Diastology LVIDs:         2.90 cm   LV e' medial:    6.85 cm/s LV PW:         0.80 cm   LV E/e' medial:  17.8 LV IVS:        0.90 cm   LV e' lateral:   6.53 cm/s LVOT diam:     1.80 cm   LV E/e' lateral: 18.7 LV SV:         72 LV SV Index:   32 LVOT Area:     2.54 cm  RIGHT VENTRICLE RV S prime:     18.50  cm/s TAPSE (M-mode): 3.1 cm LEFT ATRIUM             Index        RIGHT ATRIUM           Index LA Vol (A2C):   44.1 ml 19.83 ml/m  RA Area:     14.80 cm LA Vol (A4C):   66.2 ml 29.77 ml/m  RA Volume:   33.80 ml  15.20 ml/m LA Biplane Vol: 55.3 ml 24.87 ml/m  AORTIC VALVE AV Area (Vmax):    2.07 cm AV Area (Vmean):   2.33 cm AV Area (VTI):     2.54 cm AV Vmax:           149.00 cm/s AV Vmean:          99.800 cm/s AV VTI:            0.283 m AV Peak Grad:      8.9 mmHg AV Mean Grad:      5.0 mmHg LVOT Vmax:         121.00 cm/s LVOT Vmean:        91.400 cm/s LVOT VTI:          0.282 m LVOT/AV VTI ratio: 1.00  AORTA Ao Root diam: 2.50 cm Ao Asc diam:  2.80 cm MITRAL VALVE                TRICUSPID VALVE MV Area (PHT): 4.39 cm     TR Peak grad:  39.7 mmHg MV Decel Time: 173 msec     TR Vmax:        315.00 cm/s MV E velocity: 122.00 cm/s MV A velocity: 115.00 cm/s  SHUNTS MV E/A ratio:  1.06         Systemic VTI:  0.28 m                             Systemic Diam: 1.80 cm Aditya Sabharwal Electronically signed by Ria Commander Signature Date/Time: 02/12/2024/7:33:25 PM    Final    VAS US  LOWER EXTREMITY VENOUS (DVT) Result Date: 02/12/2024  Lower Venous DVT Study Patient Name:  Heidi Young  Date of Exam:   02/12/2024 Medical Rec #: [id number]           Accession #:    [id number] Date of Birth: 1945-11-15            Patient Gender: F Patient Age:   73 years Exam Location:  [facility] Procedure:      VAS US  LOWER EXTREMITY VENOUS (DVT) Referring Phys: DAVID ORTIZ --------------------------------------------------------------------------------  Indications: Edema.  Risk Factors: None identified. Limitations: Body habitus and poor ultrasound/tissue interface. Comparison Study: No prior studies. Performing Technologist: Cordella Collet RVT  Examination Guidelines: A complete evaluation includes B-mode imaging, spectral Doppler, color Doppler, and power Doppler as needed of all accessible portions of each  vessel. Bilateral testing is considered an integral part of a complete examination. Limited examinations for reoccurring indications may be performed as noted. The reflux portion of the exam is performed with the patient in reverse Trendelenburg.  +---------+---------------+---------+-----------+----------+-------------------+ RIGHT    CompressibilityPhasicitySpontaneityPropertiesThrombus Aging      +---------+---------------+---------+-----------+----------+-------------------+ CFV      Full           Yes      Yes                                      +---------+---------------+---------+-----------+----------+-------------------+ SFJ      Full                                                             +---------+---------------+---------+-----------+----------+-------------------+ FV Prox  Full                                                             +---------+---------------+---------+-----------+----------+-------------------+ FV Mid   Full                                                             +---------+---------------+---------+-----------+----------+-------------------+ FV DistalFull                                                             +---------+---------------+---------+-----------+----------+-------------------+  PFV      Full                                                             +---------+---------------+---------+-----------+----------+-------------------+ POP      Full           Yes      Yes                                      +---------+---------------+---------+-----------+----------+-------------------+ PTV      Full                                                             +---------+---------------+---------+-----------+----------+-------------------+ PERO                                                  Not well visualized +---------+---------------+---------+-----------+----------+-------------------+    +---------+---------------+---------+-----------+----------+-------------------+ LEFT     CompressibilityPhasicitySpontaneityPropertiesThrombus Aging      +---------+---------------+---------+-----------+----------+-------------------+ CFV      Full           Yes      Yes                                      +---------+---------------+---------+-----------+----------+-------------------+ SFJ      Full                                                             +---------+---------------+---------+-----------+----------+-------------------+ FV Prox  Full                                                             +---------+---------------+---------+-----------+----------+-------------------+ FV Mid   Full                                                             +---------+---------------+---------+-----------+----------+-------------------+ FV DistalFull                                                             +---------+---------------+---------+-----------+----------+-------------------+ PFV      Full                                                             +---------+---------------+---------+-----------+----------+-------------------+  POP      Full           Yes      Yes                                      +---------+---------------+---------+-----------+----------+-------------------+ PTV      Full                                                             +---------+---------------+---------+-----------+----------+-------------------+ PERO                                                  Not well visualized +---------+---------------+---------+-----------+----------+-------------------+     Summary: RIGHT: - There is no evidence of deep vein thrombosis in the lower extremity. However, portions of this examination were limited- see technologist comments above.  - No cystic structure found in the popliteal fossa.  LEFT: - There is  no evidence of deep vein thrombosis in the lower extremity. However, portions of this examination were limited- see technologist comments above.  - No cystic structure found in the popliteal fossa.  *See table(s) above for measurements and observations. Electronically signed by Lonni Gaskins MD on 02/12/2024 at 6:05:29 PM.    Final    NM Pulmonary Perfusion Result Date: 02/11/2024 CLINICAL DATA:  Pulmonary embolus suspected with low to intermediate probability. Positive D-dimer. No previous history of pulmonary embolus. Some leg swelling. History of COPD and asthma. Shortness of breath. EXAM: NUCLEAR MEDICINE PERFUSION LUNG SCAN TECHNIQUE: Perfusion images were obtained in multiple projections after intravenous injection of radiopharmaceutical. Ventilation scans intentionally deferred if perfusion scan and chest x-ray adequate for interpretation during COVID 19 epidemic. RADIOPHARMACEUTICALS:  4.4 mCi Tc-32m MAA IV COMPARISON:  Chest radiograph 02/10/2024 FINDINGS: Normal homogeneous distribution of tracer activity throughout the lungs on perfusion imaging. No photopenic defects are demonstrated. IMPRESSION: Very low probability of pulmonary embolus. Electronically Signed   By: Elsie Gravely M.D.   On: 02/11/2024 15:37   CT Head Wo Contrast Result Date: 02/11/2024 CLINICAL DATA:  Altered mental status EXAM: CT HEAD WITHOUT CONTRAST TECHNIQUE: Contiguous axial images were obtained from the base of the skull through the vertex without intravenous contrast. RADIATION DOSE REDUCTION: This exam was performed according to the departmental dose-optimization program which includes automated exposure control, adjustment of the mA and/or kV according to patient size and/or use of iterative reconstruction technique. COMPARISON:  06/12/2014 FINDINGS: Brain: No evidence of acute infarction, hemorrhage, hydrocephalus, extra-axial collection or mass lesion/mass effect. Vascular: No hyperdense vessel or unexpected  calcification. Skull: Normal. Negative for fracture or focal lesion. Sinuses/Orbits: No acute finding. Other: None. IMPRESSION: No acute intracranial abnormality noted. Electronically Signed   By: Oneil Devonshire M.D.   On: 02/11/2024 02:18   DG Chest 2 View Result Date: 02/10/2024 CLINICAL DATA:  Shortness of breath EXAM: CHEST - 2 VIEW COMPARISON:  Chest radiograph June 12, 2014 FINDINGS: Elevation of right hemidiaphragm, slightly increased to prior. The heart size and mediastinal contours are within normal limits. Aortic knob is calcified. Both lungs are clear. No pleural effusion. Prominent  right paratracheal soft tissue possibly due to vascular structures. Increased dorsal kyphosis. IMPRESSION: Elevation of right hemidiaphragm, similar to slightly increased to prior exam which may be due to eventrated hemidiaphragm or segmental lung atelectasis. Recommend chest CT for further assessment if clinically warranted. Electronically Signed   By: Megan  Zare M.D.   On: 02/10/2024 20:31    Microbiology: Results for orders placed or performed during the hospital encounter of 02/18/24  Urine Culture     Status: Abnormal   Collection Time: 02/20/24  1:57 PM   Specimen: Urine, Random  Result Value Ref Range Status   Specimen Description   Final    URINE, RANDOM Performed at [facility], [address], [place], [place] [address]    Special Requests   Final    NONE Reflexed from [id number] Performed at [facility], [address], [place], [place] [address]    Culture (A)  Final    <10,000 COLONIES/mL INSIGNIFICANT GROWTH Performed at [facility], [address] [place], [place] [address]    Report Status 02/21/2024 FINAL  Final    Labs: CBC: Recent Labs  Lab 02/19/24 0323 02/20/24 0601 02/21/24 0358 02/22/24 0500  WBC 8.4 6.4 6.5 6.4  NEUTROABS 5.2  --   --   --   HGB 9.2* 8.8* 8.1* 8.8*  HCT 32.0* 30.8* 28.4* 31.3*  MCV 90.1 91.1 91.9 91.5   PLT 286 311 299 342   Basic Metabolic Panel: Recent Labs  Lab 02/19/24 0323 02/20/24 0601 02/21/24 0358 02/22/24 0500 02/23/24 0640  NA 145 141 139 138 139  K 4.1 3.9 4.1 4.1 4.2  CL 107 108 107 105 103  CO2 21* 21* 21* 19* 23  GLUCOSE 93 98 89 91 94  BUN 38* 27* 21 20 19   CREATININE 2.10* 1.47* 1.30* 1.35* 1.37*  CALCIUM  9.7 9.2 9.4 10.0 10.5*   Liver Function Tests: Recent Labs  Lab 02/19/24 0323 02/20/24 0601 02/21/24 0358 02/22/24 0500  AST 48* 55* 35 32  ALT 40 56* 46* 45*  ALKPHOS 82 81 72 81  BILITOT 0.8 0.6 0.5 0.6  PROT 6.8 6.4* 5.9* 6.4*  ALBUMIN  3.9 3.7 3.5 3.7   CBG: No results for input(s): GLUCAP in the last 168 hours.  Discharge time spent: less than 30 minutes.  Signed: Reyes VEAR Gaw, MD [facility] 02/23/2024

## 2024-02-23 NOTE — Progress Notes (Signed)
 Transfer report called to [facility] [address].

## 2024-02-23 NOTE — TOC Transition Note (Addendum)
 Transition of Care [facility]) - Discharge Note   Patient Details  Name: Heidi Young MRN: [id number] Date of Birth: 11-20-1945  Transition of Care [facility]) CM/SW Contact:  Sonda Manuella Quill, RN Phone Number: 02/23/2024, 12:39 PM   Clinical Narrative:    D/C orders received; pt accept at [facility] RM # 31, call report # [phone]; transport by [facility]; LVM for Carin Righter Clipstein [phone]) at 1227; awaiting return call; also attempted to notify pt's son; original documents (Findings & Custody, Return of Service, Affidavit & Petition for [employer], and First Exam for IVC) and 3 copies placed in d/c packet; LVM for her son Heidi Young [phone]); awaiting return call.  -1257- pt's son Heidi Young notified and agreed to d/c plan.  -1518- notified room changed to # 26  -1659- notified pt moving to [facility] RM # [id number]; LVM for [facility]ip[facility], for notification of room change; also left contact numbers to [facility], and IP TOC.  Final next level of care: Psychiatric Hospital Barriers to Discharge: No Barriers Identified   Patient Goals and CMS Choice   CMS Medicare.gov Compare Post Acute Care list provided to::  (NA) Choice offered to / list presented to : NA [place] ownership interest in [facility].provided to:: Parent NA    Discharge Placement                  Name of family member notified: LVM for Heidi Young (son) [phone] Patient and family notified of of transfer: 02/23/24  Discharge Plan and Services Additional resources added to the After Visit Summary for   In-house Referral: NA Discharge Planning Services: CM Consult Post Acute Care Choice: NA          DME Arranged: N/A DME Agency: NA       HH Arranged: NA HH Agency: NA        Social Drivers of Health (SDOH) Interventions SDOH Screenings   Food Insecurity: No Food Insecurity (02/19/2024)  Recent Concern: Food Insecurity - Food Insecurity Present (02/11/2024)  Housing:  Low Risk  (02/19/2024)  Transportation Needs: No Transportation Needs (02/19/2024)  Utilities: Not At Risk (02/19/2024)  Social Connections: Moderately Isolated (02/19/2024)  Tobacco Use: Unknown (02/18/2024)     Readmission Risk Interventions    02/20/2024    4:47 PM  Readmission Risk Prevention Plan  Transportation Screening Complete  PCP or Specialist Appt within 3-5 Days Complete  HRI or Home Care Consult Complete  Social Work Consult for Recovery Care Planning/Counseling Complete  Palliative Care Screening Not Applicable  Medication Review Oceanographer) Complete

## 2024-02-24 ENCOUNTER — Inpatient Hospital Stay
Admission: AD | Admit: 2024-02-24 | Discharge: 2024-03-05 | DRG: 885 | Disposition: A | Discharge status: Home / Self Care | Source: Intra-hospital | Attending: Psychiatry | Admitting: Psychiatry

## 2024-02-24 ENCOUNTER — Encounter: Payer: Self-pay | Admitting: Psychiatry

## 2024-02-24 ENCOUNTER — Other Ambulatory Visit: Payer: Self-pay

## 2024-02-24 DIAGNOSIS — Z5948 Other specified lack of adequate food: Secondary | ICD-10-CM

## 2024-02-24 DIAGNOSIS — F419 Anxiety disorder, unspecified: Secondary | ICD-10-CM | POA: Diagnosis present

## 2024-02-24 DIAGNOSIS — Z91410 Personal history of adult physical and sexual abuse: Secondary | ICD-10-CM | POA: Diagnosis not present

## 2024-02-24 DIAGNOSIS — F250 Schizoaffective disorder, bipolar type: Principal | ICD-10-CM | POA: Diagnosis present

## 2024-02-24 DIAGNOSIS — Z7951 Long term (current) use of inhaled steroids: Secondary | ICD-10-CM | POA: Diagnosis not present

## 2024-02-24 DIAGNOSIS — Z888 Allergy status to other drugs, medicaments and biological substances status: Secondary | ICD-10-CM

## 2024-02-24 DIAGNOSIS — M797 Fibromyalgia: Secondary | ICD-10-CM | POA: Diagnosis present

## 2024-02-24 DIAGNOSIS — Z8249 Family history of ischemic heart disease and other diseases of the circulatory system: Secondary | ICD-10-CM

## 2024-02-24 DIAGNOSIS — Z881 Allergy status to other antibiotic agents status: Secondary | ICD-10-CM

## 2024-02-24 DIAGNOSIS — Z79899 Other long term (current) drug therapy: Secondary | ICD-10-CM

## 2024-02-24 DIAGNOSIS — I10 Essential (primary) hypertension: Secondary | ICD-10-CM | POA: Diagnosis present

## 2024-02-24 DIAGNOSIS — Z87442 Personal history of urinary calculi: Secondary | ICD-10-CM | POA: Diagnosis not present

## 2024-02-24 DIAGNOSIS — J45909 Unspecified asthma, uncomplicated: Secondary | ICD-10-CM | POA: Diagnosis present

## 2024-02-24 DIAGNOSIS — E669 Obesity, unspecified: Secondary | ICD-10-CM | POA: Diagnosis present

## 2024-02-24 DIAGNOSIS — Z823 Family history of stroke: Secondary | ICD-10-CM | POA: Diagnosis not present

## 2024-02-24 DIAGNOSIS — G8929 Other chronic pain: Secondary | ICD-10-CM | POA: Diagnosis present

## 2024-02-24 DIAGNOSIS — Z5941 Food insecurity: Secondary | ICD-10-CM

## 2024-02-24 DIAGNOSIS — Z96653 Presence of artificial knee joint, bilateral: Secondary | ICD-10-CM | POA: Diagnosis present

## 2024-02-24 DIAGNOSIS — Z85528 Personal history of other malignant neoplasm of kidney: Secondary | ICD-10-CM

## 2024-02-24 DIAGNOSIS — Z79890 Hormone replacement therapy (postmenopausal): Secondary | ICD-10-CM | POA: Diagnosis not present

## 2024-02-24 DIAGNOSIS — Z6841 Body Mass Index (BMI) 40.0 and over, adult: Secondary | ICD-10-CM

## 2024-02-24 DIAGNOSIS — E785 Hyperlipidemia, unspecified: Secondary | ICD-10-CM | POA: Diagnosis present

## 2024-02-24 DIAGNOSIS — Z808 Family history of malignant neoplasm of other organs or systems: Secondary | ICD-10-CM | POA: Diagnosis not present

## 2024-02-24 DIAGNOSIS — Z905 Acquired absence of kidney: Secondary | ICD-10-CM | POA: Diagnosis not present

## 2024-02-24 DIAGNOSIS — R5383 Other fatigue: Secondary | ICD-10-CM | POA: Diagnosis present

## 2024-02-24 MED ORDER — QUETIAPINE FUMARATE ER 50 MG PO TB24
150.0000 mg | ORAL_TABLET | Freq: Every day | ORAL | Status: DC
  Administered 2024-02-24 – 2024-02-27 (×4): 150 mg via ORAL
  Filled 2024-02-24 (×4): qty 3

## 2024-02-24 MED ORDER — AMLODIPINE BESYLATE 5 MG PO TABS
10.0000 mg | ORAL_TABLET | Freq: Every day | ORAL | Status: DC
  Administered 2024-02-25 – 2024-03-05 (×10): 10 mg via ORAL
  Filled 2024-02-24 (×10): qty 2

## 2024-02-24 MED ORDER — FLUTICASONE FUROATE-VILANTEROL 100-25 MCG/ACT IN AEPB
1.0000 | INHALATION_SPRAY | Freq: Every day | RESPIRATORY_TRACT | Status: DC
  Administered 2024-02-25 – 2024-03-05 (×10): 1 via RESPIRATORY_TRACT
  Filled 2024-02-24: qty 28

## 2024-02-24 MED ORDER — MIRABEGRON ER 25 MG PO TB24: 25.0000 mg | ORAL_TABLET | Freq: Every day | ORAL | Status: DC | PRN

## 2024-02-24 MED ORDER — OLANZAPINE 5 MG PO TABS
5.0000 mg | ORAL_TABLET | Freq: Two times a day (BID) | ORAL | Status: DC | PRN
  Administered 2024-03-02: 5 mg via ORAL
  Filled 2024-02-24: qty 1

## 2024-02-24 MED ORDER — ACETAMINOPHEN 325 MG PO TABS
650.0000 mg | ORAL_TABLET | Freq: Four times a day (QID) | ORAL | Status: DC | PRN
  Administered 2024-02-27 – 2024-03-02 (×3): 650 mg via ORAL
  Filled 2024-02-24 (×3): qty 2

## 2024-02-24 MED ORDER — MIRTAZAPINE 15 MG PO TABS
45.0000 mg | ORAL_TABLET | Freq: Every day | ORAL | Status: DC
  Administered 2024-02-24 – 2024-03-04 (×9): 45 mg via ORAL
  Filled 2024-02-24 (×10): qty 3

## 2024-02-24 MED ORDER — TRAMADOL HCL 50 MG PO TABS
50.0000 mg | ORAL_TABLET | Freq: Three times a day (TID) | ORAL | Status: DC | PRN
  Administered 2024-02-24 – 2024-03-05 (×10): 50 mg via ORAL
  Filled 2024-02-24 (×11): qty 1

## 2024-02-24 MED ORDER — OLANZAPINE 10 MG IM SOLR: 5.0000 mg | Freq: Two times a day (BID) | INTRAMUSCULAR | Status: DC | PRN

## 2024-02-24 MED ORDER — ALBUTEROL SULFATE (2.5 MG/3ML) 0.083% IN NEBU
3.0000 mL | INHALATION_SOLUTION | Freq: Every day | RESPIRATORY_TRACT | Status: DC | PRN
  Administered 2024-03-04: 3 mL via RESPIRATORY_TRACT
  Filled 2024-02-24 (×2): qty 3

## 2024-02-24 MED ORDER — POLYETHYLENE GLYCOL 3350 17 G PO PACK
17.0000 g | PACK | Freq: Every day | ORAL | Status: DC | PRN
  Administered 2024-02-28 – 2024-03-04 (×4): 17 g via ORAL
  Filled 2024-02-24 (×4): qty 1

## 2024-02-24 MED ORDER — FOLIC ACID 1 MG PO TABS
1.0000 mg | ORAL_TABLET | Freq: Every day | ORAL | Status: DC
  Administered 2024-02-25 – 2024-03-05 (×10): 1 mg via ORAL
  Filled 2024-02-24 (×10): qty 1

## 2024-02-24 MED ORDER — ALUM & MAG HYDROXIDE-SIMETH 200-200-20 MG/5ML PO SUSP: 30.0000 mL | ORAL | Status: DC | PRN

## 2024-02-24 MED ORDER — METOPROLOL TARTRATE 25 MG PO TABS
50.0000 mg | ORAL_TABLET | Freq: Three times a day (TID) | ORAL | Status: DC
  Administered 2024-02-24 – 2024-03-05 (×25): 50 mg via ORAL
  Filled 2024-02-24 (×29): qty 2

## 2024-02-24 MED ORDER — PANTOPRAZOLE SODIUM 40 MG PO TBEC
40.0000 mg | DELAYED_RELEASE_TABLET | Freq: Every day | ORAL | Status: DC
  Administered 2024-02-25 – 2024-03-05 (×10): 40 mg via ORAL
  Filled 2024-02-24 (×10): qty 1

## 2024-02-24 MED ORDER — SENNOSIDES-DOCUSATE SODIUM 8.6-50 MG PO TABS
2.0000 | ORAL_TABLET | Freq: Every day | ORAL | Status: DC | PRN
  Administered 2024-02-25: 2 via ORAL
  Filled 2024-02-24 (×2): qty 2

## 2024-02-24 MED ORDER — GABAPENTIN 300 MG PO CAPS
300.0000 mg | ORAL_CAPSULE | Freq: Three times a day (TID) | ORAL | Status: DC
  Administered 2024-02-24 – 2024-03-05 (×28): 300 mg via ORAL
  Filled 2024-02-24 (×29): qty 1

## 2024-02-24 NOTE — Group Note (Signed)
 Recreation Therapy Group Note   Group Topic:Leisure Education  Group Date: 02/24/2024 Start Time: 1400 End Time: 1500 Facilitators: Celestia Jeoffrey BRAVO, LRT, CTRS Location: Courtyard  Group Description: Music. Patients encouraged to name their favorite song(s) for LRT to play song through speaker for group to hear, while in the courtyard getting fresh air and sunlight. Patients educated on the definition of leisure and the importance of having different leisure interests outside of the hospital. Group discussed how leisure activities can often be used as Pharmacologist and that listening to music and being outside are examples.    Goal Area(s) Addressed:  Patient will identify a current leisure interest.  Patient will practice making a positive decision. Patient will have the opportunity to try a new leisure activity.   Affect/Mood: Elevated   Participation Level: Hyperverbal    Clinical Observations/Individualized Feedback: Lala was present for the first half of group. Pt shared that she likes jazz music. Pt is very talkative and disorganized.   Plan: Continue to engage patient in RT group sessions 2-3x/week.   Jeoffrey BRAVO Celestia, LRT, CTRS 02/24/2024 5:03 PM

## 2024-02-24 NOTE — Group Note (Signed)
 Physical/Occupational Therapy Group Note  Group Topic: Functional, Dynamic Balance   Group Date: 02/24/2024 Start Time: 1302 End Time: 1332 Facilitators: Faheem Ziemann, Alm Hamilton, PT   Group Description: Group discussed impact of balance on safety and independence with functional tasks.  Identified and discussed any self-perceived balance deficits to personalize information.  Discussed and reviewed strategies to address/improve balance deficits: use of assist devices, activity pacing/energy conservation, environment/home safety modifications, focusing attention/minimizing distraction.  Reviewed and participated with standing LE therex designed to target dynamic balance reactions and LE strength/stability; provided handouts with HEP to be utilized outside of group time as appropriate.  Allowed time for questions and further discussion on any balance or mobility concerns/needs.  Therapeutic Goal(s):  Identify and discuss any individual balance deficits and functional implications. Identify and discuss any environmental/home safety modifications that can optimize balance and safety for mobility within the home. Demonstrate understanding and performance of standing therex designed to target dynamic balance deficits.  Individual Participation:  Pt actively participated with the discussion and physical activity components of the session.  Pt hyperverbal at times with conversation occasionally drifting away from relevancy but pt was pleasant throughout and mostly on topic.  Pt was generally steady with standing therex/balance activities requiring only SBA for general safety.  Participation Level: Active, Engaged, and Hyperverbal   Participation Quality: Minimal Cues   Behavior: Alert and Appropriate   Speech/Thought Process: Coherent with occasional loose association    Affect/Mood: Appropriate   Insight: Moderate   Judgement: Moderate   Modes of Intervention: Activity, Discussion, and Education   Patient Response to Interventions:  Attentive, Engaged, and Interested    Plan: Continue to engage patient in PT/OT groups 1 - 2x/week.  CHARM Hamilton Bertin PT, DPT 02/24/24, 2:15 PM

## 2024-02-24 NOTE — Care Management (Signed)
 Patient seen and examined. Moving around in the room. Patient tells me she feels well and is very much likes the plan to go to [place].  EMTALA signed .  Medically stabilized. Further Psychiatric medication management as per Geropsychiatry team at [facility].    No charge visit.

## 2024-02-24 NOTE — TOC Transition Note (Signed)
 Transition of Care [facility]) - Discharge Note   Patient Details  Name: Heidi Young MRN: [id number] Date of Birth: 04/22/1946  Transition of Care [facility]) CM/SW Contact:  Alfonse JONELLE Rex, RN Phone Number: 02/24/2024, 10:52 AM   Clinical Narrative:  DC to [facility] Geropsych by [facility].  No further TOC needs identified at this time.           Patient Goals and CMS Choice            Discharge Placement                       Discharge Plan and Services Additional resources added to the After Visit Summary for                                       Social Drivers of Health (SDOH) Interventions SDOH Screenings   Food Insecurity: No Food Insecurity (02/19/2024)  Recent Concern: Food Insecurity - Food Insecurity Present (02/11/2024)  Housing: Low Risk  (02/19/2024)  Transportation Needs: No Transportation Needs (02/19/2024)  Utilities: Not At Risk (02/19/2024)  Social Connections: Moderately Isolated (02/19/2024)  Tobacco Use: Unknown (02/18/2024)     Readmission Risk Interventions    02/20/2024    4:47 PM  Readmission Risk Prevention Plan  Transportation Screening Complete  PCP or Specialist Appt within 3-5 Days Complete  HRI or Home Care Consult Complete  Social Work Consult for Recovery Care Planning/Counseling Complete  Palliative Care Screening Not Applicable  Medication Review Oceanographer) Complete

## 2024-02-24 NOTE — BH Assessment (Signed)
 Recreation Therapy Notes  INPATIENT RECREATION THERAPY ASSESSMENT  Patient Details Name: Heidi Young MRN: [id number] DOB: [birth year] Today's Date: 02/24/2024  Able to Participate in Assessment/Interview: No  Heidi Young 02/24/2024, 5:53 PM

## 2024-02-24 NOTE — Progress Notes (Signed)
   02/24/24 2000  Psych Admission Type (Psych Patients Only)  Admission Status Involuntary  Psychosocial Assessment  Patient Complaints Anxiety  Eye Contact Fair  Facial Expression Animated  Affect Preoccupied  Speech Logical/coherent  Interaction Assertive  Motor Activity Tremors  Appearance/Hygiene Unremarkable  Behavior Characteristics Cooperative  Mood Pleasant  Aggressive Behavior  Effect No apparent injury  Thought Process  Coherency WDL  Content WDL  Delusions None reported or observed  Perception WDL  Hallucination None reported or observed  Judgment WDL  Confusion WDL  Danger to Self  Current suicidal ideation? Denies  Agreement Not to Harm Self Yes  Description of Agreement Verbal  Danger to Others  Danger to Others None reported or observed

## 2024-02-24 NOTE — Progress Notes (Signed)
   02/24/24 1134  Psych Admission Type (Psych Patients Only)  Admission Status Involuntary  Psychosocial Assessment  Patient Complaints Anxiety (I had was just so anxious and concerned about everything, and I would forget to get up slowly because I have vertigo you know, but I feel better since I've gotten here. I needed to be here. )  Eye Contact Fair  Facial Expression Animated  Affect Preoccupied  Speech Logical/coherent  Interaction Assertive  Motor Activity Tremors  Appearance/Hygiene Unremarkable  Behavior Characteristics Cooperative  Mood Pleasant  Aggressive Behavior  Effect No apparent injury  Thought Process  Coherency WDL  Content WDL  Delusions None reported or observed  Perception WDL  Hallucination None reported or observed  Judgment WDL  Confusion WDL  Danger to Self  Current suicidal ideation? Denies  Agreement Not to Harm Self Yes  Description of Agreement Verbalized  Danger to Others  Danger to Others None reported or observed

## 2024-02-24 NOTE — Group Note (Signed)
 Date:  02/24/2024 Time:  11:52 PM  Group Topic/Focus:  Wrap-Up Group:   The focus of this group is to help patients review their daily goal of treatment and discuss progress on daily workbooks.    Participation Level:  Active  Participation Quality:  Appropriate  Affect:  Appropriate  Cognitive:  Alert  Insight: Appropriate  Engagement in Group:  Engaged  Modes of Intervention:  Discussion  Additional Comments:    Tommas CHRISTELLA Bunker 02/24/2024, 11:52 PM

## 2024-02-24 NOTE — Plan of Care (Signed)
 ?  Problem: Education: ?Goal: Mental status will improve ?Outcome: Progressing ?Goal: Verbalization of understanding the information provided will improve ?Outcome: Progressing ?  ?

## 2024-02-24 NOTE — Plan of Care (Signed)
 New admission from [facility] transported by sheriff d/t IVC. Patient alert and oriented x4. Denies SI, HI, AVH and pain at this time. Scheduled medications were administered on previous unit prior to arrival. Pt transported on to unit via wheelchair. Support and encouragement provided.  Routine safety checks conducted every 15 minutes.  Skin assessment completed w/2nd nurse AB.   Patient informed to notify staff with problems or concerns. No adverse drug reactions noted. Patient verbally contracts for safety at this time. Patient interacts well with others on the unit.  Patient remains safe at this time. Care, comfort, and safety maintained/ongoing.   Problem: Education: Goal: Knowledge of [place] [facility] Education information/materials will improve Outcome: Not Progressing Goal: Emotional status will improve Outcome: Not Progressing Goal: Mental status will improve Outcome: Not Progressing Goal: Verbalization of understanding the information provided will improve Outcome: Not Progressing   Problem: Activity: Goal: Interest or engagement in activities will improve Outcome: Not Progressing Goal: Sleeping patterns will improve Outcome: Not Progressing   Problem: Health Behavior/Discharge Planning: Goal: Identification of resources available to assist in meeting health care needs will improve Outcome: Not Progressing Goal: Compliance with treatment plan for underlying cause of condition will improve Outcome: Not Progressing   Problem: Physical Regulation: Goal: Ability to maintain clinical measurements within normal limits will improve Outcome: Not Progressing   Problem: Safety: Goal: Periods of time without injury will increase Outcome: Not Progressing

## 2024-02-24 NOTE — Plan of Care (Signed)
 ?  Problem: Clinical Measurements: ?Goal: Will remain free from infection ?Outcome: Progressing ?  ?

## 2024-02-24 NOTE — Progress Notes (Signed)
 Report was given to [facility] [email or web address] 

## 2024-02-25 NOTE — Plan of Care (Signed)

## 2024-02-25 NOTE — Group Note (Signed)
 LCSW Group Therapy Note  Group Date: 02/25/2024 Start Time: 1300 End Time: 1330   Type of Therapy and Topic:  Group Therapy: Positive Affirmations  Participation Level:  Minimal   Description of Group:   This group addressed positive affirmation towards self and others.  Patients went around the room and identified two positive things about themselves and two positive things about a peer in the room.  Patients reflected on how it felt to share something positive with others, to identify positive things about themselves, and to hear positive things from others/ Patients were encouraged to have a daily reflection of positive characteristics or circumstances.   Therapeutic Goals: Patients will verbalize two of their positive qualities Patients will demonstrate empathy for others by stating two positive qualities about a peer in the group Patients will verbalize their feelings when voicing positive self affirmations and when voicing positive affirmations of others Patients will discuss the potential positive impact on their wellness/recovery of focusing on positive traits of self and others.  Summary of Patient Progress:  Heidi Young actively engaged in the discussion and . She was able to identify positive affirmations about herself as well as other group members. Patient demonstrated fair insight into the subject matter, was respectful of peers, participated throughout the entire session.  Therapeutic Modalities:   Cognitive Behavioral Therapy Motivational Interviewing    [name], [place] 02/25/2024  1:45 PM
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
## 2024-02-25 NOTE — [facility] Suicide Risk Assessment (Signed)
 [facility] Admission Suicide Risk Assessment   Nursing information obtained from:  Patient Demographic factors:  Age 78 or older, Divorced or widowed, Living alone Current Mental Status:  NA Loss Factors:  Decline in physical health Historical Factors:  Family history of mental illness or substance abuse Risk Reduction Factors:  Sense of responsibility to family, Positive social support  Total Time spent with patient: 30 minutes Principal Problem: Schizoaffective disorder, bipolar type (HCC) Diagnosis:  Principal Problem:   Schizoaffective disorder, bipolar type (HCC)  Subjective Data: This is a 78 year old female with a history of schizoaffective disorder, bipolar type. Presenting issues included depressed mood, anxiety, paranoid delusions, and visual hallucinations. She denies SI/HI/plan. Home psychiatric medications include Seroquel  and mirtazapine . Family history is significant for schizophrenia. Patient is admitted to the Geropsych unit with every 15 minute safety monitoring. Multidisciplinary team approach is offered. Medication management, group/milieu therapy is offered.   Continued Clinical Symptoms:  Alcohol Use Disorder Identification Test Final Score (AUDIT): 1 The Alcohol Use Disorders Identification Test, Guidelines for Use in Primary Care, Second Edition.  World Science writer [facility]). Score between 0-7:  no or low risk or alcohol related problems. Score between 8-15:  moderate risk of alcohol related problems. Score between 16-19:  high risk of alcohol related problems. Score 20 or above:  warrants further diagnostic evaluation for alcohol dependence and treatment.   CLINICAL FACTORS:   Schizoaffective disorder: bipolar type   Musculoskeletal: Strength & Muscle Tone: within normal limits Gait & Station: unsteady Patient leans: N/A  Psychiatric Specialty Exam:  Presentation  General Appearance:  Casual  Eye Contact: Good  Speech: Clear and Coherent; Normal  Rate  Speech Volume: Normal  Handedness:No data recorded  Mood and Affect  Mood: Euthymic  Affect: Appropriate   Thought Process  Thought Processes: Linear  Descriptions of Associations:Circumstantial  Orientation:Full (Time, Place and Person)  Thought Content:Delusions  History of Schizophrenia/Schizoaffective disorder:Yes  Duration of Psychotic Symptoms:No data recorded Hallucinations:Hallucinations: Visual Description of Visual Hallucinations: bugs in home  Ideas of Reference:Paranoia; Delusions  Suicidal Thoughts:Suicidal Thoughts: No  Homicidal Thoughts:Homicidal Thoughts: No   Sensorium  Memory: Remote Poor; Recent Fair; Immediate Fair  Judgment: Impaired  Insight: Fair   Chartered certified accountant: Fair  Attention Span: Fair  Recall: [employer] of Knowledge: Fair  Language: Fair   Psychomotor Activity  Psychomotor Activity: Psychomotor Activity: Normal   Assets  Assets: Manufacturing systems engineer; Housing; Social Support   Sleep  Sleep: Sleep: Good    Physical Exam: Physical Exam Constitutional:      Appearance: She is obese.  HENT:     Head: Normocephalic and atraumatic.     Nose: Nose normal.     Mouth/Throat:     Mouth: Mucous membranes are moist.  Pulmonary:     Effort: Pulmonary effort is normal.  Neurological:     Mental Status: She is alert.  Psychiatric:        Attention and Perception: Attention normal.        Mood and Affect: Mood is depressed. Mood is not elated. Affect is not labile.        Speech: Speech normal.        Behavior: Behavior is cooperative.        Thought Content: Thought content is paranoid and delusional. Thought content does not include homicidal or suicidal ideation. Thought content does not include homicidal or suicidal plan.        Cognition and Memory: Cognition normal.  Judgment: Judgment normal.    Review of Systems  Psychiatric/Behavioral:  Positive for  depression. Negative for hallucinations and suicidal ideas. The patient does not have insomnia.   All other systems reviewed and are negative.  Blood pressure (!) 112/59, pulse 77, temperature 97.6 F (36.4 C), resp. rate 18, height 5' 7 (1.702 m), weight 121.6 kg, SpO2 98%. Body mass index is 41.97 kg/m.   COGNITIVE FEATURES THAT CONTRIBUTE TO RISK:  None    SUICIDE RISK:   Minimal: No identifiable suicidal ideation.  Patients presenting with no risk factors but with morbid ruminations; may be classified as minimal risk based on the severity of the depressive symptoms  PLAN OF CARE: Patient is admitted to the Geropsych unit with every 15 minute safety monitoring. Multidisciplinary team approach is offered. Medication management, group/milieu therapy is offered.   I certify that inpatient services furnished can reasonably be expected to improve the patient's condition.   Rowdy Guerrini LITTIE Lukes, PA-C 02/25/2024, 3:01 PM

## 2024-02-25 NOTE — Group Note (Signed)
 Recreation Therapy Group Note   Group Topic:Coping Skills  Group Date: 02/25/2024 Start Time: 1400 End Time: 1500 Facilitators: Celestia Jeoffrey BRAVO, LRT, CTRS Location: Courtyard  Group Description: Mind Map.  Patient was provided a blank template of a diagram with 32 blank boxes in a tiered system, branching from the center (similar to a bubble chart). LRT directed patients to label the middle of the diagram Coping Skills. LRT and patients then came up with 8 different coping skills as examples. Pt were directed to record their coping skills in the 2nd tier boxes closest to the center.  Patients would then share their coping skills with the group as LRT wrote them out. LRT gave a handout of 99 different coping skills at the end of group.   Goal Area(s) Addressed: Patients will be able to define "coping skills". Patient will identify new coping skills.  Patient will increase communication.  Affect/Mood: Appropriate   Participation Level: Moderate   Participation Quality: Independent   Behavior: Calm and Cooperative   Speech/Thought Process: Disorganized   Insight: Limited   Judgement: Fair    Modes of Intervention: Education, Exploration, and Worksheet   Patient Response to Interventions:  Receptive   Education Outcome:  In group clarification offered    Clinical Observations/Individualized Feedback: Kathie was mostly active in their participation of session activities and group discussion. Pt identified talking to people as coping skills.    Plan: Continue to engage patient in RT group sessions 2-3x/week.   Jeoffrey BRAVO Celestia, LRT, CTRS 02/25/2024 5:37 PM

## 2024-02-25 NOTE — Progress Notes (Signed)
   02/25/24 2100  Psych Admission Type (Psych Patients Only)  Admission Status Involuntary  Psychosocial Assessment  Patient Complaints None  Eye Contact Fair  Facial Expression Animated  Affect Preoccupied  Speech Logical/coherent  Interaction Assertive  Motor Activity Tremors  Appearance/Hygiene Unremarkable  Behavior Characteristics Cooperative;Appropriate to situation  Mood Pleasant  Thought Process  Coherency WDL  Content WDL  Delusions None reported or observed  Perception WDL  Hallucination None reported or observed  Judgment WDL  Confusion WDL  Danger to Self  Current suicidal ideation? Denies  Agreement Not to Harm Self Yes  Description of Agreement verbal  Danger to Others  Danger to Others None reported or observed

## 2024-02-25 NOTE — Group Note (Signed)
 Date:  02/25/2024 Time:  11:48 PM  Group Topic/Focus:  Wrap-Up Group:   The focus of this group is to help patients review their daily goal of treatment and discuss progress on daily workbooks.    Participation Level:  Active  Participation Quality:  Appropriate  Affect:  Appropriate  Cognitive:  Alert  Insight: Appropriate  Engagement in Group:  Engaged  Modes of Intervention:  Discussion  Additional Comments:    Heidi Young 02/25/2024, 11:48 PM
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
## 2024-02-25 NOTE — Group Note (Signed)
 Date:  02/25/2024 Time:  11:05 AM  Group Topic/Focus:  Self Care:   The focus of this group is to help patients understand the importance of self-care in order to improve or restore emotional, physical, spiritual, interpersonal, and financial health.    Participation Level:  Minimal  Participation Quality:  Appropriate  Affect:  Appropriate  Cognitive:  Alert  Insight: Good  Engagement in Group:  Limited  Modes of Intervention:  Activity  Additional Comments:  N/A  Harlene LITTIE Gavel 02/25/2024, 11:05 AM

## 2024-02-25 NOTE — Progress Notes (Signed)
   02/25/24 1100  Psych Admission Type (Psych Patients Only)  Admission Status Involuntary  Psychosocial Assessment  Patient Complaints Worrying  Eye Contact Fair  Facial Expression Animated  Affect Preoccupied  Speech Logical/coherent  Interaction Assertive  Motor Activity Tremors  Appearance/Hygiene Unremarkable  Behavior Characteristics Cooperative  Mood Pleasant  Thought Process  Coherency WDL  Content WDL  Delusions None reported or observed  Perception WDL  Hallucination None reported or observed  Judgment WDL  Confusion WDL  Danger to Self  Current suicidal ideation? Denies  Agreement Not to Harm Self Yes  Description of Agreement verbal  Danger to Others  Danger to Others None reported or observed

## 2024-02-26 DIAGNOSIS — F250 Schizoaffective disorder, bipolar type: Secondary | ICD-10-CM

## 2024-02-26 LAB — URINALYSIS, COMPLETE (UACMP) WITH MICROSCOPIC
Bacteria, UA: NONE SEEN
Bilirubin Urine: NEGATIVE
Glucose, UA: NEGATIVE mg/dL
Hgb urine dipstick: NEGATIVE
Ketones, ur: NEGATIVE mg/dL
Leukocytes,Ua: NEGATIVE
Nitrite: NEGATIVE
Protein, ur: NEGATIVE mg/dL
Specific Gravity, Urine: 1.009 (ref 1.005–1.030)
pH: 5 (ref 5.0–8.0)

## 2024-02-26 LAB — COMPREHENSIVE METABOLIC PANEL WITH GFR
ALT: 32 U/L (ref 0–44)
AST: 25 U/L (ref 15–41)
Albumin: 3.8 g/dL (ref 3.5–5.0)
Alkaline Phosphatase: 78 U/L (ref 38–126)
Anion gap: 10 (ref 5–15)
BUN: 30 mg/dL — ABNORMAL HIGH (ref 8–23)
CO2: 22 mmol/L (ref 22–32)
Calcium: 9.7 mg/dL (ref 8.9–10.3)
Chloride: 107 mmol/L (ref 98–111)
Creatinine, Ser: 1.67 mg/dL — ABNORMAL HIGH (ref 0.44–1.00)
GFR, Estimated: 31 mL/min — ABNORMAL LOW (ref >60)
Glucose, Bld: 122 mg/dL — ABNORMAL HIGH (ref 70–99)
Potassium: 4.3 mmol/L (ref 3.5–5.1)
Sodium: 139 mmol/L (ref 135–145)
Total Bilirubin: 0.8 mg/dL (ref 0.0–1.2)
Total Protein: 7.2 g/dL (ref 6.5–8.1)

## 2024-02-26 MED ORDER — DIVALPROEX SODIUM ER 500 MG PO TB24
500.0000 mg | ORAL_TABLET | Freq: Every day | ORAL | Status: DC
Start: 2024-02-26 — End: 2024-02-29
  Administered 2024-02-26 – 2024-02-29 (×4): 500 mg via ORAL
  Filled 2024-02-26 (×4): qty 1

## 2024-02-26 NOTE — Plan of Care (Signed)
   Problem: Education: Goal: Knowledge of [facility] Education information/materials will improve Outcome: Progressing Goal: Emotional status will improve Outcome: Progressing Goal: Mental status will improve Outcome: Progressing Goal: Verbalization of understanding the information provided will improve Outcome: Progressing

## 2024-02-26 NOTE — Progress Notes (Signed)
 [facility] MD Progress Note  02/26/2024 4:12 PM Heidi Young  MRN:  [id number] Heidi Young is a 78 y.o. female admitted: Presented to the ED by [facility] 02/18/2024 10:38 PM for IVC. She carries the psychiatric diagnoses of Schizoaffective disorder  with paranoid delusions. She meets criteria for schizophrenia based on a history of fixed false beliefs (e.g., voodoo-related delusions) and ongoing psychotic symptoms that have caused functional impairment over an extended period. Patient is admitted to [facility] unit with Q15 min safety monitoring. Multidisciplinary team approach is offered. Medication management; group/milieu therapy is offered.   Subjective:  Chart reviewed, case discussed in multidisciplinary meeting, patient seen during rounds.  On interview patient is noted to be resting in bed.  She reports feeling tired and having pain.  She remains focused on her physical symptoms and demands to have a sitter or to be transferred to a medical floor.  Patient was educated about medical clearance that happened before patient was admitted to the psychiatric unit.  Provider offered to check her urine and labs to make sure she is doing okay per nursing patient wanting to stay in the room and refusing to come into the day area stating she is unable to do anything by herself and states she is incontinent and wants the nurse to help her with her ADLs.  She denies SI/HI/plan and denies hallucinations.  Patient was irritable and angry towards the staff members asking them to leave her room.   Sleep: Fair  Appetite:  Poor  Past Psychiatric History: see h&P Family History:  Family History  Problem Relation Age of Onset   Coronary artery disease Mother 79   Heart failure Mother        congestive   Stroke Mother    Brain cancer Father 56   Hypertension Other        siblings   Aortic stenosis Other        sibling   Social History:  Social History   Substance and Sexual Activity  Alcohol Use No    Comment: denies     Social History   Substance and Sexual Activity  Drug Use Yes   Frequency: 1.0 times per week   Types: Marijuana   Comment: due to her chronic pain- last use 03/22/2013    Social History   Socioeconomic History   Marital status: Divorced    Spouse name: Not on file   Number of children: Not on file   Years of education: Not on file   Highest education level: Not on file  Occupational History   Occupation: disability  Tobacco Use   Smoking status: Never    Passive exposure: Never   Smokeless tobacco: Not on file  Vaping Use   Vaping status: Not on file  Substance and Sexual Activity   Alcohol use: No    Comment: denies   Drug use: Yes    Frequency: 1.0 times per week    Types: Marijuana    Comment: due to her chronic pain- last use 03/22/2013   Sexual activity: Never    Birth control/protection: Abstinence  Other Topics Concern   Not on file  Social History Narrative   Not on file   Social Drivers of Health   Financial Resource Strain: Not on file  Food Insecurity: No Food Insecurity (02/24/2024)   Hunger Vital Sign    Worried About Running Out of Food in the Last Year: Never true    Ran Out of Food in the Last  Year: Never true  Recent Concern: Food Insecurity - Food Insecurity Present (02/11/2024)   Hunger Vital Sign    Worried About Running Out of Food in the Last Year: Sometimes true    Ran Out of Food in the Last Year: Sometimes true  Transportation Needs: No Transportation Needs (02/24/2024)   PRAPARE - Administrator, Civil Service (Medical): No    Lack of Transportation (Non-Medical): No  Physical Activity: Not on file  Stress: Not on file  Social Connections: Moderately Isolated (02/24/2024)   Social Connection and Isolation Panel    Frequency of Communication with Friends and Family: More than three times a week    Frequency of Social Gatherings with Friends and Family: Never    Attends Religious Services: More than 4 times per  year    Active Member of [employer] or Organizations: No    Attends Engineer, structural: Never    Marital Status: Divorced   Past Medical History:  Past Medical History:  Diagnosis Date   Asthma    Bipolar 1 disorder (HCC)    Cancer (HCC)    renal cell carcinoma   Cardiomyopathy    Carotid bruit    left   Chest pain    Chronic anemia    DJD (degenerative joint disease)    Fibromyalgia    GERD (gastroesophageal reflux disease)    on rare occas. - uses peptobismol   Heart murmur    History of kidney cancer    unknown type. had R kidney removed b/c of CA.    HLD (hyperlipidemia)    HTN (hypertension)    Kidney stones    Lumbar herniated disc    Obesity    Psychosis (HCC)    Schizophrenia (HCC)    Single kidney    Takotsubo cardiomyopathy    UTI (lower urinary tract infection)     Past Surgical History:  Procedure Laterality Date   BREAST BIOPSY Right 10/24/2022   US  RT BREAST BX W LOC DEV 1ST LESION IMG BX SPEC US  GUIDE 10/24/2022 [facility] MAMMOGRAPHY   BREAST LUMPECTOMY  90's   left, cyst   CARDIAC CATHETERIZATION  2010   no intervention   CESAREAN SECTION  1980   x1   CYSTECTOMY  90's   back (left)   NEPHRECTOMY  1990's   right, secondary to cyst   TOTAL KNEE ARTHROPLASTY  2005   right   TOTAL KNEE ARTHROPLASTY Left 03/25/2013   Procedure: TOTAL KNEE ARTHROPLASTY;  Surgeon: Norleen LITTIE Gavel, MD;  Location: [facility] OR;  Service: Orthopedics;  Laterality: Left;    Current Medications: Current Facility-Administered Medications  Medication Dose Route Frequency Provider Last Rate Last Admin   acetaminophen  (TYLENOL ) tablet 650 mg  650 mg Oral Q6H PRN Lord, Jamison Y, NP       albuterol  (PROVENTIL ) (2.5 MG/3ML) 0.083% nebulizer solution 3 mL  3 mL Inhalation 5 X Daily PRN Jacquetta Sharlot GRADE, NP       alum & mag hydroxide-simeth (MAALOX/MYLANTA) 200-200-20 MG/5ML suspension 30 mL  30 mL Oral Q4H PRN Jacquetta Sharlot GRADE, NP       amLODipine  (NORVASC ) tablet 10 mg  10 mg Oral Daily  Lord, Jamison Y, NP   10 mg at 02/26/24 9083   fluticasone  furoate-vilanterol (BREO ELLIPTA ) 100-25 MCG/ACT 1 puff  1 puff Inhalation Daily Jacquetta Sharlot GRADE, NP   1 puff at 02/26/24 0916   folic acid  (FOLVITE ) tablet 1 mg  1 mg Oral Daily  Jacquetta Sharlot GRADE, NP   1 mg at 02/26/24 9083   gabapentin  (NEURONTIN ) capsule 300 mg  300 mg Oral Q8H Jacquetta Sharlot GRADE, NP   300 mg at 02/26/24 1409   metoprolol  tartrate (LOPRESSOR ) tablet 50 mg  50 mg Oral Q8H Lord, Jamison Y, NP   50 mg at 02/26/24 1409   mirabegron  ER (MYRBETRIQ ) tablet 25 mg  25 mg Oral Daily PRN Lord, Jamison Y, NP       mirtazapine  (REMERON ) tablet 45 mg  45 mg Oral QHS Jacquetta Sharlot GRADE, NP   45 mg at 02/25/24 2142   OLANZapine  (ZYPREXA ) tablet 5 mg  5 mg Oral BID PRN Jacquetta Sharlot GRADE, NP       Or   OLANZapine  (ZYPREXA ) injection 5 mg  5 mg Intramuscular BID PRN Lord, Jamison Y, NP       pantoprazole  (PROTONIX ) EC tablet 40 mg  40 mg Oral Daily Lord, Jamison Y, NP   40 mg at 02/26/24 0916   polyethylene glycol (MIRALAX  / GLYCOLAX ) packet 17 g  17 g Oral Daily PRN Jacquetta Sharlot GRADE, NP       QUEtiapine  (SEROQUEL  XR) 24 hr tablet 150 mg  150 mg Oral QHS Lord, Jamison Y, NP   150 mg at 02/25/24 2142   senna-docusate (Senokot-S) tablet 2 tablet  2 tablet Oral Daily PRN Lord, Jamison Y, NP   2 tablet at 02/25/24 1414   traMADol  (ULTRAM ) tablet 50 mg  50 mg Oral Q8H PRN Lord, Jamison Y, NP   50 mg at 02/25/24 2142    Lab Results: No results found for this or any previous visit (from the past 48 hours).  Blood Alcohol level:  Lab Results  Component Value Date   [facility] <11 03/11/2014   ETH <11 03/09/2014    Metabolic Disorder Labs: Lab Results  Component Value Date   HGBA1C  03/04/2009    5.1 (NOTE) The ADA recommends the following therapeutic goal for glycemic control related to Hgb A1c measurement: Goal of therapy: <6.5 Hgb A1c  Reference: American Diabetes Association: Clinical Practice Recommendations 2010, Diabetes Care, 2010, 33: (Suppl   1).   MPG 100 03/04/2009   No results found for: PROLACTIN Lab Results  Component Value Date   CHOL 211 (H) 09/14/2009   TRIG 70.0 09/14/2009   HDL 64.80 09/14/2009   CHOLHDL 3 09/14/2009   VLDL 14.0 09/14/2009   LDLCALC (H) 03/04/2009    145        Total Cholesterol/HDL:CHD Risk Coronary Heart Disease Risk Table                     Men   Women  1/2 Average Risk   3.4   3.3  Average Risk       5.0   4.4  2 X Average Risk   9.6   7.1  3 X Average Risk  23.4   11.0        Use the calculated Patient Ratio above and the CHD Risk Table to determine the patient's CHD Risk.        ATP III CLASSIFICATION (LDL):  <100     mg/dL   Optimal  899-870  mg/dL   Near or Above                    Optimal  130-159  mg/dL   Borderline  839-810  mg/dL   High  >809  mg/dL   Very High    Physical Findings: AIMS:  , ,  ,  ,    CIWA:    COWS:      Psychiatric Specialty Exam:  Presentation  General Appearance:  Casual  Eye Contact: Good  Speech: Clear and Coherent; Normal Rate  Speech Volume: Normal    Mood and Affect  Mood: Euthymic  Affect: Appropriate   Thought Process  Thought Processes: Linear  Descriptions of Associations:Circumstantial  Orientation:Full (Time, Place and Person)  Thought Content:Delusions  Hallucinations:Hallucinations: Visual Description of Visual Hallucinations: bugs in home  Ideas of Reference:Paranoia; Delusions  Suicidal Thoughts:Suicidal Thoughts: No  Homicidal Thoughts:Homicidal Thoughts: No   Sensorium  Memory: Remote Poor; Recent Fair; Immediate Fair  Judgment: Impaired  Insight: Fair   Chartered certified accountant: Fair  Attention Span: Fair  Recall: [employer] of Knowledge: Fair  Language: Fair   Psychomotor Activity  Psychomotor Activity: Psychomotor Activity: Normal  Musculoskeletal: Strength & Muscle Tone: decreased Gait & Station: unsteady Assets  Assets: Investment banker, corporate; Housing; Social Support    Physical Exam: Physical Exam ROS Blood pressure 129/61, pulse 81, temperature (!) 97.5 F (36.4 C), resp. rate 18, height 5' 7 (1.702 m), weight 121.6 kg, SpO2 100%. Body mass index is 41.97 kg/m.  Diagnosis: Principal Problem:   Schizoaffective disorder, bipolar type [facility]) Clinical Decision Making: Patient with history of schizoaffective disorder, bipolar type admitted to the inpatient unit for visual hallucinations, paranoid delusions, worsening depressed mood, and anxiety. Patient needs to be monitored closely for safety.    Treatment Plan Summary:   Safety and Monitoring:             -- Voluntary admission to inpatient psychiatric unit for safety, stabilization and treatment             -- Daily contact with patient to assess and evaluate symptoms and progress in treatment             -- Patient's case to be discussed in multi-disciplinary team meeting             -- Observation Level: q15 minute checks             -- Vital signs:  q12 hours             -- Precautions: suicide, elopement, and assault   2. Psychiatric Diagnoses and Treatment: Schizoaffective disorder, bipolar type.    Continue mirtazapine  45 mg at bedtime Continue Seroquel  XR 150 mg once at bedtime     -patient is noted to be more irritable will consider adding a mood stabilizer possibly Depakote             -- The risks/benefits/side-effects/alternatives to this medication were discussed in detail with the patient and time was given for questions. The patient consents to medication trial.                -- Metabolic profile and EKG monitoring obtained while on an atypical antipsychotic (BMI: Lipid Panel: HbgA1c: QTc:)              -- Encouraged patient to participate in unit milieu and in scheduled group therapies                            3. Medical Issues Being Addressed:   Continue amlodipine  10 mg once daily and metoprolol  50 mg every 8 hours for hypertension   4.  Discharge Planning:   --  Social work and case management to assist with discharge planning and identification of hospital follow-up needs prior to discharge  -- Estimated LOS: 3-4 days  Heidi Weesner, MD 02/26/2024, 4:12 PM

## 2024-02-26 NOTE — Group Note (Signed)
 Date:  02/26/2024 Time:  8:55 PM  Group Topic/Focus:  Coping With Mental Health Crisis:   The purpose of this group is to help patients identify strategies for coping with mental health crisis.  Group discusses possible causes of crisis and ways to manage them effectively. We also discussed rules and expectations for the unit and did a word search of their choice.    Participation Level:  Active  Participation Quality:  Appropriate  Affect:  Appropriate  Cognitive:  Appropriate  Insight: Appropriate  Engagement in Group:  Engaged  Modes of Intervention:  Discussion  Additional Comments:    Leigh VEAR Pais 02/26/2024, 8:55 PM

## 2024-02-26 NOTE — [facility] Suicide Risk Assessment (Signed)
 [facility] INPATIENT:  Family/Significant Other Suicide Prevention Education  Suicide Prevention Education:  Education Completed; Jelani Gray, [phone]  ,  (name of family member/significant other) has been identified by the patient as the family member/significant other with whom the patient will be residing, and identified as the person(s) who will aid the patient in the event of a mental health crisis (suicidal ideations/suicide attempt).  With written consent from the patient, the family member/significant other has been provided the following suicide prevention education, prior to the and/or following the discharge of the patient.  The suicide prevention education provided includes the following: Suicide risk factors Suicide prevention and interventions National Suicide Hotline telephone number [facility] assessment telephone number [facility] Emergency Assistance 911 [facility] and/or Residential Mobile Crisis Unit telephone number  Request made of family/significant other to: Remove weapons (e.g., guns, rifles, knives), all items previously/currently identified as safety concern.   Remove drugs/medications (over-the-counter, prescriptions, illicit drugs), all items previously/currently identified as a safety concern.  The family member/significant other verbalizes understanding of the suicide prevention education information provided.  The family member/significant other agrees to remove the items of safety concern listed above.  Reports pt has not been under a psychiatric provider for a few years. Reports that he has been trying to get her attention from medical professionals for a couple of weeks.   Reports when pt is medicated she is not a danger to herself.  Reports her grandson was staying with her but he moved out a few weeks ago.   Reports he would like someone to come check on his mom.   She can maintain herself 100% but I'm just concerned about her  medication   Reports he would like pt to call him because he has not heard from her yet.   Reports he would like his cousin who is a Child psychotherapist, Johnetta Reena Mace, to be apart of the aftercare planning    [name] 02/26/2024, 11:47 AM

## 2024-02-26 NOTE — Group Note (Signed)
 Date:  02/26/2024 Time:  10:59 AM  Group Topic/Focus:  Fresh air therapy with music and cards    Participation Level:  Did Not Attend    [name] Bias 02/26/2024, 10:59 AM

## 2024-02-26 NOTE — Group Note (Unsigned)
 Date:  02/26/2024 Time:  10:52 AM  Group Topic/Focus:  Fresh air therapy with music and cards     Participation Level:  {B[facility] PARTICIPATION OZCZO:77735}  Participation Quality:  {B[facility] PARTICIPATION QUALITY:22265}  Affect:  {BHH AFFECT:22266}  Cognitive:  {B[facility] COGNITIVE:22267}  Insight: {BHH Insight2:20797}  Engagement in Group:  {B[facility] ENGAGEMENT IN HMNLE:77731}  Modes of Intervention:  {B[facility] MODES OF INTERVENTION:22269}  Additional Comments:  ***  [name] Bias 02/26/2024, 10:52 AM

## 2024-02-26 NOTE — Plan of Care (Signed)

## 2024-02-26 NOTE — [facility] Counselor (Signed)
 CSW contacted Jelani Gray [phone]) as Jelani had asked CSW to give pt a message when CSW completed the suicide education with pt's son.   CSW gave pt his phone number and pt's code and asked pt to call him [place]).   Pt reports she cannot call because it makes her dizzy.   CSW informed medical staff and asked if medical staff could assist pt with call.   CSW then reached out to Jelani as CSW knew pt was unable to make contact at this time because of agitation.   Jelani reports pt becomes more agitated when she does not hear from him.   CSW asks for a time that provider and CSW can make contact with Jelani to discuss treatment goals.   Jelani reports he will text CSW a time because he wants to ensure that his cousin who is a Child psychotherapist (Apostle Reena Mace) approves on decisions that he is making.   CSW will inform provider of scheduling of the family meeting  Lum Croft, MSW, [facility] 02/26/2024 3:41 PM

## 2024-02-26 NOTE — BH IP Treatment Plan (Signed)
 Interdisciplinary Treatment and Diagnostic Plan Update  02/26/2024 Time of Session: 3:07 PM  Heidi Young MRN: [id number]  Principal Diagnosis: Schizoaffective disorder, bipolar type (HCC)  Secondary Diagnoses: Principal Problem:   Schizoaffective disorder, bipolar type (HCC)   Current Medications:  Current Facility-Administered Medications  Medication Dose Route Frequency Provider Last Rate Last Admin   acetaminophen  (TYLENOL ) tablet 650 mg  650 mg Oral Q6H PRN Lord, Jamison Y, NP       albuterol  (PROVENTIL ) (2.5 MG/3ML) 0.083% nebulizer solution 3 mL  3 mL Inhalation 5 X Daily PRN Jacquetta Sharlot GRADE, NP       alum & mag hydroxide-simeth (MAALOX/MYLANTA) 200-200-20 MG/5ML suspension 30 mL  30 mL Oral Q4H PRN Jacquetta Sharlot GRADE, NP       amLODipine  (NORVASC ) tablet 10 mg  10 mg Oral Daily Jacquetta Sharlot GRADE, NP   10 mg at 02/26/24 9083   fluticasone  furoate-vilanterol (BREO ELLIPTA ) 100-25 MCG/ACT 1 puff  1 puff Inhalation Daily Jacquetta Sharlot GRADE, NP   1 puff at 02/26/24 0916   folic acid  (FOLVITE ) tablet 1 mg  1 mg Oral Daily Jacquetta Sharlot GRADE, NP   1 mg at 02/26/24 9083   gabapentin  (NEURONTIN ) capsule 300 mg  300 mg Oral Q8H Jacquetta Sharlot GRADE, NP   300 mg at 02/26/24 1409   metoprolol  tartrate (LOPRESSOR ) tablet 50 mg  50 mg Oral Q8H Jacquetta Sharlot GRADE, NP   50 mg at 02/26/24 1409   mirabegron  ER (MYRBETRIQ ) tablet 25 mg  25 mg Oral Daily PRN Jacquetta Sharlot GRADE, NP       mirtazapine  (REMERON ) tablet 45 mg  45 mg Oral QHS Jacquetta Sharlot GRADE, NP   45 mg at 02/25/24 2142   OLANZapine  (ZYPREXA ) tablet 5 mg  5 mg Oral BID PRN Jacquetta Sharlot GRADE, NP       Or   OLANZapine  (ZYPREXA ) injection 5 mg  5 mg Intramuscular BID PRN Lord, Jamison Y, NP       pantoprazole  (PROTONIX ) EC tablet 40 mg  40 mg Oral Daily Lord, Jamison Y, NP   40 mg at 02/26/24 9083   polyethylene glycol (MIRALAX  / GLYCOLAX ) packet 17 g  17 g Oral Daily PRN Jacquetta Sharlot GRADE, NP       QUEtiapine  (SEROQUEL  XR) 24 hr tablet 150 mg  150 mg Oral QHS  Lord, Jamison Y, NP   150 mg at 02/25/24 2142   senna-docusate (Senokot-S) tablet 2 tablet  2 tablet Oral Daily PRN Lord, Jamison Y, NP   2 tablet at 02/25/24 1414   traMADol  (ULTRAM ) tablet 50 mg  50 mg Oral Q8H PRN Lord, Jamison Y, NP   50 mg at 02/25/24 2142   PTA Medications: Medications Prior to Admission  Medication Sig Dispense Refill Last Dose/Taking   acetaminophen  (TYLENOL ) 325 MG tablet Take 2 tablets (650 mg total) by mouth every 6 (six) hours as needed for mild pain (pain score 1-3) or fever (or Fever >/= 101).      albuterol  (PROVENTIL  HFA;VENTOLIN  HFA) 108 (90 BASE) MCG/ACT inhaler Inhale 2 puffs into the lungs 5 (five) times daily as needed for wheezing or shortness of breath.      albuterol  (PROVENTIL ) (2.5 MG/3ML) 0.083% nebulizer solution Take 2.5 mg by nebulization every 4 (four) hours as needed for wheezing or shortness of breath.      amLODipine  (NORVASC ) 10 MG tablet Take 10 mg by mouth daily.      atorvastatin  (LIPITOR) 20 MG tablet Take 20  mg by mouth at bedtime.      bisacodyl  (DULCOLAX) 10 MG suppository Place 1 suppository (10 mg total) rectally daily as needed for severe constipation (severe). 12 suppository 0    CONSTULOSE  10 GM/15ML solution Take 20 g by mouth daily as needed for moderate constipation.      EPINEPHrine (EPIPEN) 0.3 mg/0.3 mL IJ SOAJ injection Inject 0.3 mg into the muscle as needed (allergic reaction).      folic acid  (FOLVITE ) 1 MG tablet Take 1 mg by mouth daily.      furosemide  (LASIX ) 40 MG tablet Take 40 mg by mouth daily as needed (for fluid retention.).      gabapentin  (NEURONTIN ) 300 MG capsule Take 300 mg by mouth 3 (three) times daily.      levocetirizine (XYZAL) 5 MG tablet Take 5 mg by mouth every evening.      meclizine  (ANTIVERT ) 25 MG tablet Take 1 tablet (25 mg total) by mouth 3 (three) times daily as needed. (Patient taking differently: Take 25 mg by mouth 3 (three) times daily as needed for dizziness.) 30 tablet 0    melatonin 3 MG  TABS tablet Take 3 mg by mouth at bedtime.      metoprolol  tartrate (LOPRESSOR ) 50 MG tablet Take 50 mg by mouth 3 (three) times daily.      mirabegron  ER (MYRBETRIQ ) 25 MG TB24 tablet Take 1 tablet (25 mg total) by mouth daily as needed (for urinary frequency with lasix ). 20 tablet 0    mirtazapine  (REMERON ) 45 MG tablet Take 45 mg by mouth at bedtime.      mometasone -formoterol  (DULERA ) 100-5 MCG/ACT AERO Inhale 2 puffs into the lungs 2 (two) times daily as needed for wheezing or shortness of breath.      ondansetron  (ZOFRAN -ODT) 4 MG disintegrating tablet Take 4 mg by mouth every 6 (six) hours as needed for nausea or vomiting.      pantoprazole  (PROTONIX ) 40 MG tablet Take 1 tablet (40 mg total) by mouth daily at 6 (six) AM. 30 tablet 1    polyethylene glycol (MIRALAX  / GLYCOLAX ) 17 g packet Take 17 g by mouth 2 (two) times daily. 14 each 0    potassium chloride  SA (K-DUR,KLOR-CON ) 20 MEQ tablet Take 20 mEq by mouth daily as needed (takes with lasix  for fluid retention).      QUEtiapine  Fumarate (SEROQUEL  XR) 150 MG 24 hr tablet Take 150 mg by mouth at bedtime.      tiZANidine  (ZANAFLEX ) 4 MG tablet Take 4 mg by mouth every 8 (eight) hours as needed for muscle spasms.      traMADol  (ULTRAM ) 50 MG tablet Take 50 mg by mouth 3 (three) times daily as needed (pain).      VITAMIN D , CHOLECALCIFEROL , PO Take 1 tablet by mouth daily.       Patient Stressors:    Patient Strengths:    Treatment Modalities: Medication Management, Group therapy, Case management,  1 to 1 session with clinician, Psychoeducation, Recreational therapy.   Physician Treatment Plan for Primary Diagnosis: Schizoaffective disorder, bipolar type (HCC) Long Term Goal(s): Improvement in symptoms so as ready for discharge   Short Term Goals: Ability to identify changes in lifestyle to reduce recurrence of condition will improve Ability to verbalize feelings will improve Ability to identify and develop effective coping  behaviors will improve Ability to maintain clinical measurements within normal limits will improve  Medication Management: Evaluate patient's response, side effects, and tolerance of medication regimen.  Therapeutic Interventions:  1 to 1 sessions, Unit Group sessions and Medication administration.  Evaluation of Outcomes: Not Progressing  Physician Treatment Plan for Secondary Diagnosis: Principal Problem:   Schizoaffective disorder, bipolar type (HCC)  Long Term Goal(s): Improvement in symptoms so as ready for discharge   Short Term Goals: Ability to identify changes in lifestyle to reduce recurrence of condition will improve Ability to verbalize feelings will improve Ability to identify and develop effective coping behaviors will improve Ability to maintain clinical measurements within normal limits will improve     Medication Management: Evaluate patient's response, side effects, and tolerance of medication regimen.  Therapeutic Interventions: 1 to 1 sessions, Unit Group sessions and Medication administration.  Evaluation of Outcomes: Not Progressing   RN Treatment Plan for Primary Diagnosis: Schizoaffective disorder, bipolar type (HCC) Long Term Goal(s): Knowledge of disease and therapeutic regimen to maintain health will improve  Short Term Goals: Ability to remain free from injury will improve, Ability to verbalize frustration and anger appropriately will improve, Ability to demonstrate self-control, Ability to participate in decision making will improve, Ability to verbalize feelings will improve, Ability to disclose and discuss suicidal ideas, Ability to identify and develop effective coping behaviors will improve, and Compliance with prescribed medications will improve  Medication Management: RN will administer medications as ordered by provider, will assess and evaluate patient's response and provide education to patient for prescribed medication. RN will report any adverse  and/or side effects to prescribing provider.  Therapeutic Interventions: 1 on 1 counseling sessions, Psychoeducation, Medication administration, Evaluate responses to treatment, Monitor vital signs and CBGs as ordered, Perform/monitor CIWA, COWS, AIMS and Fall Risk screenings as ordered, Perform wound care treatments as ordered.  Evaluation of Outcomes: Not Progressing   LCSW Treatment Plan for Primary Diagnosis: Schizoaffective disorder, bipolar type (HCC) Long Term Goal(s): Safe transition to appropriate next level of care at discharge, Engage patient in therapeutic group addressing interpersonal concerns.  Short Term Goals: Engage patient in aftercare planning with referrals and resources, Increase social support, Increase ability to appropriately verbalize feelings, Increase emotional regulation, Facilitate acceptance of mental health diagnosis and concerns, Facilitate patient progression through stages of change regarding substance use diagnoses and concerns, Identify triggers associated with mental health/substance abuse issues, and Increase skills for wellness and recovery  Therapeutic Interventions: Assess for all discharge needs, 1 to 1 time with Social worker, Explore available resources and support systems, Assess for adequacy in community support network, Educate family and significant other(s) on suicide prevention, Complete Psychosocial Assessment, Interpersonal group therapy.  Evaluation of Outcomes: Not Progressing   Progress in Treatment: Attending groups: Yes. and No. Participating in groups: Yes. and No. Taking medication as prescribed: Yes. Toleration medication: Yes. Family/Significant other contact made: Yes, individual(s) contacted:  Jelani Gray, son  Patient understands diagnosis: No. Discussing patient identified problems/goals with staff: No. Medical problems stabilized or resolved: Yes. Denies suicidal/homicidal ideation: Yes. Issues/concerns per patient  self-inventory: No. Other: None   New problem(s) identified: No, Describe:  None identified   New Short Term/Long Term Goal(s): elimination of symptoms of psychosis, medication management for mood stabilization; elimination of SI thoughts; development of comprehensive mental wellness plan.   Patient Goals:  Pt unable to identify goals at this time   Discharge Plan or Barriers: CSW will assist with appropriate discharge planning   Reason for Continuation of Hospitalization: Delusions  Medication stabilization  Estimated Length of Stay: 1 to 7 days  Last 3 [place] Suicide Severity Risk Score: Flowsheet Row Admission (Current) from 02/24/2024 in  [facility] ED to Hosp-Admission (Discharged) from 02/18/2024 in [facility] [place] [facility] ED from 02/16/2024 in [facility]  C-SSRS RISK CATEGORY No Risk No Risk No Risk    Last [facility] 2/9 Scores:     No data to display          Scribe for Treatment Team: Lum JONETTA Raynaldo ISRAEL 02/26/2024 3:51 PM

## 2024-02-26 NOTE — [facility] Suicide Risk Assessment (Signed)
 [facility] INPATIENT:  Family/Significant Other Suicide Prevention Education  Suicide Prevention Education:  Contact Attempts: Heidi Young, son, [phone] , (name of family member/significant other) has been identified by the patient as the family member/significant other with whom the patient will be residing, and identified as the person(s) who will aid the patient in the event of a mental health crisis.  With written consent from the patient, two attempts were made to provide suicide prevention education, prior to and/or following the patient's discharge.  We were unsuccessful in providing suicide prevention education.  A suicide education pamphlet was given to the patient to share with family/significant other.  Date and time of first attempt: 02/26/2024 at 11:42 AM   Heidi Young 02/26/2024, 11:42 AM

## 2024-02-27 DIAGNOSIS — F250 Schizoaffective disorder, bipolar type: Secondary | ICD-10-CM | POA: Diagnosis not present

## 2024-02-27 NOTE — BHH Counselor (Incomplete)
 CSW contacted pt's son Gaylen Pereyra ([phone]) per his request with provider Dr.J.  Jelani added his cousin, Reena Mace to the call.   Dr.J discussed medication changes made as well as pt's mood changes.

## 2024-02-27 NOTE — Progress Notes (Signed)
 [facility] MD Progress Note  02/27/2024 9:19 PM Heidi Young  MRN:  [id number] Heidi Young is a 78 y.o. female admitted: Presented to the ED by [facility] 02/18/2024 10:38 PM for IVC. She carries the psychiatric diagnoses of Schizoaffective disorder  with paranoid delusions. She meets criteria for schizophrenia based on a history of fixed false beliefs (e.g., voodoo-related delusions) and ongoing psychotic symptoms that have caused functional impairment over an extended period. Patient is admitted to [facility] unit with Q15 min safety monitoring. Multidisciplinary team approach is offered. Medication management; group/milieu therapy is offered.   Subjective:  Chart reviewed, case discussed in multidisciplinary meeting, patient seen during rounds.  Patient is noted to be resting in bed.  Per nursing report she is calmer today and came out of her room for breakfast and lunch.  She is taking her medications with no reported side effects.  He is not endorsing SI/HI/plan and not responding to internal stimuli  Sleep: Fair  Appetite:  Poor  Past Psychiatric History: see h&P Family History:  Family History  Problem Relation Age of Onset   Coronary artery disease Mother 82   Heart failure Mother        congestive   Stroke Mother    Brain cancer Father 26   Hypertension Other        siblings   Aortic stenosis Other        sibling   Social History:  Social History   Substance and Sexual Activity  Alcohol Use No   Comment: denies     Social History   Substance and Sexual Activity  Drug Use Yes   Frequency: 1.0 times per week   Types: Marijuana   Comment: due to her chronic pain- last use 03/22/2013    Social History   Socioeconomic History   Marital status: Divorced    Spouse name: Not on file   Number of children: Not on file   Years of education: Not on file   Highest education level: Not on file  Occupational History   Occupation: disability  Tobacco Use   Smoking status:  Never    Passive exposure: Never   Smokeless tobacco: Not on file  Vaping Use   Vaping status: Not on file  Substance and Sexual Activity   Alcohol use: No    Comment: denies   Drug use: Yes    Frequency: 1.0 times per week    Types: Marijuana    Comment: due to her chronic pain- last use 03/22/2013   Sexual activity: Never    Birth control/protection: Abstinence  Other Topics Concern   Not on file  Social History Narrative   Not on file   Social Drivers of Health   Financial Resource Strain: Not on file  Food Insecurity: No Food Insecurity (02/24/2024)   Hunger Vital Sign    Worried About Running Out of Food in the Last Year: Never true    Ran Out of Food in the Last Year: Never true  Recent Concern: Food Insecurity - Food Insecurity Present (02/11/2024)   Hunger Vital Sign    Worried About Running Out of Food in the Last Year: Sometimes true    Ran Out of Food in the Last Year: Sometimes true  Transportation Needs: No Transportation Needs (02/24/2024)   PRAPARE - Administrator, Civil Service (Medical): No    Lack of Transportation (Non-Medical): No  Physical Activity: Not on file  Stress: Not on file  Social Connections: Moderately  Isolated (02/24/2024)   Social Connection and Isolation Panel    Frequency of Communication with Friends and Family: More than three times a week    Frequency of Social Gatherings with Friends and Family: Never    Attends Religious Services: More than 4 times per year    Active Member of [employer] or Organizations: No    Attends Engineer, structural: Never    Marital Status: Divorced   Past Medical History:  Past Medical History:  Diagnosis Date   Asthma    Bipolar 1 disorder (HCC)    Cancer (HCC)    renal cell carcinoma   Cardiomyopathy    Carotid bruit    left   Chest pain    Chronic anemia    DJD (degenerative joint disease)    Fibromyalgia    GERD (gastroesophageal reflux disease)    on rare occas. - uses  peptobismol   Heart murmur    History of kidney cancer    unknown type. had R kidney removed b/c of CA.    HLD (hyperlipidemia)    HTN (hypertension)    Kidney stones    Lumbar herniated disc    Obesity    Psychosis (HCC)    Schizophrenia (HCC)    Single kidney    Takotsubo cardiomyopathy    UTI (lower urinary tract infection)     Past Surgical History:  Procedure Laterality Date   BREAST BIOPSY Right 10/24/2022   US  RT BREAST BX W LOC DEV 1ST LESION IMG BX SPEC US  GUIDE 10/24/2022 [facility] MAMMOGRAPHY   BREAST LUMPECTOMY  90's   left, cyst   CARDIAC CATHETERIZATION  2010   no intervention   CESAREAN SECTION  1980   x1   CYSTECTOMY  90's   back (left)   NEPHRECTOMY  1990's   right, secondary to cyst   TOTAL KNEE ARTHROPLASTY  2005   right   TOTAL KNEE ARTHROPLASTY Left 03/25/2013   Procedure: TOTAL KNEE ARTHROPLASTY;  Surgeon: Norleen LITTIE Gavel, MD;  Location: [facility] OR;  Service: Orthopedics;  Laterality: Left;    Current Medications: Current Facility-Administered Medications  Medication Dose Route Frequency Provider Last Rate Last Admin   acetaminophen  (TYLENOL ) tablet 650 mg  650 mg Oral Q6H PRN Jacquetta Sharlot GRADE, NP       albuterol  (PROVENTIL ) (2.5 MG/3ML) 0.083% nebulizer solution 3 mL  3 mL Inhalation 5 X Daily PRN Jacquetta Sharlot GRADE, NP       alum & mag hydroxide-simeth (MAALOX/MYLANTA) 200-200-20 MG/5ML suspension 30 mL  30 mL Oral Q4H PRN Jacquetta Sharlot GRADE, NP       amLODipine  (NORVASC ) tablet 10 mg  10 mg Oral Daily Lord, Jamison Y, NP   10 mg at 02/27/24 9077   divalproex  (DEPAKOTE  ER) 24 hr tablet 500 mg  500 mg Oral Daily Marguriete Wootan, MD   500 mg at 02/27/24 9077   fluticasone  furoate-vilanterol (BREO ELLIPTA ) 100-25 MCG/ACT 1 puff  1 puff Inhalation Daily Jacquetta Sharlot GRADE, NP   1 puff at 02/27/24 9077   folic acid  (FOLVITE ) tablet 1 mg  1 mg Oral Daily Lord, Jamison Y, NP   1 mg at 02/27/24 9077   gabapentin  (NEURONTIN ) capsule 300 mg  300 mg Oral Q8H Jacquetta Sharlot GRADE, NP   300  mg at 02/27/24 2107   metoprolol  tartrate (LOPRESSOR ) tablet 50 mg  50 mg Oral Q8H Lord, Jamison Y, NP   50 mg at 02/27/24 2107   mirabegron  ER (MYRBETRIQ ) tablet  25 mg  25 mg Oral Daily PRN Lord, Jamison Y, NP       mirtazapine  (REMERON ) tablet 45 mg  45 mg Oral QHS Lord, Jamison Y, NP   45 mg at 02/27/24 2107   OLANZapine  (ZYPREXA ) tablet 5 mg  5 mg Oral BID PRN Jacquetta Sharlot GRADE, NP       Or   OLANZapine  (ZYPREXA ) injection 5 mg  5 mg Intramuscular BID PRN Jacquetta Sharlot GRADE, NP       pantoprazole  (PROTONIX ) EC tablet 40 mg  40 mg Oral Daily Jacquetta Sharlot GRADE, NP   40 mg at 02/27/24 9077   polyethylene glycol (MIRALAX  / GLYCOLAX ) packet 17 g  17 g Oral Daily PRN Jacquetta Sharlot GRADE, NP       QUEtiapine  (SEROQUEL  XR) 24 hr tablet 150 mg  150 mg Oral QHS Jacquetta Sharlot GRADE, NP   150 mg at 02/27/24 2107   senna-docusate (Senokot-S) tablet 2 tablet  2 tablet Oral Daily PRN Jacquetta Sharlot GRADE, NP   2 tablet at 02/25/24 1414   traMADol  (ULTRAM ) tablet 50 mg  50 mg Oral Q8H PRN Lord, Jamison Y, NP   50 mg at 02/27/24 2041    Lab Results:  Results for orders placed or performed during the hospital encounter of 02/24/24 (from the past 48 hours)  Urinalysis, Complete w Microscopic -Urine, Clean Catch     Status: Abnormal   Collection Time: 02/26/24  4:30 PM  Result Value Ref Range   Color, Urine STRAW (A) YELLOW   APPearance CLEAR (A) CLEAR   Specific Gravity, Urine 1.009 1.005 - 1.030   pH 5.0 5.0 - 8.0   Glucose, UA NEGATIVE NEGATIVE mg/dL   Hgb urine dipstick NEGATIVE NEGATIVE   Bilirubin Urine NEGATIVE NEGATIVE   Ketones, ur NEGATIVE NEGATIVE mg/dL   Protein, ur NEGATIVE NEGATIVE mg/dL   Nitrite NEGATIVE NEGATIVE   Leukocytes,Ua NEGATIVE NEGATIVE   RBC / HPF 0-5 0 - 5 RBC/hpf   WBC, UA 0-5 0 - 5 WBC/hpf   Bacteria, UA NONE SEEN NONE SEEN   Squamous Epithelial / HPF 0-5 0 - 5 /HPF   Mucus PRESENT     Comment: Performed at [facility], [address] [place], [place] [address]   Comprehensive metabolic panel     Status: Abnormal   Collection Time: 02/26/24  5:29 PM  Result Value Ref Range   Sodium 139 135 - 145 mmol/L   Potassium 4.3 3.5 - 5.1 mmol/L   Chloride 107 98 - 111 mmol/L   CO2 22 22 - 32 mmol/L   Glucose, Bld 122 (H) 70 - 99 mg/dL    Comment: Glucose reference range applies only to samples taken after fasting for at least 8 hours.   BUN 30 (H) 8 - 23 mg/dL   Creatinine, Ser 8.32 (H) 0.44 - 1.00 mg/dL   Calcium  9.7 8.9 - 10.3 mg/dL   Total Protein 7.2 6.5 - 8.1 g/dL   Albumin  3.8 3.5 - 5.0 g/dL   AST 25 15 - 41 U/L   ALT 32 0 - 44 U/L   Alkaline Phosphatase 78 38 - 126 U/L   Total Bilirubin 0.8 0.0 - 1.2 mg/dL   GFR, Estimated 31 (L) >60 mL/min    Comment: (NOTE) Calculated using the CKD-EPI Creatinine Equation (2021)    Anion gap 10 5 - 15    Comment: Performed at [facility], [address] [place], [place] [address]    Blood  Alcohol level:  Lab Results  Component Value Date   [facility] <11 03/11/2014   ETH <11 03/09/2014    Metabolic Disorder Labs: Lab Results  Component Value Date   HGBA1C  03/04/2009    5.1 (NOTE) The ADA recommends the following therapeutic goal for glycemic control related to Hgb A1c measurement: Goal of therapy: <6.5 Hgb A1c  Reference: American Diabetes Association: Clinical Practice Recommendations 2010, Diabetes Care, 2010, 33: (Suppl  1).   MPG 100 03/04/2009   No results found for: PROLACTIN Lab Results  Component Value Date   CHOL 211 (H) 09/14/2009   TRIG 70.0 09/14/2009   HDL 64.80 09/14/2009   CHOLHDL 3 09/14/2009   VLDL 14.0 09/14/2009   LDLCALC (H) 03/04/2009    145        Total Cholesterol/HDL:CHD Risk Coronary Heart Disease Risk Table                     Men   Women  1/2 Average Risk   3.4   3.3  Average Risk       5.0   4.4  2 X Average Risk   9.6   7.1  3 X Average Risk  23.4   11.0        Use the calculated Patient Ratio above and the CHD Risk Table to determine the  patient's CHD Risk.        ATP III CLASSIFICATION (LDL):  <100     mg/dL   Optimal  899-870  mg/dL   Near or Above                    Optimal  130-159  mg/dL   Borderline  839-810  mg/dL   High  >809     mg/dL   Very High    Physical Findings: AIMS:  , ,  ,  ,    CIWA:    COWS:      Psychiatric Specialty Exam:  Presentation  General Appearance:  Casual  Eye Contact: Good  Speech: Clear and Coherent; Normal Rate  Speech Volume: Normal    Mood and Affect  Mood: Euthymic  Affect: Appropriate   Thought Process  Thought Processes: Linear  Descriptions of Associations:Circumstantial  Orientation:Full (Time, Place and Person)  Thought Content:Delusions  Hallucinations:denies  Ideas of Reference:Paranoia; Delusions  Suicidal Thoughts:denies  Homicidal Thoughts:denies   Sensorium  Memory: Remote Poor; Recent Fair; Immediate Fair  Judgment: Impaired  Insight: Fair   Chartered certified accountant: Fair  Attention Span: Fair  Recall: [employer] of Knowledge: Fair  Language: Fair   Psychomotor Activity  Psychomotor Activity: No data recorded  Musculoskeletal: Strength & Muscle Tone: decreased Gait & Station: unsteady Assets  Assets: Manufacturing systems engineer; Housing; Social Support    Physical Exam: Physical Exam Vitals and nursing note reviewed.    ROS Blood pressure 132/69, pulse 94, temperature 97.9 F (36.6 C), resp. rate 17, height 5' 7 (1.702 m), weight 121.6 kg, SpO2 100%. Body mass index is 41.97 kg/m.  Diagnosis: Principal Problem:   Schizoaffective disorder, bipolar type [facility]) Clinical Decision Making: Patient with history of schizoaffective disorder, bipolar type admitted to the inpatient unit for visual hallucinations, paranoid delusions, worsening depressed mood, and anxiety. Patient needs to be monitored closely for safety.    Treatment Plan Summary:   Safety and Monitoring:             --  Involuntary admission to inpatient psychiatric unit  for safety, stabilization and treatment             -- Daily contact with patient to assess and evaluate symptoms and progress in treatment             -- Patient's case to be discussed in multi-disciplinary team meeting             -- Observation Level: q15 minute checks             -- Vital signs:  q12 hours             -- Precautions: suicide, elopement, and assault   2. Psychiatric Diagnoses and Treatment: Schizoaffective disorder, bipolar type.    Continue mirtazapine  45 mg at bedtime Continue Seroquel  XR 150 mg once at bedtime     -patient is noted to be more irritable  Depakote     ER   Increase to 750mg      -- The risks/benefits/side-effects/alternatives to this medication were discussed in detail with the patient and time was given for questions. The patient consents to medication trial.                -- Metabolic profile and EKG monitoring obtained while on an atypical antipsychotic (BMI: Lipid Panel: HbgA1c: QTc:)              -- Encouraged patient to participate in unit milieu and in scheduled group therapies                            3. Medical Issues Being Addressed:   Continue amlodipine  10 mg once daily and metoprolol  50 mg every 8 hours for hypertension   4. Discharge Planning:   -- Social work and case management to assist with discharge planning and identification of hospital follow-up needs prior to discharge  -- Estimated LOS: 3-4 days  Allyn Foil, MD 02/27/2024, 9:19 PM

## 2024-02-27 NOTE — Group Note (Signed)
 [facility] LCSW Group Therapy Note    Group Date: 02/27/2024 Start Time: 1330 End Time: 1400  Type of Therapy and Topic:  Group Therapy:  Overcoming Obstacles  Participation Level:  [facility] PARTICIPATION LEVEL: Did Not Attend  Mood:  Description of Group:   In this group patients will be encouraged to explore what they see as obstacles to their own wellness and recovery. They will be guided to discuss their thoughts, feelings, and behaviors related to these obstacles. The group will process together ways to cope with barriers, with attention given to specific choices patients can make. Each patient will be challenged to identify changes they are motivated to make in order to overcome their obstacles. This group will be process-oriented, with patients participating in exploration of their own experiences as well as giving and receiving support and challenge from other group members.  Therapeutic Goals: 1. Patient will identify personal and current obstacles as they relate to admission. 2. Patient will identify barriers that currently interfere with their wellness or overcoming obstacles.  3. Patient will identify feelings, thought process and behaviors related to these barriers. 4. Patient will identify two changes they are willing to make to overcome these obstacles:    Summary of Patient Progress   X   Therapeutic Modalities:   Cognitive Behavioral Therapy Solution Focused Therapy Motivational Interviewing Relapse Prevention Therapy   [name], LCSWA

## 2024-02-27 NOTE — Group Note (Signed)
 Date:  02/27/2024 Time:  11:21 AM  Group Topic/Focus:  Building Self Esteem:   The Focus of this group is helping patients become aware of the effects of self-esteem on their lives, the things they and others do that enhance or undermine their self-esteem, seeing the relationship between their level of self-esteem and the choices they make and learning ways to enhance self-esteem.    Participation Level:  Did Not Attend  Heidi Young 02/27/2024, 11:21 AM

## 2024-02-27 NOTE — Progress Notes (Signed)
   02/27/24 2000  Psych Admission Type (Psych Patients Only)  Admission Status Involuntary  Psychosocial Assessment  Patient Complaints None  Eye Contact Fair  Facial Expression Animated  Affect Preoccupied  Speech Logical/coherent  Interaction Assertive  Motor Activity Slow  Appearance/Hygiene In scrubs;In hospital gown  Behavior Characteristics Cooperative  Mood Pleasant  Thought Process  Coherency Disorganized  Content WDL  Delusions None reported or observed  Perception WDL  Hallucination None reported or observed  Judgment Limited  Confusion Mild  Danger to Self  Current suicidal ideation? Denies  Agreement Not to Harm Self Yes  Description of Agreement verbal  Danger to Others  Danger to Others None reported or observed   Progress note   D: Pt seen in her room sitting on bed naked. Pt says she is getting dressed so she can go to group.  Pt denies SI, HI, AVH. Pt rates pain  10/10 as generalized pain d/t fibromyalgia. Pt rates anxiety  0/10 and depression  0/10. I feel good today. I had a rash when I came here because of evil but now I'm better. I told my son that I am concerned about people doing evil to me when all I want to do is help.  No other concerns noted at this time.  A: Pt provided support and encouragement. Pt given scheduled medication as prescribed. PRNs as appropriate. Q15 min checks for safety.   R: Pt safe on the unit. Will continue to monitor.

## 2024-02-27 NOTE — Plan of Care (Signed)
   Problem: Education: Goal: Knowledge of [place] Education information/materials will improve Outcome: Progressing Goal: Verbalization of understanding the information provided will improve Outcome: Progressing

## 2024-02-27 NOTE — [facility] Counselor (Signed)
 CSW received message from nursing staff who report that pt's son would like a call from CSW.   CSW contacted Heidi Young.   Heidi reports he is available all day to speak with CSW and provider.   CSW told [place] provider and CSW will contact after 3PM.   CSW informed provider of conversation  Lum Croft, MSW, [facility] 02/27/2024 10:43 AM

## 2024-02-27 NOTE — Progress Notes (Signed)
 Pt told rounding tech that she was in a lot of pain 10/10. Assessed pt and PRN given as appropriate.

## 2024-02-27 NOTE — Progress Notes (Signed)
   02/27/24 1000  Psych Admission Type (Psych Patients Only)  Admission Status Involuntary  Psychosocial Assessment  Patient Complaints Worrying  Eye Contact Fair  Facial Expression Animated  Affect Preoccupied  Speech Logical/coherent  Interaction Assertive  Motor Activity Slow;Tremors  Appearance/Hygiene In hospital gown  Behavior Characteristics Cooperative  Mood Pleasant  Thought Process  Coherency Disorganized  Content WDL  Delusions None reported or observed  Perception WDL  Hallucination None reported or observed  Judgment WDL  Confusion Mild  Danger to Self  Current suicidal ideation? Denies  Agreement Not to Harm Self Yes  Description of Agreement verbal  Danger to Others  Danger to Others None reported or observed

## 2024-02-27 NOTE — Group Note (Signed)
 Date:  02/27/2024 Time:  9:04 PM  Group Topic/Focus:  Wellness Toolbox:   The focus of this group is to discuss various aspects of wellness, balancing those aspects and exploring ways to increase the ability to experience wellness.  Patients will create a wellness toolbox for use upon discharge.    Participation Level:  Active  Participation Quality:  Appropriate  Affect:  Appropriate  Cognitive:  Appropriate  Insight: Appropriate and Good  Engagement in Group:  Engaged  Modes of Intervention:  Discussion  Additional Comments:    Romero Earnie Hope 02/27/2024, 9:04 PM

## 2024-02-27 NOTE — Progress Notes (Signed)
   02/26/24 2100  Psych Admission Type (Psych Patients Only)  Admission Status Involuntary  Psychosocial Assessment  Patient Complaints Depression;Disorientation;Agitation  Eye Contact Fair  Facial Expression Animated  Affect Preoccupied  Speech Logical/coherent  Interaction Assertive  Motor Activity Tremors  Appearance/Hygiene Unremarkable  Behavior Characteristics Cooperative  Mood Preoccupied;Angry  Thought Process  Coherency WDL  Content WDL  Delusions None reported or observed  Perception WDL  Hallucination None reported or observed  Judgment WDL  Confusion WDL  Danger to Self  Current suicidal ideation? Denies  Agreement Not to Harm Self Yes  Description of Agreement verbal  Danger to Others  Danger to Others None reported or observed

## 2024-02-27 NOTE — Group Note (Signed)
 Recreation Therapy Group Note   Group Topic:Leisure Education  Group Date: 02/27/2024 Start Time: 1400 End Time: 1500 Facilitators: Celestia Jeoffrey BRAVO, LRT, CTRS Location: Courtyard  Group Description: Music. Patients encouraged to name their favorite song(s) for LRT to play song through speaker for group to hear, while in the courtyard getting fresh air and sunlight. Patients educated on the definition of leisure and the importance of having different leisure interests outside of the hospital. Group discussed how leisure activities can often be used as Pharmacologist and that listening to music and being outside are examples.    Goal Area(s) Addressed:  Patient will identify a current leisure interest.  Patient will practice making a positive decision. Patient will have the opportunity to try a new leisure activity.   Affect/Mood: Appropriate   Participation Level: Moderate    Clinical Observations/Individualized Feedback: Kathie was present for half of group. Pt came late and left early. Pt was appropriate while present.   Plan: Continue to engage patient in RT group sessions 2-3x/week.   Jeoffrey BRAVO Celestia, LRT, CTRS 02/27/2024 5:00 PM

## 2024-02-28 DIAGNOSIS — F250 Schizoaffective disorder, bipolar type: Secondary | ICD-10-CM | POA: Diagnosis not present

## 2024-02-28 MED ORDER — ARIPIPRAZOLE 5 MG PO TABS
5.0000 mg | ORAL_TABLET | Freq: Every day | ORAL | Status: DC
  Filled 2024-02-28 (×2): qty 1

## 2024-02-28 NOTE — Group Note (Signed)
 Date:  02/28/2024 Time:  8:28 PM  Group Topic/Focus:  Wrap-Up Group:   The focus of this group is to help patients review their daily goal of treatment and discuss progress on daily workbooks.    Participation Level:  Active  Participation Quality:  Appropriate  Affect:  Appropriate  Cognitive:  Appropriate  Insight: Appropriate and Good  Engagement in Group:  Engaged  Modes of Intervention:  Discussion  Additional Comments:    Heidi Young 02/28/2024, 8:28 PM

## 2024-02-28 NOTE — Group Note (Signed)
 Date:  02/28/2024 Time:  10:45 AM  Group Topic/Focus:  Self Care:   The focus of this group is to help patients understand the importance of self-care in order to improve or restore emotional, physical, spiritual, interpersonal, and financial health.    Participation Level:  Did Not Attend  Participation Quality:  Did not attend  Affect:  Did not attend   Cognitive:  Did not attend  Insight: None  Engagement in Group:  Did not attend  Modes of Intervention:  Did not attend  Additional Comments:    Leigh VEAR Pais 02/28/2024, 10:45 AM

## 2024-02-28 NOTE — Group Note (Signed)
 Physical/Occupational Therapy Group Note  Group Topic: Neurographic Art  Group Date: 02/28/2024 Start Time: 1305 End Time: 1350 Facilitators: Clive Warren CROME, OT   Group Description: Group participated with Neurographic art activity, using watercolor paints to facilitate creative expression and meditation/relaxation for each individual.  Incorporated bimanual coordination, mental focus, emotional processing, task/command following and relaxation techniques as appropriate.  Patients engaged socially with therapist and other group participants throughout session. Allowed to ask questions as appropriate, and encouraged to identify ways they could use/share their creations with themselves and others.  Therapeutic Goal(s):  Demonstrate ability to independently manipulate utensils required to participate with and complete activity. Demonstrate ability to cognitively focus on task and follow commands necessary for completion. Demonstrate use of art as an outlet for emotional processing and expression. Identify and demonstrate importance of relaxation, neural calming and meditation for improved participation with life groups.  Individual Participation: Pt did not attend.  Participation Level: Did not attend   Participation Quality:   Behavior:   Speech/Thought Process:   Affect/Mood:   Insight:   Judgement:   Modes of Intervention:   Patient Response to Interventions:    Plan: Continue to engage patient in PT/OT groups 1 - 2x/week.   Prerana Strayer R., MPH, MS, OTR/L ascom [phone] 02/28/24, 3:04 PM

## 2024-02-28 NOTE — Progress Notes (Signed)
   02/28/24 1600  Psych Admission Type (Psych Patients Only)  Admission Status Involuntary  Psychosocial Assessment  Patient Complaints Isolation;Worrying;Irritability  Eye Contact Fair  Facial Expression Animated  Affect Preoccupied  Speech Logical/coherent  Interaction Assertive  Motor Activity Slow;Tremors  Appearance/Hygiene Disheveled;Bizarre  Behavior Characteristics Irritable  Mood Irritable  Thought Process  Coherency Disorganized  Content WDL  Delusions None reported or observed  Perception WDL  Hallucination None reported or observed  Judgment WDL  Confusion Mild  Danger to Self  Current suicidal ideation? Denies  Agreement Not to Harm Self Yes  Description of Agreement verbal  Danger to Others  Danger to Others None reported or observed

## 2024-02-28 NOTE — Progress Notes (Signed)
(  Sleep Hours) - 8 (Any PRNs that were needed, meds refused, or side effects to meds)- tramadol  50 mg 10/10 (Any disturbances and when (visitation, over night)- none (Concerns raised by the patient)- none (SI/HI/AVH)- denies

## 2024-02-28 NOTE — Progress Notes (Signed)
 [facility] MD Progress Note  02/28/2024 11:43 AM Heidi Young  MRN:  [id number] Heidi Young is a 78 y.o. female admitted: Presented to the ED by [facility] 02/18/2024 10:38 PM for IVC. She carries the psychiatric diagnoses of Schizoaffective disorder  with paranoid delusions. She meets criteria for schizophrenia based on a history of fixed false beliefs (e.g., voodoo-related delusions) and ongoing psychotic symptoms that have caused functional impairment over an extended period. Patient is admitted to [facility] unit with Q15 min safety monitoring. Multidisciplinary team approach is offered. Medication management; group/milieu therapy is offered.   Subjective:  Chart reviewed, case discussed in multidisciplinary meeting, patient seen during rounds.  On interview patient is noted to be sitting in the bed naked with the blanket still her chest.  She has different papers of group activities spreadout throughout the bed and the floor.  Provider and the PA student greeted the patient.  Patient looked at the provider and started cussing out saying she is bringing negative energy into the room and demanded for the provider to leave the room.  Provider encouraged patient to talk about her wellness and mental health but patient did not let provider finish her sentence and kept demanding to leave the room.  Patient made paranoid statements about knowing this provider all her life and not wanting to see this provider again.  Provider and social worker called patient's son.  Discussed patient lacking capacity to make medical decisions at this time given the delusions.  Patient's son agreed to try Abilify  for psychosis/delusions.  Sleep: Fair  Appetite:  Poor  Past Psychiatric History: see h&P Family History:  Family History  Problem Relation Age of Onset   Coronary artery disease Mother 84   Heart failure Mother        congestive   Stroke Mother    Brain cancer Father 43   Hypertension Other        siblings    Aortic stenosis Other        sibling   Social History:  Social History   Substance and Sexual Activity  Alcohol Use No   Comment: denies     Social History   Substance and Sexual Activity  Drug Use Yes   Frequency: 1.0 times per week   Types: Marijuana   Comment: due to her chronic pain- last use 03/22/2013    Social History   Socioeconomic History   Marital status: Divorced    Spouse name: Not on file   Number of children: Not on file   Years of education: Not on file   Highest education level: Not on file  Occupational History   Occupation: disability  Tobacco Use   Smoking status: Never    Passive exposure: Never   Smokeless tobacco: Not on file  Vaping Use   Vaping status: Not on file  Substance and Sexual Activity   Alcohol use: No    Comment: denies   Drug use: Yes    Frequency: 1.0 times per week    Types: Marijuana    Comment: due to her chronic pain- last use 03/22/2013   Sexual activity: Never    Birth control/protection: Abstinence  Other Topics Concern   Not on file  Social History Narrative   Not on file   Social Drivers of Health   Financial Resource Strain: Not on file  Food Insecurity: No Food Insecurity (02/24/2024)   Hunger Vital Sign    Worried About Running Out of Food in the Last Year:  Never true    Ran Out of Food in the Last Year: Never true  Recent Concern: Food Insecurity - Food Insecurity Present (02/11/2024)   Hunger Vital Sign    Worried About Running Out of Food in the Last Year: Sometimes true    Ran Out of Food in the Last Year: Sometimes true  Transportation Needs: No Transportation Needs (02/24/2024)   PRAPARE - Administrator, Civil Service (Medical): No    Lack of Transportation (Non-Medical): No  Physical Activity: Not on file  Stress: Not on file  Social Connections: Moderately Isolated (02/24/2024)   Social Connection and Isolation Panel    Frequency of Communication with Friends and Family: More than three  times a week    Frequency of Social Gatherings with Friends and Family: Never    Attends Religious Services: More than 4 times per year    Active Member of [employer] or Organizations: No    Attends Engineer, structural: Never    Marital Status: Divorced   Past Medical History:  Past Medical History:  Diagnosis Date   Asthma    Bipolar 1 disorder (HCC)    Cancer (HCC)    renal cell carcinoma   Cardiomyopathy    Carotid bruit    left   Chest pain    Chronic anemia    DJD (degenerative joint disease)    Fibromyalgia    GERD (gastroesophageal reflux disease)    on rare occas. - uses peptobismol   Heart murmur    History of kidney cancer    unknown type. had R kidney removed b/c of CA.    HLD (hyperlipidemia)    HTN (hypertension)    Kidney stones    Lumbar herniated disc    Obesity    Psychosis (HCC)    Schizophrenia (HCC)    Single kidney    Takotsubo cardiomyopathy    UTI (lower urinary tract infection)     Past Surgical History:  Procedure Laterality Date   BREAST BIOPSY Right 10/24/2022   US  RT BREAST BX W LOC DEV 1ST LESION IMG BX SPEC US  GUIDE 10/24/2022 [facility] MAMMOGRAPHY   BREAST LUMPECTOMY  90's   left, cyst   CARDIAC CATHETERIZATION  2010   no intervention   CESAREAN SECTION  1980   x1   CYSTECTOMY  90's   back (left)   NEPHRECTOMY  1990's   right, secondary to cyst   TOTAL KNEE ARTHROPLASTY  2005   right   TOTAL KNEE ARTHROPLASTY Left 03/25/2013   Procedure: TOTAL KNEE ARTHROPLASTY;  Surgeon: Norleen LITTIE Gavel, MD;  Location: [facility] OR;  Service: Orthopedics;  Laterality: Left;    Current Medications: Current Facility-Administered Medications  Medication Dose Route Frequency Provider Last Rate Last Admin   acetaminophen  (TYLENOL ) tablet 650 mg  650 mg Oral Q6H PRN Jacquetta Sharlot GRADE, NP   650 mg at 02/27/24 2336   albuterol  (PROVENTIL ) (2.5 MG/3ML) 0.083% nebulizer solution 3 mL  3 mL Inhalation 5 X Daily PRN Jacquetta Sharlot GRADE, NP       alum & mag  hydroxide-simeth (MAALOX/MYLANTA) 200-200-20 MG/5ML suspension 30 mL  30 mL Oral Q4H PRN Jacquetta Sharlot GRADE, NP       amLODipine  (NORVASC ) tablet 10 mg  10 mg Oral Daily Lord, Jamison Y, NP   10 mg at 02/28/24 0827   ARIPiprazole  (ABILIFY ) tablet 5 mg  5 mg Oral QHS Ariyon Gerstenberger, MD       divalproex  (DEPAKOTE  ER)  24 hr tablet 500 mg  500 mg Oral Daily Jeremiyah Cullens, MD   500 mg at 02/28/24 9171   fluticasone  furoate-vilanterol (BREO ELLIPTA ) 100-25 MCG/ACT 1 puff  1 puff Inhalation Daily Jacquetta Sharlot GRADE, NP   1 puff at 02/28/24 0827   folic acid  (FOLVITE ) tablet 1 mg  1 mg Oral Daily Jacquetta Sharlot GRADE, NP   1 mg at 02/28/24 9171   gabapentin  (NEURONTIN ) capsule 300 mg  300 mg Oral Q8H Jacquetta Sharlot GRADE, NP   300 mg at 02/28/24 9356   metoprolol  tartrate (LOPRESSOR ) tablet 50 mg  50 mg Oral Q8H Lord, Jamison Y, NP   50 mg at 02/27/24 2107   mirabegron  ER (MYRBETRIQ ) tablet 25 mg  25 mg Oral Daily PRN Jacquetta Sharlot GRADE, NP       mirtazapine  (REMERON ) tablet 45 mg  45 mg Oral QHS Jacquetta Sharlot GRADE, NP   45 mg at 02/27/24 2107   OLANZapine  (ZYPREXA ) tablet 5 mg  5 mg Oral BID PRN Jacquetta Sharlot GRADE, NP       Or   OLANZapine  (ZYPREXA ) injection 5 mg  5 mg Intramuscular BID PRN Jacquetta Sharlot GRADE, NP       pantoprazole  (PROTONIX ) EC tablet 40 mg  40 mg Oral Daily Jacquetta Sharlot GRADE, NP   40 mg at 02/28/24 0827   polyethylene glycol (MIRALAX  / GLYCOLAX ) packet 17 g  17 g Oral Daily PRN Jacquetta Sharlot GRADE, NP       senna-docusate (Senokot-S) tablet 2 tablet  2 tablet Oral Daily PRN Jacquetta Sharlot GRADE, NP   2 tablet at 02/25/24 1414   traMADol  (ULTRAM ) tablet 50 mg  50 mg Oral Q8H PRN Lord, Jamison Y, NP   50 mg at 02/27/24 2041    Lab Results:  Results for orders placed or performed during the hospital encounter of 02/24/24 (from the past 48 hours)  Urinalysis, Complete w Microscopic -Urine, Clean Catch     Status: Abnormal   Collection Time: 02/26/24  4:30 PM  Result Value Ref Range   Color, Urine STRAW (A) YELLOW    APPearance CLEAR (A) CLEAR   Specific Gravity, Urine 1.009 1.005 - 1.030   pH 5.0 5.0 - 8.0   Glucose, UA NEGATIVE NEGATIVE mg/dL   Hgb urine dipstick NEGATIVE NEGATIVE   Bilirubin Urine NEGATIVE NEGATIVE   Ketones, ur NEGATIVE NEGATIVE mg/dL   Protein, ur NEGATIVE NEGATIVE mg/dL   Nitrite NEGATIVE NEGATIVE   Leukocytes,Ua NEGATIVE NEGATIVE   RBC / HPF 0-5 0 - 5 RBC/hpf   WBC, UA 0-5 0 - 5 WBC/hpf   Bacteria, UA NONE SEEN NONE SEEN   Squamous Epithelial / HPF 0-5 0 - 5 /HPF   Mucus PRESENT     Comment: Performed at [facility], [address] [place], [place] [address]  Comprehensive metabolic panel     Status: Abnormal   Collection Time: 02/26/24  5:29 PM  Result Value Ref Range   Sodium 139 135 - 145 mmol/L   Potassium 4.3 3.5 - 5.1 mmol/L   Chloride 107 98 - 111 mmol/L   CO2 22 22 - 32 mmol/L   Glucose, Bld 122 (H) 70 - 99 mg/dL    Comment: Glucose reference range applies only to samples taken after fasting for at least 8 hours.   BUN 30 (H) 8 - 23 mg/dL   Creatinine, Ser 8.32 (H) 0.44 - 1.00 mg/dL   Calcium  9.7 8.9 - 10.3 mg/dL   Total  Protein 7.2 6.5 - 8.1 g/dL   Albumin  3.8 3.5 - 5.0 g/dL   AST 25 15 - 41 U/L   ALT 32 0 - 44 U/L   Alkaline Phosphatase 78 38 - 126 U/L   Total Bilirubin 0.8 0.0 - 1.2 mg/dL   GFR, Estimated 31 (L) >60 mL/min    Comment: (NOTE) Calculated using the CKD-EPI Creatinine Equation (2021)    Anion gap 10 5 - 15    Comment: Performed at [facility], [address] [place], [place] [address]    Blood Alcohol level:  Lab Results  Component Value Date   [facility] <11 03/11/2014   ETH <11 03/09/2014    Metabolic Disorder Labs: Lab Results  Component Value Date   HGBA1C  03/04/2009    5.1 (NOTE) The ADA recommends the following therapeutic goal for glycemic control related to Hgb A1c measurement: Goal of therapy: <6.5 Hgb A1c  Reference: American Diabetes Association: Clinical Practice Recommendations 2010, Diabetes Care,  2010, 33: (Suppl  1).   MPG 100 03/04/2009   No results found for: PROLACTIN Lab Results  Component Value Date   CHOL 211 (H) 09/14/2009   TRIG 70.0 09/14/2009   HDL 64.80 09/14/2009   CHOLHDL 3 09/14/2009   VLDL 14.0 09/14/2009   LDLCALC (H) 03/04/2009    145        Total Cholesterol/HDL:CHD Risk Coronary Heart Disease Risk Table                     Men   Women  1/2 Average Risk   3.4   3.3  Average Risk       5.0   4.4  2 X Average Risk   9.6   7.1  3 X Average Risk  23.4   11.0        Use the calculated Patient Ratio above and the CHD Risk Table to determine the patient's CHD Risk.        ATP III CLASSIFICATION (LDL):  <100     mg/dL   Optimal  899-870  mg/dL   Near or Above                    Optimal  130-159  mg/dL   Borderline  839-810  mg/dL   High  >809     mg/dL   Very High    Physical Findings: AIMS:  , ,  ,  ,    CIWA:    COWS:      Psychiatric Specialty Exam:  Presentation  General Appearance:  Casual  Eye Contact: Good  Speech: Clear and Coherent; Normal Rate  Speech Volume: Normal    Mood and Affect  Mood: Euthymic  Affect: Appropriate   Thought Process  Thought Processes: Linear  Descriptions of Associations:Circumstantial  Orientation:Full (Time, Place and Person)  Thought Content:Delusions  Hallucinations:denies  Ideas of Reference:Paranoia; Delusions  Suicidal Thoughts:denies  Homicidal Thoughts:denies   Sensorium  Memory: Remote Poor; Recent Fair; Immediate Fair  Judgment: Impaired  Insight: Fair   Chartered certified accountant: Fair  Attention Span: Fair  Recall: [employer] of Knowledge: Fair  Language: Fair   Psychomotor Activity  Psychomotor Activity: No data recorded  Musculoskeletal: Strength & Muscle Tone: decreased Gait & Station: unsteady Assets  Assets: Manufacturing systems engineer; Housing; Social Support    Physical Exam: Physical Exam Vitals and nursing note  reviewed.    ROS Blood pressure (!) 121/53, pulse 75, temperature  97.7 F (36.5 C), resp. rate 18, height 5' 7 (1.702 m), weight 121.6 kg, SpO2 100%. Body mass index is 41.97 kg/m.  Diagnosis: Principal Problem:   Schizoaffective disorder, bipolar type [facility]) Clinical Decision Making: Patient with history of schizoaffective disorder, bipolar type admitted to the inpatient unit for visual hallucinations, paranoid delusions, worsening depressed mood, and anxiety. Patient needs to be monitored closely for safety.    Treatment Plan Summary:   Safety and Monitoring:             -- Involuntary admission to inpatient psychiatric unit for safety, stabilization and treatment             -- Daily contact with patient to assess and evaluate symptoms and progress in treatment             -- Patient's case to be discussed in multi-disciplinary team meeting             -- Observation Level: q15 minute checks             -- Vital signs:  q12 hours             -- Precautions: suicide, elopement, and assault   2. Psychiatric Diagnoses and Treatment: Schizoaffective disorder, bipolar type.   bilify 5 mg daily Continue mirtazapine  45 mg at bedtime DisContinue Seroquel  XR 150 mg once at bedtime     -patient is noted to be more irritable  Depakote     ER   Increase to 750mg      -- The risks/benefits/side-effects/alternatives to this medication were discussed in detail with the patient and time was given for questions. The patient consents to medication trial.                -- Metabolic profile and EKG monitoring obtained while on an atypical antipsychotic (BMI: Lipid Panel: HbgA1c: QTc:)              -- Encouraged patient to participate in unit milieu and in scheduled group therapies                            3. Medical Issues Being Addressed:   Continue amlodipine  10 mg once daily and metoprolol  50 mg every 8 hours for hypertension   4. Discharge Planning:   -- Social work and case management to  assist with discharge planning and identification of hospital follow-up needs prior to discharge  -- Estimated LOS: 3-4 days  Jahsiah Carpenter, MD 02/28/2024, 11:43 AM

## 2024-02-28 NOTE — Plan of Care (Signed)
   Problem: Physical Regulation: Goal: Ability to maintain clinical measurements within normal limits will improve Outcome: Progressing   Problem: Safety: Goal: Periods of time without injury will increase Outcome: Progressing

## 2024-02-28 NOTE — Group Note (Signed)
 Recreation Therapy Group Note   Group Topic:Communication  Group Date: 02/28/2024 Start Time: 1510 End Time: 1600 Facilitators: Celestia Jeoffrey BRAVO, LRT, CTRS Location: Courtyard  Group Description: Emotional Check in. Patient sat and talked with LRT about how they are doing and whatever else is on their mind. LRT provided active listening, reassurance and encouragement. Pts were given the opportunity to listen to music or color mandalas while they talk.    Goal Area(s) Addressed: Patient will engage in conversation with LRT. Patient will communicate their wants, needs, or questions.  Patient will practice a new coping skill of "talking to someone".   Affect/Mood: N/A   Participation Level: Did not attend    Clinical Observations/Individualized Feedback: Patient did not attend group.   Plan: Continue to engage patient in RT group sessions 2-3x/week.   Jeoffrey BRAVO Celestia, LRT, CTRS 02/28/2024 4:29 PM

## 2024-02-28 NOTE — [facility] Counselor (Signed)
 CSW contacted pt's son Gaylen Pereyra, [phone] with provider per provider's request.   Provider gave updates regarding increasing pt's medication as at  this time provider reports pt is unable to make decisions about her medication.   Pt's son agreeable to treatment plan.   Lum Croft, MSW, [facility] 02/28/2024 10:58 AM

## 2024-02-28 NOTE — Plan of Care (Signed)
  Problem: Education: Goal: Emotional status will improve Outcome: Progressing Goal: Mental status will improve Outcome: Progressing   Problem: Activity: Goal: Interest or engagement in activities will improve Outcome: Progressing Goal: Sleeping patterns will improve Outcome: Progressing   Problem: Coping: Goal: Ability to verbalize frustrations and anger appropriately will improve Outcome: Progressing

## 2024-02-29 DIAGNOSIS — F250 Schizoaffective disorder, bipolar type: Secondary | ICD-10-CM | POA: Diagnosis not present

## 2024-02-29 MED ORDER — DIVALPROEX SODIUM ER 500 MG PO TB24
750.0000 mg | ORAL_TABLET | Freq: Every day | ORAL | Status: DC
  Administered 2024-03-01 – 2024-03-05 (×5): 750 mg via ORAL
  Filled 2024-02-29 (×5): qty 1

## 2024-02-29 MED ORDER — ONDANSETRON 4 MG PO TBDP
4.0000 mg | ORAL_TABLET | Freq: Three times a day (TID) | ORAL | Status: DC | PRN
  Administered 2024-02-29: 4 mg via ORAL
  Filled 2024-02-29: qty 1

## 2024-02-29 MED ORDER — ARIPIPRAZOLE 5 MG PO TABS
5.0000 mg | ORAL_TABLET | Freq: Two times a day (BID) | ORAL | Status: DC
  Administered 2024-02-29 – 2024-03-05 (×11): 5 mg via ORAL
  Filled 2024-02-29 (×11): qty 1

## 2024-02-29 NOTE — Progress Notes (Signed)
   02/29/24 2200  Psych Admission Type (Psych Patients Only)  Admission Status Involuntary  Psychosocial Assessment  Patient Complaints None  Eye Contact Fair  Facial Expression Animated  Affect Preoccupied  Speech Logical/coherent  Interaction Assertive  Motor Activity Slow;Tremors  Appearance/Hygiene Disheveled  Behavior Characteristics Cooperative  Mood Pleasant;Preoccupied  Thought Process  Coherency Disorganized  Content WDL  Delusions None reported or observed  Perception WDL  Hallucination None reported or observed  Judgment WDL  Confusion Mild  Danger to Self  Current suicidal ideation? Denies  Agreement Not to Harm Self Yes  Description of Agreement verbal  Danger to Others  Danger to Others None reported or observed

## 2024-02-29 NOTE — Progress Notes (Signed)
   02/29/24 1400  Psych Admission Type (Psych Patients Only)  Admission Status Involuntary  Psychosocial Assessment  Patient Complaints None  Eye Contact Fair  Facial Expression Animated  Affect Preoccupied  Speech Logical/coherent  Interaction Assertive  Motor Activity Slow;Tremors  Appearance/Hygiene Disheveled;Bizarre  Behavior Characteristics Cooperative  Mood Pleasant  Thought Process  Coherency Disorganized  Content WDL  Delusions None reported or observed  Perception WDL  Hallucination None reported or observed  Judgment WDL  Confusion Mild  Danger to Self  Current suicidal ideation? Denies  Agreement Not to Harm Self Yes  Description of Agreement verbal  Danger to Others  Danger to Others None reported or observed

## 2024-02-29 NOTE — Group Note (Signed)
 Date:  02/29/2024 Time:  8:43 PM  Group Topic/Focus:  Making Healthy Choices:   The focus of this group is to help patients identify negative/unhealthy choices they were using prior to admission and identify positive/healthier coping strategies to replace them upon discharge.  MHT made introductions. MHT discussed expectations on the unit. MHT informed patients that techs would be checking in throughout the night and not to be alarmed if they see someone looking in. MHT asked patients about concerns on the unit. MHT discussed aftercare plans and informed of resources in the community. MHT encouraged continued treatment, informing of [facility]'s mental health services as well as calling their respective insurance companies for healthcare providers in their network. MHT provided supportive counseling to address questions on how to get linked with mental health services.  Participation Level:  Did Not Attend  Participation Quality:  Did not attend  Affect:  Did not attend  Cognitive:  Did not attend  Insight: None  Engagement in Group:  Did not attend  Modes of Intervention:  Did not attend  Additional Comments:    Heidi Young 02/29/2024, 8:43 PM

## 2024-02-29 NOTE — Plan of Care (Signed)
   Problem: Education: Goal: Emotional status will improve Outcome: Progressing Goal: Mental status will improve Outcome: Progressing

## 2024-02-29 NOTE — Progress Notes (Signed)
 [facility] MD Progress Note  02/29/2024 1:30 PM Heidi Young  MRN:  [id number] Heidi Young is a 78 y.o. female admitted: Presented to the ED by [facility] 02/18/2024 10:38 PM for IVC. She carries the psychiatric diagnoses of Schizoaffective disorder  with paranoid delusions. She meets criteria for schizophrenia based on a history of fixed false beliefs (e.g., voodoo-related delusions) and ongoing psychotic symptoms that have caused functional impairment over an extended period. Patient is admitted to [facility] unit with Q15 min safety monitoring. Multidisciplinary team approach is offered. Medication management; group/milieu therapy is offered.   Subjective:  Chart reviewed, case discussed in multidisciplinary meeting, patient seen during rounds.  Today on interview patient is noted to be sitting in the day area.  Other patients complain that she had no pants on but she agreed to wrap up the blanket around her legs.  She is noted to be calmer.  She is able to talk to the nurse along with the provider.  She talked about having experiences of visual hallucinations when she was home and she also talked about having difficulty with emotions a day ago.  Today she is denying SI/HI/plan and denies hallucinations.  Per nursing patient took her medications today morning including Abilify  and Depakote .  Provider and social worker called patient's son.  Discussed patient lacking capacity to make medical decisions at this time given the delusions.  Patient's son agreed to try Abilify  for psychosis/delusions.  Sleep: Fair  Appetite:  Poor  Past Psychiatric History: see h&P Family History:  Family History  Problem Relation Age of Onset   Coronary artery disease Mother 77   Heart failure Mother        congestive   Stroke Mother    Brain cancer Father 64   Hypertension Other        siblings   Aortic stenosis Other        sibling   Social History:  Social History   Substance and Sexual Activity  Alcohol  Use No   Comment: denies     Social History   Substance and Sexual Activity  Drug Use Yes   Frequency: 1.0 times per week   Types: Marijuana   Comment: due to her chronic pain- last use 03/22/2013    Social History   Socioeconomic History   Marital status: Divorced    Spouse name: Not on file   Number of children: Not on file   Years of education: Not on file   Highest education level: Not on file  Occupational History   Occupation: disability  Tobacco Use   Smoking status: Never    Passive exposure: Never   Smokeless tobacco: Not on file  Vaping Use   Vaping status: Not on file  Substance and Sexual Activity   Alcohol use: No    Comment: denies   Drug use: Yes    Frequency: 1.0 times per week    Types: Marijuana    Comment: due to her chronic pain- last use 03/22/2013   Sexual activity: Never    Birth control/protection: Abstinence  Other Topics Concern   Not on file  Social History Narrative   Not on file   Social Drivers of Health   Financial Resource Strain: Not on file  Food Insecurity: No Food Insecurity (02/24/2024)   Hunger Vital Sign    Worried About Running Out of Food in the Last Year: Never true    Ran Out of Food in the Last Year: Never true  Recent  Concern: Food Insecurity - Food Insecurity Present (02/11/2024)   Hunger Vital Sign    Worried About Running Out of Food in the Last Year: Sometimes true    Ran Out of Food in the Last Year: Sometimes true  Transportation Needs: No Transportation Needs (02/24/2024)   PRAPARE - Administrator, Civil Service (Medical): No    Lack of Transportation (Non-Medical): No  Physical Activity: Not on file  Stress: Not on file  Social Connections: Moderately Isolated (02/24/2024)   Social Connection and Isolation Panel    Frequency of Communication with Friends and Family: More than three times a week    Frequency of Social Gatherings with Friends and Family: Never    Attends Religious Services: More than 4  times per year    Active Member of [employer] or Organizations: No    Attends Engineer, structural: Never    Marital Status: Divorced   Past Medical History:  Past Medical History:  Diagnosis Date   Asthma    Bipolar 1 disorder (HCC)    Cancer (HCC)    renal cell carcinoma   Cardiomyopathy    Carotid bruit    left   Chest pain    Chronic anemia    DJD (degenerative joint disease)    Fibromyalgia    GERD (gastroesophageal reflux disease)    on rare occas. - uses peptobismol   Heart murmur    History of kidney cancer    unknown type. had R kidney removed b/c of CA.    HLD (hyperlipidemia)    HTN (hypertension)    Kidney stones    Lumbar herniated disc    Obesity    Psychosis (HCC)    Schizophrenia (HCC)    Single kidney    Takotsubo cardiomyopathy    UTI (lower urinary tract infection)     Past Surgical History:  Procedure Laterality Date   BREAST BIOPSY Right 10/24/2022   US  RT BREAST BX W LOC DEV 1ST LESION IMG BX SPEC US  GUIDE 10/24/2022 [facility] MAMMOGRAPHY   BREAST LUMPECTOMY  90's   left, cyst   CARDIAC CATHETERIZATION  2010   no intervention   CESAREAN SECTION  1980   x1   CYSTECTOMY  90's   back (left)   NEPHRECTOMY  1990's   right, secondary to cyst   TOTAL KNEE ARTHROPLASTY  2005   right   TOTAL KNEE ARTHROPLASTY Left 03/25/2013   Procedure: TOTAL KNEE ARTHROPLASTY;  Surgeon: Norleen LITTIE Gavel, MD;  Location: [facility] OR;  Service: Orthopedics;  Laterality: Left;    Current Medications: Current Facility-Administered Medications  Medication Dose Route Frequency Provider Last Rate Last Admin   acetaminophen  (TYLENOL ) tablet 650 mg  650 mg Oral Q6H PRN Jacquetta Sharlot GRADE, NP   650 mg at 02/27/24 2336   albuterol  (PROVENTIL ) (2.5 MG/3ML) 0.083% nebulizer solution 3 mL  3 mL Inhalation 5 X Daily PRN Jacquetta Sharlot GRADE, NP       alum & mag hydroxide-simeth (MAALOX/MYLANTA) 200-200-20 MG/5ML suspension 30 mL  30 mL Oral Q4H PRN Jacquetta Sharlot GRADE, NP       amLODipine  (NORVASC )  tablet 10 mg  10 mg Oral Daily Lord, Jamison Y, NP   10 mg at 02/29/24 9071   ARIPiprazole  (ABILIFY ) tablet 5 mg  5 mg Oral BID Heidi Galla, MD   5 mg at 02/29/24 1202   [START ON 03/01/2024] divalproex  (DEPAKOTE  ER) 24 hr tablet 750 mg  750 mg Oral Daily Anayiah Howden,  Shenandoah Yeats, MD       fluticasone  furoate-vilanterol (BREO ELLIPTA ) 100-25 MCG/ACT 1 puff  1 puff Inhalation Daily Jacquetta Sharlot GRADE, NP   1 puff at 02/29/24 9071   folic acid  (FOLVITE ) tablet 1 mg  1 mg Oral Daily Jacquetta Sharlot GRADE, NP   1 mg at 02/29/24 9071   gabapentin  (NEURONTIN ) capsule 300 mg  300 mg Oral Q8H Jacquetta Sharlot GRADE, NP   300 mg at 02/28/24 1419   metoprolol  tartrate (LOPRESSOR ) tablet 50 mg  50 mg Oral Q8H Lord, Jamison Y, NP   50 mg at 02/28/24 1419   mirabegron  ER (MYRBETRIQ ) tablet 25 mg  25 mg Oral Daily PRN Lord, Jamison Y, NP       mirtazapine  (REMERON ) tablet 45 mg  45 mg Oral QHS Lord, Jamison Y, NP   45 mg at 02/27/24 2107   OLANZapine  (ZYPREXA ) tablet 5 mg  5 mg Oral BID PRN Jacquetta Sharlot GRADE, NP       Or   OLANZapine  (ZYPREXA ) injection 5 mg  5 mg Intramuscular BID PRN Lord, Jamison Y, NP       pantoprazole  (PROTONIX ) EC tablet 40 mg  40 mg Oral Daily Lord, Jamison Y, NP   40 mg at 02/29/24 9071   polyethylene glycol (MIRALAX  / GLYCOLAX ) packet 17 g  17 g Oral Daily PRN Lord, Jamison Y, NP   17 g at 02/28/24 1415   senna-docusate (Senokot-S) tablet 2 tablet  2 tablet Oral Daily PRN Lord, Jamison Y, NP   2 tablet at 02/25/24 1414   traMADol  (ULTRAM ) tablet 50 mg  50 mg Oral Q8H PRN Lord, Jamison Y, NP   50 mg at 02/27/24 2041    Lab Results:  No results found for this or any previous visit (from the past 48 hours).   Blood Alcohol level:  Lab Results  Component Value Date   [facility] <11 03/11/2014   ETH <11 03/09/2014    Metabolic Disorder Labs: Lab Results  Component Value Date   HGBA1C  03/04/2009    5.1 (NOTE) The ADA recommends the following therapeutic goal for glycemic control related to Hgb A1c  measurement: Goal of therapy: <6.5 Hgb A1c  Reference: American Diabetes Association: Clinical Practice Recommendations 2010, Diabetes Care, 2010, 33: (Suppl  1).   MPG 100 03/04/2009   No results found for: PROLACTIN Lab Results  Component Value Date   CHOL 211 (H) 09/14/2009   TRIG 70.0 09/14/2009   HDL 64.80 09/14/2009   CHOLHDL 3 09/14/2009   VLDL 14.0 09/14/2009   LDLCALC (H) 03/04/2009    145        Total Cholesterol/HDL:CHD Risk Coronary Heart Disease Risk Table                     Men   Women  1/2 Average Risk   3.4   3.3  Average Risk       5.0   4.4  2 X Average Risk   9.6   7.1  3 X Average Risk  23.4   11.0        Use the calculated Patient Ratio above and the CHD Risk Table to determine the patient's CHD Risk.        ATP III CLASSIFICATION (LDL):  <100     mg/dL   Optimal  899-870  mg/dL   Near or Above  Optimal  130-159  mg/dL   Borderline  839-810  mg/dL   High  >809     mg/dL   Very High    Physical Findings: AIMS:  , ,  ,  ,    CIWA:    COWS:      Psychiatric Specialty Exam:  Presentation  General Appearance:  Casual  Eye Contact: Good  Speech: Clear and Coherent; Normal Rate  Speech Volume: Normal    Mood and Affect  Mood: Euthymic  Affect: Appropriate   Thought Process  Thought Processes: Linear  Descriptions of Associations:Circumstantial  Orientation:Full (Time, Place and Person)  Thought Content:Delusions  Hallucinations:denies  Ideas of Reference:Paranoia; Delusions  Suicidal Thoughts:denies  Homicidal Thoughts:denies   Sensorium  Memory: Remote Poor; Recent Fair; Immediate Fair  Judgment: Impaired  Insight: Fair   Chartered certified accountant: Fair  Attention Span: Fair  Recall: [employer] of Knowledge: Fair  Language: Fair   Psychomotor Activity  Psychomotor Activity: No data recorded  Musculoskeletal: Strength & Muscle Tone: decreased Gait &  Station: unsteady Assets  Assets: Manufacturing systems engineer; Housing; Social Support    Physical Exam: Physical Exam Vitals and nursing note reviewed.    ROS Blood pressure (!) 148/73, pulse (!) 105, temperature 97.6 F (36.4 C), resp. rate 18, height 5' 7 (1.702 m), weight 121.6 kg, SpO2 96%. Body mass index is 41.97 kg/m.  Diagnosis: Principal Problem:   Schizoaffective disorder, bipolar type [facility]) Clinical Decision Making: Patient with history of schizoaffective disorder, bipolar type admitted to the inpatient unit for visual hallucinations, paranoid delusions, worsening depressed mood, and anxiety. Patient needs to be monitored closely for safety.    Treatment Plan Summary:   Safety and Monitoring:             -- Involuntary admission to inpatient psychiatric unit for safety, stabilization and treatment             -- Daily contact with patient to assess and evaluate symptoms and progress in treatment             -- Patient's case to be discussed in multi-disciplinary team meeting             -- Observation Level: q15 minute checks             -- Vital signs:  q12 hours             -- Precautions: suicide, elopement, and assault   2. Psychiatric Diagnoses and Treatment: Schizoaffective disorder, bipolar type.   Abilify  5 mg BID Continue mirtazapine  45 mg at bedtime DisContinue Seroquel  XR 150 mg once at bedtime     -patient is noted to be more irritable  Depakote     ER   Increase to 750mg      -- The risks/benefits/side-effects/alternatives to this medication were discussed in detail with the patient and time was given for questions. The patient consents to medication trial.                -- Metabolic profile and EKG monitoring obtained while on an atypical antipsychotic (BMI: Lipid Panel: HbgA1c: QTc:)              -- Encouraged patient to participate in unit milieu and in scheduled group therapies                            3. Medical Issues Being Addressed:   Continue  amlodipine  10 mg  once daily and metoprolol  50 mg every 8 hours for hypertension   4. Discharge Planning:   -- Social work and case management to assist with discharge planning and identification of hospital follow-up needs prior to discharge  -- Estimated LOS: 3-4 days  Luevenia Mcavoy, MD 02/29/2024, 1:30 PM

## 2024-02-29 NOTE — Group Note (Signed)
 Date:  02/29/2024 Time:  4:30 PM  Group Topic/Focus:  Outside Rec/Activities The purpose of this group is for patients to go outside and get fresh air while doing some outside activities with peers.    Participation Level:  Did Not Attend  Heidi Young 02/29/2024, 4:30 PM

## 2024-02-29 NOTE — Plan of Care (Signed)

## 2024-02-29 NOTE — Group Note (Signed)
 Date:  02/29/2024 Time:  6:31 PM  Group Topic/Focus:  Coping With Mental Health Crisis:   The purpose of this group is to help patients identify strategies for coping with mental health crisis.  Group discusses possible causes of crisis and ways to manage them effectively. Self Care:   The focus of this group is to help patients understand the importance of self-care in order to improve or restore emotional, physical, spiritual, interpersonal, and financial health. Wellness Toolbox:   The focus of this group is to discuss various aspects of wellness, balancing those aspects and exploring ways to increase the ability to experience wellness.  Patients will create a wellness toolbox for use upon discharge.    Participation Level:  Active  Participation Quality:  Appropriate  Affect:  Appropriate  Cognitive:  Appropriate  Insight: Appropriate  Engagement in Group:  Engaged  Modes of Intervention:  Activity and Discussion  Additional Comments:    Heidi Young L Heidi Young 02/29/2024, 6:31 PM

## 2024-02-29 NOTE — Plan of Care (Signed)
  Problem: Education: Goal: Mental status will improve Outcome: Not Progressing   Problem: Activity: Goal: Sleeping patterns will improve Outcome: Not Progressing   Problem: Coping: Goal: Ability to demonstrate self-control will improve Outcome: Not Progressing   Problem: Health Behavior/Discharge Planning: Goal: Compliance with treatment plan for underlying cause of condition will improve Outcome: Not Progressing   Problem: Safety: Goal: Periods of time without injury will increase Outcome: Progressing

## 2024-03-01 DIAGNOSIS — F250 Schizoaffective disorder, bipolar type: Secondary | ICD-10-CM | POA: Diagnosis not present

## 2024-03-01 NOTE — Group Note (Signed)
 Date:  03/01/2024 Time:  10:25 PM  Group Topic/Focus:  Wrap-Up Group:   The focus of this group is to help patients review their daily goal of treatment and discuss progress on daily workbooks.    Participation Level:  Active  Participation Quality:  Appropriate  Affect:  Appropriate  Cognitive:  Appropriate  Insight: Appropriate  Engagement in Group:  Engaged  Modes of Intervention:  Discussion  Additional Comments:    Heidi Young 03/01/2024, 10:25 PM

## 2024-03-01 NOTE — Group Note (Signed)
 Date:  03/01/2024 Time:  11:06 AM  Group Topic/Focus:  Goals/Bingo Group:   The focus of this group is to help patients establish daily goals to achieve during treatment and discuss how the patient can incorporate goal setting into their daily lives to aide in recovery. Pts also played bingo and won small prizes     Participation Level:  Did Not Attend  Heidi Young 03/01/2024, 11:06 AM

## 2024-03-01 NOTE — Progress Notes (Signed)
 [facility] MD Progress Note  03/01/2024 1:35 PM Heidi Young  MRN:  [id number] Heidi Young is a 78 y.o. female admitted: Presented to the ED by [facility] 02/18/2024 10:38 PM for IVC. She carries the psychiatric diagnoses of Schizoaffective disorder  with paranoid delusions. She meets criteria for schizophrenia based on a history of fixed false beliefs (e.g., voodoo-related delusions) and ongoing psychotic symptoms that have caused functional impairment over an extended period. Patient is admitted to [facility] unit with Q15 min safety monitoring. Multidisciplinary team approach is offered. Medication management; group/milieu therapy is offered.   Subjective:  Chart reviewed, case discussed in multidisciplinary meeting, patient seen during rounds.  On interview patient is noted to be sitting in her room.  She was calm and cooperative.  She is able to acknowledge the previous episodes of agitation and reports that she feels calm today.  She reports that the medications are working and denies having any side effects.  She denies SI/HI/plan and denies hallucinations.  Per nursing report patient is displaying, safe behaviors  Provider and social worker called patient's son.  Discussed patient lacking capacity to make medical decisions at this time given the delusions.  Patient's son agreed to try Abilify  for psychosis/delusions.  Sleep: Fair  Appetite:  Poor  Past Psychiatric History: see h&P Family History:  Family History  Problem Relation Age of Onset   Coronary artery disease Mother 75   Heart failure Mother        congestive   Stroke Mother    Brain cancer Father 11   Hypertension Other        siblings   Aortic stenosis Other        sibling   Social History:  Social History   Substance and Sexual Activity  Alcohol Use No   Comment: denies     Social History   Substance and Sexual Activity  Drug Use Yes   Frequency: 1.0 times per week   Types: Marijuana   Comment: due to her  chronic pain- last use 03/22/2013    Social History   Socioeconomic History   Marital status: Divorced    Spouse name: Not on file   Number of children: Not on file   Years of education: Not on file   Highest education level: Not on file  Occupational History   Occupation: disability  Tobacco Use   Smoking status: Never    Passive exposure: Never   Smokeless tobacco: Not on file  Vaping Use   Vaping status: Not on file  Substance and Sexual Activity   Alcohol use: No    Comment: denies   Drug use: Yes    Frequency: 1.0 times per week    Types: Marijuana    Comment: due to her chronic pain- last use 03/22/2013   Sexual activity: Never    Birth control/protection: Abstinence  Other Topics Concern   Not on file  Social History Narrative   Not on file   Social Drivers of Health   Financial Resource Strain: Not on file  Food Insecurity: No Food Insecurity (02/24/2024)   Hunger Vital Sign    Worried About Running Out of Food in the Last Year: Never true    Ran Out of Food in the Last Year: Never true  Recent Concern: Food Insecurity - Food Insecurity Present (02/11/2024)   Hunger Vital Sign    Worried About Running Out of Food in the Last Year: Sometimes true    Ran Out of Food in  the Last Year: Sometimes true  Transportation Needs: No Transportation Needs (02/24/2024)   PRAPARE - Administrator, Civil Service (Medical): No    Lack of Transportation (Non-Medical): No  Physical Activity: Not on file  Stress: Not on file  Social Connections: Moderately Isolated (02/24/2024)   Social Connection and Isolation Panel    Frequency of Communication with Friends and Family: More than three times a week    Frequency of Social Gatherings with Friends and Family: Never    Attends Religious Services: More than 4 times per year    Active Member of [employer] or Organizations: No    Attends Engineer, structural: Never    Marital Status: Divorced   Past Medical History:   Past Medical History:  Diagnosis Date   Asthma    Bipolar 1 disorder (HCC)    Cancer (HCC)    renal cell carcinoma   Cardiomyopathy    Carotid bruit    left   Chest pain    Chronic anemia    DJD (degenerative joint disease)    Fibromyalgia    GERD (gastroesophageal reflux disease)    on rare occas. - uses peptobismol   Heart murmur    History of kidney cancer    unknown type. had R kidney removed b/c of CA.    HLD (hyperlipidemia)    HTN (hypertension)    Kidney stones    Lumbar herniated disc    Obesity    Psychosis (HCC)    Schizophrenia (HCC)    Single kidney    Takotsubo cardiomyopathy    UTI (lower urinary tract infection)     Past Surgical History:  Procedure Laterality Date   BREAST BIOPSY Right 10/24/2022   US  RT BREAST BX W LOC DEV 1ST LESION IMG BX SPEC US  GUIDE 10/24/2022 [facility] MAMMOGRAPHY   BREAST LUMPECTOMY  90's   left, cyst   CARDIAC CATHETERIZATION  2010   no intervention   CESAREAN SECTION  1980   x1   CYSTECTOMY  90's   back (left)   NEPHRECTOMY  1990's   right, secondary to cyst   TOTAL KNEE ARTHROPLASTY  2005   right   TOTAL KNEE ARTHROPLASTY Left 03/25/2013   Procedure: TOTAL KNEE ARTHROPLASTY;  Surgeon: Norleen LITTIE Gavel, MD;  Location: [facility] OR;  Service: Orthopedics;  Laterality: Left;    Current Medications: Current Facility-Administered Medications  Medication Dose Route Frequency Provider Last Rate Last Admin   acetaminophen  (TYLENOL ) tablet 650 mg  650 mg Oral Q6H PRN Jacquetta Sharlot GRADE, NP   650 mg at 02/29/24 2144   albuterol  (PROVENTIL ) (2.5 MG/3ML) 0.083% nebulizer solution 3 mL  3 mL Inhalation 5 X Daily PRN Jacquetta Sharlot GRADE, NP       alum & mag hydroxide-simeth (MAALOX/MYLANTA) 200-200-20 MG/5ML suspension 30 mL  30 mL Oral Q4H PRN Jacquetta Sharlot GRADE, NP       amLODipine  (NORVASC ) tablet 10 mg  10 mg Oral Daily Lord, Jamison Y, NP   10 mg at 03/01/24 9089   ARIPiprazole  (ABILIFY ) tablet 5 mg  5 mg Oral BID Brailey Buescher, MD   5 mg at  03/01/24 9089   divalproex  (DEPAKOTE  ER) 24 hr tablet 750 mg  750 mg Oral Daily Sage Hammill, MD   750 mg at 03/01/24 9090   fluticasone  furoate-vilanterol (BREO ELLIPTA ) 100-25 MCG/ACT 1 puff  1 puff Inhalation Daily Jacquetta Sharlot GRADE, NP   1 puff at 03/01/24 0740   folic acid  (  FOLVITE ) tablet 1 mg  1 mg Oral Daily Jacquetta Sharlot GRADE, NP   1 mg at 03/01/24 9089   gabapentin  (NEURONTIN ) capsule 300 mg  300 mg Oral Q8H Jacquetta Sharlot GRADE, NP   300 mg at 03/01/24 0648   metoprolol  tartrate (LOPRESSOR ) tablet 50 mg  50 mg Oral Q8H Lord, Jamison Y, NP   50 mg at 03/01/24 9351   mirabegron  ER (MYRBETRIQ ) tablet 25 mg  25 mg Oral Daily PRN Lord, Jamison Y, NP       mirtazapine  (REMERON ) tablet 45 mg  45 mg Oral QHS Jacquetta Sharlot GRADE, NP   45 mg at 02/29/24 2143   OLANZapine  (ZYPREXA ) tablet 5 mg  5 mg Oral BID PRN Jacquetta Sharlot GRADE, NP       Or   OLANZapine  (ZYPREXA ) injection 5 mg  5 mg Intramuscular BID PRN Lord, Jamison Y, NP       ondansetron  (ZOFRAN -ODT) disintegrating tablet 4 mg  4 mg Oral Q8H PRN Zalmen Wrightsman, MD   4 mg at 02/29/24 1453   pantoprazole  (PROTONIX ) EC tablet 40 mg  40 mg Oral Daily Lord, Jamison Y, NP   40 mg at 03/01/24 0910   polyethylene glycol (MIRALAX  / GLYCOLAX ) packet 17 g  17 g Oral Daily PRN Lord, Jamison Y, NP   17 g at 03/01/24 0915   senna-docusate (Senokot-S) tablet 2 tablet  2 tablet Oral Daily PRN Lord, Jamison Y, NP   2 tablet at 02/25/24 1414   traMADol  (ULTRAM ) tablet 50 mg  50 mg Oral Q8H PRN Lord, Jamison Y, NP   50 mg at 02/29/24 1713    Lab Results:  No results found for this or any previous visit (from the past 48 hours).   Blood Alcohol level:  Lab Results  Component Value Date   [facility] <11 03/11/2014   ETH <11 03/09/2014    Metabolic Disorder Labs: Lab Results  Component Value Date   HGBA1C  03/04/2009    5.1 (NOTE) The ADA recommends the following therapeutic goal for glycemic control related to Hgb A1c measurement: Goal of therapy: <6.5 Hgb A1c   Reference: American Diabetes Association: Clinical Practice Recommendations 2010, Diabetes Care, 2010, 33: (Suppl  1).   MPG 100 03/04/2009   No results found for: PROLACTIN Lab Results  Component Value Date   CHOL 211 (H) 09/14/2009   TRIG 70.0 09/14/2009   HDL 64.80 09/14/2009   CHOLHDL 3 09/14/2009   VLDL 14.0 09/14/2009   LDLCALC (H) 03/04/2009    145        Total Cholesterol/HDL:CHD Risk Coronary Heart Disease Risk Table                     Men   Women  1/2 Average Risk   3.4   3.3  Average Risk       5.0   4.4  2 X Average Risk   9.6   7.1  3 X Average Risk  23.4   11.0        Use the calculated Patient Ratio above and the CHD Risk Table to determine the patient's CHD Risk.        ATP III CLASSIFICATION (LDL):  <100     mg/dL   Optimal  899-870  mg/dL   Near or Above                    Optimal  130-159  mg/dL   Borderline  160-189  mg/dL   High  >809     mg/dL   Very High    Physical Findings: AIMS:  , ,  ,  ,    CIWA:    COWS:      Psychiatric Specialty Exam:  Presentation  General Appearance:  Casual  Eye Contact: Good  Speech: Clear and Coherent; Normal Rate  Speech Volume: Normal    Mood and Affect  Mood: Euthymic  Affect: Appropriate   Thought Process  Thought Processes: Linear  Descriptions of Associations:Circumstantial  Orientation:Full (Time, Place and Person)  Thought Content:Delusions  Hallucinations:denies  Ideas of Reference:Paranoia; Delusions  Suicidal Thoughts:denies  Homicidal Thoughts:denies   Sensorium  Memory: Remote Poor; Recent Fair; Immediate Fair  Judgment: Impaired  Insight: Fair   Chartered certified accountant: Fair  Attention Span: Fair  Recall: [employer] of Knowledge: Fair  Language: Fair   Psychomotor Activity  Psychomotor Activity: No data recorded  Musculoskeletal: Strength & Muscle Tone: decreased Gait & Station: unsteady Assets  Assets: Investment banker, corporate; Housing; Social Support    Physical Exam: Physical Exam Vitals and nursing note reviewed.    ROS Blood pressure (!) 133/54, pulse 79, temperature 97.9 F (36.6 C), resp. rate 18, height 5' 7 (1.702 m), weight 121.6 kg, SpO2 97%. Body mass index is 41.97 kg/m.  Diagnosis: Principal Problem:   Schizoaffective disorder, bipolar type [facility]) Clinical Decision Making: Patient with history of schizoaffective disorder, bipolar type admitted to the inpatient unit for visual hallucinations, paranoid delusions, worsening depressed mood, and anxiety. Patient needs to be monitored closely for safety.    Treatment Plan Summary:   Safety and Monitoring:             -- Involuntary admission to inpatient psychiatric unit for safety, stabilization and treatment             -- Daily contact with patient to assess and evaluate symptoms and progress in treatment             -- Patient's case to be discussed in multi-disciplinary team meeting             -- Observation Level: q15 minute checks             -- Vital signs:  q12 hours             -- Precautions: suicide, elopement, and assault   2. Psychiatric Diagnoses and Treatment: Schizoaffective disorder, bipolar type.   Abilify  5 mg BID Continue mirtazapine  45 mg at bedtime DisContinue Seroquel  XR 150 mg once at bedtime     -patient is noted to be more irritable  Depakote     ER   Increase to 750mg      -- The risks/benefits/side-effects/alternatives to this medication were discussed in detail with the patient and time was given for questions. The patient consents to medication trial.                -- Metabolic profile and EKG monitoring obtained while on an atypical antipsychotic (BMI: Lipid Panel: HbgA1c: QTc:)              -- Encouraged patient to participate in unit milieu and in scheduled group therapies                            3. Medical Issues Being Addressed:   Continue amlodipine  10 mg once daily and metoprolol  50 mg every 8  hours for  hypertension   4. Discharge Planning:   -- Social work and case management to assist with discharge planning and identification of hospital follow-up needs prior to discharge  -- Estimated LOS: 3-4 days  Jahbari Repinski, MD 03/01/2024, 1:35 PM

## 2024-03-01 NOTE — Progress Notes (Addendum)
 Patient requested miralax  - states last bm last night after taking miralax  but feels she needs to be cleaned out.  Patient requested heat be turned down in her room. Complained of feeling sweaty and nauseous from the heat and can't breath.  Assessed lungs - clear. Encouraged the patient to go to the dayroom where it was cooler while I put in request.  Maitenance came down and stated they had just turned the heat up at her request this morning.  Heat reset to 74.  Patient complaining room is too cold.  Advised patient we can not keep changing the thermostat and offered a blanket.

## 2024-03-01 NOTE — Group Note (Signed)
 Date:  03/01/2024 Time:  5:16 PM  Group Topic/Focus:  Healthy Communication:   The focus of this group is to discuss communication, barriers to communication, as well as healthy ways to communicate with others.    Participation Level:  Did Not Attend   Heidi Young 03/01/2024, 5:16 PM

## 2024-03-02 DIAGNOSIS — F250 Schizoaffective disorder, bipolar type: Secondary | ICD-10-CM | POA: Diagnosis not present

## 2024-03-02 NOTE — Progress Notes (Signed)
   03/02/24 1000  Psych Admission Type (Psych Patients Only)  Admission Status Involuntary  Psychosocial Assessment  Patient Complaints None  Eye Contact Fair  Facial Expression Animated  Affect Preoccupied  Speech Logical/coherent  Interaction Assertive  Motor Activity Slow  Appearance/Hygiene In scrubs  Behavior Characteristics Cooperative  Mood Pleasant  Thought Process  Coherency Disorganized  Content WDL  Delusions None reported or observed  Perception WDL  Hallucination None reported or observed  Judgment Impaired  Confusion Mild  Danger to Self  Current suicidal ideation? Denies  Agreement Not to Harm Self Yes  Description of Agreement verbal  Danger to Others  Danger to Others None reported or observed

## 2024-03-02 NOTE — Group Note (Signed)
 Physical/Occupational Therapy Group Note  Group Topic: Yoga  Group Date: 03/02/2024 Start Time: 1305 End Time: 1335 Facilitators: Rolly Magri, Alm Hamilton, PT   Group Description: Group participated with series of yoga poses, designed to emphasize functional sitting balance, core stability, generalized flexibility and overall posture.  Incorporated deep breathing techniques with poses, working to promote relaxation, mindfulness and focus with targeted activities.   Discussed benefits of yoga in improving mood and self-esteem, reducing stress and anxiety, and promoting functional strength and balance for each participant.  Discussed ways to integrate into each participant's daily routine.  Provided handout with written and pictorial descriptions of included yoga movements to be utilized as appropriate outside of group time.  Therapeutic Goal(s):  Demonstrate safe ability to participate with yoga poses during group activity. Identify one benefit of participation with yoga poses as part of each participant's exercise/movement routine. Identify 1-2 individual poses that participant feels most beneficial to his/her needs and that he/she can easily replicate outside of group.  Individual Participation: Pt actively participated with the discussion portion of the session but only minimally during the physical activity portion. Pt mostly on topic with discussion this session but occasionally was tangential but easily redirected.    Participation Level: Minimal   Participation Quality: Minimal Cues   Behavior: Appropriate   Speech/Thought Process: Loose association  and Tangential    Affect/Mood: Appropriate   Insight: Fair   Judgement: Fair   Modes of Intervention: Activity, Discussion, and Education  Patient Response to Interventions:  Attentive, Engaged, Interested , and Receptive   Plan: Continue to engage patient in PT/OT groups 1 - 2x/week.  CHARM Hamilton Bertin PT, DPT 03/02/24, 2:00  PM

## 2024-03-02 NOTE — Group Note (Signed)
 Date:  03/02/2024 Time:  9:27 PM  Group Topic/Focus:  Wrap-Up Group:   The focus of this group is to help patients review their daily goal of treatment and discuss progress on daily workbooks.    Participation Level:  Active  Participation Quality:  Appropriate  Affect:  Appropriate  Cognitive:  Alert  Insight: Appropriate  Engagement in Group:  Engaged  Modes of Intervention:  Discussion  Additional Comments:    Heidi Young CHRISTELLA Bunker 03/02/2024, 9:27 PM

## 2024-03-02 NOTE — BH IP Treatment Plan (Signed)
 Interdisciplinary Treatment and Diagnostic Plan Update  03/02/2024 Time of Session: 2:30PM Heidi Young MRN: [id number]  Principal Diagnosis: Schizoaffective disorder, bipolar type [facility])  Secondary Diagnoses: Principal Problem:   Schizoaffective disorder, bipolar type (HCC)   Current Medications:  Current Facility-Administered Medications  Medication Dose Route Frequency Provider Last Rate Last Admin   acetaminophen  (TYLENOL ) tablet 650 mg  650 mg Oral Q6H PRN Lord, Jamison Y, NP   650 mg at 03/02/24 1352   albuterol  (PROVENTIL ) (2.5 MG/3ML) 0.083% nebulizer solution 3 mL  3 mL Inhalation 5 X Daily PRN Jacquetta Sharlot GRADE, NP       alum & mag hydroxide-simeth (MAALOX/MYLANTA) 200-200-20 MG/5ML suspension 30 mL  30 mL Oral Q4H PRN Jacquetta Sharlot GRADE, NP       amLODipine  (NORVASC ) tablet 10 mg  10 mg Oral Daily Lord, Jamison Y, NP   10 mg at 03/02/24 9073   ARIPiprazole  (ABILIFY ) tablet 5 mg  5 mg Oral BID Jadapalle, Sree, MD   5 mg at 03/02/24 9073   divalproex  (DEPAKOTE  ER) 24 hr tablet 750 mg  750 mg Oral Daily Jadapalle, Sree, MD   750 mg at 03/02/24 9073   fluticasone  furoate-vilanterol (BREO ELLIPTA ) 100-25 MCG/ACT 1 puff  1 puff Inhalation Daily Jacquetta Sharlot GRADE, NP   1 puff at 03/02/24 9074   folic acid  (FOLVITE ) tablet 1 mg  1 mg Oral Daily Jacquetta Sharlot GRADE, NP   1 mg at 03/02/24 9073   gabapentin  (NEURONTIN ) capsule 300 mg  300 mg Oral Q8H Jacquetta Sharlot GRADE, NP   300 mg at 03/02/24 1352   metoprolol  tartrate (LOPRESSOR ) tablet 50 mg  50 mg Oral Q8H Lord, Jamison Y, NP   50 mg at 03/02/24 1351   mirabegron  ER (MYRBETRIQ ) tablet 25 mg  25 mg Oral Daily PRN Lord, Jamison Y, NP       mirtazapine  (REMERON ) tablet 45 mg  45 mg Oral QHS Lord, Jamison Y, NP   45 mg at 03/01/24 2120   OLANZapine  (ZYPREXA ) tablet 5 mg  5 mg Oral BID PRN Jacquetta Sharlot GRADE, NP   5 mg at 03/02/24 9761   Or   OLANZapine  (ZYPREXA ) injection 5 mg  5 mg Intramuscular BID PRN Lord, Jamison Y, NP       ondansetron   (ZOFRAN -ODT) disintegrating tablet 4 mg  4 mg Oral Q8H PRN Jadapalle, Sree, MD   4 mg at 02/29/24 1453   pantoprazole  (PROTONIX ) EC tablet 40 mg  40 mg Oral Daily Lord, Jamison Y, NP   40 mg at 03/02/24 9073   polyethylene glycol (MIRALAX  / GLYCOLAX ) packet 17 g  17 g Oral Daily PRN Lord, Jamison Y, NP   17 g at 03/02/24 9075   senna-docusate (Senokot-S) tablet 2 tablet  2 tablet Oral Daily PRN Lord, Jamison Y, NP   2 tablet at 02/25/24 1414   traMADol  (ULTRAM ) tablet 50 mg  50 mg Oral Q8H PRN Lord, Jamison Y, NP   50 mg at 03/01/24 2120   PTA Medications: Medications Prior to Admission  Medication Sig Dispense Refill Last Dose/Taking   acetaminophen  (TYLENOL ) 325 MG tablet Take 2 tablets (650 mg total) by mouth every 6 (six) hours as needed for mild pain (pain score 1-3) or fever (or Fever >/= 101).      albuterol  (PROVENTIL  HFA;VENTOLIN  HFA) 108 (90 BASE) MCG/ACT inhaler Inhale 2 puffs into the lungs 5 (five) times daily as needed for wheezing or shortness of breath.  albuterol  (PROVENTIL ) (2.5 MG/3ML) 0.083% nebulizer solution Take 2.5 mg by nebulization every 4 (four) hours as needed for wheezing or shortness of breath.      amLODipine  (NORVASC ) 10 MG tablet Take 10 mg by mouth daily.      atorvastatin  (LIPITOR) 20 MG tablet Take 20 mg by mouth at bedtime.      bisacodyl  (DULCOLAX) 10 MG suppository Place 1 suppository (10 mg total) rectally daily as needed for severe constipation (severe). 12 suppository 0    CONSTULOSE  10 GM/15ML solution Take 20 g by mouth daily as needed for moderate constipation.      EPINEPHrine (EPIPEN) 0.3 mg/0.3 mL IJ SOAJ injection Inject 0.3 mg into the muscle as needed (allergic reaction).      folic acid  (FOLVITE ) 1 MG tablet Take 1 mg by mouth daily.      furosemide  (LASIX ) 40 MG tablet Take 40 mg by mouth daily as needed (for fluid retention.).      gabapentin  (NEURONTIN ) 300 MG capsule Take 300 mg by mouth 3 (three) times daily.      levocetirizine (XYZAL)  5 MG tablet Take 5 mg by mouth every evening.      meclizine  (ANTIVERT ) 25 MG tablet Take 1 tablet (25 mg total) by mouth 3 (three) times daily as needed. (Patient taking differently: Take 25 mg by mouth 3 (three) times daily as needed for dizziness.) 30 tablet 0    melatonin 3 MG TABS tablet Take 3 mg by mouth at bedtime.      metoprolol  tartrate (LOPRESSOR ) 50 MG tablet Take 50 mg by mouth 3 (three) times daily.      mirabegron  ER (MYRBETRIQ ) 25 MG TB24 tablet Take 1 tablet (25 mg total) by mouth daily as needed (for urinary frequency with lasix ). 20 tablet 0    mirtazapine  (REMERON ) 45 MG tablet Take 45 mg by mouth at bedtime.      mometasone -formoterol  (DULERA ) 100-5 MCG/ACT AERO Inhale 2 puffs into the lungs 2 (two) times daily as needed for wheezing or shortness of breath.      ondansetron  (ZOFRAN -ODT) 4 MG disintegrating tablet Take 4 mg by mouth every 6 (six) hours as needed for nausea or vomiting.      pantoprazole  (PROTONIX ) 40 MG tablet Take 1 tablet (40 mg total) by mouth daily at 6 (six) AM. 30 tablet 1    polyethylene glycol (MIRALAX  / GLYCOLAX ) 17 g packet Take 17 g by mouth 2 (two) times daily. 14 each 0    potassium chloride  SA (K-DUR,KLOR-CON ) 20 MEQ tablet Take 20 mEq by mouth daily as needed (takes with lasix  for fluid retention).      QUEtiapine  Fumarate (SEROQUEL  XR) 150 MG 24 hr tablet Take 150 mg by mouth at bedtime.      tiZANidine  (ZANAFLEX ) 4 MG tablet Take 4 mg by mouth every 8 (eight) hours as needed for muscle spasms.      traMADol  (ULTRAM ) 50 MG tablet Take 50 mg by mouth 3 (three) times daily as needed (pain).      VITAMIN D , CHOLECALCIFEROL , PO Take 1 tablet by mouth daily.       Patient Stressors:    Patient Strengths:    Treatment Modalities: Medication Management, Group therapy, Case management,  1 to 1 session with clinician, Psychoeducation, Recreational therapy.   Physician Treatment Plan for Primary Diagnosis: Schizoaffective disorder, bipolar type  (HCC) Long Term Goal(s): Improvement in symptoms so as ready for discharge   Short Term Goals: Ability to identify  changes in lifestyle to reduce recurrence of condition will improve Ability to verbalize feelings will improve Ability to identify and develop effective coping behaviors will improve Ability to maintain clinical measurements within normal limits will improve  Medication Management: Evaluate patient's response, side effects, and tolerance of medication regimen.  Therapeutic Interventions: 1 to 1 sessions, Unit Group sessions and Medication administration.  Evaluation of Outcomes: Progressing  Physician Treatment Plan for Secondary Diagnosis: Principal Problem:   Schizoaffective disorder, bipolar type (HCC)  Long Term Goal(s): Improvement in symptoms so as ready for discharge   Short Term Goals: Ability to identify changes in lifestyle to reduce recurrence of condition will improve Ability to verbalize feelings will improve Ability to identify and develop effective coping behaviors will improve Ability to maintain clinical measurements within normal limits will improve     Medication Management: Evaluate patient's response, side effects, and tolerance of medication regimen.  Therapeutic Interventions: 1 to 1 sessions, Unit Group sessions and Medication administration.  Evaluation of Outcomes: Progressing   RN Treatment Plan for Primary Diagnosis: Schizoaffective disorder, bipolar type (HCC) Long Term Goal(s): Knowledge of disease and therapeutic regimen to maintain health will improve  Short Term Goals: Ability to demonstrate self-control, Ability to participate in decision making will improve, Ability to verbalize feelings will improve, Ability to disclose and discuss suicidal ideas, Ability to identify and develop effective coping behaviors will improve, and Compliance with prescribed medications will improve  Medication Management: RN will administer medications as  ordered by provider, will assess and evaluate patient's response and provide education to patient for prescribed medication. RN will report any adverse and/or side effects to prescribing provider.  Therapeutic Interventions: 1 on 1 counseling sessions, Psychoeducation, Medication administration, Evaluate responses to treatment, Monitor vital signs and CBGs as ordered, Perform/monitor CIWA, COWS, AIMS and Fall Risk screenings as ordered, Perform wound care treatments as ordered.  Evaluation of Outcomes: Progressing   LCSW Treatment Plan for Primary Diagnosis: Schizoaffective disorder, bipolar type (HCC) Long Term Goal(s): Safe transition to appropriate next level of care at discharge, Engage patient in therapeutic group addressing interpersonal concerns.  Short Term Goals: Engage patient in aftercare planning with referrals and resources, Increase social support, Increase ability to appropriately verbalize feelings, Increase emotional regulation, Facilitate acceptance of mental health diagnosis and concerns, and Increase skills for wellness and recovery  Therapeutic Interventions: Assess for all discharge needs, 1 to 1 time with Social worker, Explore available resources and support systems, Assess for adequacy in community support network, Educate family and significant other(s) on suicide prevention, Complete Psychosocial Assessment, Interpersonal group therapy.  Evaluation of Outcomes: Progressing   Progress in Treatment: Attending groups: Yes. Participating in groups: Yes. Taking medication as prescribed: Yes. Toleration medication: Yes. Family/Significant other contact made: Yes, individual(s) contacted:  SPE completed with the patient and patient's family Patient understands diagnosis: Yes. Discussing patient identified problems/goals with staff: Yes. Medical problems stabilized or resolved: Yes. Denies suicidal/homicidal ideation: Yes. Issues/concerns per patient self-inventory:  No. Other: none  New problem(s) identified: No, Describe:  None identified Updated 03/02/2024:  No changes at this time.    New Short Term/Long Term Goal(s): elimination of symptoms of psychosis, medication management for mood stabilization; elimination of SI thoughts; development of comprehensive mental wellness plan.  Updated 03/02/2024:  No changes at this time.    Patient Goals:  Pt unable to identify goals at this time Updated 03/02/2024:  No changes at this time.    Discharge Plan or Barriers: CSW will assist with appropriate  discharge planning Updated 03/02/2024:  Patient reports plans to return home. She will need assistance with home health needs and PCP will need to make a referral.    Reason for Continuation of Hospitalization: Delusions  Medication stabilization   Estimated Length of Stay: 1 to 7 days Updated 03/02/2024:  TBD   Last 3 [place] Suicide Severity Risk Score: Flowsheet Row Admission (Current) from 02/24/2024 in [facility] ED to Hosp-Admission (Discharged) from 02/18/2024 in [facility] from 02/16/2024 in [facility]  C-SSRS RISK CATEGORY No Risk No Risk No Risk    Last [facility] 2/9 Scores:     No data to display          Scribe for Treatment Team: Sherryle JINNY Margo, KEN 03/02/2024 4:16 PM

## 2024-03-02 NOTE — Group Note (Signed)
 Date:  03/02/2024 Time:  1:10 PM  Group Topic/Focus:  Building Self Esteem:   The Focus of this group is helping patients become aware of the effects of self-esteem on their lives, the things they and others do that enhance or undermine their self-esteem, seeing the relationship between their level of self-esteem and the choices they make and learning ways to enhance self-esteem.    Participation Level:  Active  Participation Quality:  Appropriate  Affect:  Appropriate  Cognitive:  Alert  Insight: Appropriate  Engagement in Group:  Engaged  Modes of Intervention:  Activity  Additional Comments:  N/A  Harlene LITTIE Gavel 03/02/2024, 1:10 PM

## 2024-03-02 NOTE — Progress Notes (Signed)
 0045- Pt is alert and oriented. Reports chest pain 10/10. Vital signs obtained. T97.5- RR 18, HR 81, b/p 130/66. Patient indicates pain under left breast. Skin warm and dry to touch, non-diaphoretic. No s/s of resp distress noted. No s/s of acute distress observed. 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 Pain medication was offered but refused.     Fifteen min later, pt requested lights be turned on by the technician. At this time, patient observed resting in bed with eyes closed, resp even and unlabored. No further complaints offered. Q 15 min checks maintained for safety.

## 2024-03-02 NOTE — Group Note (Signed)
 Recreation Therapy Group Note   Group Topic:General Recreation  Group Date: 03/02/2024 Start Time: 1535 End Time: 1630 Facilitators: Celestia Jeoffrey BRAVO, LRT, CTRS Location: Courtyard  Group Description: Outdoor Recreation. Patients had the option to play corn hole, ring toss, UNO, or listening to music while outside in the courtyard getting fresh air and sunlight. Patients helped water  and prune the raised garden beds. LRT and patients discussed things that they enjoy doing in their free time outside of the hospital. LRT encouraged patients to drink water  after being active and getting their heart rate up.   Goal Area(s) Addressed: Patient will identify leisure interests.  Patient will practice healthy decision making. Patient will engage in recreation activity.   Affect/Mood: N/A   Participation Level: Did not attend    Clinical Observations/Individualized Feedback: Patient did not attend group.   Plan: Continue to engage patient in RT group sessions 2-3x/week.   Jeoffrey BRAVO Celestia, LRT, CTRS 03/02/2024 5:34 PM

## 2024-03-02 NOTE — Plan of Care (Signed)

## 2024-03-02 NOTE — Plan of Care (Signed)
   Problem: Education: Goal: Emotional status will improve Outcome: Progressing Goal: Mental status will improve Outcome: Progressing

## 2024-03-02 NOTE — Group Note (Signed)
 Recreation Therapy Group Note   Group Topic:Relaxation  Group Date: 03/02/2024 Start Time: 1500 End Time: 1535 Facilitators: Celestia Jeoffrey BRAVO, LRT, CTRS Location: Dayroom  Group Description: PMR (Progressive Muscle Relaxation). LRT educates patients on what PMR is and the benefits that come from it. Patients are asked to sit with their feet flat on the floor while sitting up and all the way back in their chair, if possible. LRT and pts follow a prompt through a speaker that requires you to tense and release different muscles in their body and focus on their breathing. During session, lights are off and soft music is being played. Pts are given a stress ball to use if needed.   Goal Area(s) Addressed:  Patients will be able to describe progressive muscle relaxation.  Patient will practice using relaxation technique. Patient will identify a new coping skill.  Patient will follow multistep directions to reduce anxiety and stress.   Affect/Mood: N/A   Participation Level: Did not attend    Clinical Observations/Individualized Feedback: Patient did not attend group.   Plan: Continue to engage patient in RT group sessions 2-3x/week.   Jeoffrey BRAVO Celestia, LRT, CTRS 03/02/2024 5:09 PM

## 2024-03-02 NOTE — Progress Notes (Signed)
 [facility] MD Progress Note  03/02/2024 8:55 AM Heidi Young  MRN:  [id number] Heidi Young is a 78 y.o. female admitted: Presented to the ED by [facility] 02/18/2024 10:38 PM for IVC. She carries the psychiatric diagnoses of Schizoaffective disorder  with paranoid delusions. She meets criteria for schizophrenia based on a history of fixed false beliefs (e.g., voodoo-related delusions) and ongoing psychotic symptoms that have caused functional impairment over an extended period. Patient is admitted to [facility] unit with Q15 min safety monitoring. Multidisciplinary team approach is offered. Medication management; group/milieu therapy is offered.   Subjective:  Chart reviewed, case discussed in multidisciplinary meeting, patient seen during rounds.  On interview today patient is noted to be sitting in her bed.  She reports that she was able to go for outdoor recreational groups in the morning.  She continues to report feeling better.  She denies SI/HI/plan and denies auditory/visual hallucinations.  Per nursing patient is taking her medications with no reported side effects.  Patient discusses about her discharge planning before Wednesday.     Sleep: Fair  Appetite:  Poor  Past Psychiatric History: see h&P Family History:  Family History  Problem Relation Age of Onset   Coronary artery disease Mother 86   Heart failure Mother        congestive   Stroke Mother    Brain cancer Father 58   Hypertension Other        siblings   Aortic stenosis Other        sibling   Social History:  Social History   Substance and Sexual Activity  Alcohol Use No   Comment: denies     Social History   Substance and Sexual Activity  Drug Use Yes   Frequency: 1.0 times per week   Types: Marijuana   Comment: due to her chronic pain- last use 03/22/2013    Social History   Socioeconomic History   Marital status: Divorced    Spouse name: Not on file   Number of children: Not on file   Years of  education: Not on file   Highest education level: Not on file  Occupational History   Occupation: disability  Tobacco Use   Smoking status: Never    Passive exposure: Never   Smokeless tobacco: Not on file  Vaping Use   Vaping status: Not on file  Substance and Sexual Activity   Alcohol use: No    Comment: denies   Drug use: Yes    Frequency: 1.0 times per week    Types: Marijuana    Comment: due to her chronic pain- last use 03/22/2013   Sexual activity: Never    Birth control/protection: Abstinence  Other Topics Concern   Not on file  Social History Narrative   Not on file   Social Drivers of Health   Financial Resource Strain: Not on file  Food Insecurity: No Food Insecurity (02/24/2024)   Hunger Vital Sign    Worried About Running Out of Food in the Last Year: Never true    Ran Out of Food in the Last Year: Never true  Recent Concern: Food Insecurity - Food Insecurity Present (02/11/2024)   Hunger Vital Sign    Worried About Running Out of Food in the Last Year: Sometimes true    Ran Out of Food in the Last Year: Sometimes true  Transportation Needs: No Transportation Needs (02/24/2024)   PRAPARE - Transportation    Lack of Transportation (Medical): No    Lack  of Transportation (Non-Medical): No  Physical Activity: Not on file  Stress: Not on file  Social Connections: Moderately Isolated (02/24/2024)   Social Connection and Isolation Panel    Frequency of Communication with Friends and Family: More than three times a week    Frequency of Social Gatherings with Friends and Family: Never    Attends Religious Services: More than 4 times per year    Active Member of [employer] or Organizations: No    Attends Engineer, structural: Never    Marital Status: Divorced   Past Medical History:  Past Medical History:  Diagnosis Date   Asthma    Bipolar 1 disorder (HCC)    Cancer (HCC)    renal cell carcinoma   Cardiomyopathy    Carotid bruit    left   Chest pain     Chronic anemia    DJD (degenerative joint disease)    Fibromyalgia    GERD (gastroesophageal reflux disease)    on rare occas. - uses peptobismol   Heart murmur    History of kidney cancer    unknown type. had R kidney removed b/c of CA.    HLD (hyperlipidemia)    HTN (hypertension)    Kidney stones    Lumbar herniated disc    Obesity    Psychosis (HCC)    Schizophrenia (HCC)    Single kidney    Takotsubo cardiomyopathy    UTI (lower urinary tract infection)     Past Surgical History:  Procedure Laterality Date   BREAST BIOPSY Right 10/24/2022   US  RT BREAST BX W LOC DEV 1ST LESION IMG BX SPEC US  GUIDE 10/24/2022 [facility] MAMMOGRAPHY   BREAST LUMPECTOMY  90's   left, cyst   CARDIAC CATHETERIZATION  2010   no intervention   CESAREAN SECTION  1980   x1   CYSTECTOMY  90's   back (left)   NEPHRECTOMY  1990's   right, secondary to cyst   TOTAL KNEE ARTHROPLASTY  2005   right   TOTAL KNEE ARTHROPLASTY Left 03/25/2013   Procedure: TOTAL KNEE ARTHROPLASTY;  Surgeon: Norleen LITTIE Gavel, MD;  Location: [facility] OR;  Service: Orthopedics;  Laterality: Left;    Current Medications: Current Facility-Administered Medications  Medication Dose Route Frequency Provider Last Rate Last Admin   acetaminophen  (TYLENOL ) tablet 650 mg  650 mg Oral Q6H PRN Jacquetta Sharlot GRADE, NP   650 mg at 02/29/24 2144   albuterol  (PROVENTIL ) (2.5 MG/3ML) 0.083% nebulizer solution 3 mL  3 mL Inhalation 5 X Daily PRN Jacquetta Sharlot GRADE, NP       alum & mag hydroxide-simeth (MAALOX/MYLANTA) 200-200-20 MG/5ML suspension 30 mL  30 mL Oral Q4H PRN Jacquetta Sharlot GRADE, NP       amLODipine  (NORVASC ) tablet 10 mg  10 mg Oral Daily Lord, Jamison Y, NP   10 mg at 03/01/24 9089   ARIPiprazole  (ABILIFY ) tablet 5 mg  5 mg Oral BID Roderica Cathell, MD   5 mg at 03/01/24 2120   divalproex  (DEPAKOTE  ER) 24 hr tablet 750 mg  750 mg Oral Daily Berthel Bagnall, MD   750 mg at 03/01/24 9090   fluticasone  furoate-vilanterol (BREO ELLIPTA ) 100-25 MCG/ACT 1  puff  1 puff Inhalation Daily Lord, Jamison Y, NP   1 puff at 03/01/24 0740   folic acid  (FOLVITE ) tablet 1 mg  1 mg Oral Daily Lord, Jamison Y, NP   1 mg at 03/01/24 0910   gabapentin  (NEURONTIN ) capsule 300 mg  300  mg Oral Q8H Jacquetta Sharlot GRADE, NP   300 mg at 03/02/24 0655   metoprolol  tartrate (LOPRESSOR ) tablet 50 mg  50 mg Oral Q8H Lord, Jamison Y, NP   50 mg at 03/02/24 0655   mirabegron  ER (MYRBETRIQ ) tablet 25 mg  25 mg Oral Daily PRN Lord, Jamison Y, NP       mirtazapine  (REMERON ) tablet 45 mg  45 mg Oral QHS Lord, Jamison Y, NP   45 mg at 03/01/24 2120   OLANZapine  (ZYPREXA ) tablet 5 mg  5 mg Oral BID PRN Jacquetta Sharlot GRADE, NP   5 mg at 03/02/24 9761   Or   OLANZapine  (ZYPREXA ) injection 5 mg  5 mg Intramuscular BID PRN Lord, Jamison Y, NP       ondansetron  (ZOFRAN -ODT) disintegrating tablet 4 mg  4 mg Oral Q8H PRN Onya Eutsler, MD   4 mg at 02/29/24 1453   pantoprazole  (PROTONIX ) EC tablet 40 mg  40 mg Oral Daily Lord, Jamison Y, NP   40 mg at 03/01/24 0910   polyethylene glycol (MIRALAX  / GLYCOLAX ) packet 17 g  17 g Oral Daily PRN Lord, Jamison Y, NP   17 g at 03/01/24 0915   senna-docusate (Senokot-S) tablet 2 tablet  2 tablet Oral Daily PRN Lord, Jamison Y, NP   2 tablet at 02/25/24 1414   traMADol  (ULTRAM ) tablet 50 mg  50 mg Oral Q8H PRN Lord, Jamison Y, NP   50 mg at 03/01/24 2120    Lab Results:  No results found for this or any previous visit (from the past 48 hours).   Blood Alcohol level:  Lab Results  Component Value Date   [facility] <11 03/11/2014   ETH <11 03/09/2014    Metabolic Disorder Labs: Lab Results  Component Value Date   HGBA1C  03/04/2009    5.1 (NOTE) The ADA recommends the following therapeutic goal for glycemic control related to Hgb A1c measurement: Goal of therapy: <6.5 Hgb A1c  Reference: American Diabetes Association: Clinical Practice Recommendations 2010, Diabetes Care, 2010, 33: (Suppl  1).   MPG 100 03/04/2009   No results found for:  PROLACTIN Lab Results  Component Value Date   CHOL 211 (H) 09/14/2009   TRIG 70.0 09/14/2009   HDL 64.80 09/14/2009   CHOLHDL 3 09/14/2009   VLDL 14.0 09/14/2009   LDLCALC (H) 03/04/2009    145        Total Cholesterol/HDL:CHD Risk Coronary Heart Disease Risk Table                     Men   Women  1/2 Average Risk   3.4   3.3  Average Risk       5.0   4.4  2 X Average Risk   9.6   7.1  3 X Average Risk  23.4   11.0        Use the calculated Patient Ratio above and the CHD Risk Table to determine the patient's CHD Risk.        ATP III CLASSIFICATION (LDL):  <100     mg/dL   Optimal  899-870  mg/dL   Near or Above                    Optimal  130-159  mg/dL   Borderline  839-810  mg/dL   High  >809     mg/dL   Very High    Physical Findings: AIMS:  , ,  ,  ,  CIWA:    COWS:      Psychiatric Specialty Exam:  Presentation  General Appearance:  Casual  Eye Contact: Good  Speech: Clear and Coherent; Normal Rate  Speech Volume: Normal    Mood and Affect  Mood: Euthymic  Affect: Appropriate   Thought Process  Thought Processes: Linear  Descriptions of Associations:Circumstantial  Orientation:Full (Time, Place and Person)  Thought Content:Delusions  Hallucinations:denies  Ideas of Reference:Paranoia; Delusions  Suicidal Thoughts:denies  Homicidal Thoughts:denies   Sensorium  Memory: Remote Poor; Recent Fair; Immediate Fair  Judgment: Impaired  Insight: Fair   Chartered certified accountant: Fair  Attention Span: Fair  Recall: [employer] of Knowledge: Fair  Language: Fair   Psychomotor Activity  Psychomotor Activity: No data recorded  Musculoskeletal: Strength & Muscle Tone: decreased Gait & Station: unsteady Assets  Assets: Manufacturing systems engineer; Housing; Social Support    Physical Exam: Physical Exam Vitals and nursing note reviewed.    ROS Blood pressure (!) 146/74, pulse 77, temperature  97.9 F (36.6 C), temperature source Temporal, resp. rate 17, height 5' 7 (1.702 m), weight 121.6 kg, SpO2 97%. Body mass index is 41.97 kg/m.  Diagnosis: Principal Problem:   Schizoaffective disorder, bipolar type [facility]) Clinical Decision Making: Patient with history of schizoaffective disorder, bipolar type admitted to the inpatient unit for visual hallucinations, paranoid delusions, worsening depressed mood, and anxiety. Patient needs to be monitored closely for safety.  Patient is responding well to the treatment   Treatment Plan Summary:   Safety and Monitoring:             -- Involuntary admission to inpatient psychiatric unit for safety, stabilization and treatment             -- Daily contact with patient to assess and evaluate symptoms and progress in treatment             -- Patient's case to be discussed in multi-disciplinary team meeting             -- Observation Level: q15 minute checks             -- Vital signs:  q12 hours             -- Precautions: suicide, elopement, and assault   2. Psychiatric Diagnoses and Treatment: Schizoaffective disorder, bipolar type.   Abilify  5 mg BID Continue mirtazapine  45 mg at bedtime DisContinue Seroquel  XR 150 mg once at bedtime     -patient is noted to be more irritable  Depakote     ER   Increase to 750mg      -- The risks/benefits/side-effects/alternatives to this medication were discussed in detail with the patient and time was given for questions. The patient consents to medication trial.                -- Metabolic profile and EKG monitoring obtained while on an atypical antipsychotic (BMI: Lipid Panel: HbgA1c: QTc:)              -- Encouraged patient to participate in unit milieu and in scheduled group therapies                            3. Medical Issues Being Addressed:   Continue amlodipine  10 mg once daily and metoprolol  50 mg every 8 hours for hypertension   4. Discharge Planning:   -- Social work and case management  to assist with discharge planning and identification of hospital  follow-up needs prior to discharge  -- Estimated LOS: 3-4 days  Floris Neuhaus, MD 03/02/2024, 8:55 AM

## 2024-03-02 NOTE — Progress Notes (Signed)
   03/01/24 2000  Psych Admission Type (Psych Patients Only)  Admission Status Involuntary  Psychosocial Assessment  Patient Complaints Worrying;Anxiety  Eye Contact Fair  Facial Expression Animated  Affect Preoccupied  Speech Logical/coherent  Interaction Assertive  Motor Activity Slow  Appearance/Hygiene Disheveled  Behavior Characteristics Cooperative  Mood Preoccupied  Thought Process  Coherency Disorganized  Content WDL  Delusions None reported or observed  Perception WDL  Hallucination None reported or observed  Judgment Impaired  Confusion Mild  Danger to Self  Current suicidal ideation? Denies  Agreement Not to Harm Self Yes  Description of Agreement verbal  Danger to Others  Danger to Others None reported or observed   0129 Provider made aware that pt reported that she had 10/10 chest pain. VSS 130/66 HR 81 RR 18 SP02 97 RA tempt 97.5 F. Pt upset demanding and stating she wants to be transferred to the medical floor and that she has medical experience herself. RN offered pt pain medication and she refused. Pt in NAD. Pt is now back to sleep. POC.   0238 Pt noted to be restless and agitated. Pt repeatedly pressing call light and can be heard from the nurses station speaking loudly. Pt preoccupied about discharging. Pt reporting that the swelling on her body was not like this before coming to the hospital and wants something done about it. Pt reports I know why it is like this. Pt becoming upset talking about the topic and expressing feeling agitation and anxiety. Pt given prn Zyprexa . PRN noted effective. POC.

## 2024-03-03 NOTE — Plan of Care (Signed)
  Problem: Health Behavior/Discharge Planning: Goal: Compliance with treatment plan for underlying cause of condition will improve Outcome: Progressing   Problem: Physical Regulation: Goal: Ability to maintain clinical measurements within normal limits will improve Outcome: Progressing

## 2024-03-03 NOTE — Group Note (Signed)
 Recreation Therapy Group Note   Group Topic:Leisure Education  Group Date: 03/03/2024 Start Time: 1500 End Time: 1600 Facilitators: Celestia Jeoffrey BRAVO, LRT, CTRS Location: Dayroom  Group Description: Bingo. Patients played multiple rounds of bingo. LRT and pts discussed the definition of leisure, things they do in their free time outside of the hospital, and how bingo is also a leisure activity. Pts received soda as their bingo prize.   Goal Area(s) Addressed:  Patient will identify a current leisure interest.  Patient will learn the definition of "leisure". Patient will have the opportunity to try a new leisure activity. Patient will communicate with peers and LRT.   Affect/Mood: Appropriate   Participation Level: Active and Engaged   Participation Quality: Independent   Behavior: Appropriate, Calm, and Cooperative   Speech/Thought Process: Coherent   Insight: Fair   Judgement: Fair    Modes of Intervention: Cooperative Play   Patient Response to Interventions:  Attentive, Engaged, Interested , and Receptive   Education Outcome:  Acknowledges education   Clinical Observations/Individualized Feedback: Lizvet was active in their participation of session activities and group discussion. Pt won a Facilities manager. Pt interacted well with LRT and peers duration of session.    Plan: Continue to engage patient in RT group sessions 2-3x/week.   Jeoffrey BRAVO Celestia, LRT, CTRS 03/03/2024 5:24 PM

## 2024-03-03 NOTE — Group Note (Signed)
 Date:  03/03/2024 Time:  10:15 PM  Group Topic/Focus:  Wrap-Up Group:   The focus of this group is to help patients review their daily goal of treatment and discuss progress on daily workbooks.    Participation Level:  Did Not Attend  Participation Quality:     Affect:     Cognitive:     Insight: None  Engagement in Group:  None  Modes of Intervention:     Additional Comments:    Tommas CHRISTELLA Bunker 03/03/2024, 10:15 PM

## 2024-03-03 NOTE — Group Note (Signed)
 Recreation Therapy Group Note   Group Topic:General Recreation  Group Date: 03/03/2024 Start Time: 1100 End Time: 1130 Facilitators: Celestia Jeoffrey BRAVO, LRT, CTRS Location: Courtyard  Group Description: Outdoor Recreation. Patients had the option to play corn hole, ring toss, UNO, or listening to music while outside in the courtyard getting fresh air and sunlight. Patients helped water  and prune the raised garden beds. LRT and patients discussed things that they enjoy doing in their free time outside of the hospital. LRT encouraged patients to drink water  after being active and getting their heart rate up.   Goal Area(s) Addressed: Patient will identify leisure interests.  Patient will practice healthy decision making. Patient will engage in recreation activity.   Affect/Mood: N/A   Participation Level: Did not attend    Clinical Observations/Individualized Feedback: Patient did not attend group.   Plan: Continue to engage patient in RT group sessions 2-3x/week.   Jeoffrey BRAVO Celestia, LRT, CTRS 03/03/2024 1:41 PM

## 2024-03-03 NOTE — Plan of Care (Signed)
  Problem: Activity: Goal: Sleeping patterns will improve Outcome: Progressing   Problem: Coping: Goal: Ability to demonstrate self-control will improve Outcome: Progressing   Problem: Safety: Goal: Periods of time without injury will increase Outcome: Progressing   

## 2024-03-03 NOTE — Progress Notes (Signed)
   03/03/24 2000  Psych Admission Type (Psych Patients Only)  Admission Status Involuntary  Psychosocial Assessment  Patient Complaints Anxiety;Malaise  Eye Contact Fair  Facial Expression Anxious  Affect Anxious  Speech Logical/coherent  Interaction Assertive  Motor Activity Slow  Appearance/Hygiene Layered clothes  Behavior Characteristics Cooperative;Anxious  Mood Anxious;Pleasant  Thought Process  Coherency Disorganized  Content WDL  Delusions None reported or observed  Perception WDL  Hallucination None reported or observed  Judgment Impaired  Confusion Mild  Danger to Self  Current suicidal ideation? Denies  Danger to Others  Danger to Others None reported or observed   Progress note   D: Pt seen in her room in bed. Pt denies SI, HI, AVH. Pt rates pain  10/10 as headache and generalized pain. Pt rates anxiety  a lot/10 and depression  0/10. Pt states she is feeling hot and cold. VS WNL. Pt head of bed elevated. Pt stated that this feels better. Pt foot of bed elevated. Pt has swelling in lower extremities. Pt does not remember if she is prescribed Lasix  or not for that swelling. Pt given extra blankets because she states she is cold. No other concerns noted at this time.  A: Pt provided support and encouragement. Pt given scheduled medication as prescribed. PRNs as appropriate. Q15 min checks for safety.   R: Pt safe on the unit. Will continue to monitor.

## 2024-03-03 NOTE — Progress Notes (Signed)
   03/03/24 1200  Psych Admission Type (Psych Patients Only)  Admission Status Involuntary  Psychosocial Assessment  Patient Complaints None  Eye Contact Fair  Facial Expression Animated  Affect Preoccupied  Speech Logical/coherent  Interaction Assertive  Motor Activity Slow  Appearance/Hygiene In scrubs  Behavior Characteristics Cooperative  Mood Pleasant  Thought Process  Coherency Disorganized  Content WDL  Delusions None reported or observed  Perception WDL  Hallucination None reported or observed  Judgment Impaired  Confusion Mild  Danger to Self  Current suicidal ideation? Denies  Agreement Not to Harm Self Yes  Description of Agreement verbal  Danger to Others  Danger to Others None reported or observed

## 2024-03-03 NOTE — Group Note (Signed)
 Date:  03/03/2024 Time:  10:24 AM  Group Topic/Focus:  Self Esteem Action Plan:   The focus of this group is to help patients create a plan to continue to build self-esteem after discharge.    Participation Level:  Did Not Attend  Harlene LITTIE Gavel 03/03/2024, 10:24 AM

## 2024-03-03 NOTE — Progress Notes (Signed)
 [facility] MD Progress Note  03/03/2024 8:15 PM Heidi Young  MRN:  [id number] Heidi Young is a 78 y.o. female admitted: Presented to the ED by [facility] 02/18/2024 10:38 PM for IVC. She carries the psychiatric diagnoses of Schizoaffective disorder  with paranoid delusions. She meets criteria for schizophrenia based on a history of fixed false beliefs (e.g., voodoo-related delusions) and ongoing psychotic symptoms that have caused functional impairment over an extended period. Patient is admitted to [facility] unit with Q15 min safety monitoring. Multidisciplinary team approach is offered. Medication management; group/milieu therapy is offered.   Subjective:  Chart reviewed, case discussed in multidisciplinary meeting, patient seen during rounds.  Today on interview patient is noted to be resting in her bed.  She offers no complaints.  She reports that medications are doing very well and she is ready to go home.  She denies any SI/HI/plan and denies hallucinations.  Discussed with social work team to work with the family on transition of care to outpatient.  Patient is able to participate in groups and even outdoor groups.   Sleep: Fair  Appetite:  Poor  Past Psychiatric History: see h&P Family History:  Family History  Problem Relation Age of Onset   Coronary artery disease Mother 76   Heart failure Mother        congestive   Stroke Mother    Brain cancer Father 76   Hypertension Other        siblings   Aortic stenosis Other        sibling   Social History:  Social History   Substance and Sexual Activity  Alcohol Use No   Comment: denies     Social History   Substance and Sexual Activity  Drug Use Yes   Frequency: 1.0 times per week   Types: Marijuana   Comment: due to her chronic pain- last use 03/22/2013    Social History   Socioeconomic History   Marital status: Divorced    Spouse name: Not on file   Number of children: Not on file   Years of education: Not on file    Highest education level: Not on file  Occupational History   Occupation: disability  Tobacco Use   Smoking status: Never    Passive exposure: Never   Smokeless tobacco: Not on file  Vaping Use   Vaping status: Not on file  Substance and Sexual Activity   Alcohol use: No    Comment: denies   Drug use: Yes    Frequency: 1.0 times per week    Types: Marijuana    Comment: due to her chronic pain- last use 03/22/2013   Sexual activity: Never    Birth control/protection: Abstinence  Other Topics Concern   Not on file  Social History Narrative   Not on file   Social Drivers of Health   Financial Resource Strain: Not on file  Food Insecurity: No Food Insecurity (02/24/2024)   Hunger Vital Sign    Worried About Running Out of Food in the Last Year: Never true    Ran Out of Food in the Last Year: Never true  Recent Concern: Food Insecurity - Food Insecurity Present (02/11/2024)   Hunger Vital Sign    Worried About Running Out of Food in the Last Year: Sometimes true    Ran Out of Food in the Last Year: Sometimes true  Transportation Needs: No Transportation Needs (02/24/2024)   PRAPARE - Administrator, Civil Service (Medical): No  Lack of Transportation (Non-Medical): No  Physical Activity: Not on file  Stress: Not on file  Social Connections: Moderately Isolated (02/24/2024)   Social Connection and Isolation Panel    Frequency of Communication with Friends and Family: More than three times a week    Frequency of Social Gatherings with Friends and Family: Never    Attends Religious Services: More than 4 times per year    Active Member of [employer] or Organizations: No    Attends Engineer, structural: Never    Marital Status: Divorced   Past Medical History:  Past Medical History:  Diagnosis Date   Asthma    Bipolar 1 disorder (HCC)    Cancer (HCC)    renal cell carcinoma   Cardiomyopathy    Carotid bruit    left   Chest pain    Chronic anemia    DJD  (degenerative joint disease)    Fibromyalgia    GERD (gastroesophageal reflux disease)    on rare occas. - uses peptobismol   Heart murmur    History of kidney cancer    unknown type. had R kidney removed b/c of CA.    HLD (hyperlipidemia)    HTN (hypertension)    Kidney stones    Lumbar herniated disc    Obesity    Psychosis (HCC)    Schizophrenia (HCC)    Single kidney    Takotsubo cardiomyopathy    UTI (lower urinary tract infection)     Past Surgical History:  Procedure Laterality Date   BREAST BIOPSY Right 10/24/2022   US  RT BREAST BX W LOC DEV 1ST LESION IMG BX SPEC US  GUIDE 10/24/2022 [facility] MAMMOGRAPHY   BREAST LUMPECTOMY  90's   left, cyst   CARDIAC CATHETERIZATION  2010   no intervention   CESAREAN SECTION  1980   x1   CYSTECTOMY  90's   back (left)   NEPHRECTOMY  1990's   right, secondary to cyst   TOTAL KNEE ARTHROPLASTY  2005   right   TOTAL KNEE ARTHROPLASTY Left 03/25/2013   Procedure: TOTAL KNEE ARTHROPLASTY;  Surgeon: Norleen LITTIE Gavel, MD;  Location: [facility] OR;  Service: Orthopedics;  Laterality: Left;    Current Medications: Current Facility-Administered Medications  Medication Dose Route Frequency Provider Last Rate Last Admin   acetaminophen  (TYLENOL ) tablet 650 mg  650 mg Oral Q6H PRN Jacquetta Sharlot GRADE, NP   650 mg at 03/02/24 1352   albuterol  (PROVENTIL ) (2.5 MG/3ML) 0.083% nebulizer solution 3 mL  3 mL Inhalation 5 X Daily PRN Jacquetta Sharlot GRADE, NP       alum & mag hydroxide-simeth (MAALOX/MYLANTA) 200-200-20 MG/5ML suspension 30 mL  30 mL Oral Q4H PRN Jacquetta Sharlot GRADE, NP       amLODipine  (NORVASC ) tablet 10 mg  10 mg Oral Daily Lord, Jamison Y, NP   10 mg at 03/03/24 9063   ARIPiprazole  (ABILIFY ) tablet 5 mg  5 mg Oral BID Tavoris Brisk, MD   5 mg at 03/03/24 9063   divalproex  (DEPAKOTE  ER) 24 hr tablet 750 mg  750 mg Oral Daily Sherrick Araki, MD   750 mg at 03/03/24 9063   fluticasone  furoate-vilanterol (BREO ELLIPTA ) 100-25 MCG/ACT 1 puff  1 puff  Inhalation Daily Jacquetta Sharlot GRADE, NP   1 puff at 03/03/24 9272   folic acid  (FOLVITE ) tablet 1 mg  1 mg Oral Daily Jacquetta Sharlot GRADE, NP   1 mg at 03/03/24 9063   gabapentin  (NEURONTIN ) capsule 300 mg  300 mg Oral Q8H Jacquetta Sharlot GRADE, NP   300 mg at 03/03/24 1425   metoprolol  tartrate (LOPRESSOR ) tablet 50 mg  50 mg Oral Q8H Lord, Jamison Y, NP   50 mg at 03/03/24 1425   mirabegron  ER (MYRBETRIQ ) tablet 25 mg  25 mg Oral Daily PRN Lord, Jamison Y, NP       mirtazapine  (REMERON ) tablet 45 mg  45 mg Oral QHS Lord, Jamison Y, NP   45 mg at 03/02/24 2141   OLANZapine  (ZYPREXA ) tablet 5 mg  5 mg Oral BID PRN Lord, Jamison Y, NP   5 mg at 03/02/24 9761   Or   OLANZapine  (ZYPREXA ) injection 5 mg  5 mg Intramuscular BID PRN Lord, Jamison Y, NP       ondansetron  (ZOFRAN -ODT) disintegrating tablet 4 mg  4 mg Oral Q8H PRN Antionette Luster, MD   4 mg at 02/29/24 1453   pantoprazole  (PROTONIX ) EC tablet 40 mg  40 mg Oral Daily Lord, Jamison Y, NP   40 mg at 03/03/24 0936   polyethylene glycol (MIRALAX  / GLYCOLAX ) packet 17 g  17 g Oral Daily PRN Lord, Jamison Y, NP   17 g at 03/02/24 9075   senna-docusate (Senokot-S) tablet 2 tablet  2 tablet Oral Daily PRN Lord, Jamison Y, NP   2 tablet at 02/25/24 1414   traMADol  (ULTRAM ) tablet 50 mg  50 mg Oral Q8H PRN Lord, Jamison Y, NP   50 mg at 03/02/24 2141    Lab Results:  No results found for this or any previous visit (from the past 48 hours).   Blood Alcohol level:  Lab Results  Component Value Date   [facility] <11 03/11/2014   ETH <11 03/09/2014    Metabolic Disorder Labs: Lab Results  Component Value Date   HGBA1C  03/04/2009    5.1 (NOTE) The ADA recommends the following therapeutic goal for glycemic control related to Hgb A1c measurement: Goal of therapy: <6.5 Hgb A1c  Reference: American Diabetes Association: Clinical Practice Recommendations 2010, Diabetes Care, 2010, 33: (Suppl  1).   MPG 100 03/04/2009   No results found for: PROLACTIN Lab  Results  Component Value Date   CHOL 211 (H) 09/14/2009   TRIG 70.0 09/14/2009   HDL 64.80 09/14/2009   CHOLHDL 3 09/14/2009   VLDL 14.0 09/14/2009   LDLCALC (H) 03/04/2009    145        Total Cholesterol/HDL:CHD Risk Coronary Heart Disease Risk Table                     Men   Women  1/2 Average Risk   3.4   3.3  Average Risk       5.0   4.4  2 X Average Risk   9.6   7.1  3 X Average Risk  23.4   11.0        Use the calculated Patient Ratio above and the CHD Risk Table to determine the patient's CHD Risk.        ATP III CLASSIFICATION (LDL):  <100     mg/dL   Optimal  899-870  mg/dL   Near or Above                    Optimal  130-159  mg/dL   Borderline  839-810  mg/dL   High  >809     mg/dL   Very High    Physical Findings: AIMS:  , ,  ,  ,  CIWA:    COWS:      Psychiatric Specialty Exam:  Presentation  General Appearance:  Casual  Eye Contact: Good  Speech: Clear and Coherent; Normal Rate  Speech Volume: Normal    Mood and Affect  Mood: Euthymic  Affect: Appropriate   Thought Process  Thought Processes: Linear  Descriptions of Associations:Circumstantial  Orientation:Full (Time, Place and Person)  Thought Content:linear Hallucinations:denies  Ideas of Reference:none Suicidal Thoughts:denies  Homicidal Thoughts:denies   Sensorium  Memory: Remote Poor; Recent Fair; Immediate Fair  Judgment: Impaired  Insight: Fair   Chartered certified accountant: Fair  Attention Span: Fair  Recall: [employer] of Knowledge: Fair  Language: Fair   Psychomotor Activity  Psychomotor Activity: No data recorded  Musculoskeletal: Strength & Muscle Tone: decreased Gait & Station: unsteady Assets  Assets: Manufacturing systems engineer; Housing; Social Support    Physical Exam: Physical Exam Vitals and nursing note reviewed.    ROS Blood pressure 131/73, pulse 76, temperature 98.4 F (36.9 C), resp. rate 14, height 5' 7  (1.702 m), weight 121.6 kg, SpO2 100%. Body mass index is 41.97 kg/m.  Diagnosis: Principal Problem:   Schizoaffective disorder, bipolar type [facility]) Clinical Decision Making: Patient with history of schizoaffective disorder, bipolar type admitted to the inpatient unit for visual hallucinations, paranoid delusions, worsening depressed mood, and anxiety. Patient needs to be monitored closely for safety.  Patient is responding well to the treatment   Treatment Plan Summary:   Safety and Monitoring:             -- Involuntary admission to inpatient psychiatric unit for safety, stabilization and treatment             -- Daily contact with patient to assess and evaluate symptoms and progress in treatment             -- Patient's case to be discussed in multi-disciplinary team meeting             -- Observation Level: q15 minute checks             -- Vital signs:  q12 hours             -- Precautions: suicide, elopement, and assault   2. Psychiatric Diagnoses and Treatment: Schizoaffective disorder, bipolar type.   Abilify  5 mg BID Continue mirtazapine  45 mg at bedtime DisContinue Seroquel  XR 150 mg once at bedtime     -patient is noted to be more irritable  Depakote     ER   Increase to 750mg      -- The risks/benefits/side-effects/alternatives to this medication were discussed in detail with the patient and time was given for questions. The patient consents to medication trial.                -- Metabolic profile and EKG monitoring obtained while on an atypical antipsychotic (BMI: Lipid Panel: HbgA1c: QTc:)              -- Encouraged patient to participate in unit milieu and in scheduled group therapies                            3. Medical Issues Being Addressed:   Continue amlodipine  10 mg once daily and metoprolol  50 mg every 8 hours for hypertension   4. Discharge Planning:   -- Social work and case management to assist with discharge planning and identification of hospital follow-up  needs prior to discharge  --  Estimated LOS: 3-4 days  Shelbi Vaccaro, MD 03/03/2024, 8:15 PM

## 2024-03-04 NOTE — [facility] Counselor (Signed)
 CSW spoke with the patient to review aftercare.   Pt requested that CSW speak with her son.    CSW called son who requested that aftercare referrals for an ACTT team be made.   CSW will make referrals to [facility] and [facility] [employer] request.  Son also requested that CSW assist with scheduling an aftercare appointment in the interim as ACTT teams are reviewing patient.  Sherryle Margo, MSW, LCSW 03/04/2024 4:05 PM

## 2024-03-04 NOTE — Plan of Care (Signed)
   Problem: Education: Goal: Emotional status will improve Outcome: Progressing Goal: Mental status will improve Outcome: Progressing   Problem: Activity: Goal: Interest or engagement in activities will improve Outcome: Progressing Goal: Sleeping patterns will improve Outcome: Progressing   Problem: Coping: Goal: Ability to verbalize frustrations and anger appropriately will improve Outcome: Progressing Goal: Ability to demonstrate self-control will improve Outcome: Progressing

## 2024-03-04 NOTE — Progress Notes (Signed)
 [facility] MD Progress Note  03/04/2024 1:29 PM Heidi Young  MRN:  [id number] Heidi Young is a 78 y.o. female admitted: Presented to the ED by [facility] 02/18/2024 10:38 PM for IVC. She carries the psychiatric diagnoses of Schizoaffective disorder  with paranoid delusions. She meets criteria for schizophrenia based on a history of fixed false beliefs (e.g., voodoo-related delusions) and ongoing psychotic symptoms that have caused functional impairment over an extended period. Patient is admitted to [facility] unit with Q15 min safety monitoring. Multidisciplinary team approach is offered. Medication management; group/milieu therapy is offered.   Subjective:  Chart reviewed, case discussed in multidisciplinary meeting, patient seen during rounds. Today on interview patient is noted to be sitting in her bed. She offers no complaints. She reports that medications are doing very well and she is ready to go home. She denies any SI/HI/plan and denies hallucinations. Patient is able to participate in groups and even outdoor groups. She expressed that she wants to make sure her son isn't too busy and would like to go home tomorrow or the next day. Discussed with social work team to work with the family on transition of care to outpatient.    Sleep: Fair  Appetite:  Poor  Past Psychiatric History: see h&P Family History:  Family History  Problem Relation Age of Onset   Coronary artery disease Mother 26   Heart failure Mother        congestive   Stroke Mother    Brain cancer Father 18   Hypertension Other        siblings   Aortic stenosis Other        sibling   Social History:  Social History   Substance and Sexual Activity  Alcohol Use No   Comment: denies     Social History   Substance and Sexual Activity  Drug Use Yes   Frequency: 1.0 times per week   Types: Marijuana   Comment: due to her chronic pain- last use 03/22/2013    Social History   Socioeconomic History   Marital status:  Divorced    Spouse name: Not on file   Number of children: Not on file   Years of education: Not on file   Highest education level: Not on file  Occupational History   Occupation: disability  Tobacco Use   Smoking status: Never    Passive exposure: Never   Smokeless tobacco: Not on file  Vaping Use   Vaping status: Not on file  Substance and Sexual Activity   Alcohol use: No    Comment: denies   Drug use: Yes    Frequency: 1.0 times per week    Types: Marijuana    Comment: due to her chronic pain- last use 03/22/2013   Sexual activity: Never    Birth control/protection: Abstinence  Other Topics Concern   Not on file  Social History Narrative   Not on file   Social Drivers of Health   Financial Resource Strain: Not on file  Food Insecurity: No Food Insecurity (02/24/2024)   Hunger Vital Sign    Worried About Running Out of Food in the Last Year: Never true    Ran Out of Food in the Last Year: Never true  Recent Concern: Food Insecurity - Food Insecurity Present (02/11/2024)   Hunger Vital Sign    Worried About Running Out of Food in the Last Year: Sometimes true    Ran Out of Food in the Last Year: Sometimes true  Transportation  Needs: No Transportation Needs (02/24/2024)   PRAPARE - Administrator, Civil Service (Medical): No    Lack of Transportation (Non-Medical): No  Physical Activity: Not on file  Stress: Not on file  Social Connections: Moderately Isolated (02/24/2024)   Social Connection and Isolation Panel    Frequency of Communication with Friends and Family: More than three times a week    Frequency of Social Gatherings with Friends and Family: Never    Attends Religious Services: More than 4 times per year    Active Member of [employer] or Organizations: No    Attends Engineer, structural: Never    Marital Status: Divorced   Past Medical History:  Past Medical History:  Diagnosis Date   Asthma    Bipolar 1 disorder (HCC)    Cancer (HCC)     renal cell carcinoma   Cardiomyopathy    Carotid bruit    left   Chest pain    Chronic anemia    DJD (degenerative joint disease)    Fibromyalgia    GERD (gastroesophageal reflux disease)    on rare occas. - uses peptobismol   Heart murmur    History of kidney cancer    unknown type. had R kidney removed b/c of CA.    HLD (hyperlipidemia)    HTN (hypertension)    Kidney stones    Lumbar herniated disc    Obesity    Psychosis (HCC)    Schizophrenia (HCC)    Single kidney    Takotsubo cardiomyopathy    UTI (lower urinary tract infection)     Past Surgical History:  Procedure Laterality Date   BREAST BIOPSY Right 10/24/2022   US  RT BREAST BX W LOC DEV 1ST LESION IMG BX SPEC US  GUIDE 10/24/2022 [facility]   BREAST LUMPECTOMY  90's   left, cyst   CARDIAC CATHETERIZATION  2010   no intervention   CESAREAN SECTION  1980   x1   CYSTECTOMY  90's   back (left)   NEPHRECTOMY  1990's   right, secondary to cyst   TOTAL KNEE ARTHROPLASTY  2005   right   TOTAL KNEE ARTHROPLASTY Left 03/25/2013   Procedure: TOTAL KNEE ARTHROPLASTY;  Surgeon: Norleen LITTIE Gavel, MD;  Location: [facility] OR;  Service: Orthopedics;  Laterality: Left;    Current Medications: Current Facility-Administered Medications  Medication Dose Route Frequency Provider Last Rate Last Admin   acetaminophen  (TYLENOL ) tablet 650 mg  650 mg Oral Q6H PRN Jacquetta Sharlot GRADE, NP   650 mg at 03/02/24 1352   albuterol  (PROVENTIL ) (2.5 MG/3ML) 0.083% nebulizer solution 3 mL  3 mL Inhalation 5 X Daily PRN Jacquetta Sharlot GRADE, NP   3 mL at 03/04/24 0534   alum & mag hydroxide-simeth (MAALOX/MYLANTA) 200-200-20 MG/5ML suspension 30 mL  30 mL Oral Q4H PRN Jacquetta Sharlot GRADE, NP       amLODipine  (NORVASC ) tablet 10 mg  10 mg Oral Daily Jacquetta Sharlot GRADE, NP   10 mg at 03/04/24 9089   ARIPiprazole  (ABILIFY ) tablet 5 mg  5 mg Oral BID Shyan Scalisi, MD   5 mg at 03/04/24 0913   divalproex  (DEPAKOTE  ER) 24 hr tablet 750 mg  750 mg Oral Daily  Zerah Hilyer, MD   750 mg at 03/04/24 0912   fluticasone  furoate-vilanterol (BREO ELLIPTA ) 100-25 MCG/ACT 1 puff  1 puff Inhalation Daily Jacquetta Sharlot GRADE, NP   1 puff at 03/04/24 0732   folic acid  (FOLVITE ) tablet 1 mg  1 mg Oral Daily Jacquetta Sharlot GRADE, NP   1 mg at 03/04/24 9088   gabapentin  (NEURONTIN ) capsule 300 mg  300 mg Oral Q8H Jacquetta Sharlot GRADE, NP   300 mg at 03/04/24 0459   metoprolol  tartrate (LOPRESSOR ) tablet 50 mg  50 mg Oral Q8H Jacquetta Sharlot GRADE, NP   50 mg at 03/04/24 0459   mirabegron  ER (MYRBETRIQ ) tablet 25 mg  25 mg Oral Daily PRN Lord, Jamison Y, NP       mirtazapine  (REMERON ) tablet 45 mg  45 mg Oral QHS Lord, Jamison Y, NP   45 mg at 03/03/24 2105   OLANZapine  (ZYPREXA ) tablet 5 mg  5 mg Oral BID PRN Jacquetta Sharlot GRADE, NP   5 mg at 03/02/24 9761   Or   OLANZapine  (ZYPREXA ) injection 5 mg  5 mg Intramuscular BID PRN Lord, Jamison Y, NP       ondansetron  (ZOFRAN -ODT) disintegrating tablet 4 mg  4 mg Oral Q8H PRN Enaya Howze, MD   4 mg at 02/29/24 1453   pantoprazole  (PROTONIX ) EC tablet 40 mg  40 mg Oral Daily Lord, Jamison Y, NP   40 mg at 03/04/24 0913   polyethylene glycol (MIRALAX  / GLYCOLAX ) packet 17 g  17 g Oral Daily PRN Lord, Jamison Y, NP   17 g at 03/02/24 9075   senna-docusate (Senokot-S) tablet 2 tablet  2 tablet Oral Daily PRN Lord, Jamison Y, NP   2 tablet at 02/25/24 1414   traMADol  (ULTRAM ) tablet 50 mg  50 mg Oral Q8H PRN Lord, Jamison Y, NP   50 mg at 03/04/24 1112    Lab Results:  No results found for this or any previous visit (from the past 48 hours).   Blood Alcohol level:  Lab Results  Component Value Date   [facility] <11 03/11/2014   ETH <11 03/09/2014    Metabolic Disorder Labs: Lab Results  Component Value Date   HGBA1C  03/04/2009    5.1 (NOTE) The ADA recommends the following therapeutic goal for glycemic control related to Hgb A1c measurement: Goal of therapy: <6.5 Hgb A1c  Reference: American Diabetes Association: Clinical Practice  Recommendations 2010, Diabetes Care, 2010, 33: (Suppl  1).   MPG 100 03/04/2009   No results found for: PROLACTIN Lab Results  Component Value Date   CHOL 211 (H) 09/14/2009   TRIG 70.0 09/14/2009   HDL 64.80 09/14/2009   CHOLHDL 3 09/14/2009   VLDL 14.0 09/14/2009   LDLCALC (H) 03/04/2009    145        Total Cholesterol/HDL:CHD Risk Coronary Heart Disease Risk Table                     Men   Women  1/2 Average Risk   3.4   3.3  Average Risk       5.0   4.4  2 X Average Risk   9.6   7.1  3 X Average Risk  23.4   11.0        Use the calculated Patient Ratio above and the CHD Risk Table to determine the patient's CHD Risk.        ATP III CLASSIFICATION (LDL):  <100     mg/dL   Optimal  899-870  mg/dL   Near or Above                    Optimal  130-159  mg/dL   Borderline  839-810  mg/dL   High  >809     mg/dL   Very High    Physical Findings: AIMS:  , ,  ,  ,    CIWA:    COWS:      Psychiatric Specialty Exam:  Presentation  General Appearance:  Casual  Eye Contact: Good  Speech: Clear and Coherent; Normal Rate  Speech Volume: Normal    Mood and Affect  Mood: Euthymic  Affect: Appropriate   Thought Process  Thought Processes: Linear  Descriptions of Associations:Circumstantial  Orientation:Full (Time, Place and Person)  Thought Content:linear Hallucinations:denies  Ideas of Reference:none Suicidal Thoughts:denies  Homicidal Thoughts:denies   Sensorium  Memory: Remote Poor; Recent Fair; Immediate Fair  Judgment: Impaired  Insight: Fair   Chartered certified accountant: Fair  Attention Span: Fair  Recall: [employer] of Knowledge: Fair  Language: Fair   Psychomotor Activity  Psychomotor Activity: No data recorded  Musculoskeletal: Strength & Muscle Tone: decreased Gait & Station: unsteady Assets  Assets: Manufacturing systems engineer; Housing; Social Support    Physical Exam: Physical Exam Vitals and  nursing note reviewed.    ROS Blood pressure (!) 155/86, pulse 82, temperature 97.7 F (36.5 C), resp. rate 16, height 5' 7 (1.702 m), weight 121.6 kg, SpO2 99%. Body mass index is 41.97 kg/m.  Diagnosis: Principal Problem:   Schizoaffective disorder, bipolar type [facility]) Clinical Decision Making: Patient with history of schizoaffective disorder, bipolar type admitted to the inpatient unit for visual hallucinations, paranoid delusions, worsening depressed mood, and anxiety. Patient needs to be monitored closely for safety.  Patient is responding well to the treatment   Treatment Plan Summary:   Safety and Monitoring:             -- Involuntary admission to inpatient psychiatric unit for safety, stabilization and treatment             -- Daily contact with patient to assess and evaluate symptoms and progress in treatment             -- Patient's case to be discussed in multi-disciplinary team meeting             -- Observation Level: q15 minute checks             -- Vital signs:  q12 hours             -- Precautions: suicide, elopement, and assault   2. Psychiatric Diagnoses and Treatment: Schizoaffective disorder, bipolar type.   Abilify  5 mg BID Continue mirtazapine  45 mg at bedtime Olanzipine 5mg  PRN for agitation Continue Depakote     ER  750mg      -- The risks/benefits/side-effects/alternatives to this medication were discussed in detail with the patient and time was given for questions. The patient consents to medication trial.                -- Metabolic profile and EKG monitoring obtained while on an atypical antipsychotic (BMI: Lipid Panel: HbgA1c: QTc:)              -- Encouraged patient to participate in unit milieu and in scheduled group therapies                            3. Medical Issues Being Addressed:   Continue amlodipine  10 mg once daily and metoprolol  50 mg every 8 hours for hypertension   4. Discharge Planning:   -- Social work and case management to assist  with discharge planning and identification of hospital follow-up needs prior to discharge  -- Estimated LOS: 3-4 days  Lauraine Clause, Student-PA 03/04/2024, 1:29 PM

## 2024-03-04 NOTE — Progress Notes (Addendum)
 Pt is saying that the is coughing up mucus and requests a nebulizer treatment. Assessed pt lungs. No wheezing noted. Pt does say she feels short of breath when she ambulated to the bathroom the last time. VS wnl. Sent message to pharmacy and asked for an albuterol  treatment to be sent to unit.

## 2024-03-04 NOTE — Group Note (Signed)
 Date:  03/04/2024 Time:  11:03 AM  Group Topic/Focus:  Movement therapy    Participation Level:  Active  Participation Quality:  Appropriate  Affect:  Appropriate  Cognitive:  Appropriate  Insight: Appropriate  Engagement in Group:  Engaged  Modes of Intervention:  Activity  Additional Comments:  none  Ritu Gagliardo CHRISTELLA Cassis 03/04/2024, 11:03 AM

## 2024-03-04 NOTE — Plan of Care (Signed)

## 2024-03-04 NOTE — Group Note (Signed)
 LCSW Group Therapy Note  Group Date: 03/04/2024 Start Time: 1305 End Time: 1345   Type of Therapy and Topic:  Group Therapy: Anger Cues and Responses  Participation Level:  Did Not Attend   Description of Group:   In this group, patients learned how to recognize the physical, cognitive, emotional, and behavioral responses they have to anger-provoking situations.  They identified a recent time they became angry and how they reacted.  They analyzed how their reaction was possibly beneficial and how it was possibly unhelpful.  The group discussed a variety of healthier coping skills that could help with such a situation in the future.  Focus was placed on how helpful it is to recognize the underlying emotions to our anger, because working on those can lead to a more permanent solution as well as our ability to focus on the important rather than the urgent.  Therapeutic Goals: Patients will remember their last incident of anger and how they felt emotionally and physically, what their thoughts were at the time, and how they behaved. Patients will identify how their behavior at that time worked for them, as well as how it worked against them. Patients will explore possible new behaviors to use in future anger situations. Patients will learn that anger itself is normal and cannot be eliminated, and that healthier reactions can assist with resolving conflict rather than worsening situations.  Summary of Patient Progress:   X  Therapeutic Modalities:   Cognitive Behavioral Therapy    Sherryle JINNY Margo, LCSW 03/04/2024  1:44 PM

## 2024-03-04 NOTE — Progress Notes (Signed)
   03/04/24 1400  Psych Admission Type (Psych Patients Only)  Admission Status Involuntary  Psychosocial Assessment  Patient Complaints None  Eye Contact Fair  Facial Expression Animated  Affect Preoccupied  Speech Logical/coherent  Interaction Assertive  Motor Activity Slow  Appearance/Hygiene In scrubs  Behavior Characteristics Cooperative  Mood Pleasant  Thought Process  Coherency Disorganized  Content WDL  Delusions None reported or observed  Perception WDL  Hallucination None reported or observed  Judgment Impaired  Confusion Mild  Danger to Self  Current suicidal ideation? Denies  Agreement Not to Harm Self Yes  Description of Agreement verbal  Danger to Others  Danger to Others None reported or observed

## 2024-03-04 NOTE — Progress Notes (Signed)
 Pt completed nebulizer treatment. Reassessed lung sounds. Clear in all fields.

## 2024-03-04 NOTE — Progress Notes (Signed)
   03/04/24 2140  Psych Admission Type (Psych Patients Only)  Admission Status Involuntary  Psychosocial Assessment  Patient Complaints None  Eye Contact Fair  Facial Expression Animated  Affect Preoccupied  Speech Logical/coherent  Interaction Assertive  Motor Activity Slow  Appearance/Hygiene In scrubs  Behavior Characteristics Appropriate to situation;Cooperative  Mood Pleasant  Thought Process  Coherency Disorganized  Content WDL  Delusions None reported or observed  Perception WDL  Hallucination None reported or observed  Judgment Impaired  Confusion Mild  Danger to Self  Current suicidal ideation? Denies  Agreement Not to Harm Self Yes  Danger to Others  Danger to Others None reported or observed

## 2024-03-04 NOTE — Progress Notes (Signed)
(  Sleep Hours) - 5.75 (Any PRNs that were needed, meds refused, or side effects to meds)- albuterol  nebulizer treatment (pt reported SOB and mucus); tramadol  50 mg for HA and pain 10/10 (Any disturbances and when (visitation, over night)- none (Concerns raised by the patient)- pt stated she was coughing up mucus that was yellow and thick - upon inspection it is thin, clear and watery. Pt wanted a breathing treatment so one was administered (SI/HI/AVH)- denies

## 2024-03-05 MED ORDER — ARIPIPRAZOLE 5 MG PO TABS: 5.0000 mg | ORAL_TABLET | Freq: Two times a day (BID) | ORAL | 0 refills | Status: AC

## 2024-03-05 MED ORDER — MIRTAZAPINE 45 MG PO TABS: 45.0000 mg | ORAL_TABLET | Freq: Every day | ORAL | 0 refills | Status: AC

## 2024-03-05 MED ORDER — DIVALPROEX SODIUM ER 250 MG PO TB24: 750.0000 mg | ORAL_TABLET | Freq: Every day | ORAL | 0 refills | Status: AC

## 2024-03-05 MED ORDER — GABAPENTIN 300 MG PO CAPS: 300.0000 mg | ORAL_CAPSULE | Freq: Three times a day (TID) | ORAL | 0 refills | Status: AC

## 2024-03-05 NOTE — Group Note (Signed)
 Recreation Therapy Group Note   Group Topic:Animal Assisted Therapy   Group Date: 03/05/2024 Start Time: 1035 End Time: 1100 Facilitators: Celestia Jeoffrey BRAVO, LRT, CTRS Location: Courtyard  Group Description: AAA. Animal-Assisted Activity provides opportunities for motivational, educational, therapeutic and/or recreational benefits to enhance quality of life. Selinda and Rollo visited the unit to interact with patients.   Goal Areas Addressed:  Reduced anxiety and stress Improved mood Increased social interaction Enhanced communication skills Reduced loneliness and isolation Improved emotional regulation   Affect/Mood: N/A   Participation Level: Did not attend    Clinical Observations/Individualized Feedback: Patient did not attend group.   Plan: Continue to engage patient in RT group sessions 2-3x/week.   Jeoffrey BRAVO Celestia, LRT, CTRS 03/05/2024 1:32 PM

## 2024-03-05 NOTE — [facility] Suicide Risk Assessment (Signed)
 [facility] Discharge Suicide Risk Assessment   Principal Problem: Schizoaffective disorder, bipolar type [facility]) Discharge Diagnoses: Principal Problem:   Schizoaffective disorder, bipolar type (HCC)   Total Time spent with patient: 30 minutes  Musculoskeletal: Strength & Muscle Tone: within normal limits Gait & Station: normal Patient leans: N/A  Psychiatric Specialty Exam  Presentation  General Appearance:  Appropriate for Environment; Casual  Eye Contact: Fair  Speech: Clear and Coherent  Speech Volume: Normal  Handedness: Right   Mood and Affect  Mood: Euthymic  Duration of Depression Symptoms: No data recorded Affect: Appropriate   Thought Process  Thought Processes: Coherent  Descriptions of Associations:Intact  Orientation:Full (Time, Place and Person)  Thought Content:Logical  History of Schizophrenia/Schizoaffective disorder:Yes  Duration of Psychotic Symptoms:No data recorded Hallucinations:Hallucinations: None  Ideas of Reference:None  Suicidal Thoughts:Suicidal Thoughts: No  Homicidal Thoughts:Homicidal Thoughts: No   Sensorium  Memory: Immediate Fair; Recent Fair; Remote Fair  Judgment: Fair  Insight: Fair   Art therapist  Concentration: Fair  Attention Span: Fair  Recall: [employer] of Knowledge: Fair  Language: Fair   Psychomotor Activity  Psychomotor Activity: Psychomotor Activity: Normal   Assets  Assets: Communication Skills; Desire for Improvement   Sleep  Sleep: Sleep: Fair  Estimated Sleeping Duration (Last 24 Hours): 4.25-4.50 hours  Physical Exam: Physical Exam ROS Blood pressure (!) 151/76, pulse 91, temperature 97.6 F (36.4 C), temperature source Temporal, resp. rate 17, height 5' 7 (1.702 m), weight 121.6 kg, SpO2 98%. Body mass index is 41.97 kg/m.  Mental Status Per Nursing Assessment::   On Admission:  NA  Demographic Factors:  Low socioeconomic status  Loss  Factors: Decrease in vocational status  Historical Factors: Impulsivity  Risk Reduction Factors:   Living with another person, especially a relative, Positive social support, Positive therapeutic relationship, and Positive coping skills or problem solving skills  Continued Clinical Symptoms:  Depression:   Impulsivity  Cognitive Features That Contribute To Risk:  None    Suicide Risk:  Minimal: No identifiable suicidal ideation.  Patients presenting with no risk factors but with morbid ruminations; may be classified as minimal risk based on the severity of the depressive symptoms   Follow-up Information     [facility] Follow up.   Why: Referral for ACTT has been submitted. Contact information: [address] [place] [address] [phone]         [facility] Follow up.   Why: Referral for ACTT has been submitted. Contact information: [address] [place] [address] [phone]         [facility] Follow up.   Why: Referral for ACTT has been submitted. Contact information: [address] [place] [address] [phone]         [facility] Follow up.   Why: Referral for ACTT has been submitted. Contact information: [address]  [address] [place] [address] [phone]                 Plan Of Care/Follow-up recommendations:  Activity:  As tolerated  Luccia Reinheimer, MD 03/05/2024, 9:53 AM

## 2024-03-05 NOTE — Group Note (Signed)
 [facility] LCSW Group Therapy Note   Group Date: 03/05/2024 Start Time: 1330 End Time: 1400   Type of Therapy/Topic:  Group Therapy:  Emotion Regulation  Participation Level:  Active   Mood:  Description of Group:    The purpose of this group is to assist patients in learning to regulate negative emotions and experience positive emotions. Patients will be guided to discuss ways in which they have been vulnerable to their negative emotions. These vulnerabilities will be juxtaposed with experiences of positive emotions or situations, and patients challenged to use positive emotions to combat negative ones. Special emphasis will be placed on coping with negative emotions in conflict situations, and patients will process healthy conflict resolution skills.  Therapeutic Goals: Patient will identify two positive emotions or experiences to reflect on in order to balance out negative emotions:  Patient will label two or more emotions that they find the most difficult to experience:  Patient will be able to demonstrate positive conflict resolution skills through discussion or role plays:   Summary of Patient Progress:   Pt was active and appropriate throughout group    Therapeutic Modalities:   Cognitive Behavioral Therapy Feelings Identification Dialectical Behavioral Therapy   [name], LCSWA

## 2024-03-05 NOTE — Group Note (Signed)
 Date:  03/05/2024 Time:  3:39 AM  Group Topic/Focus:  Making Healthy Choices:   The focus of this group is to help patients identify negative/unhealthy choices they were using prior to admission and identify positive/healthier coping strategies to replace them upon discharge.    Participation Level:  Did Not Attend  Participation Quality:  n/a  Affect:  n/a  Cognitive:  n/a  Insight: None  Engagement in Group:  None  Modes of Intervention:  n/a  Additional Comments:    Leigh VEAR Pais 03/05/2024, 3:39 AM

## 2024-03-05 NOTE — Progress Notes (Signed)
  [facility] Adult Case Management Discharge Plan :  Will you be returning to the same living situation after discharge:  Yes,  pt will return home  At discharge, do you have transportation home?: Yes,  pt's son will pick her up  Do you have the ability to pay for your medications: Yes,  [employer] MEDICARE / [facility] MEDICARE  Release of information consent forms completed and in the chart;  Patient's signature needed at discharge.  Patient to Follow up at:  Follow-up Information     [facility] Follow up.   Why: Referral for ACTT has been submitted. Contact information: [address] [place] [address] [phone]         [facility] Follow up.   Why: Referral for ACTT has been submitted. Contact information: [address] [place] [address] [phone]         [facility] Follow up.   Why: Referral for ACTT has been submitted. Contact information: [address] [place] [address] [phone]         [facility] Follow up.   Why: Referral for ACTT has been submitted. Contact information: [address]  [address] [place] [address] [phone]         [facility] Go on 03/12/2024.   Why: Your appointment is scheduled for 03/12/24 at 2:20 PM. Please remember to bring your insurance card and ID. Contact information: [address] [place] [address] [phone]                 Next level of care provider has access to [facility] Link:no  Safety Planning and Suicide Prevention discussed: Yes,  Jelani Gray, [phone]     Has patient been referred to the Quitline?: Patient does not use tobacco/nicotine  products  Patient has been referred for addiction treatment: No known substance use disorder.  [address], LCSWA 03/05/2024, 10:08 AM

## 2024-03-05 NOTE — Care Management Important Message (Signed)
 Important Message  Patient Details  Name: Heidi Young MRN: [id number] Date of Birth: [birth year]   Important Message Given:  Yes - Medicare IM     [name], LCSWA 03/05/2024, 10:12 AM

## 2024-03-05 NOTE — Progress Notes (Signed)
   03/04/24 1525  Spiritual Encounters  Type of Visit Follow up  Care provided to: Patient  Referral source Chaplain assessment  Reason for visit Routine spiritual support   Following spirituality group time, I offered brief follow up support to Ms. Comer Rosin.  Ms. Gaulin shared more about her personal background and achievements as a Engineer, civil (consulting) in [place], [place]. She expanded on group comments regarding her values and personality and beliefs (hospitality and kindness to others). She shared ways that she has and has not at times felt she received that on the unit. She expressed positive outlook overall and readiness for discharge.  I provided compasionate, non-anxious presence and active listening. I affirmed Ms. Coltrane's achievements and appreciation for her gifts and what she brings to the group. I affirmed her faith and values. I offered advocacy and encouragement to make her voice heard regarding any dissatisfaction in her care. I offered relational support and gratitude; will seek to follow up 9/18 and offer prayer prior to discharge if desired.  Burlene Montecalvo L. Delores HERO.Div

## 2024-03-05 NOTE — Group Note (Signed)
 Date:  03/05/2024 Time:  1:09 PM  Group Topic/Focus:  Wellness Toolbox:   The focus of this group is to discuss various aspects of wellness, balancing those aspects and exploring ways to increase the ability to experience wellness.  Patients will create a wellness toolbox for use upon discharge.    Participation Level:  Did Not Attend   [facility] 03/05/2024, 1:09 PM

## 2024-03-05 NOTE — Discharge Summary (Signed)
 Physician Discharge Summary Note  Patient:  Heidi Young is an 78 y.o., female MRN:  [id number] DOB:  1946-02-26 Patient phone:  [phone] (home)  Patient address:   [address] [place] [address],   Total time spent: 40 min Date of Admission:  02/24/2024 Date of Discharge: 03/05/24  Reason for Admission:  Heidi Young is a 78 y.o. female admitted: Presented to the ED by [facility] 02/18/2024 10:38 PM for IVC. She carries the psychiatric diagnoses of Schizoaffective disorder with paranoid delusions. She meets criteria for schizophrenia based on a history of fixed false beliefs (e.g., voodoo-related delusions) and ongoing psychotic symptoms that have caused functional impairment over an extended period. Patient is admitted to [facility] unit with Q15 min safety monitoring. Multidisciplinary team approach is offered. Medication management; group/milieu therapy is offered.   Principal Problem: Schizoaffective disorder, bipolar type [facility]) Discharge Diagnoses: Principal Problem:   Schizoaffective disorder, bipolar type (HCC)   Past Psychiatric History: see h&p  Family Psychiatric  History: see h&p Social History:  Social History   Substance and Sexual Activity  Alcohol Use No   Comment: denies     Social History   Substance and Sexual Activity  Drug Use Yes   Frequency: 1.0 times per week   Types: Marijuana   Comment: due to her chronic pain- last use 03/22/2013    Social History   Socioeconomic History   Marital status: Divorced    Spouse name: Not on file   Number of children: Not on file   Years of education: Not on file   Highest education level: Not on file  Occupational History   Occupation: disability  Tobacco Use   Smoking status: Never    Passive exposure: Never   Smokeless tobacco: Not on file  Vaping Use   Vaping status: Not on file  Substance and Sexual Activity   Alcohol use: No    Comment: denies   Drug use: Yes    Frequency: 1.0 times  per week    Types: Marijuana    Comment: due to her chronic pain- last use 03/22/2013   Sexual activity: Never    Birth control/protection: Abstinence  Other Topics Concern   Not on file  Social History Narrative   Not on file   Social Drivers of Health   Financial Resource Strain: Not on file  Food Insecurity: No Food Insecurity (02/24/2024)   Hunger Vital Sign    Worried About Running Out of Food in the Last Year: Never true    Ran Out of Food in the Last Year: Never true  Recent Concern: Food Insecurity - Food Insecurity Present (02/11/2024)   Hunger Vital Sign    Worried About Running Out of Food in the Last Year: Sometimes true    Ran Out of Food in the Last Year: Sometimes true  Transportation Needs: No Transportation Needs (02/24/2024)   PRAPARE - Administrator, Civil Service (Medical): No    Lack of Transportation (Non-Medical): No  Physical Activity: Not on file  Stress: Not on file  Social Connections: Moderately Isolated (02/24/2024)   Social Connection and Isolation Panel    Frequency of Communication with Friends and Family: More than three times a week    Frequency of Social Gatherings with Friends and Family: Never    Attends Religious Services: More than 4 times per year    Active Member of [employer] or Organizations: No    Attends Banker Meetings: Never  Marital Status: Divorced   Past Medical History:  Past Medical History:  Diagnosis Date   Asthma    Bipolar 1 disorder (HCC)    Cancer (HCC)    renal cell carcinoma   Cardiomyopathy    Carotid bruit    left   Chest pain    Chronic anemia    DJD (degenerative joint disease)    Fibromyalgia    GERD (gastroesophageal reflux disease)    on rare occas. - uses peptobismol   Heart murmur    History of kidney cancer    unknown type. had R kidney removed b/c of CA.    HLD (hyperlipidemia)    HTN (hypertension)    Kidney stones    Lumbar herniated disc    Obesity    Psychosis (HCC)     Schizophrenia (HCC)    Single kidney    Takotsubo cardiomyopathy    UTI (lower urinary tract infection)     Past Surgical History:  Procedure Laterality Date   BREAST BIOPSY Right 10/24/2022   US  RT BREAST BX W LOC DEV 1ST LESION IMG BX SPEC US  GUIDE 10/24/2022 [facility] MAMMOGRAPHY   BREAST LUMPECTOMY  90's   left, cyst   CARDIAC CATHETERIZATION  2010   no intervention   CESAREAN SECTION  1980   x1   CYSTECTOMY  90's   back (left)   NEPHRECTOMY  1990's   right, secondary to cyst   TOTAL KNEE ARTHROPLASTY  2005   right   TOTAL KNEE ARTHROPLASTY Left 03/25/2013   Procedure: TOTAL KNEE ARTHROPLASTY;  Surgeon: Norleen LITTIE Gavel, MD;  Location: [facility] OR;  Service: Orthopedics;  Laterality: Left;   Family History:  Family History  Problem Relation Age of Onset   Coronary artery disease Mother 11   Heart failure Mother        congestive   Stroke Mother    Brain cancer Father 80   Hypertension Other        siblings   Aortic stenosis Other        sibling    Hospital Course:  Heidi Young is a 78 y.o. female admitted: Presented to the ED by [facility] 02/18/2024 10:38 PM for IVC. She carries the psychiatric diagnoses of Schizoaffective disorder with paranoid delusions. She meets criteria for schizophrenia based on a history of fixed false beliefs (e.g., voodoo-related delusions) and ongoing psychotic symptoms that have caused functional impairment over an extended period. Patient is admitted to [facility] unit with Q15 min safety monitoring. Multidisciplinary team approach is offered. Medication management; group/milieu therapy is offered.  Detailed risk assessment is complete based on clinical exam and individual risk factors and acute suicide risk is low and acute violence risk is low.    On admission, patient was noted to be very manic.  Patient was started on mood stabilizer Depakote  and Abilify  optimized to 5 mg twice daily.  Seroquel  XL was discontinued for poor response.  Patient tolerated  medications very well with no reported side effects.  Patient maintained safe behaviors on the unit.  On the day of discharge she consistently denied SI/HI/plan and denied hallucinations. Currently, all modifiable risk of harm to self/harm to others have been addressed and patient is no longer appropriate for the acute inpatient setting and is able to continue treatment for mental health needs in the community with the supports as indicated below.  Patient is educated and verbalized understanding of discharge plan of care including medications, follow-up appointments, mental health resources and  further crisis services in the community.  He is instructed to call 911 or present to the nearest emergency room should he experience any decompensation in mood, disturbance of bowel or return of suicidal/homicidal ideations.  Patient verbalizes understanding of this education and agrees to this plan of care  Physical Findings: AIMS:  , ,  ,  ,    CIWA:    COWS:        Psychiatric Specialty Exam:  Presentation  General Appearance:  Appropriate for Environment; Casual  Eye Contact: Fair  Speech: Clear and Coherent  Speech Volume: Normal    Mood and Affect  Mood: Euthymic  Affect: Appropriate   Thought Process  Thought Processes: Coherent  Descriptions of Associations:Intact  Orientation:Full (Time, Place and Person)  Thought Content:Logical  Hallucinations:Hallucinations: None  Ideas of Reference:None  Suicidal Thoughts:Suicidal Thoughts: No  Homicidal Thoughts:Homicidal Thoughts: No   Sensorium  Memory: Immediate Fair; Recent Fair; Remote Fair  Judgment: Fair  Insight: Fair   Art therapist  Concentration: Fair  Attention Span: Fair  Recall: [employer] of Knowledge: Fair  Language: Fair   Psychomotor Activity  Psychomotor Activity: Psychomotor Activity: Normal  Musculoskeletal: Strength & Muscle Tone: decreased Gait & Station:  unsteady Assets  Assets: Manufacturing systems engineer; Desire for Improvement   Sleep  Sleep: Sleep: Fair    Physical Exam: Physical Exam Vitals and nursing note reviewed.    ROS Blood pressure (!) 151/76, pulse 91, temperature 97.6 F (36.4 C), temperature source Temporal, resp. rate 17, height 5' 7 (1.702 m), weight 121.6 kg, SpO2 98%. Body mass index is 41.97 kg/m.   Social History   Tobacco Use  Smoking Status Never   Passive exposure: Never  Smokeless Tobacco Not on file   Tobacco Cessation:  N/A, patient does not currently use tobacco products   Blood Alcohol level:  Lab Results  Component Value Date   [facility] <11 03/11/2014   ETH <11 03/09/2014    Metabolic Disorder Labs:  Lab Results  Component Value Date   HGBA1C  03/04/2009    5.1 (NOTE) The ADA recommends the following therapeutic goal for glycemic control related to Hgb A1c measurement: Goal of therapy: <6.5 Hgb A1c  Reference: American Diabetes Association: Clinical Practice Recommendations 2010, Diabetes Care, 2010, 33: (Suppl  1).   MPG 100 03/04/2009   No results found for: PROLACTIN Lab Results  Component Value Date   CHOL 211 (H) 09/14/2009   TRIG 70.0 09/14/2009   HDL 64.80 09/14/2009   CHOLHDL 3 09/14/2009   VLDL 14.0 09/14/2009   LDLCALC (H) 03/04/2009    145        Total Cholesterol/HDL:CHD Risk Coronary Heart Disease Risk Table                     Men   Women  1/2 Average Risk   3.4   3.3  Average Risk       5.0   4.4  2 X Average Risk   9.6   7.1  3 X Average Risk  23.4   11.0        Use the calculated Patient Ratio above and the CHD Risk Table to determine the patient's CHD Risk.        ATP III CLASSIFICATION (LDL):  <100     mg/dL   Optimal  899-870  mg/dL   Near or Above  Optimal  130-159  mg/dL   Borderline  839-810  mg/dL   High  >809     mg/dL   Very High    See Psychiatric Specialty Exam and Suicide Risk Assessment completed by Attending Physician  prior to discharge.  Discharge destination:  Home  Is patient on multiple antipsychotic therapies at discharge:  No   Has Patient had three or more failed trials of antipsychotic monotherapy by history:  No  Recommended Plan for Multiple Antipsychotic Therapies: NA   Allergies as of 03/05/2024       Reactions   Bee Venom Anaphylaxis   Other Anaphylaxis   Red ant's, roaches    Avelox [moxifloxacin Hydrochloride] Rash, Other (See Comments)   Fever / Upset Stomach   Nsaids Other (See Comments)   Due to stomach ulcers   Ciprofloxacin Other (See Comments)   Upset stomach        Medication List     STOP taking these medications    acetaminophen  325 MG tablet Commonly known as: TYLENOL    potassium chloride  SA 20 MEQ tablet Commonly known as: KLOR-CON  M   SEROquel  XR 150 MG 24 hr tablet Generic drug: QUEtiapine  Fumarate   tiZANidine  4 MG tablet Commonly known as: ZANAFLEX        TAKE these medications      Indication  albuterol  108 (90 Base) MCG/ACT inhaler Commonly known as: VENTOLIN  HFA Inhale 2 puffs into the lungs 5 (five) times daily as needed for wheezing or shortness of breath.    albuterol  (2.5 MG/3ML) 0.083% nebulizer solution Commonly known as: PROVENTIL  Take 2.5 mg by nebulization every 4 (four) hours as needed for wheezing or shortness of breath.    amLODipine  10 MG tablet Commonly known as: NORVASC  Take 10 mg by mouth daily.    ARIPiprazole  5 MG tablet Commonly known as: ABILIFY  Take 1 tablet (5 mg total) by mouth 2 (two) times daily.  Indication: Manic Phase of Manic-Depression   atorvastatin  20 MG tablet Commonly known as: LIPITOR Take 20 mg by mouth at bedtime.    bisacodyl  10 MG suppository Commonly known as: DULCOLAX Place 1 suppository (10 mg total) rectally daily as needed for severe constipation (severe).    Constulose  10 GM/15ML solution Generic drug: lactulose  Take 20 g by mouth daily as needed for moderate constipation.     divalproex  250 MG 24 hr tablet Commonly known as: DEPAKOTE  ER Take 3 tablets (750 mg total) by mouth daily. Start taking on: March 06, 2024  Indication: Manic Phase of Manic-Depression   EpiPen 0.3 mg/0.3 mL Soaj injection Generic drug: EPINEPHrine Inject 0.3 mg into the muscle as needed (allergic reaction).    folic acid  1 MG tablet Commonly known as: FOLVITE  Take 1 mg by mouth daily.    furosemide  40 MG tablet Commonly known as: LASIX  Take 40 mg by mouth daily as needed (for fluid retention.).    gabapentin  300 MG capsule Commonly known as: NEURONTIN  Take 1 capsule (300 mg total) by mouth every 8 (eight) hours. What changed: when to take this  Indication: Abuse or Misuse of Alcohol   levocetirizine 5 MG tablet Commonly known as: XYZAL Take 5 mg by mouth every evening.    meclizine  25 MG tablet Commonly known as: ANTIVERT  Take 1 tablet (25 mg total) by mouth 3 (three) times daily as needed. What changed: reasons to take this    melatonin 3 MG Tabs tablet Take 3 mg by mouth at bedtime.    metoprolol  tartrate  50 MG tablet Commonly known as: LOPRESSOR  Take 50 mg by mouth 3 (three) times daily.    mirabegron  ER 25 MG Tb24 tablet Commonly known as: Myrbetriq  Take 1 tablet (25 mg total) by mouth daily as needed (for urinary frequency with lasix ).    mirtazapine  45 MG tablet Commonly known as: REMERON  Take 1 tablet (45 mg total) by mouth at bedtime.  Indication: Major Depressive Disorder   mometasone -formoterol  100-5 MCG/ACT Aero Commonly known as: DULERA  Inhale 2 puffs into the lungs 2 (two) times daily as needed for wheezing or shortness of breath.    ondansetron  4 MG disintegrating tablet Commonly known as: ZOFRAN -ODT Take 4 mg by mouth every 6 (six) hours as needed for nausea or vomiting.    pantoprazole  40 MG tablet Commonly known as: PROTONIX  Take 1 tablet (40 mg total) by mouth daily at 6 (six) AM.    polyethylene glycol 17 g packet Commonly known  as: MIRALAX  / GLYCOLAX  Take 17 g by mouth 2 (two) times daily.    traMADol  50 MG tablet Commonly known as: ULTRAM  Take 50 mg by mouth 3 (three) times daily as needed (pain).    VITAMIN D  (CHOLECALCIFEROL ) PO Take 1 tablet by mouth daily.         Follow-up Information     [facility] Follow up.   Why: Referral for ACTT has been submitted. Contact information: [address] [place] [address] [phone]         [facility] Follow up.   Why: Referral for ACTT has been submitted. Contact information: [address] [place] [address] [phone]         [facility] Follow up.   Why: Referral for ACTT has been submitted. Contact information: [address] [place] [address] [phone]         [facility] Follow up.   Why: Referral for ACTT has been submitted. Contact information: [address]  [address] [place] [address] [phone]         [facility] Go on 03/12/2024.   Why: Your appointment is scheduled for 03/12/24 at 2:20 PM. Please remember to bring your insurance card and ID. Contact information: [address] [place] [address] [phone]                 Follow-up recommendations:  Activity:  As tolerated    Signed: Kevontay Burks, MD 03/05/2024, 12:28 PM

## 2024-03-05 NOTE — [facility] Counselor (Signed)
 CSW received call from [facility], pt is not eligible due to her insurance. They do not accept Medicare.  Sherryle Margo, MSW, LCSW 03/05/2024 8:37 AM

## 2024-03-05 NOTE — Plan of Care (Signed)
 Patient escorted by [facility] medical mall where her son was waiting to take her home. Patient given her belongings, and discharge paperwork.  The towels and scrubs she was attempting to take home with her were taken out of her bag and placed in the dirty linens.

## 2024-03-05 NOTE — [facility] Group Notes (Signed)
 Spirituality Group   Description: Participant directed exploration of values, beliefs and meaning   Following a brief framework of chaplain's role and ground rules of group behavior, participants are invited to share concerns or questions that engage spiritual life. Emphasis placed on common themes and shared experiences and ways to make meaning and clarify living into one's values.   Theory/Process/Goal: Utilize the theoretical framework of group therapy established by Celena Kite, Relational Cultural Theory and Rogerian approaches to facilitate relational empathy and use of the "here and now" to foster reflection, self-awareness, and sharing.   Observations: [name] joined for the last .25 of the group. In that brief time she added meaningfully to the discussion. Her affect was bright and positive.  Heidi Young Heidi Young.Div

## 2024-07-10 ENCOUNTER — Encounter: Payer: Self-pay | Admitting: Internal Medicine

## 2024-07-10 ENCOUNTER — Other Ambulatory Visit: Payer: Self-pay | Admitting: Internal Medicine

## 2024-07-10 DIAGNOSIS — R921 Mammographic calcification found on diagnostic imaging of breast: Secondary | ICD-10-CM

## 2024-12-08 ENCOUNTER — Other Ambulatory Visit

## 2024-12-08 ENCOUNTER — Encounter
